# Patient Record
Sex: Male | Born: 1945
Health system: Southern US, Community
[De-identification: ages and names within clinical notes are randomized; demographics above are authoritative.]

## PROBLEM LIST (undated history)

## (undated) ENCOUNTER — Emergency Department (HOSPITAL_BASED_OUTPATIENT_CLINIC_OR_DEPARTMENT_OTHER): Admission: EM | Discharge status: Absent without leave | Payer: Medicare HMO | Source: Home / Self Care

## (undated) DIAGNOSIS — I2510 Atherosclerotic heart disease of native coronary artery without angina pectoris: Secondary | ICD-10-CM

## (undated) DIAGNOSIS — Z8719 Personal history of other diseases of the digestive system: Secondary | ICD-10-CM

## (undated) DIAGNOSIS — Z8709 Personal history of other diseases of the respiratory system: Secondary | ICD-10-CM

## (undated) DIAGNOSIS — H4910 Fourth [trochlear] nerve palsy, unspecified eye: Secondary | ICD-10-CM

## (undated) DIAGNOSIS — Z973 Presence of spectacles and contact lenses: Secondary | ICD-10-CM

## (undated) DIAGNOSIS — I10 Essential (primary) hypertension: Secondary | ICD-10-CM

## (undated) DIAGNOSIS — H4920 Sixth [abducent] nerve palsy, unspecified eye: Secondary | ICD-10-CM

## (undated) DIAGNOSIS — N529 Male erectile dysfunction, unspecified: Secondary | ICD-10-CM

## (undated) DIAGNOSIS — Z87442 Personal history of urinary calculi: Secondary | ICD-10-CM

## (undated) DIAGNOSIS — Z8619 Personal history of other infectious and parasitic diseases: Secondary | ICD-10-CM

## (undated) DIAGNOSIS — K219 Gastro-esophageal reflux disease without esophagitis: Secondary | ICD-10-CM

## (undated) DIAGNOSIS — N183 Chronic kidney disease, stage 3 unspecified: Secondary | ICD-10-CM

## (undated) DIAGNOSIS — K635 Polyp of colon: Secondary | ICD-10-CM

## (undated) DIAGNOSIS — Z859 Personal history of malignant neoplasm, unspecified: Secondary | ICD-10-CM

## (undated) DIAGNOSIS — F32A Depression, unspecified: Secondary | ICD-10-CM

## (undated) DIAGNOSIS — C4490 Unspecified malignant neoplasm of skin, unspecified: Secondary | ICD-10-CM

## (undated) DIAGNOSIS — C439 Malignant melanoma of skin, unspecified: Secondary | ICD-10-CM

## (undated) DIAGNOSIS — M5030 Other cervical disc degeneration, unspecified cervical region: Secondary | ICD-10-CM

## (undated) DIAGNOSIS — I779 Disorder of arteries and arterioles, unspecified: Secondary | ICD-10-CM

## (undated) DIAGNOSIS — I77810 Thoracic aortic ectasia: Secondary | ICD-10-CM

## (undated) DIAGNOSIS — Z9889 Other specified postprocedural states: Secondary | ICD-10-CM

## (undated) DIAGNOSIS — M159 Polyosteoarthritis, unspecified: Secondary | ICD-10-CM

## (undated) DIAGNOSIS — K449 Diaphragmatic hernia without obstruction or gangrene: Secondary | ICD-10-CM

## (undated) DIAGNOSIS — E785 Hyperlipidemia, unspecified: Secondary | ICD-10-CM

## (undated) DIAGNOSIS — C61 Malignant neoplasm of prostate: Secondary | ICD-10-CM

## (undated) DIAGNOSIS — N200 Calculus of kidney: Secondary | ICD-10-CM

## (undated) DIAGNOSIS — Z85820 Personal history of malignant melanoma of skin: Secondary | ICD-10-CM

## (undated) DIAGNOSIS — E291 Testicular hypofunction: Secondary | ICD-10-CM

## (undated) DIAGNOSIS — Z8546 Personal history of malignant neoplasm of prostate: Secondary | ICD-10-CM

## (undated) DIAGNOSIS — I4901 Ventricular fibrillation: Secondary | ICD-10-CM

## (undated) DIAGNOSIS — R001 Bradycardia, unspecified: Secondary | ICD-10-CM

## (undated) DIAGNOSIS — F329 Major depressive disorder, single episode, unspecified: Secondary | ICD-10-CM

## (undated) HISTORY — PX: OTHER SURGICAL HISTORY: SHX169

## (undated) HISTORY — DX: Malignant neoplasm of prostate: C61

## (undated) HISTORY — DX: Thoracic aortic ectasia: I77.810

## (undated) HISTORY — DX: Depression, unspecified: F32.A

## (undated) HISTORY — PX: SKIN CANCER EXCISION: SHX779

## (undated) HISTORY — PX: KIDNEY STONE SURGERY: SHX686

## (undated) HISTORY — PX: APPENDECTOMY: SHX54

## (undated) HISTORY — DX: Testicular hypofunction: E29.1

## (undated) HISTORY — DX: Sixth (abducent) nerve palsy, unspecified eye: H49.20

## (undated) HISTORY — DX: Unspecified malignant neoplasm of skin, unspecified: C44.90

## (undated) HISTORY — PX: TONSILLECTOMY: SUR1361

## (undated) HISTORY — DX: Chronic kidney disease, stage 3 unspecified: N18.30

## (undated) HISTORY — DX: Polyosteoarthritis, unspecified: M15.9

## (undated) HISTORY — DX: Bradycardia, unspecified: R00.1

## (undated) HISTORY — DX: Other cervical disc degeneration, unspecified cervical region: M50.30

## (undated) HISTORY — DX: Fourth (trochlear) nerve palsy, unspecified eye: H49.10

## (undated) HISTORY — DX: Disorder of arteries and arterioles, unspecified: I77.9

## (undated) HISTORY — DX: Calculus of kidney: N20.0

---
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
## 1969-03-24 HISTORY — PX: APPENDECTOMY: SHX54

## 2001-10-21 ENCOUNTER — Ambulatory Visit (HOSPITAL_COMMUNITY): Admission: RE | Admit: 2001-10-21 | Discharge: 2001-10-21 | Payer: Self-pay | Admitting: *Deleted

## 2001-10-21 ENCOUNTER — Encounter (INDEPENDENT_AMBULATORY_CARE_PROVIDER_SITE_OTHER): Payer: Self-pay

## 2005-11-09 ENCOUNTER — Encounter (INDEPENDENT_AMBULATORY_CARE_PROVIDER_SITE_OTHER): Payer: Self-pay | Admitting: *Deleted

## 2005-11-09 ENCOUNTER — Inpatient Hospital Stay (HOSPITAL_COMMUNITY): Admission: RE | Admit: 2005-11-09 | Discharge: 2005-11-10 | Payer: Self-pay | Admitting: Urology

## 2005-11-09 HISTORY — PX: ROBOT ASSISTED LAPAROSCOPIC RADICAL PROSTATECTOMY: SHX5141

## 2005-11-19 ENCOUNTER — Ambulatory Visit (HOSPITAL_COMMUNITY): Admission: EM | Admit: 2005-11-19 | Discharge: 2005-11-19 | Payer: Self-pay | Admitting: Emergency Medicine

## 2005-11-19 HISTORY — PX: OTHER SURGICAL HISTORY: SHX169

## 2007-07-25 DIAGNOSIS — K635 Polyp of colon: Secondary | ICD-10-CM

## 2007-07-25 HISTORY — DX: Polyp of colon: K63.5

## 2010-06-02 ENCOUNTER — Ambulatory Visit (HOSPITAL_COMMUNITY)
Admission: RE | Admit: 2010-06-02 | Discharge: 2010-06-02 | Payer: Self-pay | Source: Home / Self Care | Admitting: Urology

## 2010-06-02 HISTORY — PX: OTHER SURGICAL HISTORY: SHX169

## 2010-10-04 LAB — BASIC METABOLIC PANEL
BUN: 14 mg/dL (ref 6–23)
CO2: 24 mEq/L (ref 19–32)
Chloride: 109 mEq/L (ref 96–112)
Creatinine, Ser: 0.99 mg/dL (ref 0.4–1.5)
GFR calc Af Amer: >60 mL/min (ref >60)
GFR calc non Af Amer: >60 mL/min (ref >60)
Glucose, Bld: 89 mg/dL (ref 70–99)

## 2010-12-09 NOTE — Op Note (Signed)
NAME:  Jared Roberts, Jared Roberts NO.:  [id number]   MEDICAL RECORD NO.:  [id number]          PATIENT TYPE:  INP   LOCATION:  0002                         FACILITY:  [facility]   PHYSICIAN:  Heloise Purpura, MD      DATE OF BIRTH:  24-Apr-1946   DATE OF PROCEDURE:  11/09/2005  DATE OF DISCHARGE:                                 OPERATIVE REPORT   PREOPERATIVE DIAGNOSIS:  Clinically localized adenocarcinoma of the  prostate.   POSTOPERATIVE DIAGNOSIS:  Clinically localized adenocarcinoma of the  prostate.   PROCEDURE:  1.  Robotic-assisted laparoscopic radical prostatectomy (bilateral nerve-      sparing).  2.  Laparoscopic adhesiolysis   SURGEON:  Heloise Purpura, M.D.   ASSISTANT:  Bjorn Pippin, M.D.   ANESTHESIA:  General.   COMPLICATIONS:  None.   ESTIMATED BLOOD LOSS:  50 mL.   INTRAVENOUS FLUID:  1700 mL of lactated Ringer's.   SPECIMENS:  Prostate and seminal vesicles.   DRAINS:  1.  A 20-French coude catheter.  2.  A #19 Blake drain.   INDICATION:  Mr. Siedschlag is a 65 year old gentleman with clinical stage T2a  prostate cancer with a PSA of 3.0 and a Gleason score 3 + 3 = 6.  His  preoperative IPSS score was 5 with an IIES score of 25.  After discussing  options for clinically localized prostate cancer, the patient elected to  proceed with surgical removal.  Potential risks and benefits of the above  procedures were discussed with the patient and he consented.   DESCRIPTION OF PROCEDURE:  The patient was taken to the operating room and a  general anesthetic was administered.  He was given preoperative antibiotics,  placed in the dorsal lithotomy position, and prepped and draped in the usual  sterile fashion.  Next, a preoperative time-out was performed.  A Foley  catheter was then inserted into the bladder.  A site was selected  approximately 18 cm from the pubic symphysis and just to the left of the  umbilicus for placement of the camera port.  This was placed  using a  standard open Hasson technique.  This allowed entry into the peritoneal  cavity under direct vision.  A 12-mm port was then placed and a  pneumoperitoneum was established.  A 0-degree lens was then used to inspect  the abdomen.  There was no evidence of any intra-abdominal injuries.  There  were noted to be some fairly dense adhesions in the right lower quadrant  where the patient had a prior appendectomy.  These adhesions were not in no  way of the other port sites.  Therefore, attention was turned to placement  of the remaining ports.  Bilateral 8-mm ports were placed 16 cm from the  pubic symphysis and 10 cm lateral to the camera port.  An additional 8-mm  port was placed in the far left lateral abdominal wall.  A 5-mm port was  placed between the camera port and the right robotic port.  An additional 12-  mm port was placed in the far right lateral abdominal wall for  laparoscopic  assistance.  With laparoscopic scissors, the adhesions in the right lower  quadrant were carefully taken down from the abdominal wall, releasing the  colon.  The surgical cart was then docked.  With the aid of the hook  cautery, the bladder was reflected posteriorly, allowing entry in the space  of Retzius and identification of the endopelvic fascia and prostate.  The  endopelvic fascia was then incised from the apex back to the base of the  prostate bilaterally and the underlying levator muscle fibers were swept  laterally off the prostate.  This isolated the dorsal venous complex, which  was then stapled and divided with the 45-mm Flex ETS stapler.  The bladder  neck was then identified with the aid of Foley catheter manipulation.  The  bladder neck was entered anteriorly, allowing identification of the Foley  catheter.  The Foley catheter balloon was then deflated and the catheter was  brought into the operative field and used to retract the prostate  anteriorly.  The posterior bladder neck was  then incised.  There was noted  to be a small median lobe which was excised from the bladder neck.  Dissection continued posteriorly until the vasa deferentia and seminal  vesicles were identified.  The vasa deferentia were isolated and divided.  The seminal vesicles were isolated with care to control the seminal vesicle  arteries.  The seminal vesicles were then lifted anteriorly.  The space  between Denonvilliers' fascia and the anterior rectum was then bluntly  developed, thereby isolating the vascular pedicles of prostate.  Attention  was then turned to the anterior aspect of the prostate.  Using sharp cold  dissection, the lateral prostatic fascia was incised from the apex back to  the base of the prostate bilaterally, allowing the neurovascular bundles to  be swept laterally and posteriorly off the prostate.  The vascular pedicles  of the prostate were then ligated with Hem-o-lok clips and sharply divided.  The neurovascular bundles were preserved at the base of the prostate  bilaterally.  Attention was then turned to the urethra, which was sharply  divided, allowing the prostate to be disarticulated and placed up into the  abdominal cavity.  The pelvis was then copiously irrigated.  With irrigation  in the pelvis, air was injected into the rectal catheter and there was no  evidence of a rectal injury.  There was noted be a small arterial bleeding  site toward the distal aspect of the neurovascular bundle.  This site was  able to be controlled with a Ligaclip above the neurovascular bundle.  Attention was then turned to the urethral anastomosis.  A 2-0 Vicryl slip-  knot was placed at the 6 o'clock position between the bladder neck and  urethra and used to reapproximate these structures.  A double-armed 2-0  Monocryl suture was then used to perform a 360-degree tension-free running  anastomosis between the bladder neck and urethra.  A new 20-French coude catheter was then inserted  into the bladder and irrigated.  There were no  blood clots within the bladder and the anastomosis appeared watertight.  A  #19 Blake drain was then brought through the left robotic port and  appropriately positioned in the pelvis.  The surgical cart was then  undocked.  The Endopouch retrieval bag was placed through the periumbilical  port site to retrieve the prostate for later removal from the abdominal  cavity.  A 0 Vicryl stitch was placed through the right lateral 12-mm port  site fascia for port site closure.  The camera port incision was then  slightly extended inferiorly, allowing the prostate specimen to be removed  intact within the Endopouch retrieval bag.  This fascial opening was then  closed with a running 0 Vicryl suture.  0.25% Marcaine was then injected  into all skin incision sites, which were reapproximated at the skin level  with staples.  Sterile dressings were applied.  All sponge and needle counts  were correct x2 at the end of the procedure.  The patient appeared to  tolerate the procedure well and without complications.  He was able to be  extubated and transferred to the recovery unit in satisfactory condition.           ______________________________  Heloise Purpura, MD  Electronically Signed     LB/MEDQ  D:  11/09/2005  T:  11/10/2005  Job:  [id number]

## 2010-12-09 NOTE — Op Note (Signed)
NAME:  Jared Roberts, Jared Roberts NO.:  [id number]   MEDICAL RECORD NO.:  [id number]          PATIENT TYPE:  INP   LOCATION:  1420                         FACILITY:  [facility]   PHYSICIAN:  Courtney Paris, M.D.DATE OF BIRTH:  [birth year]   DATE OF PROCEDURE:  11/19/2005  DATE OF DISCHARGE:  11/10/2005                                 OPERATIVE REPORT   PREOPERATIVE DIAGNOSIS:  Clot urinary retention.  Postop radical  prostatectomy.   POSTOP DIAGNOSIS:  Clot urinary retention.  Postop radical prostatectomy.   OPERATIONS:  Cystoscopy, clot evacuation.   ANESTHESIA:  General.   SURGEON:  Courtney Paris, M.D.   This 65 year old patient had a radical robotic prostatectomy done by Dr.  Laverle Patter on 11/09/05 he had has catheter irrigated in the office about 6 days  ago but has continued to bleed off and on.  Came to the emergency room this  evening with his bladder full and the catheter was not draining.  I tried to  irrigate the clot but was unable to do so.  I chose to take him to the  operating room to do a clot evacuation for fear of trying not to disrupt the  recent urethral bladder and anastomosis.   The patient was placed on the operating table in dorsal lithotomy position.  After satisfactory induction of general anesthesia, he was prepped and  draped with Betadine in the usual sterile fashion.  I used the flexible  cystoscope first to examine the urethra.  The anterior urethra was normal.  The membranous urethra looked intact and the anastomosis actually looked  fairly good.  There was a little bit of clot adherent to it but there was a  large clot in the bladder.  Again looked at anastomosis.  It looked fairly  good though somewhat edematous.  I then removed the flexible cystoscope and  passed a #22 cystoscope under direct vision into the bladder and then was  able to evacuate the clot which was probably two large handfuls before I  finished.  This was done.  I  took another look with the cystoscope and the  orifices were intact.  The bladder neck was a little bit edematous but all  the clot was evacuated.  The scope was removed and #20 Foley catheter  replaced without difficulty and the irrigant was clear.  The patient was  taken to the recovery room in good condition to be later sent home today and  will see Dr. Laverle Patter tomorrow was scheduled.  Probably remove the catheter  tomorrow but I expect that he would have had passing that amount of clot  anyway.      Courtney Paris, M.D.  Electronically Signed    HMK/MEDQ  D:  11/19/2005  T:  11/20/2005  Job:  [id number]   cc:   Crecencio Mc, M.D.  Fax: [phone]

## 2010-12-09 NOTE — Procedures (Signed)
[facility]  Patient:    Jared Roberts, Jared Roberts Visit Number: [id number] MRN: [id number]          Service Type: END Location: ENDO Attending Physician:  Sabino Gasser Dictated by:   Sabino Gasser, M.D. Proc. Date: 10/21/01 Admit Date:  10/21/2001                             Procedure Report  PROCEDURE:  Upper endoscopy.  INDICATIONS:  Gastroesophageal reflux disease.  ANESTHESIA:  Demerol 50 mg, Versed 5 mg.  DESCRIPTION OF PROCEDURE:  With the patient mildly sedated in the left lateral decubitus position, the Olympus videoscopic endoscope was inserted in the mouth and passed under direct vision through the esophagus, which appeared normal. There was no evidence of Barretts. We entered into the stomach. Fundus, body, antrum, duodenal bulb, and second portion of the duodenum were visualized. From this point, the endoscope was slowly withdrawn taking circumferential views of the duodenal mucosa until the endoscope was then pulled back into the stomach and placed in retroflexion to view the stomach from below. The endoscope was then straightened and withdrawn taking circumferential views of the remaining gastric and esophageal mucosa stopping in the stomach where antral, body, and fundus mucosa was involved with an erythematous process consistent with a diffuse gastritis, which was photographed and biopsied. The patients vital signs and pulse oximeter remained stable. The patient tolerated the procedure well without apparent complications.  FINDINGS:  Changes of gastritis diffusely in stomach. Await biopsy report. The patient is to call Dr. Virginia Rochester for results and follow up with Dr. Virginia Rochester as an outpatient.  PLAN:  Proceed to colonoscopy as planned. Dictated by:   Sabino Gasser, M.D. Attending Physician:  Sabino Gasser DD:  10/21/01 TD:  10/21/01 Job: [id number]

## 2010-12-09 NOTE — Discharge Summary (Signed)
NAME:  Jared Roberts, Jared Roberts NO.:  [id number]   MEDICAL RECORD NO.:  [id number]          PATIENT TYPE:  INP   LOCATION:  1420                         FACILITY:  [facility]   PHYSICIAN:  Heloise Purpura, MD      DATE OF BIRTH:  11/09/1945   DATE OF ADMISSION:  11/09/2005  DATE OF DISCHARGE:  11/10/2005                                 DISCHARGE SUMMARY   ADMISSION DIAGNOSIS:  Clinically localized prostate cancer.   DISCHARGE DIAGNOSIS:  Clinically localized prostate cancer.   PROCEDURES:  Robotic-assisted laparoscopic radical prostatectomy.   HISTORY:  For full details, please see admission history and physical.  Briefly, Jared Roberts is a 65 year old gentleman with clinical stage T1-C  prostate cancer with a PSA of 3.0 and Gleason score of 3 + 3 = 6. After  discussing various management options for clinically localized prostate  cancer, the patient elected to proceed with surgical therapy and  particularly with robotic-assisted laparoscopic radical prostatectomy.   HOSPITAL COURSE:  The patient was admitted to the hospital on November 09, 2005. He was taken to the operating room and underwent a robotic-assisted  laparoscopic radical prostatectomy which was uneventful. Postoperatively, he  was able to be transferred to a regular hospital room following recovery  from anesthesia. Over the course of the next 24 hours, he was able to begin  ambulating which he did without difficulty. He was also able to begin a  clear liquid diet which he tolerated without problems. On the morning of  postoperative day #1, he had excellent urine output and minimal output from  his pelvic drain. His pelvic drain was therefore removed. He was able to be  transitioned to oral pain medication throughout the morning of postoperative  day #1 and met all discharge criteria by the afternoon of postoperative day  #1. He was therefore discharged in excellent condition.   DISPOSITION:  Home.   MEDICATIONS:   The patient was instructed to resume his regular home  medications excepting any aspirin, nonsteroidal anti-inflammatory drugs, or  herbal supplements for a period of 10 days. In addition, he was given a  prescription for Vicodin to take as needed for pain, Colace to use a stool  softener, and Cipro to begin one day prior to Foley catheter removal.   FOLLOW UP:  Jared Roberts has been scheduled to follow-up in approximately 7-10  days for removal of his Foley catheter, staple removal, and to go over his  surgical pathology in detail.           ______________________________  Heloise Purpura, MD  Electronically Signed     LB/MEDQ  D:  11/14/2005  T:  11/14/2005  Job:  [id number]   cc:   Soyla Murphy. Renne Crigler, M.D.  Fax: [phone]

## 2010-12-09 NOTE — H&P (Signed)
NAME:  [name], [name] NO.:  [id number]   MEDICAL RECORD NO.:  [id number]           PATIENT TYPE:   LOCATION:                                 FACILITY:   PHYSICIAN:  Courtney Paris, M.D.  DATE OF BIRTH:   DATE OF ADMISSION:  11/19/2005  DATE OF DISCHARGE:                                HISTORY & PHYSICAL   HISTORY OF PRESENT ILLNESS:  This 65 year old patient came to the emergency  room with obstructed urethral catheter nine days postoperatively radical  prostatectomy done robotically by Dr. Laverle Patter on November 09, 2005.  Dr. Laverle Patter  apparently irrigated the catheter six days ago but it has been bleeding off  an on and has not drained at all overnight.  I tried to irrigate the  catheter in the emergency room. Could not do so and I thought the safest  thing to do would be to take him to the operating room and with flexible  cystoscope get into the bladder with the guidewire, make sure I do not  disrupt the anastomosis and try to replace the catheter and possibly insert  a suprapubic tube at this time.   PAST HISTORY:  1.  Previous kidney stones.  Few were removed by ureteroscopy, this has been      many years, so the last was 12 to 13 years ago.  2.  He had also had appendectomy and tonsils done.   ALLERGIES:  He has no allergies.   MEDICATIONS:  Verapamil, lisinopril, Advicor, and multivitamins.   REVIEW OF SYSTEMS:  Nonsmoker.  No heart or lung problems.  No GI  complaints.  No arthritis.  Twelve-system review otherwise negative.   SOCIAL HISTORY:  He is married. Has two children.  A son who lives in  [place] who is 44. He has two grandchildren and a step-daughter 52 who  lives in [place], [place].  His mother is 86.  His step-dad is 87.  His real  dad died in World War II.  He has two half-brothers in good health.  He does  have an uncle with prostate cancer.  He works as a Customer service manager for a  [employer] and the prostate cancer was picked  up by a mild rise in PSA  and a nodule felt by Dr. Annabell Howells.   PHYSICAL EXAMINATION:  VITAL SIGNS:  Temperature 97, blood pressure 125/74,  respirations 20, pulse 108.  GENERAL:  He is a middle-aged, gray-haired white male with a goatee, in no  acute distress.  HEENT:  Clear.  NECK:  Supple.  LYMPH NODES:  No inguinal, cervical or axillary adenopathy.  LUNGS:  Clear.  ABDOMEN:  Soft.  Scars from his recent laparoscopic surgery noted.  GU:  Penis normal, circumcised with Foley catheter in place, 20-French but  nothing draining.  Testes normal.  No tenderness.  RECTAL:  Not done.  EXTREMITIES:  Negative.  No edema.  Good distal pulses.   IMPRESSION:  Obstructed urethral catheter, nine days post prostatectomy.  Recommend cystoscopy and replacement of catheter and possible suprapubic  tube if necessary.  Courtney Paris, M.D.  Electronically Signed     HMK/MEDQ  D:  11/19/2005  T:  11/19/2005  Job:  [id number]

## 2010-12-09 NOTE — Procedures (Signed)
[facility]  Patient:    Jared Roberts, Jared Roberts Visit Number: [id number] MRN: [id number]          Service Type: END Location: ENDO Attending Physician:  Sabino Gasser Dictated by:   Sabino Gasser, M.D. Proc. Date: 10/21/01 Admit Date:  10/21/2001                             Procedure Report  PROCEDURE:  Colonoscopy.  INDICATION:  Colon polyps.  ANESTHESIA:  Demerol 40, Versed 4 mg.  DESCRIPTION OF PROCEDURE:  With the patient mildly sedated in the left lateral decubitus position, a rectal exam was performed which was unremarkable. Subsequently, the Olympus videoscopic colonoscope was inserted into the rectum and passed under direct vision to the cecum, identified by the ileocecal valve and appendiceal orifice.  Prep in this area was slightly suboptimal in that there was stool coating the cecal mucosa which could not be fully washed and suctioned, but no gross lesions were seen.  From this point, the colonoscope was slowly withdrawn, taking circumferential views of the entire colonic mucosa, stopping at 15 cm from the anal verge, at which point a small polyp was seen, photographed, and removed using hot biopsy forceps technique setting of 20-20 blended current.  The endoscope was then placed in retroflexion to view the anal canal from above, and hemorrhoids were seen and photographed. The endoscope was straightened and withdrawn.  The patients vital signs and pulse oximeter remained stable.  The patient tolerated the procedure well without apparent complications.  FINDINGS: 1. Small polyp at 15 cm. 2. Internal hemorrhoids. 3. Otherwise unremarkable examination.  PLAN:  We will have patient call me for results of biopsy and follow up with me as as outpatient.  See endoscopy note for further details. Dictated by:   Sabino Gasser, M.D. Attending Physician:  Sabino Gasser DD:  10/21/01 TD:  10/21/01 Job: [id number]

## 2010-12-09 NOTE — H&P (Signed)
NAME:  Jared Roberts, Jared Roberts NO.:  [id number]   MEDICAL RECORD NO.:  [id number]          PATIENT TYPE:  INP   LOCATION:  NA                           FACILITY:  [facility]   PHYSICIAN:  Heloise Purpura, MD      DATE OF BIRTH:  May 22, 1946   DATE OF ADMISSION:  11/09/2005  DATE OF DISCHARGE:                                HISTORY & PHYSICAL   CHIEF COMPLAINT:  Prostate cancer.   HISTORY:  Jared Roberts is a 65 year old gentleman with clinical stage T2A  prostate cancer with a PSA of 3.0 and a Gleason score of 3+3=6. This was  diagnosed on a prostate biopsy on August 11, 2005 which demonstrated 30% of  the left apex to be involved with adenocarcinoma. After a discussion  regarding management options, the patient elected to proceed with surgical  therapy.   PAST MEDICAL HISTORY:  1.  Hypertension.  2.  Hypercholesterolemia.  3.  History of urolithiasis.   PAST SURGICAL HISTORY:  1.  Ureteroscopic stone removal.  2.  Appendectomy.   MEDICATIONS:  1.  Advicor.  2.  Verapamil ER 250 mg p.o. daily.  3.  Lisinopril 20 mg p.o. daily.  4.  Aspirin 81 mg p.o. daily.   ALLERGIES:  No known drug allergies.   FAMILY HISTORY:  The patient's father was diagnosed with prostate cancer in  his 4s and supposedly died of complications related to prostate cancer.  There is nobody else in the family who had prostate cancer. Other medical  problems in the family include coronary artery disease, diabetes,  hypertension, and urolithiasis.   SOCIAL HISTORY:  The patient works as a Patent examiner. He is  married. He denies tobacco. He does occasionally drink alcohol.   PHYSICAL EXAMINATION:  CONSTITUTIONAL:  The patient is a well-nourished,  well-developed, age appropriate male in no acute distress.  LUNGS:  Clear bilaterally.  CARDIOVASCULAR:  Regular rate and rhythm without obvious murmurs.  ABDOMEN:  Soft, nontender, nondistended.  GU:  Prostate is approximately 50 grams without  distinct nodularity. There  is however, firmness in the area of the right base.   IMPRESSION:  Clinically localized adenocarcinoma of the prostate.   PLAN:  Jared Roberts will be admitted to the hospital and will undergo a robotic  assisted laparoscopic radical prostatectomy. Postoperatively, he will  undergo routine postoperative care.           ______________________________  Heloise Purpura, MD  Electronically Signed     LB/MEDQ  D:  11/09/2005  T:  11/09/2005  Job:  [id number]

## 2013-07-15 ENCOUNTER — Other Ambulatory Visit: Payer: Self-pay | Admitting: Dermatology

## 2013-07-24 HISTORY — PX: MELANOMA EXCISION: SHX5266

## 2014-12-02 ENCOUNTER — Other Ambulatory Visit (INDEPENDENT_AMBULATORY_CARE_PROVIDER_SITE_OTHER): Payer: Self-pay | Admitting: Otolaryngology

## 2014-12-02 DIAGNOSIS — R1310 Dysphagia, unspecified: Secondary | ICD-10-CM

## 2014-12-07 ENCOUNTER — Ambulatory Visit (HOSPITAL_COMMUNITY)
Admission: RE | Admit: 2014-12-07 | Discharge: 2014-12-07 | Disposition: A | Discharge status: Home / Self Care | Payer: Medicare Other | Source: Ambulatory Visit | Attending: Otolaryngology | Admitting: Otolaryngology

## 2014-12-07 DIAGNOSIS — J029 Acute pharyngitis, unspecified: Secondary | ICD-10-CM | POA: Diagnosis not present

## 2014-12-07 DIAGNOSIS — R1310 Dysphagia, unspecified: Secondary | ICD-10-CM | POA: Diagnosis not present

## 2015-07-28 DIAGNOSIS — H0014 Chalazion left upper eyelid: Secondary | ICD-10-CM | POA: Diagnosis not present

## 2015-07-30 DIAGNOSIS — B351 Tinea unguium: Secondary | ICD-10-CM | POA: Diagnosis not present

## 2015-07-30 DIAGNOSIS — L602 Onychogryphosis: Secondary | ICD-10-CM | POA: Diagnosis not present

## 2015-08-03 DIAGNOSIS — L609 Nail disorder, unspecified: Secondary | ICD-10-CM | POA: Diagnosis not present

## 2015-08-03 DIAGNOSIS — B351 Tinea unguium: Secondary | ICD-10-CM | POA: Diagnosis not present

## 2015-08-05 DIAGNOSIS — L821 Other seborrheic keratosis: Secondary | ICD-10-CM | POA: Diagnosis not present

## 2015-08-05 DIAGNOSIS — D225 Melanocytic nevi of trunk: Secondary | ICD-10-CM | POA: Diagnosis not present

## 2015-08-05 DIAGNOSIS — L603 Nail dystrophy: Secondary | ICD-10-CM | POA: Diagnosis not present

## 2015-08-05 DIAGNOSIS — D485 Neoplasm of uncertain behavior of skin: Secondary | ICD-10-CM | POA: Diagnosis not present

## 2015-08-05 DIAGNOSIS — L820 Inflamed seborrheic keratosis: Secondary | ICD-10-CM | POA: Diagnosis not present

## 2015-08-05 DIAGNOSIS — Z85828 Personal history of other malignant neoplasm of skin: Secondary | ICD-10-CM | POA: Diagnosis not present

## 2015-08-05 DIAGNOSIS — Z85820 Personal history of malignant melanoma of skin: Secondary | ICD-10-CM | POA: Diagnosis not present

## 2015-08-05 DIAGNOSIS — Z23 Encounter for immunization: Secondary | ICD-10-CM | POA: Diagnosis not present

## 2015-08-19 DIAGNOSIS — H43811 Vitreous degeneration, right eye: Secondary | ICD-10-CM | POA: Diagnosis not present

## 2015-08-25 DIAGNOSIS — D485 Neoplasm of uncertain behavior of skin: Secondary | ICD-10-CM | POA: Diagnosis not present

## 2015-08-25 DIAGNOSIS — D2372 Other benign neoplasm of skin of left lower limb, including hip: Secondary | ICD-10-CM | POA: Diagnosis not present

## 2015-08-25 DIAGNOSIS — C4372 Malignant melanoma of left lower limb, including hip: Secondary | ICD-10-CM | POA: Diagnosis not present

## 2015-09-02 ENCOUNTER — Encounter (HOSPITAL_BASED_OUTPATIENT_CLINIC_OR_DEPARTMENT_OTHER): Payer: Self-pay | Admitting: *Deleted

## 2015-09-02 DIAGNOSIS — Z8719 Personal history of other diseases of the digestive system: Secondary | ICD-10-CM | POA: Diagnosis not present

## 2015-09-02 DIAGNOSIS — R490 Dysphonia: Secondary | ICD-10-CM | POA: Diagnosis not present

## 2015-09-02 DIAGNOSIS — D492 Neoplasm of unspecified behavior of bone, soft tissue, and skin: Secondary | ICD-10-CM | POA: Diagnosis not present

## 2015-09-02 DIAGNOSIS — J387 Other diseases of larynx: Secondary | ICD-10-CM | POA: Diagnosis not present

## 2015-09-02 DIAGNOSIS — K219 Gastro-esophageal reflux disease without esophagitis: Secondary | ICD-10-CM | POA: Diagnosis not present

## 2015-09-02 DIAGNOSIS — J383 Other diseases of vocal cords: Secondary | ICD-10-CM | POA: Diagnosis not present

## 2015-09-02 DIAGNOSIS — Z01818 Encounter for other preprocedural examination: Secondary | ICD-10-CM | POA: Diagnosis not present

## 2015-09-03 ENCOUNTER — Encounter (HOSPITAL_BASED_OUTPATIENT_CLINIC_OR_DEPARTMENT_OTHER): Payer: Self-pay | Admitting: *Deleted

## 2015-09-03 ENCOUNTER — Other Ambulatory Visit: Payer: Self-pay | Admitting: Foot & Ankle Surgery

## 2015-09-03 DIAGNOSIS — C439 Malignant melanoma of skin, unspecified: Secondary | ICD-10-CM | POA: Diagnosis not present

## 2015-09-03 NOTE — H&P (Signed)
H&P will be completed by patients primary care doctor, Dr. Shelia Media.

## 2015-09-03 NOTE — Progress Notes (Signed)
NPO AFTER MN.  ARRIVE AT 0715.  NEEDS ISTAT AND EKG.  

## 2015-09-06 ENCOUNTER — Telehealth: Payer: Self-pay | Admitting: Hematology and Oncology

## 2015-09-06 NOTE — Telephone Encounter (Signed)
Left pt vm in ref to np appt. °

## 2015-09-07 ENCOUNTER — Encounter (HOSPITAL_BASED_OUTPATIENT_CLINIC_OR_DEPARTMENT_OTHER): Payer: Self-pay | Admitting: *Deleted

## 2015-09-07 ENCOUNTER — Encounter (HOSPITAL_BASED_OUTPATIENT_CLINIC_OR_DEPARTMENT_OTHER)
Admission: RE | Disposition: A | Discharge status: Home / Self Care | Payer: Self-pay | Source: Ambulatory Visit | Attending: Foot & Ankle Surgery

## 2015-09-07 ENCOUNTER — Ambulatory Visit (HOSPITAL_BASED_OUTPATIENT_CLINIC_OR_DEPARTMENT_OTHER)
Admission: RE | Admit: 2015-09-07 | Discharge: 2015-09-07 | Disposition: A | Discharge status: Home / Self Care | Payer: Medicare HMO | Source: Ambulatory Visit | Attending: Foot & Ankle Surgery | Admitting: Foot & Ankle Surgery

## 2015-09-07 ENCOUNTER — Other Ambulatory Visit: Payer: Self-pay | Admitting: Foot & Ankle Surgery

## 2015-09-07 ENCOUNTER — Ambulatory Visit (HOSPITAL_BASED_OUTPATIENT_CLINIC_OR_DEPARTMENT_OTHER): Payer: Medicare HMO | Admitting: Anesthesiology

## 2015-09-07 DIAGNOSIS — I10 Essential (primary) hypertension: Secondary | ICD-10-CM | POA: Insufficient documentation

## 2015-09-07 DIAGNOSIS — K449 Diaphragmatic hernia without obstruction or gangrene: Secondary | ICD-10-CM | POA: Diagnosis not present

## 2015-09-07 DIAGNOSIS — C4372 Malignant melanoma of left lower limb, including hip: Secondary | ICD-10-CM | POA: Insufficient documentation

## 2015-09-07 DIAGNOSIS — K219 Gastro-esophageal reflux disease without esophagitis: Secondary | ICD-10-CM | POA: Diagnosis not present

## 2015-09-07 DIAGNOSIS — Z79899 Other long term (current) drug therapy: Secondary | ICD-10-CM | POA: Diagnosis not present

## 2015-09-07 DIAGNOSIS — L97529 Non-pressure chronic ulcer of other part of left foot with unspecified severity: Secondary | ICD-10-CM | POA: Diagnosis not present

## 2015-09-07 DIAGNOSIS — D492 Neoplasm of unspecified behavior of bone, soft tissue, and skin: Secondary | ICD-10-CM | POA: Diagnosis not present

## 2015-09-07 HISTORY — DX: Gastro-esophageal reflux disease without esophagitis: K21.9

## 2015-09-07 HISTORY — DX: Male erectile dysfunction, unspecified: N52.9

## 2015-09-07 HISTORY — DX: Essential (primary) hypertension: I10

## 2015-09-07 HISTORY — DX: Diaphragmatic hernia without obstruction or gangrene: K44.9

## 2015-09-07 HISTORY — DX: Malignant melanoma of skin, unspecified: C43.9

## 2015-09-07 HISTORY — DX: Presence of spectacles and contact lenses: Z97.3

## 2015-09-07 HISTORY — DX: Personal history of urinary calculi: Z87.442

## 2015-09-07 HISTORY — DX: Personal history of other infectious and parasitic diseases: Z86.19

## 2015-09-07 HISTORY — DX: Personal history of other diseases of the digestive system: Z87.19

## 2015-09-07 HISTORY — DX: Other specified postprocedural states: Z85.9

## 2015-09-07 HISTORY — DX: Personal history of malignant neoplasm of prostate: Z85.46

## 2015-09-07 HISTORY — DX: Personal history of malignant melanoma of skin: Z85.820

## 2015-09-07 HISTORY — DX: Hyperlipidemia, unspecified: E78.5

## 2015-09-07 HISTORY — DX: Personal history of other diseases of the respiratory system: Z87.09

## 2015-09-07 HISTORY — DX: Other specified postprocedural states: Z98.890

## 2015-09-07 HISTORY — PX: AMPUTATION TOE: SHX6595

## 2015-09-07 LAB — POCT I-STAT, CHEM 8
BUN: 13 mg/dL (ref 6–20)
CALCIUM ION: 1.21 mmol/L (ref 1.13–1.30)
Chloride: 106 mmol/L (ref 101–111)
Creatinine, Ser: 0.9 mg/dL (ref 0.61–1.24)
Glucose, Bld: 97 mg/dL (ref 65–99)
HCT: 47 % (ref 39.0–52.0)
Hemoglobin: 16 g/dL (ref 13.0–17.0)
Potassium: 3.5 mmol/L (ref 3.5–5.1)
SODIUM: 142 mmol/L (ref 135–145)
TCO2: 23 mmol/L (ref 0–100)

## 2015-09-07 SURGERY — AMPUTATION, TOE
Anesthesia: Monitor Anesthesia Care | Laterality: Left

## 2015-09-07 MED ORDER — LACTATED RINGERS IV SOLN
INTRAVENOUS | Status: DC
  Administered 2015-09-07: 08:00:00 via INTRAVENOUS
  Filled 2015-09-07: qty 1000

## 2015-09-07 MED ORDER — CEFAZOLIN SODIUM-DEXTROSE 2-3 GM-% IV SOLR
2.0000 g | INTRAVENOUS | Status: AC
  Administered 2015-09-07: 2 g via INTRAVENOUS
  Filled 2015-09-07: qty 50

## 2015-09-07 MED ORDER — MIDAZOLAM HCL 5 MG/5ML IJ SOLN
INTRAMUSCULAR | Status: DC | PRN
  Administered 2015-09-07 (×2): 1 mg via INTRAVENOUS

## 2015-09-07 MED ORDER — BUPIVACAINE-EPINEPHRINE (PF) 0.5% -1:200000 IJ SOLN
INTRAMUSCULAR | Status: AC
  Filled 2015-09-07: qty 30

## 2015-09-07 MED ORDER — LIDOCAINE HCL 2 % IJ SOLN
INTRAMUSCULAR | Status: DC | PRN
  Administered 2015-09-07: 4 mL

## 2015-09-07 MED ORDER — 0.9 % SODIUM CHLORIDE (POUR BTL) OPTIME
TOPICAL | Status: DC | PRN
  Administered 2015-09-07: 500 mL

## 2015-09-07 MED ORDER — BUPIVACAINE-EPINEPHRINE (PF) 0.5% -1:200000 IJ SOLN
INTRAMUSCULAR | Status: DC | PRN
  Administered 2015-09-07: 30 mL via PERINEURAL

## 2015-09-07 MED ORDER — ONDANSETRON HCL 4 MG/2ML IJ SOLN
INTRAMUSCULAR | Status: DC | PRN
  Administered 2015-09-07: 4 mg via INTRAVENOUS

## 2015-09-07 MED ORDER — PROPOFOL 500 MG/50ML IV EMUL
INTRAVENOUS | Status: DC | PRN
  Administered 2015-09-07: 200 ug/kg/min via INTRAVENOUS

## 2015-09-07 MED ORDER — BUPIVACAINE HCL 0.5 % IJ SOLN
INTRAMUSCULAR | Status: DC | PRN
  Administered 2015-09-07: 4 mL

## 2015-09-07 MED ORDER — DEXAMETHASONE SODIUM PHOSPHATE 4 MG/ML IJ SOLN
INTRAMUSCULAR | Status: DC | PRN
  Administered 2015-09-07: 10 mg via INTRAVENOUS

## 2015-09-07 MED ORDER — LIDOCAINE HCL 2 % IJ SOLN
INTRAMUSCULAR | Status: AC
  Filled 2015-09-07: qty 20

## 2015-09-07 MED ORDER — BUPIVACAINE HCL (PF) 0.5 % IJ SOLN
INTRAMUSCULAR | Status: AC
Start: 2015-09-07 — End: 2015-09-07
  Filled 2015-09-07: qty 30

## 2015-09-07 MED ORDER — FENTANYL CITRATE (PF) 100 MCG/2ML IJ SOLN
INTRAMUSCULAR | Status: DC | PRN
  Administered 2015-09-07: 25 ug via INTRAVENOUS

## 2015-09-07 MED ORDER — CEFAZOLIN SODIUM-DEXTROSE 2-3 GM-% IV SOLR
INTRAVENOUS | Status: AC
  Filled 2015-09-07: qty 50

## 2015-09-07 SURGICAL SUPPLY — 62 items
APL SKNCLS STERI-STRIP NONHPOA (GAUZE/BANDAGES/DRESSINGS)
BANDAGE CO FLEX L/F 2IN X 5YD (GAUZE/BANDAGES/DRESSINGS) ×2 IMPLANT
BANDAGE COBAN STERILE 2 (GAUZE/BANDAGES/DRESSINGS) ×1 IMPLANT
BANDAGE ELASTIC 6 VELCRO ST LF (GAUZE/BANDAGES/DRESSINGS) ×1 IMPLANT
BENZOIN TINCTURE PRP APPL 2/3 (GAUZE/BANDAGES/DRESSINGS) ×1 IMPLANT
BLADE AVERAGE 25X9 (BLADE) ×1 IMPLANT
BLADE FLAT COURSE (BLADE) IMPLANT
BLADE MICRO SAGITTAL (BLADE) ×1 IMPLANT
BLADE OSC/SAGITTAL MD 5.5X18 (BLADE) ×1 IMPLANT
BLADE SURG 15 STRL LF DISP TIS (BLADE) ×4 IMPLANT
BLADE SURG 15 STRL SS (BLADE) ×6
BNDG CMPR 9X4 STRL LF SNTH (GAUZE/BANDAGES/DRESSINGS) ×1
BNDG CONFORM 2 STRL LF (GAUZE/BANDAGES/DRESSINGS) ×3 IMPLANT
BNDG ESMARK 4X9 LF (GAUZE/BANDAGES/DRESSINGS) ×2 IMPLANT
CLOTH BEACON ORANGE TIMEOUT ST (SAFETY) ×1 IMPLANT
COVER BACK TABLE 60X90IN (DRAPES) ×2 IMPLANT
COVER MAYO STAND STRL (DRAPES) ×2 IMPLANT
DRAPE EXTREMITY T 121X128X90 (DRAPE) ×2 IMPLANT
DRAPE LG THREE QUARTER DISP (DRAPES) ×2 IMPLANT
DRAPE OEC MINIVIEW 54X84 (DRAPES) ×1 IMPLANT
DURAPREP 26ML APPLICATOR (WOUND CARE) ×2 IMPLANT
ELECT REM PT RETURN 9FT ADLT (ELECTROSURGICAL) ×2
ELECTRODE REM PT RTRN 9FT ADLT (ELECTROSURGICAL) ×1 IMPLANT
GAUZE SPONGE 4X4 12PLY STRL (GAUZE/BANDAGES/DRESSINGS) ×1 IMPLANT
GAUZE SPONGE 4X4 16PLY XRAY LF (GAUZE/BANDAGES/DRESSINGS) IMPLANT
GAUZE XEROFORM 1X8 LF (GAUZE/BANDAGES/DRESSINGS) ×2 IMPLANT
GLOVE BIO SURGEON STRL SZ7.5 (GLOVE) ×2 IMPLANT
GLOVE INDICATOR 7.5 STRL GRN (GLOVE) ×2 IMPLANT
GOWN STRL REUS W/TWL LRG LVL3 (GOWN DISPOSABLE) ×2 IMPLANT
KIT ROOM TURNOVER WOR (KITS) ×2 IMPLANT
KWIRE 4.0 X .045IN (WIRE) IMPLANT
LOOP VESSEL MAXI BLUE (MISCELLANEOUS) IMPLANT
NDL SAFETY ECLIPSE 18X1.5 (NEEDLE) ×1 IMPLANT
NEEDLE 27GAX1X1/2 (NEEDLE) ×2 IMPLANT
NEEDLE HYPO 18GX1.5 SHARP (NEEDLE)
NEEDLE HYPO 22GX1.5 SAFETY (NEEDLE) ×1 IMPLANT
NS IRRIG 500ML POUR BTL (IV SOLUTION) ×2 IMPLANT
PACK BASIN DAY SURGERY FS (CUSTOM PROCEDURE TRAY) ×2 IMPLANT
PADDING CAST ABS 4INX4YD NS (CAST SUPPLIES)
PADDING CAST ABS COTTON 4X4 ST (CAST SUPPLIES) ×1 IMPLANT
PENCIL BUTTON HOLSTER BLD 10FT (ELECTRODE) ×2 IMPLANT
RASP SM TEAR CROSS CUT (RASP) ×1 IMPLANT
SCOTCHCAST PLUS 5X4 WHITE (CAST SUPPLIES) IMPLANT
SPONGE GAUZE 4X4 12PLY (GAUZE/BANDAGES/DRESSINGS) ×2 IMPLANT
SPONGE LAP 4X18 X RAY DECT (DISPOSABLE) ×1 IMPLANT
STOCKINETTE 6  STRL (DRAPES) ×1
STOCKINETTE 6 STRL (DRAPES) ×1 IMPLANT
STRIP CLOSURE SKIN 1/4X4 (GAUZE/BANDAGES/DRESSINGS) ×3 IMPLANT
SUCTION FRAZIER HANDLE 10FR (MISCELLANEOUS) ×1
SUCTION TUBE FRAZIER 10FR DISP (MISCELLANEOUS) ×1 IMPLANT
SUT ETHILON 4 0 PS 2 18 (SUTURE) ×1 IMPLANT
SUT ETHILON 5 0 PS 2 18 (SUTURE) IMPLANT
SUT MNCRL AB 4-0 PS2 18 (SUTURE) ×2 IMPLANT
SUT VIC AB 3-0 SH 27 (SUTURE) ×2
SUT VIC AB 3-0 SH 27X BRD (SUTURE) ×1 IMPLANT
SUT VICRYL 4-0 PS2 18IN ABS (SUTURE) IMPLANT
SYR 3ML 18GX1 1/2 (SYRINGE) ×1 IMPLANT
SYR BULB 3OZ (MISCELLANEOUS) ×1 IMPLANT
SYR CONTROL 10ML LL (SYRINGE) ×3 IMPLANT
TAPE STRIPS DRAPE STRL (GAUZE/BANDAGES/DRESSINGS) ×1 IMPLANT
TUBE CONNECTING 12X1/4 (SUCTIONS) ×2 IMPLANT
UNDERPAD 30X30 INCONTINENT (UNDERPADS AND DIAPERS) ×2 IMPLANT

## 2015-09-07 NOTE — Discharge Instructions (Addendum)
°  Regional Anesthesia Blocks  1. Numbness or the inability to move the "blocked" extremity may last from 3-48 hours after placement. The length of time depends on the medication injected and your individual response to the medication. If the numbness is not going away after 48 hours, call your surgeon.  2. The extremity that is blocked will need to be protected until the numbness is gone and the  Strength has returned. Because you cannot feel it, you will need to take extra care to avoid injury. Because it may be weak, you may have difficulty moving it or using it. You may not know what position it is in without looking at it while the block is in effect.  3. For blocks in the legs and feet, returning to weight bearing and walking needs to be done carefully. You will need to wait until the numbness is entirely gone and the strength has returned. You should be able to move your leg and foot normally before you try and bear weight or walk. You will need someone to be with you when you first try to ensure you do not fall and possibly risk injury.  4. Bruising and tenderness at the needle site are common side effects and will resolve in a few days.  5. Persistent numbness or new problems with movement should be communicated to the surgeon or the [facility] [phone] [place] [phone]).  Post Anesthesia Home Care Instructions  Activity: Get plenty of rest for the remainder of the day. A responsible adult should stay with you for 24 hours following the procedure.  For the next 24 hours, DO NOT: -Drive a car -Paediatric nurse -Drink alcoholic beverages -Take any medication unless instructed by your physician -Make any legal decisions or sign important papers.  Meals: Start with liquid foods such as gelatin or soup. Progress to regular foods as tolerated. Avoid greasy, spicy, heavy foods. If nausea and/or vomiting occur, drink only clear liquids until the  nausea and/or vomiting subsides. Call your physician if vomiting continues.  Special Instructions/Symptoms: Your throat may feel dry or sore from the anesthesia or the breathing tube placed in your throat during surgery. If this causes discomfort, gargle with warm salt water. The discomfort should disappear within 24 hours.  If you had a scopolamine patch placed behind your ear for the management of post- operative nausea and/or vomiting:  1. The medication in the patch is effective for 72 hours, after which it should be removed.  Wrap patch in a tissue and discard in the trash. Wash hands thoroughly with soap and water. 2. You may remove the patch earlier than 72 hours if you experience unpleasant side effects which may include dry mouth, dizziness or visual disturbances. 3. Avoid touching the patch. Wash your hands with soap and water after contact with the patch.       Call your surgeon if you experience:  1.  Fever 101 or above 2.  Nausea and/or vomiting. 3.  Extreme swelling or bruising at the surgical site. 4  Continued bleeding from the incision. 5.  Increased pain, redness or drainage from the incision. 6.  Problems related to your pain medication. 7. Any change in color, movement and/or sensation 8. Any problems and/or concerns   Keep dressing clean and dry Wear wooden shoe at all times-crutches as needed Non weight bearing-heel touch down only as needed for balance Resume all medications including aspirin today Keep follow appointment on Monday

## 2015-09-07 NOTE — Anesthesia Preprocedure Evaluation (Signed)
Anesthesia Evaluation  Patient identified by MRN, date of birth, ID band Patient awake    Reviewed: Allergy & Precautions, NPO status , Patient's Chart, lab work & pertinent test results  History of Anesthesia Complications Negative for: history of anesthetic complications  Airway Mallampati: II  TM Distance: >3 FB Neck ROM: Full    Dental  (+) Teeth Intact   Pulmonary neg pulmonary ROS,    breath sounds clear to auscultation       Cardiovascular hypertension, Pt. on medications  Rhythm:Regular     Neuro/Psych  Neuromuscular disease negative psych ROS   GI/Hepatic Neg liver ROS, hiatal hernia, GERD  Controlled,  Endo/Other  negative endocrine ROS  Renal/GU negative Renal ROS     Musculoskeletal   Abdominal   Peds  Hematology negative hematology ROS (+)   Anesthesia Other Findings   Reproductive/Obstetrics                             Anesthesia Physical Anesthesia Plan  ASA: II  Anesthesia Plan: MAC and Regional   Post-op Pain Management:    Induction: Intravenous  Airway Management Planned: Nasal Cannula  Additional Equipment: None  Intra-op Plan:   Post-operative Plan:   Informed Consent: I have reviewed the patients History and Physical, chart, labs and discussed the procedure including the risks, benefits and alternatives for the proposed anesthesia with the patient or authorized representative who has indicated his/her understanding and acceptance.   Dental advisory given  Plan Discussed with: CRNA and Surgeon  Anesthesia Plan Comments:         Anesthesia Quick Evaluation

## 2015-09-07 NOTE — Transfer of Care (Signed)
Immediate Anesthesia Transfer of Care Note  Patient: Jared Roberts  Procedure(s) Performed: Procedure(s) (LRB): PARTIAL AMPUTATION TOE 4TH LEFT (Left)  Patient Location: PACU  Anesthesia Type:MAC  Level of Consciousness: awake, alert  and oriented  Airway & Oxygen Therapy: Patient Spontanous Breathing and Patient connected to nasal cannula oxygen  Post-op Assessment: Report given to PACU RN and Post -op Vital signs reviewed and stable  Post vital signs: Reviewed and stable  Complications: No apparent anesthesia complications Last Vitals:  Filed Vitals:   09/07/15 0845 09/07/15 1012  BP: 128/82 110/76  Pulse:  77  Temp:    Resp: 17 19

## 2015-09-07 NOTE — Anesthesia Procedure Notes (Addendum)
Anesthesia Regional Block:  Popliteal block  Pre-Anesthetic Checklist: ,, timeout performed, Correct Patient, Correct Site, Correct Laterality, Correct Procedure, Correct Position, site marked, Risks and benefits discussed, Surgical consent,  Pre-op evaluation,  At surgeon's request  Laterality: Lower and Left  Prep: chloraprep       Needles:  Injection technique: Single-shot  Needle Type: Echogenic Stimulator Needle          Additional Needles:  Procedures: ultrasound guided (picture in chart) and nerve stimulator Popliteal block  Nerve Stimulator or Paresthesia:  Response: plantar, 0.5 mA,   Additional Responses:   Narrative:  Injection made incrementally with aspirations every 5 mL.  Performed by: Personally  Anesthesiologist: MOSER, CHRISTOPHER  Additional Notes: H+P and labs reviewed, risks and benefits discussed with patient, procedure tolerated well without complications   Procedure Name: MAC Date/Time: 09/07/2015 9:30 AM Performed by: Mechele Claude Pre-anesthesia Checklist: Patient identified, Emergency Drugs available, Suction available, Patient being monitored and Timeout performed Patient Re-evaluated:Patient Re-evaluated prior to inductionOxygen Delivery Method: Nasal cannula Intubation Type: IV induction Placement Confirmation: positive ETCO2,  CO2 detector and breath sounds checked- equal and bilateral

## 2015-09-07 NOTE — Anesthesia Postprocedure Evaluation (Signed)
Anesthesia Post Note  Patient: Jared Roberts  Procedure(s) Performed: Procedure(s) (LRB): PARTIAL AMPUTATION TOE 4TH LEFT (Left)  Patient location during evaluation: PACU Anesthesia Type: Regional and MAC Level of consciousness: awake Pain management: pain level controlled Vital Signs Assessment: post-procedure vital signs reviewed and stable Respiratory status: spontaneous breathing Cardiovascular status: stable Postop Assessment: no signs of nausea or vomiting Anesthetic complications: no    Last Vitals:  Filed Vitals:   09/07/15 1015 09/07/15 1030  BP: 110/76 118/75  Pulse: 77 70  Temp:    Resp: 18 20    Last Pain: There were no vitals filed for this visit.               Mareon Robinette

## 2015-09-07 NOTE — Transfer of Care (Deleted)
  Filed Vitals:   09/07/15 0845 09/07/15 1012  BP: 128/82 110/76  Pulse:  77  Temp:    Resp: 17 19

## 2015-09-08 ENCOUNTER — Other Ambulatory Visit: Payer: Self-pay | Admitting: Foot & Ankle Surgery

## 2015-09-08 ENCOUNTER — Encounter (HOSPITAL_BASED_OUTPATIENT_CLINIC_OR_DEPARTMENT_OTHER): Payer: Self-pay | Admitting: Foot & Ankle Surgery

## 2015-09-08 NOTE — Brief Op Note (Signed)
09/07/2015  7:55 AM  PATIENT:  Jared Roberts  70 y.o. male  PRE-OPERATIVE DIAGNOSIS:  MELANOMA  POST-OPERATIVE DIAGNOSIS:  MELANOMA  PROCEDURE:  Procedure(s): PARTIAL AMPUTATION TOE 4TH LEFT (Left)  SURGEON:  Surgeon(s) and Role:    * [name], MD - Primary  PHYSICIAN ASSISTANT:   ASSISTANTS: none None  ANESTHESIA:   local  EBL:  Total I/O In: 990 [P.O.:240; I.V.:750] Out: -   BLOOD ADMINISTERED:none  DRAINS: none   LOCAL MEDICATIONS USED:  MARCAINE     SPECIMEN:  Source of Specimen:  Left 4th digit  DISPOSITION OF SPECIMEN:  PATHOLOGY  COUNTS:  YES  TOURNIQUET:   Total Tourniquet Time Documented: Calf (Left) - 12 minutes Total: Calf (Left) - 12 minutes   DICTATION: .Note written in EPIC  PLAN OF CARE: Discharge to home after PACU  PATIENT DISPOSITION:  PACU - guarded condition.   Delay start of Pharmacological VTE agent (>24hrs) due to surgical blood loss or risk of bleeding: not applicable

## 2015-09-08 NOTE — Op Note (Signed)
PREOPERATIVE DIAGNOSIS: Melanoma Left 4th digit POSTOPERATIVE DIAGNOSIS: SAME OPERATION: Partial left 4th digit amputation ANESTHESIA:  Local with IV sedation SURGEON: N'Tuma Sherif Millspaugh, DPM ASSISTANTS: N/A HEMOSTASIS: Left ankle tourniquet at 245mmhg ESTIMATED BLOOD LOSS: MINIMAL MATERIALS: 3-0 vicryl and 4-0 nylon  PATHOLOGY: bone   OPERATIVE PROCEDURE:  The patient was brought to the operating room and placed on the operating table in the supine position. Following IV sedation local anesthesia was obtained utilizing 8cc of a 1:1 mixture of 1% Lidocaine plain and 0.5% Marcaine plain.  The foot was then scrubbed, prepped, and draped in the usual aseptic manner.  Attention was then directed to the Left fourth digit which was noted to be necrotic tissue at the nail bed.  A fish mouth incision was made circumfrenting the digit at the Proximal Interphalangeal joint.  This incision was then sharply carried down to bone at the level of the interphalangeal joint.   The cartilage on the head of the proximal phalanx was noted to be white and viable.  The toe was then disarticulated at the proximal interphalangeal joint using a 15 blade.   The distal part of the toe was removed from the operative field and sent to pathology.   The amputation site was copiously irrigated with sterile saline and then further debrided deeply.  The amputation site was then again flushed with copious amounts of sterile saline.  Deep soft tissue was reaproximated with 3-0 Vicryl, as was superficial soft tissue.  Skin edges were reapproximated with 4-0 nylon.  The patient tolerated the procedure well and was transferred to the recovery room with all vital signs stable.

## 2015-09-09 NOTE — Addendum Note (Signed)
Addendum  created 09/09/15 1402 by Catalina Gravel, MD   Modules edited: Clinical Notes   Clinical Notes:  File: [id number]; [place]: [id number]

## 2015-09-09 NOTE — Progress Notes (Signed)
Patient  Call today 09-09-15 reguarding the foot block he received on tues 09-07-15.  MR.Eckford WAS CONCERNED THAT HIS FOOT WAS STILL NUMB FROM THE BLOCK.  DR Gifford Shave REVIEWED THE PATIENTS CHART AND STATED THAT EVERYTHING LOOKED GREAT.  HE ASKED THAT I CALL THE PATIENT AND RELAY TO HIM THAT SOMETIME IT TAKES ALITTLE LONGER FOR ALL THE NUMBNESS TO WEAR OFF.  HE ALSO ASKED THAT THE PATIENT BE INFORM HE SHOULD THE SURGEON IF IT HAS NOT GONE AWAY IN TWO MORE DAYS.

## 2015-09-09 NOTE — Progress Notes (Signed)
Patient complaint: prolonged nerve block  Asked by nurse to review chart of patient [facility].  Mr. Jakobsen is POD#2 partial left 4th digit amputation with Dr. Melony Overly.  Patient called the nurse and stated that his foot was still numb 2 days following his surgery.  He was instructed by his surgeon to call anesthesia to discuss.  I reviewed the patient's anesthetic record which included a left popliteal nerve block placed by Dr. Ermalene Postin preoperatively using 30cc of 0.5% bupivacaine with epinephrine.  [place] picture of block reviewed and satisfactory.  The surgery proceeded with a left ankle tourniquet and additional local anesthetic infiltrated by the surgeon.  No other issues noted on anesthetic record including intraop and postop course. I discussed with the nurse that the patient may have a prolonged effect from his popliteal nerve block which is a risk of peripheral nerve blockade that usually resolves with time.  I instructed the nurse to tell the patient to continue to monitor his foot numbness over the next couple of days and follow up with his surgeon if the numbness persists for a prolonged duration of time.  Collins Scotland, MD Anesthesiology

## 2015-09-13 DIAGNOSIS — C4372 Malignant melanoma of left lower limb, including hip: Secondary | ICD-10-CM | POA: Diagnosis not present

## 2015-09-14 ENCOUNTER — Telehealth: Payer: Self-pay | Admitting: Hematology and Oncology

## 2015-09-14 NOTE — Telephone Encounter (Signed)
new patient appt-s/w patient and gave np appt for 02/27 @ 2 w/Dr. Alvy Bimler Referring Dr. Melony Overly

## 2015-09-20 ENCOUNTER — Ambulatory Visit (HOSPITAL_BASED_OUTPATIENT_CLINIC_OR_DEPARTMENT_OTHER): Payer: Medicare HMO | Admitting: Hematology and Oncology

## 2015-09-20 ENCOUNTER — Encounter: Payer: Self-pay | Admitting: Hematology and Oncology

## 2015-09-20 VITALS — BP 143/92 | HR 81 | Temp 97.5°F | Resp 20 | Ht 70.0 in | Wt 182.6 lb

## 2015-09-20 DIAGNOSIS — C4372 Malignant melanoma of left lower limb, including hip: Secondary | ICD-10-CM | POA: Diagnosis not present

## 2015-09-20 DIAGNOSIS — Z8546 Personal history of malignant neoplasm of prostate: Secondary | ICD-10-CM | POA: Insufficient documentation

## 2015-09-20 NOTE — Progress Notes (Signed)
[facility] CONSULT NOTE  Patient Care Team: Deland Pretty, MD as PCP - General (Internal Medicine) [name], MD as Consulting Physician (Podiatry)  CHIEF COMPLAINTS/PURPOSE OF CONSULTATION:  Melanoma of the left toe status post amputation  HISTORY OF PRESENTING ILLNESS:  Jared Roberts 70 y.o. male is here because of recurrent diagnosis of melanoma and skin cancer. He is here accompanied by his wife. The patient has excessive sun exposure as a child and tends to burn when he was younger. He also had used suntanning booth when he was younger. Over the past few years, he had diagnosis of skin cancer affecting his hand and forehead and melanoma affecting the right ear requiring sentinel lymph node biopsy, wide local excision and skin grafting. 2 years ago, he had accidental injury to affecting the left fourth toe. Since then, he noticed abnormal growth over the nailbed which has turned black over the past 2 years. He was subsequently referred to see a podiatrist and had biopsy of the nailbed on 08/25/2015. Pathology came back positive for malignant melanoma. The depth of the biopsy cannot be adequately assessed. He was subsequently recommended surgical amputation of the left toe at the level at the proximal interphalangeal joint on 09/08/2015. Accession: [id number] surgical specimen show no residual melanoma His surgical wound is healing well with no excessive pain or bleeding He denies other new skin lesions. The patient also had history of prostate cancer status post prostatectomy. He never required adjuvant treatment after prostatectomy and his last PSA was undetectable  MEDICAL HISTORY:  Past Medical History  Diagnosis Date  . Hypertension   . Hyperlipidemia   . History of gastritis     2003  . History of Helicobacter pylori infection     2003  . GERD (gastroesophageal reflux disease)   . Hiatal hernia   . History of kidney stones   . ED (erectile dysfunction) of  organic origin   . History of prostate cancer urologist-  dr [name]    2007--  pT2a Nx Mx,  Gleason 3+3, S/P  PROSTATECTOMY  . History of squamous cell carcinoma excision     2011  left knee  . Melanoma ([place])     left 4th toe  . History of chronic bronchitis   . Wears glasses   . History of melanoma excision     2015--  right ear  . Prostate cancer ([employer])   . Skin cancer     SURGICAL HISTORY: Past Surgical History  Procedure Laterality Date  . Robot assisted laparoscopic radical prostatectomy  11-09-2005  . Cysto/  ureteroscopic stone extraction  X2  1990's  . Cysto/  clot evacuation post prostatectomy  11-19-2005  . Cysto/  removal urethral stone and urethral staples  06-02-2010  . Appendectomy  1970's  . Tonsillectomy  child  . Melanoma excision  2015    right ear  . Amputation toe Left 09/07/2015    Procedure: PARTIAL AMPUTATION TOE 4TH LEFT;  Surgeon: [name], MD;  Location: [facility];  Service: Podiatry;  Laterality: Left;    SOCIAL HISTORY: Social History   Social History  . Marital Status: Married    Spouse Name: N/A  . Number of Children: N/A  . Years of Education: N/A   Occupational History  . Not on file.   Social History Main Topics  . Smoking status: Never Smoker   . Smokeless tobacco: Never Used  . Alcohol Use: 4.2 oz/week    7 Shots  of liquor per week     Comment: 1 drink daily  . Drug Use: No  . Sexual Activity: Not on file     Comment: retired, married. 1 son   Other Topics Concern  . Not on file   Social History Narrative    FAMILY HISTORY: History reviewed. No pertinent family history.  ALLERGIES:  has No Known Allergies.  MEDICATIONS:  Current Outpatient Prescriptions  Medication Sig Dispense Refill  . aluminum hydroxide-magnesium carbonate (GAVISCON) 95-358 MG/15ML SUSP Take by mouth as needed.    Marland Kitchen ascorbic acid (VITAMIN C) 1000 MG tablet Take 1,000 mg by mouth daily.    Marland Kitchen aspirin EC 81 MG tablet Take 81 mg by  mouth daily.    Marland Kitchen atorvastatin (LIPITOR) 20 MG tablet Take 10 mg by mouth every evening.    . cholecalciferol (VITAMIN D) 1000 units tablet Take 1,000 Units by mouth daily.    . hydrochlorothiazide (HYDRODIURIL) 25 MG tablet Take 25 mg by mouth every morning.    Marland Kitchen losartan (COZAAR) 100 MG tablet Take 100 mg by mouth every morning.    . Multiple Vitamins-Minerals (CENTRUM SILVER PO) Take by mouth daily.    . verapamil (VERELAN PM) 240 MG 24 hr capsule Take 240 mg by mouth at bedtime.     No current facility-administered medications for this visit.    REVIEW OF SYSTEMS:   Constitutional: Denies fevers, chills or abnormal night sweats Eyes: Denies blurriness of vision, double vision or watery eyes Ears, nose, mouth, throat, and face: Denies mucositis or sore throat Respiratory: Denies cough, dyspnea or wheezes Cardiovascular: Denies palpitation, chest discomfort or lower extremity swelling Gastrointestinal:  Denies nausea, heartburn or change in bowel habits Skin: Denies abnormal skin rashes Lymphatics: Denies new lymphadenopathy or easy bruising Neurological:Denies numbness, tingling or new weaknesses Behavioral/Psych: Mood is stable, no new changes  All other systems were reviewed with the patient and are negative.  PHYSICAL EXAMINATION: ECOG PERFORMANCE STATUS: 0 - Asymptomatic  Filed Vitals:   09/20/15 1330  BP: 143/92  Pulse: 81  Temp: 97.5 F (36.4 C)  Resp: 20   Filed Weights   09/20/15 1330  Weight: 182 lb 9.6 oz (82.827 kg)    GENERAL:alert, no distress and comfortable SKIN: skin color, texture, turgor are normal, no rashes or significant lesions. He has extensive scars throughout his body and moles but nothing suspicious for cancer EYES: normal, conjunctiva are pink and non-injected, sclera clear OROPHARYNX:no exudate, no erythema and lips, buccal mucosa, and tongue normal  NECK: supple, thyroid normal size, non-tender, without nodularity LYMPH:  no palpable  lymphadenopathy in the cervical, axillary or inguinal LUNGS: clear to auscultation and percussion with normal breathing effort HEART: regular rate & rhythm and no murmurs and no lower extremity edema ABDOMEN:abdomen soft, non-tender and normal bowel sounds Musculoskeletal:no cyanosis of digits and no clubbing . Noted postoperative changes over the left foot PSYCH: alert & oriented x 3 with fluent speech NEURO: no focal motor/sensory deficits  LABORATORY DATA:  I have reviewed the data as listed Lab Results  Component Value Date   HGB 16.0 09/07/2015   HCT 47.0 09/07/2015    Recent Labs  09/07/15 0828  NA 142  K 3.5  CL 106  GLUCOSE 97  BUN 13  CREATININE 0.90    ASSESSMENT & PLAN:  Malignant melanoma of toe of left foot ([place]) The patient had complete resection of the affected toe. I reviewed the current guidelines with the patient. He does not require  further investigation, workup or other follow-up. I reviewed the current guidelines regarding future follow-up. I recommend complete skin exam with his dermatologist every 6-12 months indefinitely. I stressed the importance of skin protection, avoiding excessive sun exposure and wearing sunscreen  History of prostate cancer He has close follow-up with urologist group and had been cancer free for almost 10 years. I will defer to his primary care physician and urologist for future follow-up     All questions were answered. The patient knows to call the clinic with any problems, questions or concerns. I spent 30 minutes counseling the patient face to face. The total time spent in the appointment was 40 minutes and more than 50% was on counseling.     [facility], [place], MD 09/20/2015 3:44 PM

## 2015-09-20 NOTE — Assessment & Plan Note (Signed)
He has close follow-up with urologist group and had been cancer free for almost 10 years. I will defer to his primary care physician and urologist for future follow-up

## 2015-09-20 NOTE — Assessment & Plan Note (Signed)
The patient had complete resection of the affected toe. I reviewed the current guidelines with the patient. He does not require further investigation, workup or other follow-up. I reviewed the current guidelines regarding future follow-up. I recommend complete skin exam with his dermatologist every 6-12 months indefinitely. I stressed the importance of skin protection, avoiding excessive sun exposure and wearing sunscreen

## 2015-10-12 DIAGNOSIS — Z0000 Encounter for general adult medical examination without abnormal findings: Secondary | ICD-10-CM | POA: Diagnosis not present

## 2015-10-12 DIAGNOSIS — E7800 Pure hypercholesterolemia, unspecified: Secondary | ICD-10-CM | POA: Diagnosis not present

## 2015-10-12 DIAGNOSIS — Z125 Encounter for screening for malignant neoplasm of prostate: Secondary | ICD-10-CM | POA: Diagnosis not present

## 2015-10-12 DIAGNOSIS — I10 Essential (primary) hypertension: Secondary | ICD-10-CM | POA: Diagnosis not present

## 2015-10-19 DIAGNOSIS — Z8619 Personal history of other infectious and parasitic diseases: Secondary | ICD-10-CM | POA: Diagnosis not present

## 2015-10-19 DIAGNOSIS — K625 Hemorrhage of anus and rectum: Secondary | ICD-10-CM | POA: Diagnosis not present

## 2015-10-19 DIAGNOSIS — Z7982 Long term (current) use of aspirin: Secondary | ICD-10-CM | POA: Diagnosis not present

## 2015-10-19 DIAGNOSIS — E291 Testicular hypofunction: Secondary | ICD-10-CM | POA: Diagnosis not present

## 2016-01-10 DIAGNOSIS — C61 Malignant neoplasm of prostate: Secondary | ICD-10-CM | POA: Diagnosis not present

## 2016-01-14 DIAGNOSIS — Z8546 Personal history of malignant neoplasm of prostate: Secondary | ICD-10-CM | POA: Diagnosis not present

## 2016-01-24 DIAGNOSIS — L821 Other seborrheic keratosis: Secondary | ICD-10-CM | POA: Diagnosis not present

## 2016-01-24 DIAGNOSIS — D485 Neoplasm of uncertain behavior of skin: Secondary | ICD-10-CM | POA: Diagnosis not present

## 2016-01-24 DIAGNOSIS — Z85820 Personal history of malignant melanoma of skin: Secondary | ICD-10-CM | POA: Diagnosis not present

## 2016-01-24 DIAGNOSIS — Z85828 Personal history of other malignant neoplasm of skin: Secondary | ICD-10-CM | POA: Diagnosis not present

## 2016-01-24 DIAGNOSIS — D225 Melanocytic nevi of trunk: Secondary | ICD-10-CM | POA: Diagnosis not present

## 2016-04-27 DIAGNOSIS — R69 Illness, unspecified: Secondary | ICD-10-CM | POA: Diagnosis not present

## 2016-08-03 DIAGNOSIS — L821 Other seborrheic keratosis: Secondary | ICD-10-CM | POA: Diagnosis not present

## 2016-08-03 DIAGNOSIS — D2272 Melanocytic nevi of left lower limb, including hip: Secondary | ICD-10-CM | POA: Diagnosis not present

## 2016-08-03 DIAGNOSIS — Z85828 Personal history of other malignant neoplasm of skin: Secondary | ICD-10-CM | POA: Diagnosis not present

## 2016-08-03 DIAGNOSIS — Z85820 Personal history of malignant melanoma of skin: Secondary | ICD-10-CM | POA: Diagnosis not present

## 2016-08-03 DIAGNOSIS — L570 Actinic keratosis: Secondary | ICD-10-CM | POA: Diagnosis not present

## 2016-08-03 DIAGNOSIS — L817 Pigmented purpuric dermatosis: Secondary | ICD-10-CM | POA: Diagnosis not present

## 2016-08-03 DIAGNOSIS — D2371 Other benign neoplasm of skin of right lower limb, including hip: Secondary | ICD-10-CM | POA: Diagnosis not present

## 2016-08-03 DIAGNOSIS — Z23 Encounter for immunization: Secondary | ICD-10-CM | POA: Diagnosis not present

## 2016-10-18 DIAGNOSIS — I10 Essential (primary) hypertension: Secondary | ICD-10-CM | POA: Diagnosis not present

## 2016-10-18 DIAGNOSIS — Z0000 Encounter for general adult medical examination without abnormal findings: Secondary | ICD-10-CM | POA: Diagnosis not present

## 2016-10-18 DIAGNOSIS — E7800 Pure hypercholesterolemia, unspecified: Secondary | ICD-10-CM | POA: Diagnosis not present

## 2016-10-18 DIAGNOSIS — Z125 Encounter for screening for malignant neoplasm of prostate: Secondary | ICD-10-CM | POA: Diagnosis not present

## 2016-10-18 DIAGNOSIS — E559 Vitamin D deficiency, unspecified: Secondary | ICD-10-CM | POA: Diagnosis not present

## 2016-10-23 DIAGNOSIS — M5030 Other cervical disc degeneration, unspecified cervical region: Secondary | ICD-10-CM | POA: Diagnosis not present

## 2016-10-23 DIAGNOSIS — Z1212 Encounter for screening for malignant neoplasm of rectum: Secondary | ICD-10-CM | POA: Diagnosis not present

## 2016-10-23 DIAGNOSIS — H5369 Other night blindness: Secondary | ICD-10-CM | POA: Diagnosis not present

## 2016-10-23 DIAGNOSIS — Z0001 Encounter for general adult medical examination with abnormal findings: Secondary | ICD-10-CM | POA: Diagnosis not present

## 2016-10-23 DIAGNOSIS — Z789 Other specified health status: Secondary | ICD-10-CM | POA: Diagnosis not present

## 2016-11-21 DIAGNOSIS — H5213 Myopia, bilateral: Secondary | ICD-10-CM | POA: Diagnosis not present

## 2016-11-21 DIAGNOSIS — H2513 Age-related nuclear cataract, bilateral: Secondary | ICD-10-CM | POA: Diagnosis not present

## 2016-12-06 DIAGNOSIS — T63301A Toxic effect of unspecified spider venom, accidental (unintentional), initial encounter: Secondary | ICD-10-CM | POA: Diagnosis not present

## 2017-01-09 DIAGNOSIS — R69 Illness, unspecified: Secondary | ICD-10-CM | POA: Diagnosis not present

## 2017-03-07 DIAGNOSIS — L304 Erythema intertrigo: Secondary | ICD-10-CM | POA: Diagnosis not present

## 2017-03-07 DIAGNOSIS — S20419A Abrasion of unspecified back wall of thorax, initial encounter: Secondary | ICD-10-CM | POA: Diagnosis not present

## 2017-03-07 DIAGNOSIS — D485 Neoplasm of uncertain behavior of skin: Secondary | ICD-10-CM | POA: Diagnosis not present

## 2017-03-07 DIAGNOSIS — L219 Seborrheic dermatitis, unspecified: Secondary | ICD-10-CM | POA: Diagnosis not present

## 2017-03-07 DIAGNOSIS — Z85820 Personal history of malignant melanoma of skin: Secondary | ICD-10-CM | POA: Diagnosis not present

## 2017-03-07 DIAGNOSIS — Z85828 Personal history of other malignant neoplasm of skin: Secondary | ICD-10-CM | POA: Diagnosis not present

## 2017-03-07 DIAGNOSIS — L820 Inflamed seborrheic keratosis: Secondary | ICD-10-CM | POA: Diagnosis not present

## 2017-03-07 DIAGNOSIS — D2271 Melanocytic nevi of right lower limb, including hip: Secondary | ICD-10-CM | POA: Diagnosis not present

## 2017-03-07 DIAGNOSIS — L821 Other seborrheic keratosis: Secondary | ICD-10-CM | POA: Diagnosis not present

## 2017-04-12 DIAGNOSIS — R69 Illness, unspecified: Secondary | ICD-10-CM | POA: Diagnosis not present

## 2017-08-01 DIAGNOSIS — R69 Illness, unspecified: Secondary | ICD-10-CM | POA: Diagnosis not present

## 2017-09-04 DIAGNOSIS — L821 Other seborrheic keratosis: Secondary | ICD-10-CM | POA: Diagnosis not present

## 2017-09-04 DIAGNOSIS — Z85828 Personal history of other malignant neoplasm of skin: Secondary | ICD-10-CM | POA: Diagnosis not present

## 2017-09-04 DIAGNOSIS — Z23 Encounter for immunization: Secondary | ICD-10-CM | POA: Diagnosis not present

## 2017-09-04 DIAGNOSIS — Z85820 Personal history of malignant melanoma of skin: Secondary | ICD-10-CM | POA: Diagnosis not present

## 2017-09-04 DIAGNOSIS — D2261 Melanocytic nevi of right upper limb, including shoulder: Secondary | ICD-10-CM | POA: Diagnosis not present

## 2017-09-04 DIAGNOSIS — D2271 Melanocytic nevi of right lower limb, including hip: Secondary | ICD-10-CM | POA: Diagnosis not present

## 2017-10-23 DIAGNOSIS — I10 Essential (primary) hypertension: Secondary | ICD-10-CM | POA: Diagnosis not present

## 2017-10-23 DIAGNOSIS — Z125 Encounter for screening for malignant neoplasm of prostate: Secondary | ICD-10-CM | POA: Diagnosis not present

## 2017-10-26 DIAGNOSIS — N5234 Erectile dysfunction following simple prostatectomy: Secondary | ICD-10-CM | POA: Diagnosis not present

## 2017-10-26 DIAGNOSIS — M5030 Other cervical disc degeneration, unspecified cervical region: Secondary | ICD-10-CM | POA: Diagnosis not present

## 2017-10-26 DIAGNOSIS — M2550 Pain in unspecified joint: Secondary | ICD-10-CM | POA: Diagnosis not present

## 2017-10-26 DIAGNOSIS — Z789 Other specified health status: Secondary | ICD-10-CM | POA: Diagnosis not present

## 2017-10-26 DIAGNOSIS — Z7982 Long term (current) use of aspirin: Secondary | ICD-10-CM | POA: Diagnosis not present

## 2017-10-26 DIAGNOSIS — Z1212 Encounter for screening for malignant neoplasm of rectum: Secondary | ICD-10-CM | POA: Diagnosis not present

## 2017-10-26 DIAGNOSIS — E782 Mixed hyperlipidemia: Secondary | ICD-10-CM | POA: Diagnosis not present

## 2017-10-26 DIAGNOSIS — M150 Primary generalized (osteo)arthritis: Secondary | ICD-10-CM | POA: Diagnosis not present

## 2017-10-26 DIAGNOSIS — I10 Essential (primary) hypertension: Secondary | ICD-10-CM | POA: Diagnosis not present

## 2017-10-26 DIAGNOSIS — Z0001 Encounter for general adult medical examination with abnormal findings: Secondary | ICD-10-CM | POA: Diagnosis not present

## 2017-10-26 DIAGNOSIS — K219 Gastro-esophageal reflux disease without esophagitis: Secondary | ICD-10-CM | POA: Diagnosis not present

## 2017-11-20 DIAGNOSIS — L602 Onychogryphosis: Secondary | ICD-10-CM | POA: Diagnosis not present

## 2017-11-20 DIAGNOSIS — D485 Neoplasm of uncertain behavior of skin: Secondary | ICD-10-CM | POA: Diagnosis not present

## 2017-11-20 DIAGNOSIS — L851 Acquired keratosis [keratoderma] palmaris et plantaris: Secondary | ICD-10-CM | POA: Diagnosis not present

## 2017-11-20 DIAGNOSIS — Z89429 Acquired absence of other toe(s), unspecified side: Secondary | ICD-10-CM | POA: Diagnosis not present

## 2017-11-23 DIAGNOSIS — D485 Neoplasm of uncertain behavior of skin: Secondary | ICD-10-CM | POA: Diagnosis not present

## 2017-11-23 DIAGNOSIS — H5213 Myopia, bilateral: Secondary | ICD-10-CM | POA: Diagnosis not present

## 2017-11-23 DIAGNOSIS — H2513 Age-related nuclear cataract, bilateral: Secondary | ICD-10-CM | POA: Diagnosis not present

## 2017-11-27 DIAGNOSIS — J309 Allergic rhinitis, unspecified: Secondary | ICD-10-CM | POA: Diagnosis not present

## 2017-11-27 DIAGNOSIS — E7800 Pure hypercholesterolemia, unspecified: Secondary | ICD-10-CM | POA: Diagnosis not present

## 2017-11-27 DIAGNOSIS — J029 Acute pharyngitis, unspecified: Secondary | ICD-10-CM | POA: Diagnosis not present

## 2017-11-27 DIAGNOSIS — R5383 Other fatigue: Secondary | ICD-10-CM | POA: Diagnosis not present

## 2017-11-27 DIAGNOSIS — J329 Chronic sinusitis, unspecified: Secondary | ICD-10-CM | POA: Diagnosis not present

## 2017-11-28 DIAGNOSIS — D2371 Other benign neoplasm of skin of right lower limb, including hip: Secondary | ICD-10-CM | POA: Diagnosis not present

## 2017-12-05 DIAGNOSIS — J019 Acute sinusitis, unspecified: Secondary | ICD-10-CM | POA: Diagnosis not present

## 2017-12-19 DIAGNOSIS — E559 Vitamin D deficiency, unspecified: Secondary | ICD-10-CM | POA: Diagnosis not present

## 2017-12-19 DIAGNOSIS — R635 Abnormal weight gain: Secondary | ICD-10-CM | POA: Diagnosis not present

## 2017-12-19 DIAGNOSIS — R6889 Other general symptoms and signs: Secondary | ICD-10-CM | POA: Diagnosis not present

## 2017-12-19 DIAGNOSIS — I10 Essential (primary) hypertension: Secondary | ICD-10-CM | POA: Diagnosis not present

## 2017-12-19 DIAGNOSIS — R0602 Shortness of breath: Secondary | ICD-10-CM | POA: Diagnosis not present

## 2017-12-19 DIAGNOSIS — M2550 Pain in unspecified joint: Secondary | ICD-10-CM | POA: Diagnosis not present

## 2017-12-19 DIAGNOSIS — R5383 Other fatigue: Secondary | ICD-10-CM | POA: Diagnosis not present

## 2017-12-26 ENCOUNTER — Other Ambulatory Visit: Payer: Self-pay | Admitting: Cardiology

## 2017-12-26 DIAGNOSIS — R0609 Other forms of dyspnea: Secondary | ICD-10-CM | POA: Diagnosis not present

## 2017-12-26 DIAGNOSIS — R5383 Other fatigue: Secondary | ICD-10-CM | POA: Diagnosis not present

## 2017-12-26 DIAGNOSIS — I10 Essential (primary) hypertension: Secondary | ICD-10-CM | POA: Diagnosis not present

## 2017-12-26 DIAGNOSIS — Z0189 Encounter for other specified special examinations: Secondary | ICD-10-CM | POA: Diagnosis not present

## 2017-12-26 DIAGNOSIS — R5381 Other malaise: Secondary | ICD-10-CM | POA: Diagnosis not present

## 2017-12-26 DIAGNOSIS — F05 Delirium due to known physiological condition: Secondary | ICD-10-CM

## 2017-12-27 DIAGNOSIS — R5383 Other fatigue: Secondary | ICD-10-CM | POA: Diagnosis not present

## 2017-12-27 DIAGNOSIS — I10 Essential (primary) hypertension: Secondary | ICD-10-CM | POA: Diagnosis not present

## 2017-12-28 DIAGNOSIS — I10 Essential (primary) hypertension: Secondary | ICD-10-CM | POA: Diagnosis not present

## 2017-12-28 DIAGNOSIS — R0609 Other forms of dyspnea: Secondary | ICD-10-CM | POA: Diagnosis not present

## 2017-12-28 DIAGNOSIS — E782 Mixed hyperlipidemia: Secondary | ICD-10-CM | POA: Diagnosis not present

## 2017-12-29 ENCOUNTER — Ambulatory Visit
Admission: RE | Admit: 2017-12-29 | Discharge: 2017-12-29 | Disposition: A | Discharge status: Home / Self Care | Payer: Medicare HMO | Source: Ambulatory Visit | Attending: Cardiology | Admitting: Cardiology

## 2017-12-29 DIAGNOSIS — R410 Disorientation, unspecified: Secondary | ICD-10-CM | POA: Diagnosis not present

## 2017-12-29 DIAGNOSIS — F05 Delirium due to known physiological condition: Secondary | ICD-10-CM

## 2018-01-01 DIAGNOSIS — I10 Essential (primary) hypertension: Secondary | ICD-10-CM | POA: Diagnosis not present

## 2018-01-01 DIAGNOSIS — R0609 Other forms of dyspnea: Secondary | ICD-10-CM | POA: Diagnosis not present

## 2018-01-02 ENCOUNTER — Other Ambulatory Visit: Payer: Self-pay | Admitting: Cardiology

## 2018-01-02 DIAGNOSIS — F4489 Other dissociative and conversion disorders: Secondary | ICD-10-CM

## 2018-01-07 ENCOUNTER — Other Ambulatory Visit: Payer: Self-pay | Admitting: Ophthalmology

## 2018-01-07 DIAGNOSIS — D485 Neoplasm of uncertain behavior of skin: Secondary | ICD-10-CM | POA: Diagnosis not present

## 2018-01-07 DIAGNOSIS — C44111 Basal cell carcinoma of skin of unspecified eyelid, including canthus: Secondary | ICD-10-CM | POA: Diagnosis not present

## 2018-01-10 DIAGNOSIS — I509 Heart failure, unspecified: Secondary | ICD-10-CM | POA: Diagnosis not present

## 2018-01-14 DIAGNOSIS — I509 Heart failure, unspecified: Secondary | ICD-10-CM | POA: Diagnosis not present

## 2018-01-23 DIAGNOSIS — R0609 Other forms of dyspnea: Secondary | ICD-10-CM | POA: Diagnosis not present

## 2018-01-23 DIAGNOSIS — G4734 Idiopathic sleep related nonobstructive alveolar hypoventilation: Secondary | ICD-10-CM | POA: Diagnosis not present

## 2018-01-23 DIAGNOSIS — R5381 Other malaise: Secondary | ICD-10-CM | POA: Diagnosis not present

## 2018-01-23 DIAGNOSIS — R5383 Other fatigue: Secondary | ICD-10-CM | POA: Diagnosis not present

## 2018-02-01 DIAGNOSIS — H903 Sensorineural hearing loss, bilateral: Secondary | ICD-10-CM | POA: Diagnosis not present

## 2018-02-01 DIAGNOSIS — H9313 Tinnitus, bilateral: Secondary | ICD-10-CM | POA: Diagnosis not present

## 2018-02-01 DIAGNOSIS — R42 Dizziness and giddiness: Secondary | ICD-10-CM | POA: Diagnosis not present

## 2018-02-05 DIAGNOSIS — R69 Illness, unspecified: Secondary | ICD-10-CM | POA: Diagnosis not present

## 2018-02-11 DIAGNOSIS — R2990 Unspecified symptoms and signs involving the nervous system: Secondary | ICD-10-CM | POA: Diagnosis not present

## 2018-02-11 DIAGNOSIS — R51 Headache: Secondary | ICD-10-CM | POA: Diagnosis not present

## 2018-02-11 DIAGNOSIS — R42 Dizziness and giddiness: Secondary | ICD-10-CM | POA: Diagnosis not present

## 2018-02-11 DIAGNOSIS — R4189 Other symptoms and signs involving cognitive functions and awareness: Secondary | ICD-10-CM | POA: Diagnosis not present

## 2018-02-11 DIAGNOSIS — R413 Other amnesia: Secondary | ICD-10-CM | POA: Diagnosis not present

## 2018-02-11 DIAGNOSIS — H539 Unspecified visual disturbance: Secondary | ICD-10-CM | POA: Diagnosis not present

## 2018-02-11 DIAGNOSIS — R5383 Other fatigue: Secondary | ICD-10-CM | POA: Diagnosis not present

## 2018-02-13 ENCOUNTER — Ambulatory Visit (INDEPENDENT_AMBULATORY_CARE_PROVIDER_SITE_OTHER): Payer: Medicare HMO | Admitting: Neurology

## 2018-02-13 ENCOUNTER — Encounter: Payer: Self-pay | Admitting: Neurology

## 2018-02-13 VITALS — BP 147/91 | HR 83 | Ht 70.0 in | Wt 186.5 lb

## 2018-02-13 DIAGNOSIS — R799 Abnormal finding of blood chemistry, unspecified: Secondary | ICD-10-CM | POA: Diagnosis not present

## 2018-02-13 DIAGNOSIS — E559 Vitamin D deficiency, unspecified: Secondary | ICD-10-CM | POA: Diagnosis not present

## 2018-02-13 DIAGNOSIS — R410 Disorientation, unspecified: Secondary | ICD-10-CM

## 2018-02-13 DIAGNOSIS — E538 Deficiency of other specified B group vitamins: Secondary | ICD-10-CM | POA: Diagnosis not present

## 2018-02-13 DIAGNOSIS — R2689 Other abnormalities of gait and mobility: Secondary | ICD-10-CM | POA: Diagnosis not present

## 2018-02-13 NOTE — Progress Notes (Signed)
PATIENT: Jared Roberts DOB: 1945-12-23  Chief Complaint  Patient presents with  . New Patient (Initial Visit)    PCP: Dr. Deland Roberts. Patient here with wife, Jared Roberts.   . Memory Loss    MMSE: 28/30, Animals: 18     HISTORICAL  Jared Roberts is a 72 year old male, accompanied by his wife Jared Roberts, seen in request by his primary care physician Dr. Shelia Roberts, Jared Roberts, for evaluation of memory loss, initial evaluation was on February 13, 2018.  I have reviewed and summarized the referring note, he had a past medical history of hypertension, lipidemia, melanoma of the ear, hyperplastic colon polyps, cervical degenerative disc disease  In April 2019, he suffered upper respiratory infection, concurrent with his upper respiratory symptoms, he also noticed mild dizziness, confusion, when he moved quickly, or staring at a moving visual object, he felt dizziness, difficulty focusing, need time to refocus, at the same time, he felt mild confusion, need to read few times to understand, sometimes tends to misinterpreted the message.  He had an argument with his wife about the article that they have read, his talk about hand dryer, in his mind, he was thinking about handhold hairdryer,  Overall his symptoms has improved over the past 3 months, there are fluctuations, some days are better than the other, in his worst days, he does not have energy to do anything, just want to sit at his recliner, backing off his vehicle from driveway, by looking at the mirrors, turning his head few times quickly, he felt dizzy, "too much to handle".  He denies depression, no lateralized motor or sensory deficit, mild pressure sensation behind his eyes, and sinus,  We personally reviewed MRI of the brain in June 2019 generalized age-appropriate atrophy, Ultrasound of carotid arteries pending  He was seen by cardiologist Dr. Einar Roberts, cardiac work-up showed no significant abnormality.  He has been a lifelong light sleeper, easily to be  awakening, disturbed during his sleep, but he has no snoring, no significant daytime sleepiness, he was referred to sleep study.  Laboratory evaluations normal CBC hemoglobin of 16.9, creatinine of 1.1, LDL of 72, B12,    REVIEW OF SYSTEMS: Full 14 system review of systems performed and notable only for decreased energy, memory loss, confusion, headaches, dizziness, tremor, sleepiness, snoring, joint pain, aching muscles, runny nose, shortness of breath, fatigue, hearing loss, ringing the ears, itching  ALLERGIES: No Known Allergies  HOME MEDICATIONS: Current Outpatient Medications  Medication Sig Dispense Refill  . ascorbic acid (VITAMIN C) 1000 MG tablet Take 1,000 mg by mouth daily.    . cholecalciferol (VITAMIN D) 1000 units tablet Take 1,000 Units by mouth daily.    . hydrochlorothiazide (HYDRODIURIL) 25 MG tablet Take 25 mg by mouth every morning.    Marland Kitchen ibuprofen (ADVIL,MOTRIN) 200 MG tablet Take 200 mg by mouth every 6 (six) hours as needed.    Marland Kitchen losartan (COZAAR) 100 MG tablet Take 100 mg by mouth every morning.    . Multiple Vitamins-Minerals (CENTRUM SILVER PO) Take by mouth daily.    . Probiotic Product (PROBIOTIC DAILY PO) Take by mouth.    . rosuvastatin (CRESTOR) 5 MG tablet Take 1 tablet by mouth daily.    . verapamil (VERELAN PM) 240 MG 24 hr capsule Take 240 mg by mouth at bedtime.     No current facility-administered medications for this visit.     PAST MEDICAL HISTORY: Past Medical History:  Diagnosis Date  . ED (erectile dysfunction) of organic  origin   . GERD (gastroesophageal reflux disease)   . Hiatal hernia   . History of chronic bronchitis   . History of gastritis    2003  . History of Helicobacter pylori infection    2003  . History of kidney stones   . History of melanoma excision    2015--  right ear  . History of prostate cancer urologist-  dr [name]   2007--  pT2a Nx Mx,  Gleason 3+3, S/P  PROSTATECTOMY  . History of squamous cell carcinoma  excision    2011  left knee  . Hyperlipidemia   . Hypertension   . Melanoma ([place])    left 4th toe  . Prostate cancer ([place])   . Skin cancer   . Wears glasses     PAST SURGICAL HISTORY: Past Surgical History:  Procedure Laterality Date  . AMPUTATION TOE Left 09/07/2015   Procedure: PARTIAL AMPUTATION TOE 4TH LEFT;  Surgeon: [name], MD;  Location: [facility];  Service: Podiatry;  Laterality: Left;  . APPENDECTOMY  1970's  . CYSTO/  CLOT EVACUATION POST PROSTATECTOMY  11-19-2005  . CYSTO/  REMOVAL URETHRAL STONE AND URETHRAL STAPLES  06-02-2010  . CYSTO/  URETEROSCOPIC STONE EXTRACTION  X2  1990's  . MELANOMA EXCISION  2015   right ear  . ROBOT ASSISTED LAPAROSCOPIC RADICAL PROSTATECTOMY  11-09-2005  . TONSILLECTOMY  child    FAMILY HISTORY: History reviewed. No pertinent family history.  SOCIAL HISTORY: Social History   Socioeconomic History  . Marital status: Married    Spouse name: Not on file  . Number of children: Not on file  . Years of education: Not on file  . Highest education level: Not on file  Occupational History  . Not on file  Social Needs  . Financial resource strain: Not on file  . Food insecurity:    Worry: Not on file    Inability: Not on file  . Transportation needs:    Medical: Not on file    Non-medical: Not on file  Tobacco Use  . Smoking status: Never Smoker  . Smokeless tobacco: Never Used  Substance and Sexual Activity  . Alcohol use: Yes    Alcohol/week: 4.2 oz    Types: 7 Shots of liquor per week    Comment: 1 drink daily  . Drug use: No  . Sexual activity: Not on file    Comment: retired, married. 1 son  Lifestyle  . Physical activity:    Days per week: Not on file    Minutes per session: Not on file  . Stress: Not on file  Relationships  . Social connections:    Talks on phone: Not on file    Gets together: Not on file    Attends religious service: Not on file    Active member of club or organization:  Not on file    Attends meetings of clubs or organizations: Not on file    Relationship status: Not on file  . Intimate partner violence:    Fear of current or ex partner: Not on file    Emotionally abused: Not on file    Physically abused: Not on file    Forced sexual activity: Not on file  Other Topics Concern  . Not on file  Social History Narrative  . Not on file     PHYSICAL EXAM   Vitals:   02/13/18 1025  BP: (!) 147/91  Pulse: 83  Weight: 186 lb 8  oz (84.6 kg)  Height: 5\' 10"  (1.778 m)    Not recorded      Body mass index is 26.76 kg/m.  PHYSICAL EXAMNIATION:  Gen: NAD, conversant, well nourised, obese, well groomed                     Cardiovascular: Regular rate rhythm, no peripheral edema, warm, nontender. Eyes: Conjunctivae clear without exudates or hemorrhage Neck: Supple, no carotid bruits. Pulmonary: Clear to auscultation bilaterally   NEUROLOGICAL EXAM: MMSE - Mini Mental State Exam 02/13/2018  Orientation to time 5  Orientation to Place 5  Registration 3  Attention/ Calculation 3  Recall 3  Language- name 2 objects 2  Language- repeat 1  Language- follow 3 step command 3  Language- read & follow direction 1  Write a sentence 1  Copy design 1  Total score 28  Animal naming 18   CRANIAL NERVES: CN II: Visual fields are full to confrontation. Fundoscopic exam is normal with sharp discs and no vascular changes. Pupils are round equal and briskly reactive to light. CN III, IV, VI: extraocular movement are normal. No ptosis. CN V: Facial sensation is intact to pinprick in all 3 divisions bilaterally. Corneal responses are intact.  CN VII: Face is symmetric with normal eye closure and smile. CN VIII: Hearing is normal to rubbing fingers CN IX, X: Palate elevates symmetrically. Phonation is normal. CN XI: Head turning and shoulder shrug are intact CN XII: Tongue is midline with normal movements and no atrophy.  MOTOR: There is no pronator drift  of out-stretched arms. Muscle bulk and tone are normal. Muscle strength is normal.  REFLEXES: Reflexes are 2+ and symmetric at the biceps, triceps, knees, and ankles. Plantar responses are flexor.  SENSORY: Intact to light touch, pinprick, positional sensation and vibratory sensation are intact in fingers and toes.  COORDINATION: Rapid alternating movements and fine finger movements are intact. There is no dysmetria on finger-to-nose and heel-knee-shin.    GAIT/STANCE: Posture is normal. Gait is steady with normal steps, base, arm swing, and turning. Heel and toe walking are normal. Tandem gait is normal.  Romberg is absent.   DIAGNOSTIC DATA (LABS, IMAGING, TESTING) - I reviewed patient records, labs, notes, testing and imaging myself where available.   ASSESSMENT AND PLAN  Jared Roberts is a 72 y.o. male   3 months history of intermittent confusion, overall improved,  Normal MRI of brain  Differentiation diagnosis: Deconditioning, medication side effect, need to rule out inflammatory process, nutritional deficiency,  Laboratory evaluations  EEG  Continue to observe his symptoms,   Marcial Pacas, M.D. Ph.D.  [facility] [address] [place], [place] [address] Ph: [phone] Fax: [phone]  CC: Jared Pretty, MD

## 2018-02-14 DIAGNOSIS — R42 Dizziness and giddiness: Secondary | ICD-10-CM | POA: Diagnosis not present

## 2018-02-14 DIAGNOSIS — R51 Headache: Secondary | ICD-10-CM | POA: Diagnosis not present

## 2018-02-14 LAB — ANA W/REFLEX IF POSITIVE: ANA: NEGATIVE

## 2018-02-14 LAB — VITAMIN D 25 HYDROXY (VIT D DEFICIENCY, FRACTURES): Vit D, 25-Hydroxy: 43 ng/mL (ref 30.0–100.0)

## 2018-02-14 LAB — FOLATE: Folate: >20 ng/mL (ref >3.0)

## 2018-02-14 LAB — TSH: TSH: 3.52 u[IU]/mL (ref 0.450–4.500)

## 2018-02-14 LAB — SEDIMENTATION RATE: SED RATE: 4 mm/h (ref 0–30)

## 2018-02-14 LAB — C-REACTIVE PROTEIN: CRP: <1 mg/L (ref 0–10)

## 2018-02-14 LAB — B. BURGDORFI ANTIBODIES

## 2018-02-19 ENCOUNTER — Ambulatory Visit: Payer: Medicare HMO | Admitting: Neurology

## 2018-02-19 DIAGNOSIS — R410 Disorientation, unspecified: Secondary | ICD-10-CM | POA: Diagnosis not present

## 2018-02-27 NOTE — Procedures (Signed)
   HISTORY: 72 years old male, with history of memory loss, confusion spells  TECHNIQUE:  16 channel EEG was performed based on standard 10-16 international system. One channel was dedicated to EKG, which has demonstrates normal sinus rhythm of 78 beats per minutes.  Upon awakening, the posterior background activity was well-developed, in alpha range, with amplitude of microvoltage, reactive to eye opening and closure.  There was no evidence of epileptiform discharge.  Photic stimulation was performed, which induced a symmetric photic driving.  Hyperventilation was performed, there was no abnormality elicit.  No sleep was achieved.  CONCLUSION: This is a  normal awake EEG.  There is no electrodiagnostic evidence of epileptiform discharge.  Marcial Pacas, M.D. Ph.D.  [facility] [place], [place] [address] Phone: [phone] Fax:      [phone]

## 2018-03-21 DIAGNOSIS — C441192 Basal cell carcinoma of skin of left lower eyelid, including canthus: Secondary | ICD-10-CM | POA: Diagnosis not present

## 2018-03-27 ENCOUNTER — Encounter: Payer: Self-pay | Admitting: Neurology

## 2018-03-27 ENCOUNTER — Ambulatory Visit (INDEPENDENT_AMBULATORY_CARE_PROVIDER_SITE_OTHER): Payer: Medicare HMO | Admitting: Neurology

## 2018-03-27 VITALS — BP 129/91 | HR 91 | Ht 70.0 in | Wt 185.0 lb

## 2018-03-27 DIAGNOSIS — G933 Postviral fatigue syndrome: Secondary | ICD-10-CM | POA: Diagnosis not present

## 2018-03-27 DIAGNOSIS — G9331 Postviral fatigue syndrome: Secondary | ICD-10-CM | POA: Insufficient documentation

## 2018-03-27 DIAGNOSIS — G4719 Other hypersomnia: Secondary | ICD-10-CM | POA: Diagnosis not present

## 2018-03-27 DIAGNOSIS — R0683 Snoring: Secondary | ICD-10-CM | POA: Diagnosis not present

## 2018-03-27 NOTE — Progress Notes (Addendum)
SLEEP MEDICINE CLINIC   Provider:  Larey Seat, M.D.   Primary Care Physician:  Deland Pretty, MD   Referring Provider: Dr. Einar Gip, MD , Cardiology    Chief Complaint  Patient presents with  . New Patient (Initial Visit)    pt with wife, ONO was completed with cardiologist and oxygen level dropped <89 during the night. unable to sleep through the night soundly. snores intermittently, complains of feeling tired during the daytime     HPI:  Jared Roberts is a 72 y.o. caucasian male patient that only recenlty has seen Dr. Krista Blue here at [facility] but is seen here on 03-27-2018  in a referral from Dr. Einar Gip, MD.  I had the pleasure of meeting Mr. and Mrs. Elison today, he  last saw Dr. Einar Gip, his cardiologist, it was 24 January 1999 1980 had felt miserable.  He describes that in spite of his history of hypertension, GERD, prostate cancer, hyperlipidemia, depression and osteoarthritis he was mainly suffering from fatigue, lack of energy and achiness and soreness.  Dr. Einar Gip had temporarily discontinued his statin medication and he felt a slight improvement. Dr. Darnell Level changed him therefore from Lipitor to Crestor. He later recalled that he had been asked to discontinue aspirin about 6-8 weeks earlier and that his arthralgias and myalgias may be related to this as well as a cloudiness of the mind that he had described.  He restarted his baby aspirin 14 days ago and felt immediately better ! He is now a different person.  His primary care physician, Dr. Concha Pyo, also ordered a rheumatological panel including Lyme disease, vitamin D vitamin D deficiency, anemia, eosinophilia  and thyroid disease which all returnedNegative.  Dr. Einar Gip ordered MRI and it was normal, ONO was borderline, an EEG by Dr Krista Blue was normal. A carotid doppler was negative -  Mild stenosis  less 50%, echocardiogram-by Dr. Einar Gip was performed on 01 January 2018 with a calculated EF of 56%, normal global wall motion, very mild possible diastolic  dysfunction, left ventricular cavity was normal in size, valves were normal, no evidence of pulmonary hypertension.  Stress test by treadmill on 28 December 2017 for fatigue hypertension the patient achieved 7.34 and 80s reached a heart rate of 154 bpm which is 105% of 72 maximum age-predicted heart rate stress test was terminated due to fatigue not due to cardiac dysfunction.    Chief complaint according to patient : " I think I am now back to normal."   Sleep habits are as follows:  After lunch nap - daily 1 hours . Dinnertime around 7 PM and sometimes walks after dinner, then watching TV. He may doze off during that time in his recliner . He goes to bed between 10 and 11 PM, and the bedroom is  cool, quiet and dark. Their cat is walking in and out of the bedroom.  He sleeps on his sides, 2 pillows, and bed is raised to 5-10 degrees, by 2 inches. He snores barely- and his wife has not noted apneas.  No nocturia ( status post radical prostatectomy). He  rises at 6-7 AM, spontaneously.  He feels best after his morning coffee, refreshed.    Sleep medical history and family sleep history:  Half brother by his mother with sleep apnea, using CPAP.   Social history: married, non smoker, non vapor- caffeine use - 2 cups in AM , tea in Pm 2-3 per week. ETOH, moderate 3 drink/ week    Review of Systems: Out of  a complete 14 system review, the patient complains of only the following symptoms, and all other reviewed systems are negative.   overcame a " near pneumonia" in May - June 2019 - felt terrible afterwards.  Epworth score 8/ 24  , Fatigue severity score 23/ 63  , depression score 2/ 15    Social History   Socioeconomic History  . Marital status: Married    Spouse name: Not on file  . Number of children: Not on file  . Years of education: Not on file  . Highest education level: Not on file  Occupational History  . Not on file  Social Needs  . Financial resource strain: Not on file  . Food  insecurity:    Worry: Not on file    Inability: Not on file  . Transportation needs:    Medical: Not on file    Non-medical: Not on file  Tobacco Use  . Smoking status: Never Smoker  . Smokeless tobacco: Never Used  Substance and Sexual Activity  . Alcohol use: Yes    Alcohol/week: 7.0 standard drinks    Types: 7 Shots of liquor per week    Comment: 1 drink daily  . Drug use: No  . Sexual activity: Not on file    Comment: retired, married. 1 son  Lifestyle  . Physical activity:    Days per week: Not on file    Minutes per session: Not on file  . Stress: Not on file  Relationships  . Social connections:    Talks on phone: Not on file    Gets together: Not on file    Attends religious service: Not on file    Active member of club or organization: Not on file    Attends meetings of clubs or organizations: Not on file    Relationship status: Not on file  . Intimate partner violence:    Fear of current or ex partner: Not on file    Emotionally abused: Not on file    Physically abused: Not on file    Forced sexual activity: Not on file  Other Topics Concern  . Not on file  Social History Narrative  . Not on file    No family history on file.  Past Medical History:  Diagnosis Date  . ED (erectile dysfunction) of organic origin   . GERD (gastroesophageal reflux disease)   . Hiatal hernia   . History of chronic bronchitis   . History of gastritis    2003  . History of Helicobacter pylori infection    2003  . History of kidney stones   . History of melanoma excision    2015--  right ear  . History of prostate cancer urologist-  dr [name]   2007--  pT2a Nx Mx,  Gleason 3+3, S/P  PROSTATECTOMY  . History of squamous cell carcinoma excision    2011  left knee  . Hyperlipidemia   . Hypertension   . Melanoma ([place])    left 4th toe  . Prostate cancer ([place])   . Skin cancer   . Wears glasses     Past Surgical History:  Procedure Laterality Date  . AMPUTATION TOE  Left 09/07/2015   Procedure: PARTIAL AMPUTATION TOE 4TH LEFT;  Surgeon: [name], MD;  Location: [facility];  Service: Podiatry;  Laterality: Left;  . APPENDECTOMY  1970's  . CYSTO/  CLOT EVACUATION POST PROSTATECTOMY  11-19-2005  . CYSTO/  REMOVAL URETHRAL STONE AND URETHRAL  STAPLES  06-02-2010  . CYSTO/  URETEROSCOPIC STONE EXTRACTION  X2  1990's  . MELANOMA EXCISION  2015   right ear  . ROBOT ASSISTED LAPAROSCOPIC RADICAL PROSTATECTOMY  11-09-2005  . TONSILLECTOMY  child    Current Outpatient Medications  Medication Sig Dispense Refill  . ascorbic acid (VITAMIN C) 1000 MG tablet Take 1,000 mg by mouth daily.    Marland Kitchen aspirin EC 81 MG tablet Take 81 mg by mouth daily.    . Calcium Carbonate Antacid 600 MG chewable tablet Chew 600 mg by mouth.    . Cholecalciferol (VITAMIN D) 2000 units CAPS Take 2,000 Units by mouth daily.     . hydrochlorothiazide (HYDRODIURIL) 25 MG tablet Take 25 mg by mouth every morning.    Marland Kitchen ibuprofen (ADVIL,MOTRIN) 200 MG tablet Take 200 mg by mouth every 6 (six) hours as needed.    Marland Kitchen losartan (COZAAR) 100 MG tablet Take 100 mg by mouth every morning.    . Multiple Vitamins-Minerals (CENTRUM SILVER PO) Take by mouth daily.    . Probiotic Product (PROBIOTIC DAILY PO) Take by mouth.    . rosuvastatin (CRESTOR) 5 MG tablet Take 1 tablet by mouth daily.    . verapamil (VERELAN PM) 240 MG 24 hr capsule Take 240 mg by mouth at bedtime.     No current facility-administered medications for this visit.     Allergies as of 03/27/2018  . (No Known Allergies)    Vitals: BP (!) 129/91   Pulse 91   Ht 5\' 10"  (1.778 m)   Wt 185 lb (83.9 kg)   BMI 26.54 kg/m  Last Weight:  Wt Readings from Last 1 Encounters:  03/27/18 185 lb (83.9 kg)   ZOX:WRUE mass index is 26.54 kg/m.     Last Height:   Ht Readings from Last 1 Encounters:  03/27/18 5\' 10"  (1.778 m)   MOCA:No flowsheet data found. MMSE: MMSE - Mini Mental State Exam 02/13/2018  Orientation to  time 5  Orientation to Place 5  Registration 3  Attention/ Calculation 3  Recall 3  Language- name 2 objects 2  Language- repeat 1  Language- follow 3 step command 3  Language- read & follow direction 1  Write a sentence 1  Copy design 1  Total score 28       Attention span & concentration ability appears normal.  Speech is fluent,  without dysarthria, dysphonia or aphasia.  Mood and affect are appropriate.  Cranial nerves: Pupils are equal and briskly reactive to light. Funduscopic exam without evidence of pallor or edema. Extraocular movements  in vertical and horizontal planes intact and without nystagmus. Visual fields by finger perimetry are intact. Hearing to finger rub intact.  Facial sensation intact to fine touch. Facial motor strength is symmetric and tongue and uvula move midline. Shoulder shrug was symmetrical.   Motor exam:   Normal tone, muscle bulk and symmetric strength in all extremities. Good bilateral grip- I noted tremor here too.  Sensory:  Fine touch, pinprick and vibration were normal-  Proprioception tested in the upper extremities was normal. Coordination: Rapid alternating movements in the fingers/hands was normal. Finger-to-nose maneuver on the right with  evidence of mild dysmetria and  Tremor.  Gait and station: Patient walks without assistive device . Strength within normal limits.  Stance is stable and normal.  Toe -stand and gait were tested and normal .Tandem gait is unfragmented. Turns with 2  Steps !. Romberg testing is  negative. Deep tendon reflexes:  in the  upper and lower extremities are symmetric and intact. Babinski maneuver response is downgoing.  Assessment:  After physical and neurologic examination, review of laboratory studies,  Personal review of imaging studies, reports of other /same  Imaging studies, results of polysomnography and / or neurophysiology testing and pre-existing records as far as provided in visit., my assessment is   1)   I have no idea what happened to Mr. Pollard and if the discontinuation of ASA was the cause of his sickness, however he has regained strength, battled fatigue and is not longer unsteady, and he has preserved reflexes (not GBS ).   2) after restarting ASA he feels overall well now. His statin has changed, His ONO was not abnormal-  We look at some motion artefact . NO hypoxemia - he has had negative rheumatological , inflammatory markers and cardiology work up.  3) He has no primary sleep concern-  I will offer a HST to screen for apnea, but I don't have a high index of suspicion.   The patient was advised of the nature of the diagnosed disorder , the treatment options and the  risks for general health and wellness arising from not treating the condition.   I spent more than 60 minutes of face to face time with the patient.  Greater than 50% of time was spent in counseling and coordination of care. We have discussed the diagnosis and differential and I answered the patient's questions.    Plan:  Treatment plan and additional workup :  HST to rule out apnea, hypoxemia.   Melatonin prn at night 5 mg or less.    Larey Seat, MD 03/26/8100, 7:51 PM  Certified in Neurology by ABPN Certified in [place] by [facility] [address] [place], [place] [address]  Opp per Dr Einar Gip- 04-01-2018  Adrian Prows, MD  [place], Asencion Partridge, MD        Nocturnal oximetry 01/11/2018: 32 minutes less than or equal to 88% SPO2. 65.9 minutes less than or equal to 89% SPO2. 246 desaturation events occurred. Delta SPO2 14.6 minutes. Awake Spo2 94%. 19.3% of the time patient was bradycardic with average pulse of 63 bpm. Lowest bradycardic heart rate was 53 bpm. Patient qualifies for nocturnal oxygen per Medicare guidelines per group 1.   I amm sure I set him for O2 supplimentation, but do not know if he is using but glad he is feeling better

## 2018-04-01 DIAGNOSIS — I10 Essential (primary) hypertension: Secondary | ICD-10-CM | POA: Diagnosis not present

## 2018-04-01 DIAGNOSIS — Z23 Encounter for immunization: Secondary | ICD-10-CM | POA: Diagnosis not present

## 2018-04-10 ENCOUNTER — Ambulatory Visit (INDEPENDENT_AMBULATORY_CARE_PROVIDER_SITE_OTHER): Payer: Medicare HMO | Admitting: Neurology

## 2018-04-10 DIAGNOSIS — G4733 Obstructive sleep apnea (adult) (pediatric): Secondary | ICD-10-CM

## 2018-04-10 DIAGNOSIS — G9331 Postviral fatigue syndrome: Secondary | ICD-10-CM

## 2018-04-10 DIAGNOSIS — R0683 Snoring: Secondary | ICD-10-CM

## 2018-04-10 DIAGNOSIS — G933 Postviral fatigue syndrome: Secondary | ICD-10-CM

## 2018-04-10 DIAGNOSIS — G4719 Other hypersomnia: Secondary | ICD-10-CM

## 2018-04-17 ENCOUNTER — Encounter: Payer: Self-pay | Admitting: Neurology

## 2018-04-17 DIAGNOSIS — G4733 Obstructive sleep apnea (adult) (pediatric): Secondary | ICD-10-CM | POA: Insufficient documentation

## 2018-04-17 NOTE — Procedures (Signed)
NAME:  Jared Lichtenwalner. Roberts                                                                DOB: [birth year] MEDICAL RECORD No: [id number]                                               DOS:  04/12/2018 REFERRING PHYSICIAN: Dr. Einar Gip, MD STUDY PERFORMED: Home Sleep Study on apnea link HISTORY:  Jared Roberts is a 72 y.o. Caucasian male patient that only recently has seen Dr. Krista Blue at [facility] but is seen on 03-27-2018 in a sleep related referral from Dr. Einar Gip, MD. [name] describes that in spite of his history of hypertension, GERD, prostate cancer, hyperlipidemia, depression, and osteoarthritis he was mainly suffering from fatigue, lack of energy and a generalized achiness and soreness.  Dr. Einar Gip had temporarily discontinued his statin medication and he felt a slight improvement.  He later recalled that he had been asked to discontinue aspirin about 6-8 weeks earlier and that his arthralgia and myalgia may be related to this .He mentioned a cloudiness of the mind that he had described.  He restarted his baby aspirin 14 days ago and felt immediately better! He is now "a different person". His primary care physician, Dr. Shelia Media, also ordered a rheumatological panel including tests for Lyme disease, vitamin D vitamin D deficiency, anemia, eosinophilia and thyroid disease which all returned negative.  Dr. Einar Gip ordered an ONO - result was borderline, an EEG by Dr. Krista Blue was normal. BMI: 26.5. Epworth Sleepiness Score: 8/ 24 points; FSS at 23/63 points.   STUDY RESULTS:  Total Recording Time: 9 hours 2 minutes, valid test time 8h 50 min. Total Apnea/Hypopnea Index (AHI): 11.1 /h; RDI: 12.3 /h Average Oxygen Saturation:  93 %: Lowest Oxygen Saturation: 86 %  Total Time in Oxygen Saturation below 89 %: 3.0 minutes  Average Heart Rate: 67 bpm (between 56 and 109 bpm) IMPRESSION: Mild Obstructive Sleep Apnea. Sinus tachycardia. No clinically significant hypoxemia.  RECOMMENDATION: This mild degree of OSA can be treated by  CPAP, by a dental device and could further improve with moderate exercise.   I certify that I have reviewed the raw data recording prior to the issuance of this report in accordance with the standards of the American Academy of Sleep Medicine (AASM). Larey Seat, M.D.   04-17-2018    Medical Director of [facility] at [facility], accredited by the AASM. Diplomat of the ABPN and ABSM.

## 2018-04-30 DIAGNOSIS — I10 Essential (primary) hypertension: Secondary | ICD-10-CM | POA: Diagnosis not present

## 2018-05-03 ENCOUNTER — Encounter: Payer: Self-pay | Admitting: Vascular Surgery

## 2018-05-03 ENCOUNTER — Ambulatory Visit: Payer: Medicare HMO | Admitting: Vascular Surgery

## 2018-05-03 ENCOUNTER — Encounter

## 2018-05-03 VITALS — BP 139/96 | HR 81 | Temp 97.0°F | Resp 18 | Ht 70.0 in | Wt 184.5 lb

## 2018-05-03 DIAGNOSIS — I6523 Occlusion and stenosis of bilateral carotid arteries: Secondary | ICD-10-CM

## 2018-05-03 NOTE — Progress Notes (Signed)
Patient ID: Jared Roberts, male   DOB: July 15, 1946, 72 y.o.   MRN: [id number]  Reason for Consult: Carotid (BP high but just changed medication with a delay in starting. Started back on new medication today. )   Referred by Deland Pretty, MD  Subjective:     HPI:  Jared Roberts is a 72 y.o. male sent for evaluation bilateral carotid stenosis.  He was having some dizziness and generally on well feelings and underwent carotid duplex which demonstrated stenosis bilaterally.  He had stopped taking aspirin but is now taking it again.  He has no history of stroke TIA or amaurosis.  He has noticed history personal or family of aneurysmal disease but there is some family history of coronary artery disease.  He does not have any personal history of coronary artery disease.  He is without tissue loss or ulcerations bilateral lower extremities.  Currently today is feeling quite well does not have any complaints after having switched his angiotensin receptor blocker.  He is a lifelong non-smoker  Past Medical History:  Diagnosis Date  . ED (erectile dysfunction) of organic origin   . GERD (gastroesophageal reflux disease)   . Hiatal hernia   . History of chronic bronchitis   . History of gastritis    2003  . History of Helicobacter pylori infection    2003  . History of kidney stones   . History of melanoma excision    2015--  right ear  . History of prostate cancer urologist-  dr [name]   2007--  pT2a Nx Mx,  Gleason 3+3, S/P  PROSTATECTOMY  . History of squamous cell carcinoma excision    2011  left knee  . Hyperlipidemia   . Hypertension   . Melanoma ([place])    left 4th toe  . Prostate cancer ([place])   . Skin cancer   . Wears glasses    History reviewed. No pertinent family history. Past Surgical History:  Procedure Laterality Date  . AMPUTATION TOE Left 09/07/2015   Procedure: PARTIAL AMPUTATION TOE 4TH LEFT;  Surgeon: [name], MD;  Location: [facility];  Service:  Podiatry;  Laterality: Left;  . APPENDECTOMY  1970's  . CYSTO/  CLOT EVACUATION POST PROSTATECTOMY  11-19-2005  . CYSTO/  REMOVAL URETHRAL STONE AND URETHRAL STAPLES  06-02-2010  . CYSTO/  URETEROSCOPIC STONE EXTRACTION  X2  1990's  . MELANOMA EXCISION  2015   right ear  . ROBOT ASSISTED LAPAROSCOPIC RADICAL PROSTATECTOMY  11-09-2005  . TONSILLECTOMY  child    Short Social History:  Social History   Tobacco Use  . Smoking status: Never Smoker  . Smokeless tobacco: Never Used  Substance Use Topics  . Alcohol use: Yes    Alcohol/week: 7.0 standard drinks    Types: 7 Shots of liquor per week    Comment: 1 drink daily    No Known Allergies  Current Outpatient Medications  Medication Sig Dispense Refill  . ascorbic acid (VITAMIN C) 1000 MG tablet Take 1,000 mg by mouth daily.    Marland Kitchen aspirin EC 81 MG tablet Take 81 mg by mouth daily.    . Calcium Carbonate Antacid 600 MG chewable tablet Chew 600 mg by mouth.    . Cholecalciferol (VITAMIN D) 2000 units CAPS Take 2,000 Units by mouth daily.     . hydrochlorothiazide (HYDRODIURIL) 25 MG tablet Take 25 mg by mouth every morning.    Marland Kitchen ibuprofen (ADVIL,MOTRIN) 200 MG tablet Take 200 mg by  mouth every 6 (six) hours as needed.    . Multiple Vitamins-Minerals (CENTRUM SILVER PO) Take by mouth daily.    Marland Kitchen olmesartan (BENICAR) 40 MG tablet     . Probiotic Product (PROBIOTIC DAILY PO) Take by mouth.    . rosuvastatin (CRESTOR) 5 MG tablet Take 1 tablet by mouth daily.    . verapamil (VERELAN PM) 240 MG 24 hr capsule Take 240 mg by mouth at bedtime.    Marland Kitchen losartan (COZAAR) 100 MG tablet Take 100 mg by mouth every morning.     No current facility-administered medications for this visit.     Review of Systems  Constitutional:  Constitutional negative. HENT: HENT negative.  Eyes: Eyes negative.  Respiratory: Positive for shortness of breath.  Cardiovascular: Cardiovascular negative.  GI: Gastrointestinal negative.  Musculoskeletal:  Musculoskeletal negative.  Neurological: Positive for dizziness.  Hematologic: Hematologic/lymphatic negative.        Objective:  Objective   Vitals:   05/03/18 0949 05/03/18 0952  BP: (!) 139/98 (!) 139/96  Pulse: 81   Resp: 18   Temp: (!) 97 F (36.1 C)   TempSrc: Oral   SpO2: 98%   Weight: 184 lb 8 oz (83.7 kg)   Height: 5\' 10"  (1.778 m)    Body mass index is 26.47 kg/m.  Physical Exam  Constitutional: He is oriented to person, place, and time. He appears well-developed.  HENT:  Head: Normocephalic.  Eyes: Pupils are equal, round, and reactive to light.  Neck: Normal range of motion. Neck supple.  Cardiovascular: Normal rate.  Pulses:      Radial pulses are 2+ on the right side, and 2+ on the left side.       Femoral pulses are 2+ on the right side, and 2+ on the left side.      Popliteal pulses are 2+ on the right side, and 2+ on the left side.       Dorsalis pedis pulses are 2+ on the right side, and 2+ on the left side.  No carotid bruits  Pulmonary/Chest: Effort normal and breath sounds normal.  Abdominal: Soft.  Musculoskeletal: Normal range of motion.  Neurological: He is oriented to person, place, and time.  Skin: Skin is warm and dry. Capillary refill takes less than 2 seconds.  Psychiatric: He has a normal mood and affect. His behavior is normal. Judgment and thought content normal.    Data: I reviewed his carotid duplexes bilaterally which are less than 50% stenosis with peak systolic velocity 73 on the right and on the left 77 cm/s     Assessment/Plan:     72 year old male with bilateral carotid artery stenosis which is asymptomatic.  He will continue aspirin.  He will follow-up in 1 year with repeat carotid duplex.  We discussed signs and symptoms of stroke when she demonstrates good understanding.     Waynetta Sandy MD [facility]

## 2018-05-21 ENCOUNTER — Ambulatory Visit: Payer: Medicare HMO | Admitting: Neurology

## 2018-05-21 ENCOUNTER — Encounter: Payer: Self-pay | Admitting: Neurology

## 2018-05-21 VITALS — BP 125/96 | HR 89 | Ht 70.0 in | Wt 187.8 lb

## 2018-05-21 DIAGNOSIS — R410 Disorientation, unspecified: Secondary | ICD-10-CM | POA: Diagnosis not present

## 2018-05-21 NOTE — Progress Notes (Signed)
PATIENT: Jared Roberts DOB: Dec 11, 1945  Chief Complaint  Patient presents with  . Confusion/Forgetfullness    His symptoms have continued with no worsening or improvement.  He has underwent normal brain MRI and normal EEG.    . Difficulty with Sleep/Fatigue    He completed an in-home sleep test showing mild OSA.  He was offered to try CPAP or dental device.  He declined both therapies.     HISTORICAL  Jared Roberts is a 72 year old male, accompanied by his wife Jared Roberts, seen in request by his primary care physician Dr. Shelia Media, [facility], for evaluation of memory loss, initial evaluation was on February 13, 2018.  I have reviewed and summarized the referring note, he had a past medical history of hypertension, lipidemia, melanoma of the ear, hyperplastic colon polyps, cervical degenerative disc disease  In April 2019, he suffered upper respiratory infection, concurrent with his upper respiratory symptoms, he also noticed mild dizziness, confusion, when he moved quickly, or staring at a moving visual object, he felt dizziness, difficulty focusing, need time to refocus, at the same time, he felt mild confusion, need to read few times to understand, sometimes tends to misinterpreted the message.  He had an argument with his wife about the article that they have read, the topic is about hand dryer, in his mind, he was thinking about handhold hairdryer,  Overall his symptoms has improved over the past 3 months, there are fluctuations, some days are better than the other, in his worst days, he does not have energy to do anything, just want to sit at his recliner, backing off his vehicle from driveway, by looking at the mirrors, turning his head few times quickly, he felt dizzy, "too much to handle".  He denies depression, no lateralized motor or sensory deficit, mild pressure sensation behind his eyes, and sinus,  We personally reviewed MRI of the brain in June 2019 generalized age-appropriate  atrophy, Ultrasound of carotid arteries pending  He was seen by cardiologist Dr. Einar Gip, cardiac work-up showed no significant abnormality.  He has been a lifelong light sleeper, easily to be awakening, disturbed during his sleep, but he has no snoring, no significant daytime sleepiness, he was referred to sleep study.  Laboratory evaluations normal CBC hemoglobin of 16.9, creatinine of 1.1, LDL of 72, B12,   UPDATE May 21 2018: He has generalized and vague complaints of fatigue, foggy sensation overall has some improvement,  EEG was normal on February 19 2018.  US carotid showed mild irregularity, no significant stenosis.  He was recently changed from Losartan to Olmesartan, he feel better,   He had in-home sleep study, patient stated that he did not sleep much during the study,  REVIEW OF SYSTEMS: Full 14 system review of systems performed and notable only for memory loss, dizziness, tremor, decreased concentration, joint pain, back pain aching muscles neck stiffness, frequent awakening, ringing in ears  ALLERGIES: No Known Allergies  HOME MEDICATIONS: Current Outpatient Medications  Medication Sig Dispense Refill  . ascorbic acid (VITAMIN C) 1000 MG tablet Take 1,000 mg by mouth daily.    Marland Kitchen aspirin EC 81 MG tablet Take 81 mg by mouth daily.    . Calcium Carbonate Antacid 600 MG chewable tablet Chew 600 mg by mouth.    . Cholecalciferol (VITAMIN D) 2000 units CAPS Take 2,000 Units by mouth daily.     . hydrochlorothiazide (HYDRODIURIL) 25 MG tablet Take 25 mg by mouth every morning.    Marland Kitchen ibuprofen (ADVIL,MOTRIN)  200 MG tablet Take 200 mg by mouth every 6 (six) hours as needed.    . Multiple Vitamins-Minerals (CENTRUM SILVER PO) Take by mouth daily.    Marland Kitchen olmesartan (BENICAR) 40 MG tablet Take 40 mg by mouth daily.     . Probiotic Product (PROBIOTIC DAILY PO) Take by mouth.    . rosuvastatin (CRESTOR) 5 MG tablet Take 1 tablet by mouth daily.    . verapamil (VERELAN PM) 240 MG 24 hr  capsule Take 240 mg by mouth at bedtime.     No current facility-administered medications for this visit.     PAST MEDICAL HISTORY: Past Medical History:  Diagnosis Date  . ED (erectile dysfunction) of organic origin   . GERD (gastroesophageal reflux disease)   . Hiatal hernia   . History of chronic bronchitis   . History of gastritis    2003  . History of Helicobacter pylori infection    2003  . History of kidney stones   . History of melanoma excision    2015--  right ear  . History of prostate cancer urologist-  dr [name]   2007--  pT2a Nx Mx,  Gleason 3+3, S/P  PROSTATECTOMY  . History of squamous cell carcinoma excision    2011  left knee  . Hyperlipidemia   . Hypertension   . Melanoma ([place])    left 4th toe  . Prostate cancer ([place])   . Skin cancer   . Wears glasses     PAST SURGICAL HISTORY: Past Surgical History:  Procedure Laterality Date  . AMPUTATION TOE Left 09/07/2015   Procedure: PARTIAL AMPUTATION TOE 4TH LEFT;  Surgeon: [name], MD;  Location: [facility];  Service: Podiatry;  Laterality: Left;  . APPENDECTOMY  1970's  . CYSTO/  CLOT EVACUATION POST PROSTATECTOMY  11-19-2005  . CYSTO/  REMOVAL URETHRAL STONE AND URETHRAL STAPLES  06-02-2010  . CYSTO/  URETEROSCOPIC STONE EXTRACTION  X2  1990's  . MELANOMA EXCISION  2015   right ear  . ROBOT ASSISTED LAPAROSCOPIC RADICAL PROSTATECTOMY  11-09-2005  . TONSILLECTOMY  child    FAMILY HISTORY: History reviewed. No pertinent family history.  SOCIAL HISTORY: Social History   Socioeconomic History  . Marital status: Married    Spouse name: Not on file  . Number of children: Not on file  . Years of education: Not on file  . Highest education level: Not on file  Occupational History  . Not on file  Social Needs  . Financial resource strain: Not on file  . Food insecurity:    Worry: Not on file    Inability: Not on file  . Transportation needs:    Medical: Not on file     Non-medical: Not on file  Tobacco Use  . Smoking status: Never Smoker  . Smokeless tobacco: Never Used  Substance and Sexual Activity  . Alcohol use: Yes    Alcohol/week: 7.0 standard drinks    Types: 7 Shots of liquor per week    Comment: 1 drink daily  . Drug use: No  . Sexual activity: Not on file    Comment: retired, married. 1 son  Lifestyle  . Physical activity:    Days per week: Not on file    Minutes per session: Not on file  . Stress: Not on file  Relationships  . Social connections:    Talks on phone: Not on file    Gets together: Not on file    Attends religious  service: Not on file    Active member of club or organization: Not on file    Attends meetings of clubs or organizations: Not on file    Relationship status: Not on file  . Intimate partner violence:    Fear of current or ex partner: Not on file    Emotionally abused: Not on file    Physically abused: Not on file    Forced sexual activity: Not on file  Other Topics Concern  . Not on file  Social History Narrative  . Not on file     PHYSICAL EXAM   Vitals:   05/21/18 1120  BP: (!) 125/96  Pulse: 89  Weight: 187 lb 12 oz (85.2 kg)  Height: 5\' 10"  (1.778 m)    Not recorded      Body mass index is 26.94 kg/m.  PHYSICAL EXAMNIATION:  Gen: NAD, conversant, well nourised, obese, well groomed                     Cardiovascular: Regular rate rhythm, no peripheral edema, warm, nontender. Eyes: Conjunctivae clear without exudates or hemorrhage Neck: Supple, no carotid bruits. Pulmonary: Clear to auscultation bilaterally   NEUROLOGICAL EXAM: MMSE - Mini Mental State Exam 02/13/2018  Orientation to time 5  Orientation to Place 5  Registration 3  Attention/ Calculation 3  Recall 3  Language- name 2 objects 2  Language- repeat 1  Language- follow 3 step command 3  Language- read & follow direction 1  Write a sentence 1  Copy design 1  Total score 28  Animal naming 18   CRANIAL  NERVES: CN II: Visual fields are full to confrontation. Fundoscopic exam is normal with sharp discs and no vascular changes. Pupils are round equal and briskly reactive to light. CN III, IV, VI: extraocular movement are normal. No ptosis. CN V: Facial sensation is intact to pinprick in all 3 divisions bilaterally. Corneal responses are intact.  CN VII: Face is symmetric with normal eye closure and smile. CN VIII: Hearing is normal to rubbing fingers CN IX, X: Palate elevates symmetrically. Phonation is normal. CN XI: Head turning and shoulder shrug are intact CN XII: Tongue is midline with normal movements and no atrophy.  MOTOR: There is no pronator drift of out-stretched arms. Muscle bulk and tone are normal. Muscle strength is normal.  REFLEXES: Reflexes are 2+ and symmetric at the biceps, triceps, knees, and ankles. Plantar responses are flexor.  SENSORY: Intact to light touch, pinprick, positional sensation and vibratory sensation are intact in fingers and toes.  COORDINATION: Rapid alternating movements and fine finger movements are intact. There is no dysmetria on finger-to-nose and heel-knee-shin.    GAIT/STANCE: Posture is normal. Gait is steady with normal steps, base, arm swing, and turning. Heel and toe walking are normal. Tandem gait is normal.  Romberg is absent.   DIAGNOSTIC DATA (LABS, IMAGING, TESTING) - I reviewed patient records, labs, notes, testing and imaging myself where available.   ASSESSMENT AND PLAN  Jared Roberts is a 72 y.o. male   Intermittent confusion, overall improved,  Normal MRI of brain  Differentiation diagnosis: Deconditioning, medication side effect, mood disorder  Laboratory evaluations showed no treatable etiology  EEG was normal  Continue to observe his symptoms,   Jared Roberts, M.D. Ph.D.  [facility] [address], [place], [place] [address] Ph: [phone] Fax: [phone]  CC: Deland Pretty,  MD

## 2018-05-23 DIAGNOSIS — K5730 Diverticulosis of large intestine without perforation or abscess without bleeding: Secondary | ICD-10-CM | POA: Diagnosis not present

## 2018-05-23 DIAGNOSIS — K648 Other hemorrhoids: Secondary | ICD-10-CM | POA: Diagnosis not present

## 2018-05-23 DIAGNOSIS — D125 Benign neoplasm of sigmoid colon: Secondary | ICD-10-CM | POA: Diagnosis not present

## 2018-05-23 DIAGNOSIS — D123 Benign neoplasm of transverse colon: Secondary | ICD-10-CM | POA: Diagnosis not present

## 2018-05-23 DIAGNOSIS — Z86010 Personal history of colonic polyps: Secondary | ICD-10-CM | POA: Diagnosis not present

## 2018-05-28 DIAGNOSIS — D125 Benign neoplasm of sigmoid colon: Secondary | ICD-10-CM | POA: Diagnosis not present

## 2018-05-28 DIAGNOSIS — D123 Benign neoplasm of transverse colon: Secondary | ICD-10-CM | POA: Diagnosis not present

## 2018-08-14 DIAGNOSIS — R69 Illness, unspecified: Secondary | ICD-10-CM | POA: Diagnosis not present

## 2018-08-26 DIAGNOSIS — E7800 Pure hypercholesterolemia, unspecified: Secondary | ICD-10-CM | POA: Diagnosis not present

## 2018-08-27 DIAGNOSIS — R69 Illness, unspecified: Secondary | ICD-10-CM | POA: Diagnosis not present

## 2018-09-18 DIAGNOSIS — L905 Scar conditions and fibrosis of skin: Secondary | ICD-10-CM | POA: Diagnosis not present

## 2018-09-18 DIAGNOSIS — Z23 Encounter for immunization: Secondary | ICD-10-CM | POA: Diagnosis not present

## 2018-09-18 DIAGNOSIS — Z85828 Personal history of other malignant neoplasm of skin: Secondary | ICD-10-CM | POA: Diagnosis not present

## 2018-09-18 DIAGNOSIS — D2261 Melanocytic nevi of right upper limb, including shoulder: Secondary | ICD-10-CM | POA: Diagnosis not present

## 2018-09-18 DIAGNOSIS — L821 Other seborrheic keratosis: Secondary | ICD-10-CM | POA: Diagnosis not present

## 2018-09-18 DIAGNOSIS — L570 Actinic keratosis: Secondary | ICD-10-CM | POA: Diagnosis not present

## 2018-09-18 DIAGNOSIS — D485 Neoplasm of uncertain behavior of skin: Secondary | ICD-10-CM | POA: Diagnosis not present

## 2018-09-18 DIAGNOSIS — Z85820 Personal history of malignant melanoma of skin: Secondary | ICD-10-CM | POA: Diagnosis not present

## 2018-09-18 DIAGNOSIS — L219 Seborrheic dermatitis, unspecified: Secondary | ICD-10-CM | POA: Diagnosis not present

## 2018-09-18 DIAGNOSIS — D2271 Melanocytic nevi of right lower limb, including hip: Secondary | ICD-10-CM | POA: Diagnosis not present

## 2018-09-20 DIAGNOSIS — R1031 Right lower quadrant pain: Secondary | ICD-10-CM | POA: Diagnosis not present

## 2018-09-20 DIAGNOSIS — N132 Hydronephrosis with renal and ureteral calculous obstruction: Secondary | ICD-10-CM | POA: Diagnosis not present

## 2018-09-20 DIAGNOSIS — N201 Calculus of ureter: Secondary | ICD-10-CM | POA: Diagnosis not present

## 2018-09-20 DIAGNOSIS — R1084 Generalized abdominal pain: Secondary | ICD-10-CM | POA: Diagnosis not present

## 2018-10-04 DIAGNOSIS — N201 Calculus of ureter: Secondary | ICD-10-CM | POA: Diagnosis not present

## 2019-01-27 ENCOUNTER — Encounter: Payer: Self-pay | Admitting: Cardiovascular Disease

## 2019-01-27 DIAGNOSIS — R9431 Abnormal electrocardiogram [ECG] [EKG]: Secondary | ICD-10-CM | POA: Diagnosis not present

## 2019-01-27 DIAGNOSIS — R079 Chest pain, unspecified: Secondary | ICD-10-CM | POA: Diagnosis not present

## 2019-01-27 DIAGNOSIS — R5383 Other fatigue: Secondary | ICD-10-CM | POA: Diagnosis not present

## 2019-01-27 DIAGNOSIS — R0602 Shortness of breath: Secondary | ICD-10-CM | POA: Diagnosis not present

## 2019-01-30 ENCOUNTER — Telehealth: Payer: Self-pay | Admitting: Cardiovascular Disease

## 2019-01-30 ENCOUNTER — Encounter: Payer: Self-pay | Admitting: Cardiovascular Disease

## 2019-01-30 NOTE — Telephone Encounter (Signed)
Talked to the pt and advised him to have his wife call the office back to have appropriate screening questions done on her, so that we can decipher if she can accommodate the pt to his appt with Dr Acie Fredrickson tomorrow.  Pt states she will be back before 5 pm, and will have her call the office and request to speak with a triage nurse, for pre-covid screening questions.

## 2019-01-30 NOTE — Telephone Encounter (Signed)
New message    Left message to confirm appt and answer COVID Questions

## 2019-01-30 NOTE — Telephone Encounter (Signed)

## 2019-01-30 NOTE — Telephone Encounter (Signed)
New Message    Pt is calling to confirm his appt  He says he has a hard time remembering things and he would like for his wife to assist him in coming to his appt tomorrow  Pts wife answered NO to all screening questions    Please call back

## 2019-01-30 NOTE — Telephone Encounter (Signed)
    COVID-19 Pre-Screening Questions:  . In the past 7 to 10 days have you had a cough,  shortness of breath, headache, congestion, fever (100 or greater) body aches, chills, sore throat, or sudden loss of taste or sense of smell?--NO . Have you been around anyone with known Covid 19.-NO . Have you been around anyone who is awaiting Covid 19 test results in the past 7 to 10 days?-NO- . Have you been around anyone who has been exposed to Covid 19, or has mentioned symptoms of Covid 19 within the past 7 to 10 days?-NO  [facility] TO HIS [place] [facility] [place].  PTS WIFE WILL COME WITH HIM FOR HE HAS MEMORY ISSUES AND SHE WILL BE ABLE TO TAKE NOTES FOR HIM.  WIFE ANSWERED NO TO ALL SCREENING QUESTIONS AS WELL AS THE PT DID. WIFE AND PT ARE AWARE THAT THEY MUST WEAR THEIR FACIAL MASK DURING THE ENTIRE DURATION OF HIS OV.  BOTH VERBALIZED UNDERSTANDING AND I WILL UPDATE THIS IN APPT NOTES.

## 2019-01-30 NOTE — Telephone Encounter (Signed)
[place]

## 2019-01-31 ENCOUNTER — Ambulatory Visit: Payer: Medicare HMO | Admitting: Cardiovascular Disease

## 2019-01-31 ENCOUNTER — Other Ambulatory Visit: Payer: Self-pay

## 2019-01-31 ENCOUNTER — Encounter: Payer: Self-pay | Admitting: Cardiovascular Disease

## 2019-01-31 VITALS — BP 136/90 | HR 88 | Ht 70.0 in | Wt 181.1 lb

## 2019-01-31 DIAGNOSIS — R0789 Other chest pain: Secondary | ICD-10-CM | POA: Diagnosis not present

## 2019-01-31 DIAGNOSIS — R079 Chest pain, unspecified: Secondary | ICD-10-CM | POA: Diagnosis not present

## 2019-01-31 DIAGNOSIS — I10 Essential (primary) hypertension: Secondary | ICD-10-CM | POA: Diagnosis not present

## 2019-01-31 DIAGNOSIS — I119 Hypertensive heart disease without heart failure: Secondary | ICD-10-CM

## 2019-01-31 MED ORDER — NITROGLYCERIN 0.4 MG SL SUBL: 0.4000 mg | SUBLINGUAL_TABLET | SUBLINGUAL | 6 refills | Status: DC | PRN

## 2019-01-31 MED ORDER — METOPROLOL TARTRATE 100 MG PO TABS: ORAL_TABLET | ORAL | 0 refills | Status: DC

## 2019-01-31 NOTE — H&P (View-Only) (Signed)
Cardiology Office Note:    Date:  01/31/2019   ID:  Jared Roberts, DOB Nov 09, 1945, MRN [id number]  PCP:  Deland Pretty, MD  Cardiologist:  [place]  Electrophysiologist:  None   Referring MD: Deland Pretty, MD   Chief Complaint  Patient presents with  . Hypertension  . Transient Ischemic Attack    January 31, 2019    Jared Roberts is a 73 y.o. male with a hx of  HTN, TIA and carotid stenosis .  Has been having some chest pain  A week ago, mowed the lawn,  Had CP and profound fatigue. Upper left chest ,  Left shoulder and arm,  Left jaw.   Comes and goes ,  Last for seconds - minutes ,   Resolves if he stops to rest.  Associated with dyspnea but also has dyspnea on its own.  Doesn't exercise,  Works in the yard . Can climb 2 flights of stairs - makes him breath hard.   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  Non smoker, Drinks 1 drink per night   Past Medical History:  Diagnosis Date  . Chest discomfort   . DDD (degenerative disc disease), cervical   . Depression   . Diarrhea   . Dizziness   . DOE (dyspnea on exertion)   . ED (erectile dysfunction) of organic origin   . Fatigue   . Generalized osteoarthritis   . GERD (gastroesophageal reflux disease)   . Hiatal hernia   . History of chronic bronchitis   . History of gastritis    2003  . History of Helicobacter pylori infection    2003  . History of kidney stones   . History of melanoma excision    2015--  right ear  . History of prostate cancer urologist-  dr [name]   2007--  pT2a Nx Mx,  Gleason 3+3, S/P  PROSTATECTOMY  . History of squamous cell carcinoma excision    2011  left knee  . Hx of headache   . Hx of hemorrhoids   . Hyperlipidemia   . Hyperplastic colon polyp 2009  . Hypertension   . Male hypogonadism   . Melanoma ([place])    left 4th toe  . Nephrolithiasis   . Palpitations   . Prostate cancer ([place])   . Rectal bleeding   . Shoulder pain, left   . Skin cancer   .  Tinnitus   . Wears glasses     Past Surgical History:  Procedure Laterality Date  . AMPUTATION TOE Left 09/07/2015   Procedure: PARTIAL AMPUTATION TOE 4TH LEFT;  Surgeon: [name], MD;  Location: [facility];  Service: Podiatry;  Laterality: Left;  . APPENDECTOMY  1970's  . CYSTO/  CLOT EVACUATION POST PROSTATECTOMY  11-19-2005  . CYSTO/  REMOVAL URETHRAL STONE AND URETHRAL STAPLES  06-02-2010  . CYSTO/  URETEROSCOPIC STONE EXTRACTION  X2  1990's  . KIDNEY STONE SURGERY    . MELANOMA EXCISION  2015   right ear  . ROBOT ASSISTED LAPAROSCOPIC RADICAL PROSTATECTOMY  11-09-2005  . SKIN CANCER EXCISION    . TONSILLECTOMY  child    Current Medications: Current Meds  Medication Sig  . ascorbic acid (VITAMIN C) 1000 MG tablet Take 1,000 mg by mouth daily.  Marland Kitchen aspirin EC 81 MG tablet Take 81 mg by mouth daily.  Marland Kitchen atorvastatin (LIPITOR) 10 MG tablet Take 1 tablet by mouth daily.  . calcium carbonate (TUMS -  DOSED IN MG ELEMENTAL CALCIUM) 500 MG chewable tablet Chew 1 tablet by mouth daily as needed for indigestion or heartburn.  . Cholecalciferol (VITAMIN D) 2000 units CAPS Take 2,000 Units by mouth daily.   . hydrochlorothiazide (HYDRODIURIL) 25 MG tablet Take 25 mg by mouth every morning.  Marland Kitchen ibuprofen (ADVIL,MOTRIN) 200 MG tablet Take 200 mg by mouth every 6 (six) hours as needed.  . Multiple Vitamins-Minerals (CENTRUM SILVER PO) Take by mouth daily.  Marland Kitchen olmesartan (BENICAR) 40 MG tablet Take 40 mg by mouth daily.   . Probiotic Product (PROBIOTIC DAILY PO) Take by mouth.  . verapamil (VERELAN PM) 240 MG 24 hr capsule Take 240 mg by mouth at bedtime.     Allergies:   Patient has no known allergies.   Social History   Socioeconomic History  . Marital status: Married    Spouse name: Not on file  . Number of children: Not on file  . Years of education: Not on file  . Highest education level: Not on file  Occupational History  . Not on file  Social Needs  . Financial  resource strain: Not on file  . Food insecurity    Worry: Not on file    Inability: Not on file  . Transportation needs    Medical: Not on file    Non-medical: Not on file  Tobacco Use  . Smoking status: Never Smoker  . Smokeless tobacco: Never Used  Substance and Sexual Activity  . Alcohol use: Yes    Alcohol/week: 7.0 standard drinks    Types: 7 Shots of liquor per week    Comment: 1 drink daily  . Drug use: No  . Sexual activity: Not on file    Comment: retired, married. 1 son  Lifestyle  . Physical activity    Days per week: Not on file    Minutes per session: Not on file  . Stress: Not on file  Relationships  . Social Herbalist on phone: Not on file    Gets together: Not on file    Attends religious service: Not on file    Active member of club or organization: Not on file    Attends meetings of clubs or organizations: Not on file    Relationship status: Not on file  Other Topics Concern  . Not on file  Social History Narrative  . Not on file     Family History: The patient's family history includes Arthritis in his mother; Congestive Heart Failure in his mother; Diabetes Mellitus II in his mother; Osteoarthritis in his mother; Other in his father.  ROS:   Please see the history of present illness.     All other systems reviewed and are negative.  EKGs/Labs/Other Studies Reviewed:    The following studies were reviewed today:   EKG:  January 27, 2019 at Dr. Denita Lung office : Normal sinus rhythm at 91 beats a minute.  No ST or T wave changes.  Recent Labs: 02/13/2018: TSH 3.520  Recent Lipid Panel No results found for: CHOL, TRIG, HDL, CHOLHDL, VLDL, LDLCALC, LDLDIRECT  Physical Exam:    VS:  BP 136/90   Pulse 88   Ht 5\' 10"  (1.778 m)   Wt 181 lb 1.9 oz (82.2 kg)   BMI 25.99 kg/m     Wt Readings from Last 3 Encounters:  01/31/19 181 lb 1.9 oz (82.2 kg)  05/21/18 187 lb 12 oz (85.2 kg)  05/03/18 184 lb 8 oz (83.7  kg)     GEN: elderly male,   NAD  HEENT: Normal NECK: No JVD; No carotid bruits LYMPHATICS: No lymphadenopathy CARDIAC: RR  RESPIRATORY:  Clear to auscultation without rales, wheezing or rhonchi  ABDOMEN: Soft, non-tender, non-distended MUSCULOSKELETAL:  No edema; No deformity  SKIN: Warm and dry NEUROLOGIC:  Alert and oriented x 3 PSYCHIATRIC:  Normal affect   ASSESSMENT:    1. Chest pain, unspecified type   2. Hypertensive heart disease without heart failure    PLAN:    In order of problems listed above:  1. Chest pain :  Had a stress test with Dr. Nadyne Coombes a year ago .  Just POET,  Not myoview apparently .    Will get a Coronary CT angiogram for further eval   2.  HTN:  BP looks good , continue meds.  Still eating salt .  Advised salt restrriction.    3.  Hyperlipidemia Labs from February, 2020.  Triglyceride level is 187.  HDL is 56.  LDL is 73.  Total cholesterol is 166.  Liver enzymes look good.  4.  Dyspnea:     Medication Adjustments/Labs and Tests Ordered: Current medicines are reviewed at length with the patient today.  Concerns regarding medicines are outlined above.  Orders Placed This Encounter  Procedures  . CT CORONARY MORPH W/CTA COR W/SCORE W/CA W/CM &/OR WO/CM  . CT CORONARY FRACTIONAL FLOW RESERVE DATA PREP  . CT CORONARY FRACTIONAL FLOW RESERVE FLUID ANALYSIS  . Basic Metabolic Panel (BMET)  . ECHOCARDIOGRAM COMPLETE   Meds ordered this encounter  Medications  . metoprolol tartrate (LOPRESSOR) 100 MG tablet    Sig: Take 1 pill 2 hours before your CT    Dispense:  1 tablet    Refill:  0  . nitroGLYCERIN (NITROSTAT) 0.4 MG SL tablet    Sig: Place 1 tablet (0.4 mg total) under the tongue every 5 (five) minutes as needed for chest pain.    Dispense:  25 tablet    Refill:  6     Patient Instructions  Medication Instructions:  Your physician has recommended you make the following change in your medication:  You have been prescribed Sublingual NTG (nitroglycerin) 0.4 mg - place  1 pill under your tongue for chest pain You may repeat this 5 minutes later if chest pain persists Call 911 if pain persists and you may take a 3rd tablet (do not exceed 3 tablets in one episode)  If you need a refill on your cardiac medications before your next appointment, please call your pharmacy.    Lab work: None Ordered   Testing/Procedures: Your physician has requested that you have an echocardiogram. Echocardiography is a painless test that uses sound waves to create images of your heart. It provides your doctor with information about the size and shape of your heart and how well your heart's chambers and valves are working. This procedure takes approximately one hour. There are no restrictions for this procedure.   Please arrive at the [facility] main entrance of [facility] at xx:xx AM (30-45 minutes prior to test start time)  [facility] [place], [place] [address] [phone]  Proceed to the [facility] Radiology Department (First Floor).  Please follow these instructions carefully (unless otherwise directed):  Hold all erectile dysfunction medications at least 48 hours prior to test.  On the Night Before the Test: . Be sure to Drink plenty of water. . Do not consume any  caffeinated/decaffeinated beverages or chocolate 12 hours prior to your test. . Do not take any antihistamines 12 hours prior to your test.   On the Day of the Test: . Drink plenty of water. Do not drink any water within one hour of the test. . Do not eat any food 4 hours prior to the test. . You may take your regular medications prior to the test.  . Take metoprolol (Lopressor) two hours prior to test. . HOLD Hydrochlorothiazide morning of the test.        After the Test: . Drink plenty of water. . After receiving IV contrast, you may experience a mild flushed feeling. This is normal. . On occasion, you may experience a mild rash up to 24 hours after the  test. This is not dangerous. If this occurs, you can take Benadryl 25 mg and increase your fluid intake. . If you experience trouble breathing, this can be serious. If it is severe call 911 IMMEDIATELY. If it is mild, please call our office. . If you take any of these medications: Glipizide/Metformin, Avandament, Glucavance, please do not take 48 hours after completing test.   Follow-Up: At [facility], you and your health needs are our priority.  As part of our continuing mission to provide you with exceptional heart care, we have created designated Provider Care Teams.  These Care Teams include your primary Cardiologist (physician) and Advanced Practice Providers (APPs -  Physician Assistants and Nurse Practitioners) who all work together to provide you with the care you need, when you need it. You will need a follow up appointment in:  3 months on Tuesday Oct. 12 at 8:00 am with Dr. Acie Fredrickson. In the future, you may see one of the  following Advanced Practice Providers on your designated Care Team: Richardson Dopp, PA-C [place], [place] . Daune Perch, NP        Signed, Mertie Moores, MD  01/31/2019 6:53 PM    [place]

## 2019-01-31 NOTE — Patient Instructions (Addendum)
Medication Instructions:  Your physician has recommended you make the following change in your medication:  You have been prescribed Sublingual NTG (nitroglycerin) 0.4 mg - place 1 pill under your tongue for chest pain You may repeat this 5 minutes later if chest pain persists Call 911 if pain persists and you may take a 3rd tablet (do not exceed 3 tablets in one episode)  If you need a refill on your cardiac medications before your next appointment, please call your pharmacy.    Lab work: None Ordered   Testing/Procedures: Your physician has requested that you have an echocardiogram. Echocardiography is a painless test that uses sound waves to create images of your heart. It provides your doctor with information about the size and shape of your heart and how well your heart's chambers and valves are working. This procedure takes approximately one hour. There are no restrictions for this procedure.   Please arrive at the [facility] main entrance of [facility] at xx:xx AM (30-45 minutes prior to test start time)  [facility] [place], [place] [address] [phone]  Proceed to the [facility] Radiology Department (First Floor).  Please follow these instructions carefully (unless otherwise directed):  Hold all erectile dysfunction medications at least 48 hours prior to test.  On the Night Before the Test: . Be sure to Drink plenty of water. . Do not consume any caffeinated/decaffeinated beverages or chocolate 12 hours prior to your test. . Do not take any antihistamines 12 hours prior to your test.   On the Day of the Test: . Drink plenty of water. Do not drink any water within one hour of the test. . Do not eat any food 4 hours prior to the test. . You may take your regular medications prior to the test.  . Take metoprolol (Lopressor) two hours prior to test. . HOLD Hydrochlorothiazide morning of the test.        After the  Test: . Drink plenty of water. . After receiving IV contrast, you may experience a mild flushed feeling. This is normal. . On occasion, you may experience a mild rash up to 24 hours after the test. This is not dangerous. If this occurs, you can take Benadryl 25 mg and increase your fluid intake. . If you experience trouble breathing, this can be serious. If it is severe call 911 IMMEDIATELY. If it is mild, please call our office. . If you take any of these medications: Glipizide/Metformin, Avandament, Glucavance, please do not take 48 hours after completing test.   Follow-Up: At [facility], you and your health needs are our priority.  As part of our continuing mission to provide you with exceptional heart care, we have created designated Provider Care Teams.  These Care Teams include your primary Cardiologist (physician) and Advanced Practice Providers (APPs -  Physician Assistants and Nurse Practitioners) who all work together to provide you with the care you need, when you need it. You will need a follow up appointment in:  3 months on Tuesday Oct. 12 at 8:00 am with Dr. Acie Fredrickson. In the future, you may see one of the  following Advanced Practice Providers on your designated Care Team: Richardson Dopp, PA-C [place], [place] . Daune Perch, NP

## 2019-01-31 NOTE — Progress Notes (Signed)
Cardiology Office Note:    Date:  01/31/2019   ID:  [name], DOB 1945-09-30, MRN [id number]  PCP:  Deland Pretty, MD  Cardiologist:    Electrophysiologist:  None   Referring MD: Deland Pretty, MD   Chief Complaint  Patient presents with  . Hypertension  . Transient Ischemic Attack    January 31, 2019    Jared Roberts is a 73 y.o. male with a hx of  HTN, TIA and carotid stenosis .  Has been having some chest pain  A week ago, mowed the lawn,  Had CP and profound fatigue. Upper left chest ,  Left shoulder and arm,  Left jaw.   Comes and goes ,  Last for seconds - minutes ,   Resolves if he stops to rest.  Associated with dyspnea but also has dyspnea on its own.  Doesn't exercise,  Works in the yard . Can climb 2 flights of stairs - makes him breath hard.   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  Non smoker, Drinks 1 drink per night   Past Medical History:  Diagnosis Date  . Chest discomfort   . DDD (degenerative disc disease), cervical   . Depression   . Diarrhea   . Dizziness   . DOE (dyspnea on exertion)   . ED (erectile dysfunction) of organic origin   . Fatigue   . Generalized osteoarthritis   . GERD (gastroesophageal reflux disease)   . Hiatal hernia   . History of chronic bronchitis   . History of gastritis    2003  . History of Helicobacter pylori infection    2003  . History of kidney stones   . History of melanoma excision    2015--  right ear  . History of prostate cancer urologist-  dr [name]   2007--  pT2a Nx Mx,  Gleason 3+3, S/P  PROSTATECTOMY  . History of squamous cell carcinoma excision    2011  left knee  . Hx of headache   . Hx of hemorrhoids   . Hyperlipidemia   . Hyperplastic colon polyp 2009  . Hypertension   . Male hypogonadism   . Melanoma ([place])    left 4th toe  . Nephrolithiasis   . Palpitations   . Prostate cancer ([place])   . Rectal bleeding   . Shoulder pain, left   . Skin cancer   .  Tinnitus   . Wears glasses     Past Surgical History:  Procedure Laterality Date  . AMPUTATION TOE Left 09/07/2015   Procedure: PARTIAL AMPUTATION TOE 4TH LEFT;  Surgeon: [name], MD;  Location: [facility];  Service: Podiatry;  Laterality: Left;  . APPENDECTOMY  1970's  . CYSTO/  CLOT EVACUATION POST PROSTATECTOMY  11-19-2005  . CYSTO/  REMOVAL URETHRAL STONE AND URETHRAL STAPLES  06-02-2010  . CYSTO/  URETEROSCOPIC STONE EXTRACTION  X2  1990's  . KIDNEY STONE SURGERY    . MELANOMA EXCISION  2015   right ear  . ROBOT ASSISTED LAPAROSCOPIC RADICAL PROSTATECTOMY  11-09-2005  . SKIN CANCER EXCISION    . TONSILLECTOMY  child    Current Medications: Current Meds  Medication Sig  . ascorbic acid (VITAMIN C) 1000 MG tablet Take 1,000 mg by mouth daily.  Marland Kitchen aspirin EC 81 MG tablet Take 81 mg by mouth daily.  Marland Kitchen atorvastatin (LIPITOR) 10 MG tablet Take 1 tablet by mouth daily.  . calcium carbonate (TUMS -  DOSED IN MG ELEMENTAL CALCIUM) 500 MG chewable tablet Chew 1 tablet by mouth daily as needed for indigestion or heartburn.  . Cholecalciferol (VITAMIN D) 2000 units CAPS Take 2,000 Units by mouth daily.   . hydrochlorothiazide (HYDRODIURIL) 25 MG tablet Take 25 mg by mouth every morning.  Marland Kitchen ibuprofen (ADVIL,MOTRIN) 200 MG tablet Take 200 mg by mouth every 6 (six) hours as needed.  . Multiple Vitamins-Minerals (CENTRUM SILVER PO) Take by mouth daily.  Marland Kitchen olmesartan (BENICAR) 40 MG tablet Take 40 mg by mouth daily.   . Probiotic Product (PROBIOTIC DAILY PO) Take by mouth.  . verapamil (VERELAN PM) 240 MG 24 hr capsule Take 240 mg by mouth at bedtime.     Allergies:   Patient has no known allergies.   Social History   Socioeconomic History  . Marital status: Married    Spouse name: Not on file  . Number of children: Not on file  . Years of education: Not on file  . Highest education level: Not on file  Occupational History  . Not on file  Social Needs  . Financial  resource strain: Not on file  . Food insecurity    Worry: Not on file    Inability: Not on file  . Transportation needs    Medical: Not on file    Non-medical: Not on file  Tobacco Use  . Smoking status: Never Smoker  . Smokeless tobacco: Never Used  Substance and Sexual Activity  . Alcohol use: Yes    Alcohol/week: 7.0 standard drinks    Types: 7 Shots of liquor per week    Comment: 1 drink daily  . Drug use: No  . Sexual activity: Not on file    Comment: retired, married. 1 son  Lifestyle  . Physical activity    Days per week: Not on file    Minutes per session: Not on file  . Stress: Not on file  Relationships  . Social Herbalist on phone: Not on file    Gets together: Not on file    Attends religious service: Not on file    Active member of club or organization: Not on file    Attends meetings of clubs or organizations: Not on file    Relationship status: Not on file  Other Topics Concern  . Not on file  Social History Narrative  . Not on file     Family History: The patient's family history includes Arthritis in his mother; Congestive Heart Failure in his mother; Diabetes Mellitus II in his mother; Osteoarthritis in his mother; Other in his father.  ROS:   Please see the history of present illness.     All other systems reviewed and are negative.  EKGs/Labs/Other Studies Reviewed:    The following studies were reviewed today:   EKG:  January 27, 2019 at Dr. Denita Lung office : Normal sinus rhythm at 91 beats a minute.  No ST or T wave changes.  Recent Labs: 02/13/2018: TSH 3.520  Recent Lipid Panel No results found for: CHOL, TRIG, HDL, CHOLHDL, VLDL, LDLCALC, LDLDIRECT  Physical Exam:    VS:  BP 136/90   Pulse 88   Ht 5\' 10"  (1.778 m)   Wt 181 lb 1.9 oz (82.2 kg)   BMI 25.99 kg/m     Wt Readings from Last 3 Encounters:  01/31/19 181 lb 1.9 oz (82.2 kg)  05/21/18 187 lb 12 oz (85.2 kg)  05/03/18 184 lb 8 oz (83.7  kg)     GEN: elderly male,   NAD  HEENT: Normal NECK: No JVD; No carotid bruits LYMPHATICS: No lymphadenopathy CARDIAC: RR  RESPIRATORY:  Clear to auscultation without rales, wheezing or rhonchi  ABDOMEN: Soft, non-tender, non-distended MUSCULOSKELETAL:  No edema; No deformity  SKIN: Warm and dry NEUROLOGIC:  Alert and oriented x 3 PSYCHIATRIC:  Normal affect   ASSESSMENT:    1. Chest pain, unspecified type   2. Hypertensive heart disease without heart failure    PLAN:    In order of problems listed above:  1. Chest pain :  Had a stress test with Dr. Nadyne Coombes a year ago .  Just POET,  Not myoview apparently .    Will get a Coronary CT angiogram for further eval   2.  HTN:  BP looks good , continue meds.  Still eating salt .  Advised salt restrriction.    3.  Hyperlipidemia Labs from February, 2020.  Triglyceride level is 187.  HDL is 56.  LDL is 73.  Total cholesterol is 166.  Liver enzymes look good.  4.  Dyspnea:     Medication Adjustments/Labs and Tests Ordered: Current medicines are reviewed at length with the patient today.  Concerns regarding medicines are outlined above.  Orders Placed This Encounter  Procedures  . CT CORONARY MORPH W/CTA COR W/SCORE W/CA W/CM &/OR WO/CM  . CT CORONARY FRACTIONAL FLOW RESERVE DATA PREP  . CT CORONARY FRACTIONAL FLOW RESERVE FLUID ANALYSIS  . Basic Metabolic Panel (BMET)  . ECHOCARDIOGRAM COMPLETE   Meds ordered this encounter  Medications  . metoprolol tartrate (LOPRESSOR) 100 MG tablet    Sig: Take 1 pill 2 hours before your CT    Dispense:  1 tablet    Refill:  0  . nitroGLYCERIN (NITROSTAT) 0.4 MG SL tablet    Sig: Place 1 tablet (0.4 mg total) under the tongue every 5 (five) minutes as needed for chest pain.    Dispense:  25 tablet    Refill:  6     Patient Instructions  Medication Instructions:  Your physician has recommended you make the following change in your medication:  You have been prescribed Sublingual NTG (nitroglycerin) 0.4 mg - place  1 pill under your tongue for chest pain You may repeat this 5 minutes later if chest pain persists Call 911 if pain persists and you may take a 3rd tablet (do not exceed 3 tablets in one episode)  If you need a refill on your cardiac medications before your next appointment, please call your pharmacy.    Lab work: None Ordered   Testing/Procedures: Your physician has requested that you have an echocardiogram. Echocardiography is a painless test that uses sound waves to create images of your heart. It provides your doctor with information about the size and shape of your heart and how well your heart's chambers and valves are working. This procedure takes approximately one hour. There are no restrictions for this procedure.   Please arrive at the [facility] main entrance of [facility] at xx:xx AM (30-45 minutes prior to test start time)  [facility] [place], [place] [address] [phone]  Proceed to the [facility] Radiology Department (First Floor).  Please follow these instructions carefully (unless otherwise directed):  Hold all erectile dysfunction medications at least 48 hours prior to test.  On the Night Before the Test: . Be sure to Drink plenty of water. . Do not consume any  caffeinated/decaffeinated beverages or chocolate 12 hours prior to your test. . Do not take any antihistamines 12 hours prior to your test.   On the Day of the Test: . Drink plenty of water. Do not drink any water within one hour of the test. . Do not eat any food 4 hours prior to the test. . You may take your regular medications prior to the test.  . Take metoprolol (Lopressor) two hours prior to test. . HOLD Hydrochlorothiazide morning of the test.        After the Test: . Drink plenty of water. . After receiving IV contrast, you may experience a mild flushed feeling. This is normal. . On occasion, you may experience a mild rash up to 24 hours after the  test. This is not dangerous. If this occurs, you can take Benadryl 25 mg and increase your fluid intake. . If you experience trouble breathing, this can be serious. If it is severe call 911 IMMEDIATELY. If it is mild, please call our office. . If you take any of these medications: Glipizide/Metformin, Avandament, Glucavance, please do not take 48 hours after completing test.   Follow-Up: At [facility], you and your health needs are our priority.  As part of our continuing mission to provide you with exceptional heart care, we have created designated Provider Care Teams.  These Care Teams include your primary Cardiologist (physician) and Advanced Practice Providers (APPs -  Physician Assistants and Nurse Practitioners) who all work together to provide you with the care you need, when you need it. You will need a follow up appointment in:  3 months on Tuesday Oct. 12 at 8:00 am with Dr. Acie Fredrickson. In the future, you may see one of the  following Advanced Practice Providers on your designated Care Team: Richardson Dopp, PA-C [place], [place] . Daune Perch, NP        Signed, Mertie Moores, MD  01/31/2019 6:53 PM    [place]

## 2019-02-06 ENCOUNTER — Other Ambulatory Visit: Payer: Medicare HMO | Admitting: *Deleted

## 2019-02-06 ENCOUNTER — Other Ambulatory Visit: Payer: Self-pay

## 2019-02-06 ENCOUNTER — Ambulatory Visit (HOSPITAL_COMMUNITY): Payer: Medicare HMO | Attending: Internal Medicine

## 2019-02-06 DIAGNOSIS — I119 Hypertensive heart disease without heart failure: Secondary | ICD-10-CM

## 2019-02-06 DIAGNOSIS — R079 Chest pain, unspecified: Secondary | ICD-10-CM | POA: Diagnosis not present

## 2019-02-06 LAB — BASIC METABOLIC PANEL
BUN/Creatinine Ratio: 16 (ref 10–24)
BUN: 21 mg/dL (ref 8–27)
CO2: 24 mmol/L (ref 20–29)
Calcium: 10.7 mg/dL — ABNORMAL HIGH (ref 8.6–10.2)
Chloride: 103 mmol/L (ref 96–106)
Creatinine, Ser: 1.28 mg/dL — ABNORMAL HIGH (ref 0.76–1.27)
GFR calc Af Amer: 63 mL/min/{1.73_m2} (ref >59)
GFR calc non Af Amer: 54 mL/min/{1.73_m2} — ABNORMAL LOW (ref >59)
Glucose: 105 mg/dL — ABNORMAL HIGH (ref 65–99)
Potassium: 4 mmol/L (ref 3.5–5.2)
Sodium: 141 mmol/L (ref 134–144)

## 2019-02-11 ENCOUNTER — Telehealth (HOSPITAL_COMMUNITY): Payer: Self-pay | Admitting: Emergency Medicine

## 2019-02-11 NOTE — Telephone Encounter (Signed)
Left message on voicemail with name and callback number Kayd Launer RN Navigator Cardiac Imaging [facility] [phone] Office [phone] Cell  

## 2019-02-11 NOTE — Telephone Encounter (Signed)
Pt returning phone call regarding upcoming cardiac imaging study; pt verbalizes understanding of appt date/time, parking situation and where to check in, pre-test NPO status and medications ordered, and verified current allergies; name and call back number provided for further questions should they arise Jared Bond RN Navigator Cardiac Imaging [facility] [phone] office [phone] cell  Pt denies covid symptoms

## 2019-02-12 ENCOUNTER — Other Ambulatory Visit: Payer: Self-pay

## 2019-02-12 ENCOUNTER — Ambulatory Visit (HOSPITAL_COMMUNITY)
Admission: RE | Admit: 2019-02-12 | Discharge: 2019-02-12 | Disposition: A | Discharge status: Home / Self Care | Payer: Medicare HMO | Source: Ambulatory Visit | Attending: Cardiovascular Disease | Admitting: Cardiovascular Disease

## 2019-02-12 DIAGNOSIS — R079 Chest pain, unspecified: Secondary | ICD-10-CM | POA: Insufficient documentation

## 2019-02-12 DIAGNOSIS — I2510 Atherosclerotic heart disease of native coronary artery without angina pectoris: Secondary | ICD-10-CM | POA: Diagnosis not present

## 2019-02-12 DIAGNOSIS — I119 Hypertensive heart disease without heart failure: Secondary | ICD-10-CM | POA: Diagnosis present

## 2019-02-12 MED ORDER — NITROGLYCERIN 0.4 MG SL SUBL
0.8000 mg | SUBLINGUAL_TABLET | Freq: Once | SUBLINGUAL | Status: AC
  Administered 2019-02-12: 0.8 mg via SUBLINGUAL

## 2019-02-12 MED ORDER — METOPROLOL TARTRATE 5 MG/5ML IV SOLN
10.0000 mg | Freq: Once | INTRAVENOUS | Status: AC
  Administered 2019-02-12: 10 mg via INTRAVENOUS

## 2019-02-12 MED ORDER — NITROGLYCERIN 0.4 MG SL SUBL: 0.8000 mg | SUBLINGUAL_TABLET | SUBLINGUAL | Status: DC | PRN

## 2019-02-12 MED ORDER — NITROGLYCERIN 0.4 MG SL SUBL
SUBLINGUAL_TABLET | SUBLINGUAL | Status: AC
  Filled 2019-02-12: qty 2

## 2019-02-12 MED ORDER — METOPROLOL TARTRATE 5 MG/5ML IV SOLN
INTRAVENOUS | Status: AC
  Filled 2019-02-12: qty 5

## 2019-02-12 MED ORDER — IOHEXOL 350 MG/ML SOLN
80.0000 mL | Freq: Once | INTRAVENOUS | Status: AC | PRN
  Administered 2019-02-12: 80 mL via INTRAVENOUS

## 2019-02-17 ENCOUNTER — Telehealth: Payer: Self-pay | Admitting: Cardiovascular Disease

## 2019-02-17 DIAGNOSIS — I25118 Atherosclerotic heart disease of native coronary artery with other forms of angina pectoris: Secondary | ICD-10-CM

## 2019-02-17 DIAGNOSIS — R079 Chest pain, unspecified: Secondary | ICD-10-CM

## 2019-02-17 NOTE — Telephone Encounter (Signed)
Pt had an abn. Coronary CT angio I discussed cath with patient  Including risks, beneftis, options,   He understands and agrees to proceed.  He has NTG.  Will set up cath for this week or ASAP    Mertie Moores, MD  02/17/2019 5:00 PM    [place],  [place], [place]  [address] Pager [phone] Phone: [phone]; Fax: [phone]

## 2019-02-19 ENCOUNTER — Other Ambulatory Visit: Payer: Medicare HMO

## 2019-02-19 ENCOUNTER — Other Ambulatory Visit: Payer: Self-pay

## 2019-02-19 DIAGNOSIS — I25118 Atherosclerotic heart disease of native coronary artery with other forms of angina pectoris: Secondary | ICD-10-CM | POA: Diagnosis not present

## 2019-02-19 DIAGNOSIS — R079 Chest pain, unspecified: Secondary | ICD-10-CM

## 2019-02-19 NOTE — Telephone Encounter (Signed)
   Jared Roberts  02/19/2019  You are scheduled for a Cardiac Catheterization on Tuesday, August 4 with Dr. [name].  1. Please arrive at the [facility] (Main Entrance A) at [facility]: [address] [place], [place] [address] at 5:30 AM (This time is two hours before your procedure to ensure your preparation). Free valet parking service is available.   Special note: Every effort is made to have your procedure done on time. Please understand that emergencies sometimes delay scheduled procedures.  2. Diet: Do not eat solid foods after midnight.  The patient may have clear liquids until 5am upon the day of the procedure.  3. Labs: You will need to have blood drawn on Wednesday, July 29 at [facility] at [facility]. [address]  Open: 7:30am - 5pm    Phone: [phone]. You do not need to be fasting.  4. Medication instructions in preparation for your procedure:   Contrast Allergy: No   Stop taking, HTCZ (Hydrochlorothiazide) Monday, August 3,    On the morning of your procedure, take your Aspirin and any morning medicines NOT listed above.  You may use sips of water.  5. Plan for one night stay--bring personal belongings. 6. Bring a current list of your medications and current insurance cards. 7. You MUST have a responsible person to drive you home. 8. Someone MUST be with you the first 24 hours after you arrive home or your discharge will be delayed. 9. Please wear clothes that are easy to get on and off and wear slip-on shoes.  Thank you for allowing [place] to care for you!   -- [facility]

## 2019-02-19 NOTE — Addendum Note (Signed)
Addended by: Emmaline Life on: 02/19/2019 10:44 AM   Modules accepted: Orders

## 2019-02-20 ENCOUNTER — Other Ambulatory Visit (HOSPITAL_COMMUNITY): Payer: Medicare HMO

## 2019-02-20 LAB — CBC
Hematocrit: 48.1 % (ref 37.5–51.0)
Hemoglobin: 16.7 g/dL (ref 13.0–17.7)
MCH: 30.9 pg (ref 26.6–33.0)
MCHC: 34.7 g/dL (ref 31.5–35.7)
MCV: 89 fL (ref 79–97)
Platelets: 252 10*3/uL (ref 150–450)
RBC: 5.4 x10E6/uL (ref 4.14–5.80)
RDW: 12.4 % (ref 11.6–15.4)
WBC: 9.7 10*3/uL (ref 3.4–10.8)

## 2019-02-20 LAB — BASIC METABOLIC PANEL
BUN/Creatinine Ratio: 14 (ref 10–24)
BUN: 17 mg/dL (ref 8–27)
CO2: 22 mmol/L (ref 20–29)
Calcium: 9.8 mg/dL (ref 8.6–10.2)
Chloride: 103 mmol/L (ref 96–106)
Creatinine, Ser: 1.18 mg/dL (ref 0.76–1.27)
GFR calc Af Amer: 69 mL/min/{1.73_m2} (ref >59)
GFR calc non Af Amer: 60 mL/min/{1.73_m2} (ref >59)
Glucose: 91 mg/dL (ref 65–99)
Potassium: 3.9 mmol/L (ref 3.5–5.2)
Sodium: 140 mmol/L (ref 134–144)

## 2019-02-22 ENCOUNTER — Other Ambulatory Visit (HOSPITAL_COMMUNITY)
Admission: RE | Admit: 2019-02-22 | Discharge: 2019-02-22 | Disposition: A | Discharge status: Home / Self Care | Payer: Medicare HMO | Source: Ambulatory Visit | Attending: Cardiovascular Disease | Admitting: Cardiovascular Disease

## 2019-02-22 DIAGNOSIS — Z01812 Encounter for preprocedural laboratory examination: Secondary | ICD-10-CM | POA: Diagnosis not present

## 2019-02-22 DIAGNOSIS — Z20828 Contact with and (suspected) exposure to other viral communicable diseases: Secondary | ICD-10-CM | POA: Diagnosis not present

## 2019-02-22 LAB — SARS CORONAVIRUS 2 (TAT 6-24 HRS): SARS Coronavirus 2: NEGATIVE

## 2019-02-24 ENCOUNTER — Telehealth: Payer: Self-pay | Admitting: *Deleted

## 2019-02-24 NOTE — Telephone Encounter (Signed)
Pt contacted pre-catheterization scheduled at [facility] for: Tuesday August 4, 202 7:30 AM Verified arrival time and place: [facility] Entrance A at: 5:30 AM  Covid-19 test date: 02/22/19   No solid food after midnight prior to cath, clear liquids until 5 AM day of procedure. Contrast allergy: no  Hold: HCTZ-AM of procedure  Except hold medications AM meds can be  taken pre-cath with sip of water including: ASA 81 mg   Confirmed patient has responsible person to drive home post procedure and observe 24 hours after arriving home: yes  Due to Covid-19 pandemic, only one support person will be allowed with patient. Must be the same support person for that patient's entire stay, will be screened and required to wear a mask.   Patients are required to wear a mask when they enter the hospital.       COVID-19 Pre-Screening Questions:  . In the past 7 to 10 days have you had a cough,  shortness of breath, headache, congestion, fever (100 or greater) body aches, chills, sore throat, or sudden loss of taste or sense of smell? no . Have you been around anyone with known Covid 19? no . Have you been around anyone who is awaiting Covid 19 test results in the past 7 to 10 days? no . Have you been around anyone who has been exposed to Covid 19, or has mentioned symptoms of Covid 19 within the past 7 to 10 days? no   I reviewed procedure/mask/visitor instructions, Covid-19 screening questions with patient, he verbalized understanding, thanked me for call.

## 2019-02-25 ENCOUNTER — Encounter (HOSPITAL_COMMUNITY)
Admission: RE | Disposition: A | Discharge status: Home / Self Care | Payer: Medicare HMO | Source: Home / Self Care | Attending: Cardiovascular Disease

## 2019-02-25 ENCOUNTER — Encounter (HOSPITAL_COMMUNITY): Payer: Self-pay | Admitting: Cardiovascular Disease

## 2019-02-25 ENCOUNTER — Ambulatory Visit (HOSPITAL_COMMUNITY)
Admission: RE | Admit: 2019-02-25 | Discharge: 2019-02-25 | Disposition: A | Discharge status: Home / Self Care | Payer: Medicare HMO | Attending: Cardiovascular Disease | Admitting: Cardiovascular Disease

## 2019-02-25 ENCOUNTER — Other Ambulatory Visit: Payer: Self-pay

## 2019-02-25 DIAGNOSIS — K219 Gastro-esophageal reflux disease without esophagitis: Secondary | ICD-10-CM | POA: Diagnosis not present

## 2019-02-25 DIAGNOSIS — I25118 Atherosclerotic heart disease of native coronary artery with other forms of angina pectoris: Secondary | ICD-10-CM | POA: Diagnosis not present

## 2019-02-25 DIAGNOSIS — Z8546 Personal history of malignant neoplasm of prostate: Secondary | ICD-10-CM | POA: Diagnosis not present

## 2019-02-25 DIAGNOSIS — I25119 Atherosclerotic heart disease of native coronary artery with unspecified angina pectoris: Secondary | ICD-10-CM | POA: Insufficient documentation

## 2019-02-25 DIAGNOSIS — Z79899 Other long term (current) drug therapy: Secondary | ICD-10-CM | POA: Insufficient documentation

## 2019-02-25 DIAGNOSIS — I208 Other forms of angina pectoris: Secondary | ICD-10-CM | POA: Diagnosis not present

## 2019-02-25 DIAGNOSIS — F329 Major depressive disorder, single episode, unspecified: Secondary | ICD-10-CM | POA: Diagnosis not present

## 2019-02-25 DIAGNOSIS — E785 Hyperlipidemia, unspecified: Secondary | ICD-10-CM | POA: Diagnosis not present

## 2019-02-25 DIAGNOSIS — R079 Chest pain, unspecified: Secondary | ICD-10-CM | POA: Diagnosis present

## 2019-02-25 DIAGNOSIS — Z8719 Personal history of other diseases of the digestive system: Secondary | ICD-10-CM | POA: Insufficient documentation

## 2019-02-25 DIAGNOSIS — Z791 Long term (current) use of non-steroidal anti-inflammatories (NSAID): Secondary | ICD-10-CM | POA: Insufficient documentation

## 2019-02-25 DIAGNOSIS — Z85820 Personal history of malignant melanoma of skin: Secondary | ICD-10-CM | POA: Diagnosis not present

## 2019-02-25 DIAGNOSIS — Z7982 Long term (current) use of aspirin: Secondary | ICD-10-CM | POA: Insufficient documentation

## 2019-02-25 DIAGNOSIS — I119 Hypertensive heart disease without heart failure: Secondary | ICD-10-CM | POA: Insufficient documentation

## 2019-02-25 DIAGNOSIS — R0600 Dyspnea, unspecified: Secondary | ICD-10-CM | POA: Insufficient documentation

## 2019-02-25 DIAGNOSIS — R69 Illness, unspecified: Secondary | ICD-10-CM | POA: Diagnosis not present

## 2019-02-25 DIAGNOSIS — K449 Diaphragmatic hernia without obstruction or gangrene: Secondary | ICD-10-CM | POA: Insufficient documentation

## 2019-02-25 HISTORY — PX: LEFT HEART CATH AND CORONARY ANGIOGRAPHY: CATH118249

## 2019-02-25 SURGERY — LEFT HEART CATH AND CORONARY ANGIOGRAPHY
Anesthesia: LOCAL

## 2019-02-25 MED ORDER — SODIUM CHLORIDE 0.9% FLUSH: 3.0000 mL | Freq: Two times a day (BID) | INTRAVENOUS | Status: DC

## 2019-02-25 MED ORDER — NITROGLYCERIN 1 MG/10 ML FOR IR/CATH LAB
INTRA_ARTERIAL | Status: AC
  Filled 2019-02-25: qty 10

## 2019-02-25 MED ORDER — ONDANSETRON HCL 4 MG/2ML IJ SOLN: 4.0000 mg | Freq: Four times a day (QID) | INTRAMUSCULAR | Status: DC | PRN

## 2019-02-25 MED ORDER — SODIUM CHLORIDE 0.9 % IV SOLN: 250.0000 mL | INTRAVENOUS | Status: DC | PRN

## 2019-02-25 MED ORDER — SODIUM CHLORIDE 0.9% FLUSH: 3.0000 mL | INTRAVENOUS | Status: DC | PRN

## 2019-02-25 MED ORDER — MIDAZOLAM HCL 2 MG/2ML IJ SOLN
INTRAMUSCULAR | Status: AC
  Filled 2019-02-25: qty 2

## 2019-02-25 MED ORDER — FENTANYL CITRATE (PF) 100 MCG/2ML IJ SOLN
INTRAMUSCULAR | Status: AC
  Filled 2019-02-25: qty 2

## 2019-02-25 MED ORDER — HEPARIN (PORCINE) IN NACL 1000-0.9 UT/500ML-% IV SOLN
INTRAVENOUS | Status: DC | PRN
  Administered 2019-02-25 (×2): 500 mL

## 2019-02-25 MED ORDER — VERAPAMIL HCL 2.5 MG/ML IV SOLN
INTRAVENOUS | Status: AC
  Filled 2019-02-25: qty 2

## 2019-02-25 MED ORDER — NITROGLYCERIN 1 MG/10 ML FOR IR/CATH LAB
INTRA_ARTERIAL | Status: DC | PRN
  Administered 2019-02-25: 200 ug via INTRACORONARY

## 2019-02-25 MED ORDER — VERAPAMIL HCL 2.5 MG/ML IV SOLN
INTRAVENOUS | Status: DC | PRN
  Administered 2019-02-25: 10 mL via INTRA_ARTERIAL

## 2019-02-25 MED ORDER — FENTANYL CITRATE (PF) 100 MCG/2ML IJ SOLN
INTRAMUSCULAR | Status: DC | PRN
  Administered 2019-02-25: 25 ug via INTRAVENOUS

## 2019-02-25 MED ORDER — HEPARIN (PORCINE) IN NACL 1000-0.9 UT/500ML-% IV SOLN
INTRAVENOUS | Status: AC
  Filled 2019-02-25: qty 1000

## 2019-02-25 MED ORDER — SODIUM CHLORIDE 0.9 % IV SOLN: INTRAVENOUS | Status: DC

## 2019-02-25 MED ORDER — MIDAZOLAM HCL 2 MG/2ML IJ SOLN
INTRAMUSCULAR | Status: DC | PRN
  Administered 2019-02-25: 1 mg via INTRAVENOUS

## 2019-02-25 MED ORDER — SODIUM CHLORIDE 0.9 % WEIGHT BASED INFUSION: 1.0000 mL/kg/h | INTRAVENOUS | Status: DC

## 2019-02-25 MED ORDER — SODIUM CHLORIDE 0.9 % WEIGHT BASED INFUSION
3.0000 mL/kg/h | INTRAVENOUS | Status: AC
  Administered 2019-02-25: 3 mL/kg/h via INTRAVENOUS

## 2019-02-25 MED ORDER — LIDOCAINE HCL (PF) 1 % IJ SOLN
INTRAMUSCULAR | Status: AC
  Filled 2019-02-25: qty 30

## 2019-02-25 MED ORDER — IOHEXOL 350 MG/ML SOLN
INTRAVENOUS | Status: DC | PRN
  Administered 2019-02-25: 90 mL via INTRA_ARTERIAL

## 2019-02-25 MED ORDER — LIDOCAINE HCL (PF) 1 % IJ SOLN
INTRAMUSCULAR | Status: DC | PRN
  Administered 2019-02-25: 2 mL

## 2019-02-25 MED ORDER — HEPARIN SODIUM (PORCINE) 1000 UNIT/ML IJ SOLN
INTRAMUSCULAR | Status: DC | PRN
  Administered 2019-02-25: 4000 [IU] via INTRAVENOUS

## 2019-02-25 MED ORDER — ACETAMINOPHEN 325 MG PO TABS: 650.0000 mg | ORAL_TABLET | ORAL | Status: DC | PRN

## 2019-02-25 MED ORDER — ASPIRIN 81 MG PO CHEW: 81.0000 mg | CHEWABLE_TABLET | ORAL | Status: DC

## 2019-02-25 SURGICAL SUPPLY — 13 items
CATH INFINITI 5 FR JL3.5 (CATHETERS) ×1 IMPLANT
CATH INFINITI 5FR ANG PIGTAIL (CATHETERS) ×1 IMPLANT
CATH INFINITI 5FR JK (CATHETERS) ×1 IMPLANT
COVER DOME SNAP 22 D (MISCELLANEOUS) ×1 IMPLANT
DEVICE RAD COMP TR BAND LRG (VASCULAR PRODUCTS) ×1 IMPLANT
GUIDEWIRE INQWIRE 1.5J.035X260 (WIRE) IMPLANT
INQWIRE 1.5J .035X260CM (WIRE) ×2
KIT HEART LEFT (KITS) ×2 IMPLANT
PACK CARDIAC CATHETERIZATION (CUSTOM PROCEDURE TRAY) ×2 IMPLANT
SHEATH RAIN RADIAL 21G 6FR (SHEATH) ×1 IMPLANT
SYR MEDRAD MARK 7 150ML (SYRINGE) ×2 IMPLANT
TRANSDUCER W/STOPCOCK (MISCELLANEOUS) ×2 IMPLANT
TUBING CIL FLEX 10 FLL-RA (TUBING) ×2 IMPLANT

## 2019-02-25 NOTE — Interval H&P Note (Signed)
Cath Lab Visit (complete for each Cath Lab visit)  Clinical Evaluation Leading to the Procedure:   ACS: No.  Non-ACS:    Anginal Classification: CCS III  Anti-ischemic medical therapy: Minimal Therapy (1 class of medications)  Non-Invasive Test Results: High-risk stress test findings: cardiac mortality >3%/year ( 3 vessel CAD on CTA)  Prior CABG: No previous CABG      History and Physical Interval Note:  02/25/2019 7:39 AM  Jared Roberts  has presented today for surgery, with the diagnosis of Abnormal CT.  The various methods of treatment have been discussed with the patient and family. After consideration of risks, benefits and other options for treatment, the patient has consented to  Procedure(s): LEFT HEART CATH AND CORONARY ANGIOGRAPHY (N/A) as a surgical intervention.  The patient's history has been reviewed, patient examined, no change in status, stable for surgery.  I have reviewed the patient's chart and labs.  Questions were answered to the patient's satisfaction.     Jared Roberts

## 2019-02-25 NOTE — Discharge Instructions (Signed)
Radial Site Care  This sheet gives you information about how to care for yourself after your procedure. Your health care provider may also give you more specific instructions. If you have problems or questions, contact your health care provider. What can I expect after the procedure? After the procedure, it is common to have:  Bruising and tenderness at the catheter insertion area. Follow these instructions at home: Medicines  Take over-the-counter and prescription medicines only as told by your health care provider. Insertion site care  Follow instructions from your health care provider about how to take care of your insertion site. Make sure you: ? Wash your hands with soap and water before you change your bandage (dressing). If soap and water are not available, use hand sanitizer. ? Change your dressing as told by your health care provider.  Check your insertion site every day for signs of infection. Check for: ? Redness, swelling, or pain. ? Fluid or blood. ? Pus or a bad smell. ? Warmth.  Do not take baths, swim, or use a hot tub for 5 days.  You may shower 24-48 hours after the procedure. ? Remove the dressing and gently wash the site with plain soap and water. ? Pat the area dry with a clean towel. ? Do not rub the site. That could cause bleeding.  Do not apply powder or lotion to the site. Activity   For 24 hours after the procedure, or as directed by your health care provider: ? Do not flex or bend the affected arm. ? Do not push or pull heavy objects with the affected arm. ? Do not drive yourself home from the hospital or clinic. You may drive 24 hours after the procedure unless your health care provider tells you not to. ? Do not operate machinery or power tools.  Do not lift anything that is heavier than 10 lb for 5 days.  Ask your health care provider when it is okay to: ? Return to work or school. ? Resume usual physical activities or sports. ? Resume sexual  activity. General instructions  If the catheter site starts to bleed, raise your arm and put firm pressure on the site. If the bleeding does not stop, get help right away. This is a medical emergency.  If you went home on the same day as your procedure, a responsible adult should be with you for the first 24 hours after you arrive home.  Keep all follow-up visits as told by your health care provider. This is important. Contact a health care provider if:  You have a fever.  You have redness, swelling, or yellow drainage around your insertion site. Get help right away if:  You have unusual pain at the radial site.  The catheter insertion area swells very fast.  The insertion area is bleeding, and the bleeding does not stop when you hold steady pressure on the area.  Your arm or hand becomes pale, cool, tingly, or numb.   These symptoms may represent a serious problem that is an emergency. Do not wait to see if the symptoms will go away. Get medical help right away. Call your local emergency services (911 in the [place] Do not drive yourself to the hospital. Summary  After the procedure, it is common to have bruising and tenderness at the site.  Follow instructions from your health care provider about how to take care of your radial site wound. Check the wound every day for signs of infection.  Do not  lift anything that is heavier than 10 lb (4.5 kg), or the limit that you are told, until your health care provider says that it is safe. This information is not intended to replace advice given to you by your health care provider. Make sure you discuss any questions you have with your health care provider. Document Released: 08/12/2010 Document Revised: 08/15/2017 Document Reviewed: 08/15/2017 Elsevier Patient Education  2020 [employer].

## 2019-02-26 NOTE — Progress Notes (Signed)
[address]       [place] [address]             [phone]        Zackerie C Heying [place] Medical Record #[id number] Date of Birth: [birth year]  Referring: [name], MD Primary Care: Deland Pretty, MD Primary Cardiologist:Philip Nahser, MD  Chief Complaint:    Chief Complaint  Patient presents with  . Coronary Artery Disease    Surgical eval, ECHO 02/06/19, Cardiac/Coronary  CTA 02/12/19, Cardiac Cath 02/25/19     History of Present Illness:     73 yo male presents for surgical evaluation of 3C CAD.  He has a long history of fatigue that ultimately lead to a cardiac work-up which found his coronary disease.  He does endorse exertional left arm and jaw pain.  He has also had a long history of reflux that he now thinks is associated with his coronary disease.  He has some exertional dyspnea, but denies any lower extremity swelling, orthopnea or PND.        Past Medical and Surgical History: Previous Chest Surgery: rib resection on the right for ear reconstruction Previous Chest Radiation: no Diabetes Mellitus: no.  HbA1C pending Creatinine: 1.18  Past Medical History:  Diagnosis Date  . Chest discomfort   . DDD (degenerative disc disease), cervical   . Depression   . Diarrhea   . Dizziness   . DOE (dyspnea on exertion)   . ED (erectile dysfunction) of organic origin   . Fatigue   . Generalized osteoarthritis   . GERD (gastroesophageal reflux disease)   . Hiatal hernia   . History of chronic bronchitis   . History of gastritis    2003  . History of Helicobacter pylori infection    2003  . History of kidney stones   . History of melanoma excision    2015--  right ear  . History of prostate cancer urologist-  dr [name]   2007--  pT2a Nx Mx,  Gleason 3+3, S/P  PROSTATECTOMY  . History of squamous cell carcinoma excision    2011  left knee  . Hx of headache   . Hx of hemorrhoids   . Hyperlipidemia   . Hyperplastic colon polyp 2009   . Hypertension   . Male hypogonadism   . Melanoma ([place])    left 4th toe  . Nephrolithiasis   . Palpitations   . Prostate cancer ([place])   . Rectal bleeding   . Shoulder pain, left   . Skin cancer   . Tinnitus   . Wears glasses     Past Surgical History:  Procedure Laterality Date  . AMPUTATION TOE Left 09/07/2015   Procedure: PARTIAL AMPUTATION TOE 4TH LEFT;  Surgeon: [name], MD;  Location: [facility];  Service: Podiatry;  Laterality: Left;  . APPENDECTOMY  1970's  . CYSTO/  CLOT EVACUATION POST PROSTATECTOMY  11-19-2005  . CYSTO/  REMOVAL URETHRAL STONE AND URETHRAL STAPLES  06-02-2010  . CYSTO/  URETEROSCOPIC STONE EXTRACTION  X2  1990's  . KIDNEY STONE SURGERY    . LEFT HEART CATH AND CORONARY ANGIOGRAPHY N/A 02/25/2019   Procedure: LEFT HEART CATH AND CORONARY ANGIOGRAPHY;  Surgeon: [name], MD;  Location: [facility];  Service: Cardiovascular;  Laterality: N/A;  . MELANOMA EXCISION  2015   right ear  . ROBOT ASSISTED LAPAROSCOPIC RADICAL PROSTATECTOMY  11-09-2005  . SKIN CANCER EXCISION    .  TONSILLECTOMY  child    Social History: Support: lives with wife  Social History   Tobacco Use  Smoking Status Never Smoker  Smokeless Tobacco Never Used    Social History   Substance and Sexual Activity  Alcohol Use Yes  . Alcohol/week: 7.0 standard drinks  . Types: 7 Shots of liquor per week   Comment: 1 drink daily     No Known Allergies  Medications: Asprin: yes Statin: yes Beta Blocker: no Ace Inhibitor: ARB Anti-Coagulation: no  Current Outpatient Medications  Medication Sig Dispense Refill  . ascorbic acid (VITAMIN C) 1000 MG tablet Take 1,000 mg by mouth daily.    Marland Kitchen aspirin EC 81 MG tablet Take 81 mg by mouth at bedtime.     Marland Kitchen atorvastatin (LIPITOR) 10 MG tablet Take 10 mg by mouth at bedtime.     . calcium carbonate (TUMS - DOSED IN MG ELEMENTAL CALCIUM) 500 MG chewable tablet Chew 1-2 tablets by mouth 3 (three) times  daily as needed for indigestion or heartburn.     . Cholecalciferol (VITAMIN D) 2000 units CAPS Take 2,000 Units by mouth 2 (two) times a day.     . hydrochlorothiazide (HYDRODIURIL) 25 MG tablet Take 25 mg by mouth daily. IN THE MORNING    . Multiple Vitamin (MULTIVITAMIN WITH MINERALS) TABS tablet Take 1 tablet by mouth daily.    . nitroGLYCERIN (NITROSTAT) 0.4 MG SL tablet Place 1 tablet (0.4 mg total) under the tongue every 5 (five) minutes as needed for chest pain. 25 tablet 6  . olmesartan (BENICAR) 40 MG tablet Take 40 mg by mouth daily.     . Probiotic Product (PROBIOTIC DAILY PO) Take 1 capsule by mouth daily.     . verapamil (VERELAN PM) 240 MG 24 hr capsule Take 240 mg by mouth at bedtime.     No current facility-administered medications for this visit.     (Not in a hospital admission)   Family History  Problem Relation Age of Onset  . Arthritis Mother   . Diabetes Mellitus II Mother   . Congestive Heart Failure Mother   . Osteoarthritis Mother        CHRONIC  . Other Father        KILLED IN WWII     Review of Systems:   Review of Systems  Constitutional: Positive for malaise/fatigue.  HENT: Negative.   Eyes: Negative.   Respiratory: Negative.   Cardiovascular: Positive for palpitations and claudication. Negative for chest pain, orthopnea, leg swelling and PND.  Gastrointestinal: Positive for heartburn. Negative for abdominal pain, diarrhea and vomiting.  Musculoskeletal: Negative.   Skin: Negative.   Neurological:       Mental fog   Physical Exam: BP 113/77   Pulse (!) 110   Temp 97.6 F (36.4 C) (Skin)   Resp 20   Ht 5\' 10"  (1.778 m)   Wt 180 lb (81.6 kg)   SpO2 95% Comment: RA  BMI 25.83 kg/m  General:  Appears stated age.  nontoxic HEENT: NCAT Neuro: Nonfocal, alert, oriented.   Psych: appropriate affect.  Cardiovascular: RRR, no murmur, nocarotid bruit, good peripheral pulses  Pulmonary: EWOB, clear breath sounds GI: NT/ND MSK: ambulates  well. Extremities: no peripheral edema     Diagnostic Studies & Laboratory data:    Left Heart Catherization:  The left ventricular systolic function is normal.  LV end diastolic pressure is normal.  The left ventricular ejection fraction is 55-65% by visual estimate.  Prox RCA  lesion is 90% stenosed.  Prox LAD to Mid LAD lesion is 60% stenosed.  Mid LAD lesion is 80% stenosed.  Mid Cx lesion is 80% stenosed.    Echo: - Left Ventricle: The left ventricle has hyperdynamic systolic function, with an ejection fraction of >65%. The cavity size was normal. There is no increase in left ventricular wall thickness. Left ventricular diastolic Doppler parameters are consistent  with impaired relaxation. - Right Ventricle: The right ventricle has normal systolic function. The cavity was normal. There is no increase in right ventricular wall thickness. - Mitral Valve: The mitral valve is grossly normal. Mitral valve regurgitation is trivial by color flow Doppler. - Tricuspid Valve: The tricuspid valve is normal in structure. Tricuspid valve regurgitation was not visualized by color flow Doppler. - Aortic Valve: The aortic valve is tricuspid Mild thickening of the aortic valve. Mild calcification of the aortic valve. Aortic valve regurgitation was not visualized by color flow Doppler. - Pulmonic Valve: The pulmonic valve was grossly normal. Pulmonic valve regurgitation is trivial by color flow Doppler.     I have independently reviewed the above radiologic studies and discussed with the patient   Recent Lab Findings: Lab Results  Component Value Date   WBC 9.7 02/19/2019   HGB 16.7 02/19/2019   HCT 48.1 02/19/2019   PLT 252 02/19/2019   GLUCOSE 91 02/19/2019   NA 140 02/19/2019   K 3.9 02/19/2019   CL 103 02/19/2019   CREATININE 1.18 02/19/2019   BUN 17 02/19/2019   CO2 22 02/19/2019   TSH 3.520 02/13/2018      Assessment / Plan:   73 yo male with progressive chest  pain.  Underwent a LHC which showed significant 3V CAD.  Normal LV and RV function, and no valvular disease.  LAD, and PDA targets are good caliber.  The OM2 target is small, but the distal circ is adequate.   Will need carotid US as part of eval.  CABG 3 in the next 1-2 weeks    I  spent 40 minutes counseling the patient face to face.   Ruford Dudzinski O Hurshell Dino\ 02/28/2019 4:11 PM

## 2019-02-28 ENCOUNTER — Encounter: Payer: Self-pay | Admitting: Thoracic Surgery (Cardiothoracic Vascular Surgery)

## 2019-02-28 ENCOUNTER — Institutional Professional Consult (permissible substitution): Payer: Medicare HMO | Admitting: Thoracic Surgery (Cardiothoracic Vascular Surgery)

## 2019-02-28 ENCOUNTER — Other Ambulatory Visit: Payer: Self-pay

## 2019-02-28 VITALS — BP 113/77 | HR 110 | Temp 97.6°F | Resp 20 | Ht 70.0 in | Wt 180.0 lb

## 2019-02-28 DIAGNOSIS — I25118 Atherosclerotic heart disease of native coronary artery with other forms of angina pectoris: Secondary | ICD-10-CM | POA: Diagnosis not present

## 2019-02-28 DIAGNOSIS — I208 Other forms of angina pectoris: Secondary | ICD-10-CM | POA: Diagnosis not present

## 2019-02-28 DIAGNOSIS — I10 Essential (primary) hypertension: Secondary | ICD-10-CM

## 2019-03-03 ENCOUNTER — Other Ambulatory Visit: Payer: Self-pay

## 2019-03-03 DIAGNOSIS — I25119 Atherosclerotic heart disease of native coronary artery with unspecified angina pectoris: Secondary | ICD-10-CM

## 2019-03-04 ENCOUNTER — Other Ambulatory Visit: Payer: Self-pay

## 2019-03-04 ENCOUNTER — Ambulatory Visit (HOSPITAL_COMMUNITY)
Admission: RE | Admit: 2019-03-04 | Discharge: 2019-03-04 | Disposition: A | Discharge status: Home / Self Care | Payer: Medicare HMO | Source: Ambulatory Visit | Attending: Thoracic Surgery (Cardiothoracic Vascular Surgery) | Admitting: Thoracic Surgery (Cardiothoracic Vascular Surgery)

## 2019-03-04 DIAGNOSIS — I25119 Atherosclerotic heart disease of native coronary artery with unspecified angina pectoris: Secondary | ICD-10-CM | POA: Diagnosis not present

## 2019-03-04 LAB — PULMONARY FUNCTION TEST
DL/VA % pred: 75 %
DL/VA: 2.99 ml/min/mmHg/L
DLCO cor % pred: 83 %
DLCO cor: 20.95 ml/min/mmHg
DLCO unc % pred: 87 %
DLCO unc: 22.09 ml/min/mmHg
FEF 25-75 Post: 4.41 L/sec
FEF 25-75 Pre: 4.23 L/sec
FEF2575-%Change-Post: 4 %
FEF2575-%Pred-Post: 198 %
FEF2575-%Pred-Pre: 190 %
FEV1-%Change-Post: 2 %
FEV1-%Pred-Post: 130 %
FEV1-%Pred-Pre: 126 %
FEV1-Post: 3.99 L
FEV1-Pre: 3.89 L
FEV1FVC-%Change-Post: 1 %
FEV1FVC-%Pred-Pre: 111 %
FEV6-%Change-Post: 1 %
FEV6-%Pred-Post: 121 %
FEV6-%Pred-Pre: 119 %
FEV6-Post: 4.82 L
FEV6-Pre: 4.77 L
FEV6FVC-%Change-Post: 0 %
FEV6FVC-%Pred-Post: 106 %
FEV6FVC-%Pred-Pre: 106 %
FVC-%Change-Post: 0 %
FVC-%Pred-Post: 113 %
FVC-%Pred-Pre: 112 %
FVC-Post: 4.82 L
FVC-Pre: 4.78 L
Post FEV1/FVC ratio: 83 %
Post FEV6/FVC ratio: 100 %
Pre FEV1/FVC ratio: 81 %
Pre FEV6/FVC Ratio: 100 %
RV % pred: 117 %
RV: 3 L
TLC % pred: 111 %
TLC: 7.83 L

## 2019-03-04 MED ORDER — ALBUTEROL SULFATE (2.5 MG/3ML) 0.083% IN NEBU
2.5000 mg | INHALATION_SOLUTION | Freq: Once | RESPIRATORY_TRACT | Status: AC
  Administered 2019-03-04: 14:00:00 2.5 mg via RESPIRATORY_TRACT

## 2019-03-04 NOTE — Progress Notes (Signed)
[place], [place] [address] Phone: [phone] Fax: [phone]      Your procedure is scheduled on Wednesday, 03/12/19.  Report to [facility] Main Entrance "A" at 6:30 A.M., and check in at the Admitting office.  Call this number if you have problems the morning of surgery:  [phone]  Call [phone] if you have any questions prior to your surgery date Monday-Friday 8am-4pm    Remember:  Do not eat or drink after midnight the night before your surgery (Tues)   Take these medicines the morning of surgery with A SIP OF WATER: nitroGLYCERIN (NITROSTAT) if needed  7 days prior to surgery STOP taking any Aspirin (unless otherwise instructed by your surgeon), Aleve, Naproxen, Ibuprofen, Motrin, Advil, Goody's, BC's, all herbal medications, fish oil, and all vitamins.    The Morning of Surgery  Do not wear jewelry, make-up or nail polish.  Do not wear lotions, powders, or colognes, or deodorant  Do not shave 48 hours prior to surgery.  Men may shave face and neck.  Do not bring valuables to the hospital.  [facility] is not responsible for any belongings or valuables.  If you are a smoker, DO NOT Smoke 24 hours prior to surgery.  IF you wear a CPAP at night please bring your mask and tubing the morning of surgery.   Remember that you must have someone to transport you home after your surgery, and remain with you for 24 hours if you are discharged the same day.  Patients discharged the day of surgery will not be allowed to drive home.   Contacts, glasses, hearing aids, dentures or bridgework may not be worn into surgery.   For patients admitted to the hospital, discharge time will be determined by your treatment team.    Special instructions:   [facility]- Preparing For Surgery  Before surgery, you can play an important role. Because skin is not sterile, your skin needs to be as  free of germs as possible. You can reduce the number of germs on your skin by washing with CHG (chlorahexidine gluconate) Soap before surgery.  CHG is an antiseptic cleaner which kills germs and bonds with the skin to continue killing germs even after washing.    Oral Hygiene is also important to reduce your risk of infection.  Remember - BRUSH YOUR TEETH THE MORNING OF SURGERY WITH YOUR REGULAR TOOTHPASTE  Please do not use if you have an allergy to CHG or antibacterial soaps. If your skin becomes reddened/irritated stop using the CHG.  Do not shave (including legs and underarms) for at least 48 hours prior to first CHG shower. It is OK to shave your face.  Please follow these instructions carefully.   1. Shower the [employer] BEFORE SURGERY (Tues) and the MORNING OF SURGERY (Wed) with CHG Soap.   2. If you chose to wash your hair, wash your hair first as usual with your normal shampoo.  3. After you shampoo, rinse your hair and body thoroughly to remove the shampoo.  4. Use CHG as you would any other liquid soap. You can apply CHG directly to the skin and wash gently with a scrungie or a clean washcloth.   5. Apply the CHG Soap to your body ONLY FROM THE NECK DOWN.  Do not use on open wounds or open sores. Avoid contact with your eyes, ears, mouth and genitals (private parts). Wash Face and  genitals (private parts)  with your normal soap.   6. Wash thoroughly, paying special attention to the area where your surgery will be performed.  7. Thoroughly rinse your body with warm water from the neck down.  8. DO NOT shower/wash with your normal soap after using and rinsing off the CHG Soap.  9. Pat yourself dry with a CLEAN TOWEL.  10. Wear CLEAN PAJAMAS to bed the night before surgery, wear comfortable clothes the morning of surgery  11. Place CLEAN SHEETS on your bed the night of your first shower and DO NOT SLEEP WITH PETS.    Day of Surgery:  Do not apply any deodorants/lotions. Please  shower the morning of surgery with the CHG soap  Please wear clean clothes to the hospital/surgery center.   Remember to brush your teeth WITH YOUR REGULAR TOOTHPASTE.   Please read over the following fact sheets that you were given.

## 2019-03-05 ENCOUNTER — Other Ambulatory Visit: Payer: Self-pay

## 2019-03-05 ENCOUNTER — Ambulatory Visit (HOSPITAL_COMMUNITY)
Admission: RE | Admit: 2019-03-05 | Discharge: 2019-03-05 | Disposition: A | Discharge status: Home / Self Care | Payer: Medicare HMO | Source: Ambulatory Visit | Attending: Thoracic Surgery (Cardiothoracic Vascular Surgery) | Admitting: Thoracic Surgery (Cardiothoracic Vascular Surgery)

## 2019-03-05 ENCOUNTER — Encounter (HOSPITAL_COMMUNITY)
Admission: RE | Admit: 2019-03-05 | Discharge: 2019-03-05 | Disposition: A | Discharge status: Home / Self Care | Payer: Medicare HMO | Source: Ambulatory Visit | Attending: Thoracic Surgery (Cardiothoracic Vascular Surgery) | Admitting: Thoracic Surgery (Cardiothoracic Vascular Surgery)

## 2019-03-05 ENCOUNTER — Encounter (HOSPITAL_COMMUNITY): Payer: Self-pay

## 2019-03-05 DIAGNOSIS — I6523 Occlusion and stenosis of bilateral carotid arteries: Secondary | ICD-10-CM | POA: Diagnosis not present

## 2019-03-05 DIAGNOSIS — I25119 Atherosclerotic heart disease of native coronary artery with unspecified angina pectoris: Secondary | ICD-10-CM

## 2019-03-05 DIAGNOSIS — I2510 Atherosclerotic heart disease of native coronary artery without angina pectoris: Secondary | ICD-10-CM | POA: Diagnosis not present

## 2019-03-05 DIAGNOSIS — I10 Essential (primary) hypertension: Secondary | ICD-10-CM | POA: Insufficient documentation

## 2019-03-05 DIAGNOSIS — E785 Hyperlipidemia, unspecified: Secondary | ICD-10-CM | POA: Insufficient documentation

## 2019-03-05 HISTORY — DX: Atherosclerotic heart disease of native coronary artery without angina pectoris: I25.10

## 2019-03-05 LAB — COMPREHENSIVE METABOLIC PANEL
ALT: 20 U/L (ref 0–44)
AST: 22 U/L (ref 15–41)
Albumin: 4.2 g/dL (ref 3.5–5.0)
Alkaline Phosphatase: 58 U/L (ref 38–126)
Anion gap: 12 (ref 5–15)
BUN: 22 mg/dL (ref 8–23)
CO2: 17 mmol/L — ABNORMAL LOW (ref 22–32)
Calcium: 9.7 mg/dL (ref 8.9–10.3)
Chloride: 109 mmol/L (ref 98–111)
Creatinine, Ser: 1.08 mg/dL (ref 0.61–1.24)
GFR calc Af Amer: >60 mL/min (ref >60)
GFR calc non Af Amer: >60 mL/min (ref >60)
Glucose, Bld: 102 mg/dL — ABNORMAL HIGH (ref 70–99)
Potassium: 3.8 mmol/L (ref 3.5–5.1)
Sodium: 138 mmol/L (ref 135–145)
Total Bilirubin: 1.1 mg/dL (ref 0.3–1.2)
Total Protein: 7 g/dL (ref 6.5–8.1)

## 2019-03-05 LAB — CBC
HCT: 46.4 % (ref 39.0–52.0)
Hemoglobin: 16.4 g/dL (ref 13.0–17.0)
MCH: 31.7 pg (ref 26.0–34.0)
MCHC: 35.3 g/dL (ref 30.0–36.0)
MCV: 89.7 fL (ref 80.0–100.0)
Platelets: 166 10*3/uL (ref 150–400)
RBC: 5.17 MIL/uL (ref 4.22–5.81)
RDW: 11.9 % (ref 11.5–15.5)
WBC: 9.1 10*3/uL (ref 4.0–10.5)
nRBC: 0 % (ref 0.0–0.2)

## 2019-03-05 LAB — TYPE AND SCREEN
ABO/RH(D): A POS
Antibody Screen: NEGATIVE

## 2019-03-05 LAB — BLOOD GAS, ARTERIAL
Acid-base deficit: 1.8 mmol/L (ref 0.0–2.0)
Bicarbonate: 21.7 mmol/L (ref 20.0–28.0)
Drawn by: 470591
FIO2: 0.21
O2 Saturation: 95.8 %
Patient temperature: 98.6
pCO2 arterial: 32.1 mmHg (ref 32.0–48.0)
pH, Arterial: 7.445 (ref 7.350–7.450)
pO2, Arterial: 78.7 mmHg — ABNORMAL LOW (ref 83.0–108.0)

## 2019-03-05 LAB — URINALYSIS, ROUTINE W REFLEX MICROSCOPIC
Bilirubin Urine: NEGATIVE
Glucose, UA: NEGATIVE mg/dL
Hgb urine dipstick: NEGATIVE
Ketones, ur: NEGATIVE mg/dL
Leukocytes,Ua: NEGATIVE
Nitrite: NEGATIVE
Protein, ur: NEGATIVE mg/dL
Specific Gravity, Urine: 1.02 (ref 1.005–1.030)
pH: 6 (ref 5.0–8.0)

## 2019-03-05 LAB — PROTIME-INR
INR: 1 (ref 0.8–1.2)
Prothrombin Time: 13.4 seconds (ref 11.4–15.2)

## 2019-03-05 LAB — HEMOGLOBIN A1C
Hgb A1c MFr Bld: 5.1 % (ref 4.8–5.6)
Mean Plasma Glucose: 99.67 mg/dL

## 2019-03-05 LAB — SURGICAL PCR SCREEN
MRSA, PCR: NEGATIVE
Staphylococcus aureus: NEGATIVE

## 2019-03-05 LAB — APTT: aPTT: 46 seconds — ABNORMAL HIGH (ref 24–36)

## 2019-03-05 LAB — ABO/RH: ABO/RH(D): A POS

## 2019-03-05 NOTE — Progress Notes (Signed)
PCP - Dr. Deland Pretty Cardiologist - Dr. Mertie Moores  Chest x-ray - 03/05/2019 EKG - 03/05/2019 Stress Test - per patient "one year ago with Dr. Einar Gip nothing was wrong" ECHO - 02/06/2019 Cardiac Cath - 02/25/2019  Sleep Study - Yes, per patient "inconclusive" CPAP - denies  Blood Thinner Instructions: N/A Aspirin Instructions: Patient stated will read over instructions given by surgeon's office. Patient instructed to contact surgeon's office for further instructions if instructions not found. Patient verbalized understanding,  Anesthesia review: YES, heart hx  Coronavirus Screening  Have you experienced the following symptoms:  Cough yes/no: No Fever (>100.29F)  yes/no: No Runny nose yes/no: No Sore throat yes/no: No Difficulty breathing/shortness of breath  yes/no: No  Have you or a family member traveled in the last 14 days and where? yes/no: No  If the patient indicates "YES" to the above questions, their PAT will be rescheduled to limit the exposure to others and, the surgeon will be notified. THE PATIENT WILL NEED TO BE ASYMPTOMATIC FOR 14 DAYS.   If the patient is not experiencing any of these symptoms, the PAT nurse will instruct them to NOT bring anyone with them to their appointment since they may have these symptoms or traveled as well.   Please remind your patients and families that hospital visitation restrictions are in effect and the importance of the restrictions.   Patient denies shortness of breath, fever, cough and chest pain at PAT appointment  Patient verbalized understanding of instructions that were given to them at the PAT appointment. Patient was also instructed that they will need to review over the PAT instructions again at home before surgery.

## 2019-03-05 NOTE — Progress Notes (Signed)
[facility] Dr. Kipp Brood about patient's abnormal labs.

## 2019-03-05 NOTE — Progress Notes (Signed)
Precabg Doppler test has been completed. Refer to [facility] under chart review to view preliminary results.   03/05/2019  12:26 PM Wren Pryce, Bonnye Fava

## 2019-03-08 ENCOUNTER — Other Ambulatory Visit (HOSPITAL_COMMUNITY)
Admission: RE | Admit: 2019-03-08 | Discharge: 2019-03-08 | Disposition: A | Discharge status: Home / Self Care | Payer: Medicare HMO | Source: Ambulatory Visit | Attending: Thoracic Surgery (Cardiothoracic Vascular Surgery) | Admitting: Thoracic Surgery (Cardiothoracic Vascular Surgery)

## 2019-03-08 DIAGNOSIS — Z20828 Contact with and (suspected) exposure to other viral communicable diseases: Secondary | ICD-10-CM | POA: Diagnosis not present

## 2019-03-08 DIAGNOSIS — I25119 Atherosclerotic heart disease of native coronary artery with unspecified angina pectoris: Secondary | ICD-10-CM

## 2019-03-08 DIAGNOSIS — Z01812 Encounter for preprocedural laboratory examination: Secondary | ICD-10-CM | POA: Diagnosis not present

## 2019-03-08 LAB — SARS CORONAVIRUS 2 (TAT 6-24 HRS): SARS Coronavirus 2: NEGATIVE

## 2019-03-11 MED ORDER — NITROGLYCERIN IN D5W 200-5 MCG/ML-% IV SOLN
2.0000 ug/min | INTRAVENOUS | Status: AC
  Administered 2019-03-12: 09:00:00 16.6 ug/min via INTRAVENOUS
  Filled 2019-03-11: qty 250

## 2019-03-11 MED ORDER — DEXMEDETOMIDINE HCL IN NACL 400 MCG/100ML IV SOLN
0.1000 ug/kg/h | INTRAVENOUS | Status: AC
  Administered 2019-03-12: .3 ug/kg/h via INTRAVENOUS
  Filled 2019-03-11: qty 100

## 2019-03-11 MED ORDER — EPINEPHRINE HCL 5 MG/250ML IV SOLN IN NS
0.0000 ug/min | INTRAVENOUS | Status: DC
  Filled 2019-03-11: qty 250

## 2019-03-11 MED ORDER — VANCOMYCIN HCL 10 G IV SOLR
1250.0000 mg | INTRAVENOUS | Status: AC
  Administered 2019-03-12: 1250 mg via INTRAVENOUS
  Filled 2019-03-11: qty 1250

## 2019-03-11 MED ORDER — PLASMA-LYTE 148 IV SOLN
INTRAVENOUS | Status: DC
  Filled 2019-03-11: qty 2.5

## 2019-03-11 MED ORDER — DOPAMINE-DEXTROSE 3.2-5 MG/ML-% IV SOLN
0.0000 ug/kg/min | INTRAVENOUS | Status: DC
  Filled 2019-03-11: qty 250

## 2019-03-11 MED ORDER — INSULIN REGULAR(HUMAN) IN NACL 100-0.9 UT/100ML-% IV SOLN
INTRAVENOUS | Status: AC
  Administered 2019-03-12: 1 [IU]/h via INTRAVENOUS
  Filled 2019-03-11: qty 100

## 2019-03-11 MED ORDER — SODIUM CHLORIDE 0.9 % IV SOLN
INTRAVENOUS | Status: DC
  Filled 2019-03-11: qty 30

## 2019-03-11 MED ORDER — TRANEXAMIC ACID 1000 MG/10ML IV SOLN
1.5000 mg/kg/h | INTRAVENOUS | Status: AC
  Administered 2019-03-12: 1.5 mg/kg/h via INTRAVENOUS
  Filled 2019-03-11: qty 25

## 2019-03-11 MED ORDER — MILRINONE LACTATE IN DEXTROSE 20-5 MG/100ML-% IV SOLN
0.3000 ug/kg/min | INTRAVENOUS | Status: DC
  Filled 2019-03-11: qty 100

## 2019-03-11 MED ORDER — TRANEXAMIC ACID (OHS) PUMP PRIME SOLUTION
2.0000 mg/kg | INTRAVENOUS | Status: DC
  Filled 2019-03-11: qty 1.61

## 2019-03-11 MED ORDER — SODIUM CHLORIDE 0.9 % IV SOLN
1.5000 g | INTRAVENOUS | Status: AC
  Administered 2019-03-12: 1.5 g via INTRAVENOUS
  Filled 2019-03-11 (×2): qty 1.5

## 2019-03-11 MED ORDER — POTASSIUM CHLORIDE 2 MEQ/ML IV SOLN
80.0000 meq | INTRAVENOUS | Status: DC
  Filled 2019-03-11: qty 40

## 2019-03-11 MED ORDER — TRANEXAMIC ACID (OHS) BOLUS VIA INFUSION
15.0000 mg/kg | INTRAVENOUS | Status: AC
  Administered 2019-03-12: 1210.5 mg via INTRAVENOUS
  Filled 2019-03-11: qty 1211

## 2019-03-11 MED ORDER — PHENYLEPHRINE HCL-NACL 20-0.9 MG/250ML-% IV SOLN
30.0000 ug/min | INTRAVENOUS | Status: DC
  Filled 2019-03-11: qty 250

## 2019-03-11 MED ORDER — SODIUM CHLORIDE 0.9 % IV SOLN
750.0000 mg | INTRAVENOUS | Status: AC
  Administered 2019-03-12: 750 mg via INTRAVENOUS
  Filled 2019-03-11: qty 750

## 2019-03-11 MED ORDER — MANNITOL 20 % IV SOLN
INTRAVENOUS | Status: DC
  Filled 2019-03-11: qty 13

## 2019-03-11 NOTE — H&P (Signed)
[address]       [place] [address]             [phone]                                       Jared Roberts [place] Medical Record #[id number] Date of Birth: Feb 27, 1946  Referring: [name], MD Primary Care: Deland Pretty, MD Primary Cardiologist:Philip Nahser, MD  Chief Complaint:        Chief Complaint  Patient presents with  . Coronary Artery Disease    Surgical eval, ECHO 02/06/19, Cardiac/Coronary  CTA 02/12/19, Cardiac Cath 02/25/19     History of Present Illness:     Some anxiety, and chest pain this morning, improved with NG.  Otherwise no issues.  Per my last note 73 yo male presents for surgical evaluation of 3C CAD.  He has a long history of fatigue that ultimately lead to a cardiac work-up which found his coronary disease.  He does endorse exertional left arm and jaw pain.  He has also had a long history of reflux that he now thinks is associated with his coronary disease.  He has some exertional dyspnea, but denies any lower extremity swelling, orthopnea or PND.        Past Medical and Surgical History: Previous Chest Surgery: rib resection on the right for ear reconstruction Previous Chest Radiation: no Diabetes Mellitus: no.  HbA1C pending Creatinine: 1.18      Past Medical History:  Diagnosis Date  . Chest discomfort   . DDD (degenerative disc disease), cervical   . Depression   . Diarrhea   . Dizziness   . DOE (dyspnea on exertion)   . ED (erectile dysfunction) of organic origin   . Fatigue   . Generalized osteoarthritis   . GERD (gastroesophageal reflux disease)   . Hiatal hernia   . History of chronic bronchitis   . History of gastritis    2003  . History of Helicobacter pylori infection    2003  . History of kidney stones   . History of melanoma excision    2015--  right ear  . History of prostate cancer urologist-  dr [name]   2007--  pT2a Nx Mx,  Gleason 3+3, S/P   PROSTATECTOMY  . History of squamous cell carcinoma excision    2011  left knee  . Hx of headache   . Hx of hemorrhoids   . Hyperlipidemia   . Hyperplastic colon polyp 2009  . Hypertension   . Male hypogonadism   . Melanoma ([place])    left 4th toe  . Nephrolithiasis   . Palpitations   . Prostate cancer ([place])   . Rectal bleeding   . Shoulder pain, left   . Skin cancer   . Tinnitus   . Wears glasses          Past Surgical History:  Procedure Laterality Date  . AMPUTATION TOE Left 09/07/2015   Procedure: PARTIAL AMPUTATION TOE 4TH LEFT;  Surgeon: [name], MD;  Location: [facility];  Service: Podiatry;  Laterality: Left;  . APPENDECTOMY  1970's  . CYSTO/  CLOT EVACUATION POST PROSTATECTOMY  11-19-2005  . CYSTO/  REMOVAL URETHRAL STONE AND URETHRAL STAPLES  06-02-2010  . CYSTO/  URETEROSCOPIC STONE EXTRACTION  X2  1990's  . KIDNEY STONE SURGERY    .  LEFT HEART CATH AND CORONARY ANGIOGRAPHY N/A 02/25/2019   Procedure: LEFT HEART CATH AND CORONARY ANGIOGRAPHY;  Surgeon: [name], MD;  Location: [facility];  Service: Cardiovascular;  Laterality: N/A;  . MELANOMA EXCISION  2015   right ear  . ROBOT ASSISTED LAPAROSCOPIC RADICAL PROSTATECTOMY  11-09-2005  . SKIN CANCER EXCISION    . TONSILLECTOMY  child    Social History: Support: lives with wife  Social History      Tobacco Use  Smoking Status Never Smoker  Smokeless Tobacco Never Used    Social History       Substance and Sexual Activity  Alcohol Use Yes  . Alcohol/week: 7.0 standard drinks  . Types: 7 Shots of liquor per week   Comment: 1 drink daily     No Known Allergies  Medications: Asprin: yes Statin: yes Beta Blocker: no Ace Inhibitor: ARB Anti-Coagulation: no        Current Outpatient Medications  Medication Sig Dispense Refill  . ascorbic acid (VITAMIN C) 1000 MG tablet Take 1,000 mg by mouth daily.    Marland Kitchen aspirin EC 81  MG tablet Take 81 mg by mouth at bedtime.     Marland Kitchen atorvastatin (LIPITOR) 10 MG tablet Take 10 mg by mouth at bedtime.     . calcium carbonate (TUMS - DOSED IN MG ELEMENTAL CALCIUM) 500 MG chewable tablet Chew 1-2 tablets by mouth 3 (three) times daily as needed for indigestion or heartburn.     . Cholecalciferol (VITAMIN D) 2000 units CAPS Take 2,000 Units by mouth 2 (two) times a day.     . hydrochlorothiazide (HYDRODIURIL) 25 MG tablet Take 25 mg by mouth daily. IN THE MORNING    . Multiple Vitamin (MULTIVITAMIN WITH MINERALS) TABS tablet Take 1 tablet by mouth daily.    . nitroGLYCERIN (NITROSTAT) 0.4 MG SL tablet Place 1 tablet (0.4 mg total) under the tongue every 5 (five) minutes as needed for chest pain. 25 tablet 6  . olmesartan (BENICAR) 40 MG tablet Take 40 mg by mouth daily.     . Probiotic Product (PROBIOTIC DAILY PO) Take 1 capsule by mouth daily.     . verapamil (VERELAN PM) 240 MG 24 hr capsule Take 240 mg by mouth at bedtime.     No current facility-administered medications for this visit.     (Not in a hospital admission)        Family History  Problem Relation Age of Onset  . Arthritis Mother   . Diabetes Mellitus II Mother   . Congestive Heart Failure Mother   . Osteoarthritis Mother        CHRONIC  . Other Father        KILLED IN WWII     Review of Systems:   Review of Systems  Constitutional: Positive for malaise/fatigue.  HENT: Negative.   Eyes: Negative.   Respiratory: Negative.   Cardiovascular: Positive for palpitations and claudication. Negative for chest pain, orthopnea, leg swelling and PND.  Gastrointestinal: Positive for heartburn. Negative for abdominal pain, diarrhea and vomiting.  Musculoskeletal: Negative.   Skin: Negative.   Neurological:       Mental fog   Physical Exam: Today's Vitals   03/12/19 0657 03/12/19 0700  BP:  140/89  Pulse:  (!) 104  Resp:  19  Temp:  (!) 97.4 F (36.3 C)  SpO2:  98%   Weight: 80.7 kg   Height: 5\' 10"  (1.778 m)   PainSc: 3  Body mass index is 25.54 kg/m.  General:  Appears stated age.  nontoxic HEENT: NCAT Neuro: Nonfocal, alert, oriented.   Psych: appropriate affect.  Cardiovascular: regular, mildy tachy, no murmur, nocarotid bruit, good peripheral pulses  Pulmonary: EWOB, clear breath sounds GI: NT/ND MSK: ambulates well. Extremities: no peripheral edema     Diagnostic Studies & Laboratory data:    Left Heart Catherization:  The left ventricular systolic function is normal.  LV end diastolic pressure is normal.  The left ventricular ejection fraction is 55-65% by visual estimate.  Prox RCA lesion is 90% stenosed.  Prox LAD to Mid LAD lesion is 60% stenosed.  Mid LAD lesion is 80% stenosed.  Mid Cx lesion is 80% stenosed.   Echo: -Left Ventricle: The left ventricle has hyperdynamic systolic function, with an ejection fraction of >65%. The cavity size was normal. There is no increase in left ventricular wall thickness. Left ventricular diastolic Doppler parameters are consistent  with impaired relaxation. - Right Ventricle: The right ventricle has normal systolic function. The cavity was normal. There is no increase in right ventricular wall thickness. - Mitral Valve: The mitral valve is grossly normal. Mitral valve regurgitation is trivial by color flow Doppler. - Tricuspid Valve: The tricuspid valve is normal in structure. Tricuspid valve regurgitation was not visualized by color flow Doppler. - Aortic Valve: The aortic valve is tricuspid Mild thickening of the aortic valve. Mild calcification of the aortic valve. Aortic valve regurgitation was not visualized by color flow Doppler. - Pulmonic Valve: The pulmonic valve was grossly normal. Pulmonic valve regurgitation is trivial by color flow Doppler.     I have independently reviewed the above radiologic studies and discussed with the patient   Recent Lab  Findings: Recent Labs       Lab Results  Component Value Date   WBC 9.7 02/19/2019   HGB 16.7 02/19/2019   HCT 48.1 02/19/2019   PLT 252 02/19/2019   GLUCOSE 91 02/19/2019   NA 140 02/19/2019   K 3.9 02/19/2019   CL 103 02/19/2019   CREATININE 1.18 02/19/2019   BUN 17 02/19/2019   CO2 22 02/19/2019   TSH 3.520 02/13/2018        Assessment / Plan:   73 yo male with progressive chest pain.  Underwent a LHC which showed significant 3V CAD.  Normal LV and RV function, and no valvular disease.  LAD, and PDA targets are good caliber.  The OM2 target is small, but the distal circ is adequate.     Carotid duplex negative  CABG 2-3 today       Makoto Sellitto O Othman Masur

## 2019-03-12 ENCOUNTER — Inpatient Hospital Stay (HOSPITAL_COMMUNITY)
Admission: RE | Disposition: A | Discharge status: Home / Self Care | Payer: Self-pay | Source: Home / Self Care | Attending: Thoracic Surgery (Cardiothoracic Vascular Surgery)

## 2019-03-12 ENCOUNTER — Inpatient Hospital Stay (HOSPITAL_COMMUNITY): Payer: Medicare HMO | Admitting: Certified Registered Nurse Anesthetist

## 2019-03-12 ENCOUNTER — Inpatient Hospital Stay (HOSPITAL_COMMUNITY): Payer: Medicare HMO | Admitting: Physician Assistant

## 2019-03-12 ENCOUNTER — Other Ambulatory Visit: Payer: Self-pay

## 2019-03-12 ENCOUNTER — Encounter (HOSPITAL_COMMUNITY): Payer: Self-pay

## 2019-03-12 ENCOUNTER — Inpatient Hospital Stay (HOSPITAL_COMMUNITY)
Admission: RE | Admit: 2019-03-12 | Discharge: 2019-03-17 | DRG: 236 | Disposition: A | Discharge status: Home / Self Care | Payer: Medicare HMO | Attending: Thoracic Surgery (Cardiothoracic Vascular Surgery) | Admitting: Thoracic Surgery (Cardiothoracic Vascular Surgery)

## 2019-03-12 ENCOUNTER — Inpatient Hospital Stay (HOSPITAL_COMMUNITY): Payer: Medicare HMO

## 2019-03-12 DIAGNOSIS — I071 Rheumatic tricuspid insufficiency: Secondary | ICD-10-CM | POA: Diagnosis not present

## 2019-03-12 DIAGNOSIS — I119 Hypertensive heart disease without heart failure: Secondary | ICD-10-CM | POA: Diagnosis not present

## 2019-03-12 DIAGNOSIS — Z833 Family history of diabetes mellitus: Secondary | ICD-10-CM

## 2019-03-12 DIAGNOSIS — J811 Chronic pulmonary edema: Secondary | ICD-10-CM | POA: Diagnosis not present

## 2019-03-12 DIAGNOSIS — I25119 Atherosclerotic heart disease of native coronary artery with unspecified angina pectoris: Secondary | ICD-10-CM

## 2019-03-12 DIAGNOSIS — Z89422 Acquired absence of other left toe(s): Secondary | ICD-10-CM

## 2019-03-12 DIAGNOSIS — Z8249 Family history of ischemic heart disease and other diseases of the circulatory system: Secondary | ICD-10-CM | POA: Diagnosis not present

## 2019-03-12 DIAGNOSIS — G4730 Sleep apnea, unspecified: Secondary | ICD-10-CM | POA: Diagnosis present

## 2019-03-12 DIAGNOSIS — Z4682 Encounter for fitting and adjustment of non-vascular catheter: Secondary | ICD-10-CM | POA: Diagnosis not present

## 2019-03-12 DIAGNOSIS — Z7982 Long term (current) use of aspirin: Secondary | ICD-10-CM | POA: Diagnosis not present

## 2019-03-12 DIAGNOSIS — E785 Hyperlipidemia, unspecified: Secondary | ICD-10-CM | POA: Diagnosis not present

## 2019-03-12 DIAGNOSIS — D62 Acute posthemorrhagic anemia: Secondary | ICD-10-CM | POA: Diagnosis not present

## 2019-03-12 DIAGNOSIS — J9811 Atelectasis: Secondary | ICD-10-CM | POA: Diagnosis not present

## 2019-03-12 DIAGNOSIS — K219 Gastro-esophageal reflux disease without esophagitis: Secondary | ICD-10-CM | POA: Diagnosis present

## 2019-03-12 DIAGNOSIS — Z85820 Personal history of malignant melanoma of skin: Secondary | ICD-10-CM

## 2019-03-12 DIAGNOSIS — J449 Chronic obstructive pulmonary disease, unspecified: Secondary | ICD-10-CM | POA: Diagnosis not present

## 2019-03-12 DIAGNOSIS — J90 Pleural effusion, not elsewhere classified: Secondary | ICD-10-CM | POA: Diagnosis not present

## 2019-03-12 DIAGNOSIS — I2510 Atherosclerotic heart disease of native coronary artery without angina pectoris: Principal | ICD-10-CM | POA: Diagnosis present

## 2019-03-12 DIAGNOSIS — Z8546 Personal history of malignant neoplasm of prostate: Secondary | ICD-10-CM

## 2019-03-12 DIAGNOSIS — D696 Thrombocytopenia, unspecified: Secondary | ICD-10-CM | POA: Diagnosis not present

## 2019-03-12 DIAGNOSIS — Z9079 Acquired absence of other genital organ(s): Secondary | ICD-10-CM

## 2019-03-12 DIAGNOSIS — E8770 Fluid overload, unspecified: Secondary | ICD-10-CM | POA: Diagnosis not present

## 2019-03-12 DIAGNOSIS — Z79899 Other long term (current) drug therapy: Secondary | ICD-10-CM

## 2019-03-12 DIAGNOSIS — Z951 Presence of aortocoronary bypass graft: Secondary | ICD-10-CM | POA: Diagnosis not present

## 2019-03-12 DIAGNOSIS — I10 Essential (primary) hypertension: Secondary | ICD-10-CM | POA: Diagnosis not present

## 2019-03-12 HISTORY — PX: CORONARY ARTERY BYPASS GRAFT: SHX141

## 2019-03-12 HISTORY — PX: TEE WITHOUT CARDIOVERSION: SHX5443

## 2019-03-12 LAB — POCT I-STAT 4, (NA,K, GLUC, HGB,HCT)
Glucose, Bld: 96 mg/dL (ref 70–99)
Glucose, Bld: 111 mg/dL — ABNORMAL HIGH (ref 70–99)
Glucose, Bld: 106 mg/dL — ABNORMAL HIGH (ref 70–99)
Glucose, Bld: 134 mg/dL — ABNORMAL HIGH (ref 70–99)
Glucose, Bld: 123 mg/dL — ABNORMAL HIGH (ref 70–99)
Glucose, Bld: 108 mg/dL — ABNORMAL HIGH (ref 70–99)
HCT: 37 % — ABNORMAL LOW (ref 39.0–52.0)
HCT: 34 % — ABNORMAL LOW (ref 39.0–52.0)
HCT: 27 % — ABNORMAL LOW (ref 39.0–52.0)
HCT: 25 % — ABNORMAL LOW (ref 39.0–52.0)
HCT: 26 % — ABNORMAL LOW (ref 39.0–52.0)
HCT: 32 % — ABNORMAL LOW (ref 39.0–52.0)
Hemoglobin: 12.6 g/dL — ABNORMAL LOW (ref 13.0–17.0)
Hemoglobin: 11.6 g/dL — ABNORMAL LOW (ref 13.0–17.0)
Hemoglobin: 9.2 g/dL — ABNORMAL LOW (ref 13.0–17.0)
Hemoglobin: 8.5 g/dL — ABNORMAL LOW (ref 13.0–17.0)
Hemoglobin: 8.8 g/dL — ABNORMAL LOW (ref 13.0–17.0)
Hemoglobin: 10.9 g/dL — ABNORMAL LOW (ref 13.0–17.0)
Potassium: 3.6 mmol/L (ref 3.5–5.1)
Potassium: 3.8 mmol/L (ref 3.5–5.1)
Potassium: 3.9 mmol/L (ref 3.5–5.1)
Potassium: 4.8 mmol/L (ref 3.5–5.1)
Potassium: 4.4 mmol/L (ref 3.5–5.1)
Potassium: 4.4 mmol/L (ref 3.5–5.1)
Sodium: 139 mmol/L (ref 135–145)
Sodium: 138 mmol/L (ref 135–145)
Sodium: 141 mmol/L (ref 135–145)
Sodium: 135 mmol/L (ref 135–145)
Sodium: 138 mmol/L (ref 135–145)
Sodium: 138 mmol/L (ref 135–145)

## 2019-03-12 LAB — POCT I-STAT 7, (LYTES, BLD GAS, ICA,H+H)
Acid-base deficit: 1 mmol/L (ref 0.0–2.0)
Acid-base deficit: 3 mmol/L — ABNORMAL HIGH (ref 0.0–2.0)
Acid-base deficit: 5 mmol/L — ABNORMAL HIGH (ref 0.0–2.0)
Acid-base deficit: 5 mmol/L — ABNORMAL HIGH (ref 0.0–2.0)
Acid-base deficit: 5 mmol/L — ABNORMAL HIGH (ref 0.0–2.0)
Bicarbonate: 23.8 mmol/L (ref 20.0–28.0)
Bicarbonate: 22.9 mmol/L (ref 20.0–28.0)
Bicarbonate: 20.7 mmol/L (ref 20.0–28.0)
Bicarbonate: 19.3 mmol/L — ABNORMAL LOW (ref 20.0–28.0)
Bicarbonate: 19.6 mmol/L — ABNORMAL LOW (ref 20.0–28.0)
Calcium, Ion: 1.01 mmol/L — ABNORMAL LOW (ref 1.15–1.40)
Calcium, Ion: 1.35 mmol/L (ref 1.15–1.40)
Calcium, Ion: 1.25 mmol/L (ref 1.15–1.40)
Calcium, Ion: 1.17 mmol/L (ref 1.15–1.40)
Calcium, Ion: 1.19 mmol/L (ref 1.15–1.40)
HCT: 26 % — ABNORMAL LOW (ref 39.0–52.0)
HCT: 26 % — ABNORMAL LOW (ref 39.0–52.0)
HCT: 32 % — ABNORMAL LOW (ref 39.0–52.0)
HCT: 31 % — ABNORMAL LOW (ref 39.0–52.0)
HCT: 31 % — ABNORMAL LOW (ref 39.0–52.0)
Hemoglobin: 8.8 g/dL — ABNORMAL LOW (ref 13.0–17.0)
Hemoglobin: 8.8 g/dL — ABNORMAL LOW (ref 13.0–17.0)
Hemoglobin: 10.9 g/dL — ABNORMAL LOW (ref 13.0–17.0)
Hemoglobin: 10.5 g/dL — ABNORMAL LOW (ref 13.0–17.0)
Hemoglobin: 10.5 g/dL — ABNORMAL LOW (ref 13.0–17.0)
O2 Saturation: 100 %
O2 Saturation: 100 %
O2 Saturation: 97 %
O2 Saturation: 99 %
O2 Saturation: 97 %
Patient temperature: 35.2
Patient temperature: 36.5
Patient temperature: 37
Potassium: 4 mmol/L (ref 3.5–5.1)
Potassium: 4.5 mmol/L (ref 3.5–5.1)
Potassium: 4.3 mmol/L (ref 3.5–5.1)
Potassium: 4.4 mmol/L (ref 3.5–5.1)
Potassium: 4.6 mmol/L (ref 3.5–5.1)
Sodium: 141 mmol/L (ref 135–145)
Sodium: 138 mmol/L (ref 135–145)
Sodium: 140 mmol/L (ref 135–145)
Sodium: 139 mmol/L (ref 135–145)
Sodium: 140 mmol/L (ref 135–145)
TCO2: 25 mmol/L (ref 22–32)
TCO2: 24 mmol/L (ref 22–32)
TCO2: 22 mmol/L (ref 22–32)
TCO2: 20 mmol/L — ABNORMAL LOW (ref 22–32)
TCO2: 21 mmol/L — ABNORMAL LOW (ref 22–32)
pCO2 arterial: 39.7 mmHg (ref 32.0–48.0)
pCO2 arterial: 42.2 mmHg (ref 32.0–48.0)
pCO2 arterial: 34.8 mmHg (ref 32.0–48.0)
pCO2 arterial: 30.9 mmHg — ABNORMAL LOW (ref 32.0–48.0)
pCO2 arterial: 34.7 mmHg (ref 32.0–48.0)
pH, Arterial: 7.387 (ref 7.350–7.450)
pH, Arterial: 7.343 — ABNORMAL LOW (ref 7.350–7.450)
pH, Arterial: 7.373 (ref 7.350–7.450)
pH, Arterial: 7.403 (ref 7.350–7.450)
pH, Arterial: 7.36 (ref 7.350–7.450)
pO2, Arterial: 385 mmHg — ABNORMAL HIGH (ref 83.0–108.0)
pO2, Arterial: 295 mmHg — ABNORMAL HIGH (ref 83.0–108.0)
pO2, Arterial: 81 mmHg — ABNORMAL LOW (ref 83.0–108.0)
pO2, Arterial: 116 mmHg — ABNORMAL HIGH (ref 83.0–108.0)
pO2, Arterial: 99 mmHg (ref 83.0–108.0)

## 2019-03-12 LAB — HEMOGLOBIN AND HEMATOCRIT, BLOOD
HCT: 27 % — ABNORMAL LOW (ref 39.0–52.0)
Hemoglobin: 9.6 g/dL — ABNORMAL LOW (ref 13.0–17.0)

## 2019-03-12 LAB — BASIC METABOLIC PANEL
Anion gap: 9 (ref 5–15)
BUN: 16 mg/dL (ref 8–23)
CO2: 18 mmol/L — ABNORMAL LOW (ref 22–32)
Calcium: 8 mg/dL — ABNORMAL LOW (ref 8.9–10.3)
Chloride: 110 mmol/L (ref 98–111)
Creatinine, Ser: 1.08 mg/dL (ref 0.61–1.24)
GFR calc Af Amer: >60 mL/min (ref >60)
GFR calc non Af Amer: >60 mL/min (ref >60)
Glucose, Bld: 153 mg/dL — ABNORMAL HIGH (ref 70–99)
Potassium: 4.4 mmol/L (ref 3.5–5.1)
Sodium: 137 mmol/L (ref 135–145)

## 2019-03-12 LAB — CBC
HCT: 34.6 % — ABNORMAL LOW (ref 39.0–52.0)
HCT: 32.7 % — ABNORMAL LOW (ref 39.0–52.0)
Hemoglobin: 12 g/dL — ABNORMAL LOW (ref 13.0–17.0)
Hemoglobin: 11.6 g/dL — ABNORMAL LOW (ref 13.0–17.0)
MCH: 31.2 pg (ref 26.0–34.0)
MCH: 31.6 pg (ref 26.0–34.0)
MCHC: 34.7 g/dL (ref 30.0–36.0)
MCHC: 35.5 g/dL (ref 30.0–36.0)
MCV: 89.9 fL (ref 80.0–100.0)
MCV: 89.1 fL (ref 80.0–100.0)
Platelets: 120 10*3/uL — ABNORMAL LOW (ref 150–400)
Platelets: 125 10*3/uL — ABNORMAL LOW (ref 150–400)
RBC: 3.85 MIL/uL — ABNORMAL LOW (ref 4.22–5.81)
RBC: 3.67 MIL/uL — ABNORMAL LOW (ref 4.22–5.81)
RDW: 11.8 % (ref 11.5–15.5)
RDW: 11.8 % (ref 11.5–15.5)
WBC: 16.7 10*3/uL — ABNORMAL HIGH (ref 4.0–10.5)
WBC: 17.6 10*3/uL — ABNORMAL HIGH (ref 4.0–10.5)
nRBC: 0 % (ref 0.0–0.2)
nRBC: 0 % (ref 0.0–0.2)

## 2019-03-12 LAB — POCT I-STAT, CHEM 8
BUN: 16 mg/dL (ref 8–23)
Calcium, Ion: 1.18 mmol/L (ref 1.15–1.40)
Chloride: 108 mmol/L (ref 98–111)
Creatinine, Ser: 0.9 mg/dL (ref 0.61–1.24)
Glucose, Bld: 148 mg/dL — ABNORMAL HIGH (ref 70–99)
HCT: 30 % — ABNORMAL LOW (ref 39.0–52.0)
Hemoglobin: 10.2 g/dL — ABNORMAL LOW (ref 13.0–17.0)
Potassium: 4.5 mmol/L (ref 3.5–5.1)
Sodium: 139 mmol/L (ref 135–145)
TCO2: 19 mmol/L — ABNORMAL LOW (ref 22–32)

## 2019-03-12 LAB — PROTIME-INR
INR: 1.5 — ABNORMAL HIGH (ref 0.8–1.2)
Prothrombin Time: 17.7 seconds — ABNORMAL HIGH (ref 11.4–15.2)

## 2019-03-12 LAB — PLATELET COUNT: Platelets: 131 10*3/uL — ABNORMAL LOW (ref 150–400)

## 2019-03-12 LAB — APTT: aPTT: 28 seconds (ref 24–36)

## 2019-03-12 LAB — MAGNESIUM: Magnesium: 2.9 mg/dL — ABNORMAL HIGH (ref 1.7–2.4)

## 2019-03-12 SURGERY — CORONARY ARTERY BYPASS GRAFTING (CABG)
Anesthesia: General | Site: Chest

## 2019-03-12 MED ORDER — PROTAMINE SULFATE 10 MG/ML IV SOLN
INTRAVENOUS | Status: AC
  Filled 2019-03-12: qty 5

## 2019-03-12 MED ORDER — SODIUM CHLORIDE 0.45 % IV SOLN
INTRAVENOUS | Status: DC | PRN
  Administered 2019-03-12: 14:00:00 via INTRAVENOUS

## 2019-03-12 MED ORDER — SODIUM CHLORIDE 0.9 % IV SOLN
1.5000 g | Freq: Two times a day (BID) | INTRAVENOUS | Status: AC
  Administered 2019-03-12 – 2019-03-14 (×4): 1.5 g via INTRAVENOUS
  Filled 2019-03-12 (×4): qty 1.5

## 2019-03-12 MED ORDER — TRAMADOL HCL 50 MG PO TABS
50.0000 mg | ORAL_TABLET | ORAL | Status: DC | PRN
  Administered 2019-03-13 (×2): 50 mg via ORAL
  Filled 2019-03-12 (×2): qty 1

## 2019-03-12 MED ORDER — ONDANSETRON HCL 4 MG/2ML IJ SOLN
4.0000 mg | Freq: Four times a day (QID) | INTRAMUSCULAR | Status: DC | PRN
  Administered 2019-03-13: 4 mg via INTRAVENOUS
  Filled 2019-03-12: qty 2

## 2019-03-12 MED ORDER — PHENYLEPHRINE 40 MCG/ML (10ML) SYRINGE FOR IV PUSH (FOR BLOOD PRESSURE SUPPORT)
PREFILLED_SYRINGE | INTRAVENOUS | Status: AC
  Filled 2019-03-12: qty 10

## 2019-03-12 MED ORDER — MAGNESIUM SULFATE 4 GM/100ML IV SOLN
4.0000 g | Freq: Once | INTRAVENOUS | Status: AC
  Administered 2019-03-12: 4 g via INTRAVENOUS
  Filled 2019-03-12: qty 100

## 2019-03-12 MED ORDER — ASPIRIN 81 MG PO CHEW: 324.0000 mg | CHEWABLE_TABLET | Freq: Every day | ORAL | Status: DC

## 2019-03-12 MED ORDER — ASPIRIN EC 325 MG PO TBEC
325.0000 mg | DELAYED_RELEASE_TABLET | Freq: Every day | ORAL | Status: DC
  Administered 2019-03-13 – 2019-03-17 (×5): 325 mg via ORAL
  Filled 2019-03-12 (×5): qty 1

## 2019-03-12 MED ORDER — MIDAZOLAM HCL (PF) 10 MG/2ML IJ SOLN
INTRAMUSCULAR | Status: AC
  Filled 2019-03-12: qty 2

## 2019-03-12 MED ORDER — 0.9 % SODIUM CHLORIDE (POUR BTL) OPTIME
TOPICAL | Status: DC | PRN
  Administered 2019-03-12 (×2): 1000 mL

## 2019-03-12 MED ORDER — HEMOSTATIC AGENTS (NO CHARGE) OPTIME
TOPICAL | Status: DC | PRN
  Administered 2019-03-12: 1 via TOPICAL

## 2019-03-12 MED ORDER — POTASSIUM CHLORIDE 10 MEQ/50ML IV SOLN: 10.0000 meq | INTRAVENOUS | Status: AC

## 2019-03-12 MED ORDER — SODIUM CHLORIDE 0.9% FLUSH
10.0000 mL | Freq: Two times a day (BID) | INTRAVENOUS | Status: DC
  Administered 2019-03-12 – 2019-03-14 (×4): 10 mL

## 2019-03-12 MED ORDER — INSULIN ASPART 100 UNIT/ML ~~LOC~~ SOLN
0.0000 [IU] | SUBCUTANEOUS | Status: DC
  Administered 2019-03-12 – 2019-03-13 (×5): 2 [IU] via SUBCUTANEOUS

## 2019-03-12 MED ORDER — ACETAMINOPHEN 500 MG PO TABS
1000.0000 mg | ORAL_TABLET | Freq: Four times a day (QID) | ORAL | Status: DC
  Administered 2019-03-12 – 2019-03-17 (×16): 1000 mg via ORAL
  Filled 2019-03-12 (×14): qty 2

## 2019-03-12 MED ORDER — HEPARIN SODIUM (PORCINE) 1000 UNIT/ML IJ SOLN
INTRAMUSCULAR | Status: DC | PRN
  Administered 2019-03-12: 29000 [IU] via INTRAVENOUS

## 2019-03-12 MED ORDER — CHLORHEXIDINE GLUCONATE CLOTH 2 % EX PADS
6.0000 | MEDICATED_PAD | Freq: Every day | CUTANEOUS | Status: DC
  Administered 2019-03-12: 6 via TOPICAL

## 2019-03-12 MED ORDER — LACTATED RINGERS IV SOLN: 500.0000 mL | Freq: Once | INTRAVENOUS | Status: DC | PRN

## 2019-03-12 MED ORDER — SODIUM CHLORIDE (PF) 0.9 % IJ SOLN
OROMUCOSAL | Status: DC | PRN
  Administered 2019-03-12 (×3): 4 mL via TOPICAL

## 2019-03-12 MED ORDER — FENTANYL CITRATE (PF) 250 MCG/5ML IJ SOLN
INTRAMUSCULAR | Status: AC
  Filled 2019-03-12: qty 20

## 2019-03-12 MED ORDER — 0.9 % SODIUM CHLORIDE (POUR BTL) OPTIME
TOPICAL | Status: DC | PRN
  Administered 2019-03-12: 1000 mL

## 2019-03-12 MED ORDER — BISACODYL 5 MG PO TBEC
10.0000 mg | DELAYED_RELEASE_TABLET | Freq: Every day | ORAL | Status: DC
  Administered 2019-03-13 – 2019-03-15 (×3): 10 mg via ORAL
  Filled 2019-03-12 (×4): qty 2

## 2019-03-12 MED ORDER — ORAL CARE MOUTH RINSE
15.0000 mL | OROMUCOSAL | Status: DC
  Administered 2019-03-12 – 2019-03-13 (×3): 15 mL via OROMUCOSAL

## 2019-03-12 MED ORDER — SODIUM CHLORIDE 0.9% FLUSH: 10.0000 mL | INTRAVENOUS | Status: DC | PRN

## 2019-03-12 MED ORDER — LACTATED RINGERS IV SOLN
INTRAVENOUS | Status: DC
  Administered 2019-03-12: 14:00:00 via INTRAVENOUS

## 2019-03-12 MED ORDER — PROTAMINE SULFATE 10 MG/ML IV SOLN
INTRAVENOUS | Status: DC | PRN
  Administered 2019-03-12: 290 mg via INTRAVENOUS

## 2019-03-12 MED ORDER — ACETAMINOPHEN 160 MG/5ML PO SOLN: 1000.0000 mg | Freq: Four times a day (QID) | ORAL | Status: DC

## 2019-03-12 MED ORDER — SODIUM CHLORIDE 0.9 % IV SOLN
250.0000 mL | INTRAVENOUS | Status: DC
  Administered 2019-03-12: 250 mL via INTRAVENOUS

## 2019-03-12 MED ORDER — METOPROLOL TARTRATE 12.5 MG HALF TABLET
12.5000 mg | ORAL_TABLET | Freq: Once | ORAL | Status: AC
  Administered 2019-03-12: 12.5 mg via ORAL
  Filled 2019-03-12: qty 1

## 2019-03-12 MED ORDER — LACTATED RINGERS IV SOLN
INTRAVENOUS | Status: DC | PRN
  Administered 2019-03-12: 08:00:00 via INTRAVENOUS

## 2019-03-12 MED ORDER — ALBUMIN HUMAN 5 % IV SOLN
250.0000 mL | INTRAVENOUS | Status: AC | PRN
  Administered 2019-03-12 (×3): 12.5 g via INTRAVENOUS
  Filled 2019-03-12: qty 250

## 2019-03-12 MED ORDER — CHLORHEXIDINE GLUCONATE 0.12% ORAL RINSE (MEDLINE KIT)
15.0000 mL | Freq: Two times a day (BID) | OROMUCOSAL | Status: DC
  Administered 2019-03-12: 15 mL via OROMUCOSAL

## 2019-03-12 MED ORDER — SODIUM CHLORIDE 0.9 % IV SOLN
INTRAVENOUS | Status: DC | PRN
  Administered 2019-03-12: 08:00:00 via INTRAVENOUS

## 2019-03-12 MED ORDER — DOCUSATE SODIUM 100 MG PO CAPS
200.0000 mg | ORAL_CAPSULE | Freq: Every day | ORAL | Status: DC
  Administered 2019-03-13 – 2019-03-15 (×3): 200 mg via ORAL
  Filled 2019-03-12 (×4): qty 2

## 2019-03-12 MED ORDER — METOPROLOL TARTRATE 25 MG/10 ML ORAL SUSPENSION
12.5000 mg | Freq: Two times a day (BID) | ORAL | Status: DC
  Administered 2019-03-12: 12.5 mg

## 2019-03-12 MED ORDER — PROPOFOL 10 MG/ML IV BOLUS
INTRAVENOUS | Status: AC
  Filled 2019-03-12: qty 20

## 2019-03-12 MED ORDER — ATORVASTATIN CALCIUM 10 MG PO TABS
10.0000 mg | ORAL_TABLET | Freq: Every day | ORAL | Status: DC
  Administered 2019-03-12 – 2019-03-16 (×5): 10 mg via ORAL
  Filled 2019-03-12 (×5): qty 1

## 2019-03-12 MED ORDER — INSULIN REGULAR(HUMAN) IN NACL 100-0.9 UT/100ML-% IV SOLN: INTRAVENOUS | Status: DC

## 2019-03-12 MED ORDER — FENTANYL CITRATE (PF) 250 MCG/5ML IJ SOLN
INTRAMUSCULAR | Status: AC
  Filled 2019-03-12: qty 5

## 2019-03-12 MED ORDER — BISACODYL 10 MG RE SUPP: 10.0000 mg | Freq: Every day | RECTAL | Status: DC

## 2019-03-12 MED ORDER — VANCOMYCIN HCL IN DEXTROSE 1-5 GM/200ML-% IV SOLN
1000.0000 mg | Freq: Once | INTRAVENOUS | Status: AC
  Administered 2019-03-12: 1000 mg via INTRAVENOUS
  Filled 2019-03-12: qty 200

## 2019-03-12 MED ORDER — CHLORHEXIDINE GLUCONATE 0.12 % MT SOLN
15.0000 mL | Freq: Once | OROMUCOSAL | Status: AC
  Administered 2019-03-12: 15 mL via OROMUCOSAL
  Filled 2019-03-12: qty 15

## 2019-03-12 MED ORDER — FENTANYL CITRATE (PF) 250 MCG/5ML IJ SOLN
INTRAMUSCULAR | Status: DC | PRN
  Administered 2019-03-12: 950 ug via INTRAVENOUS
  Administered 2019-03-12: 100 ug via INTRAVENOUS
  Administered 2019-03-12: 50 ug via INTRAVENOUS

## 2019-03-12 MED ORDER — PROPOFOL 10 MG/ML IV BOLUS
INTRAVENOUS | Status: DC | PRN
  Administered 2019-03-12: 50 mg via INTRAVENOUS

## 2019-03-12 MED ORDER — NICARDIPINE HCL IN NACL 20-0.86 MG/200ML-% IV SOLN
0.0000 mg/h | INTRAVENOUS | Status: DC
  Administered 2019-03-12: 5 mg/h via INTRAVENOUS
  Filled 2019-03-12 (×2): qty 200

## 2019-03-12 MED ORDER — SODIUM CHLORIDE 0.9 % IV SOLN
INTRAVENOUS | Status: DC
  Administered 2019-03-12: 14:00:00 via INTRAVENOUS

## 2019-03-12 MED ORDER — ROCURONIUM BROMIDE 10 MG/ML (PF) SYRINGE
PREFILLED_SYRINGE | INTRAVENOUS | Status: DC | PRN
  Administered 2019-03-12: 20 mg via INTRAVENOUS
  Administered 2019-03-12: 50 mg via INTRAVENOUS
  Administered 2019-03-12: 100 mg via INTRAVENOUS

## 2019-03-12 MED ORDER — SODIUM CHLORIDE 0.9% FLUSH: 3.0000 mL | INTRAVENOUS | Status: DC | PRN

## 2019-03-12 MED ORDER — LACTATED RINGERS IV SOLN: INTRAVENOUS | Status: DC

## 2019-03-12 MED ORDER — ONDANSETRON HCL 4 MG/2ML IJ SOLN
INTRAMUSCULAR | Status: AC
  Filled 2019-03-12: qty 2

## 2019-03-12 MED ORDER — FAMOTIDINE IN NACL 20-0.9 MG/50ML-% IV SOLN
20.0000 mg | Freq: Two times a day (BID) | INTRAVENOUS | Status: AC
  Administered 2019-03-12: 20 mg via INTRAVENOUS

## 2019-03-12 MED ORDER — INSULIN REGULAR BOLUS VIA INFUSION
0.0000 [IU] | Freq: Three times a day (TID) | INTRAVENOUS | Status: DC
  Filled 2019-03-12: qty 10

## 2019-03-12 MED ORDER — PROBIOTIC DAILY PO CAPS: ORAL_CAPSULE | Freq: Every day | ORAL | Status: DC

## 2019-03-12 MED ORDER — HEPARIN SODIUM (PORCINE) 1000 UNIT/ML IJ SOLN
INTRAMUSCULAR | Status: AC
  Filled 2019-03-12: qty 1

## 2019-03-12 MED ORDER — DEXMEDETOMIDINE HCL IN NACL 200 MCG/50ML IV SOLN: 0.0000 ug/kg/h | INTRAVENOUS | Status: DC

## 2019-03-12 MED ORDER — METOPROLOL TARTRATE 5 MG/5ML IV SOLN: 2.5000 mg | INTRAVENOUS | Status: DC | PRN

## 2019-03-12 MED ORDER — ACETAMINOPHEN 160 MG/5ML PO SOLN
650.0000 mg | Freq: Once | ORAL | Status: AC
  Administered 2019-03-12: 650 mg

## 2019-03-12 MED ORDER — CHLORHEXIDINE GLUCONATE 0.12 % MT SOLN
15.0000 mL | OROMUCOSAL | Status: AC
  Administered 2019-03-12: 15 mL via OROMUCOSAL

## 2019-03-12 MED ORDER — ADULT MULTIVITAMIN W/MINERALS CH
1.0000 | ORAL_TABLET | Freq: Every day | ORAL | Status: DC
  Administered 2019-03-13 – 2019-03-17 (×5): 1 via ORAL
  Filled 2019-03-12 (×5): qty 1

## 2019-03-12 MED ORDER — OXYCODONE HCL 5 MG PO TABS
5.0000 mg | ORAL_TABLET | ORAL | Status: DC | PRN
  Administered 2019-03-12 – 2019-03-14 (×6): 10 mg via ORAL
  Filled 2019-03-12 (×7): qty 2

## 2019-03-12 MED ORDER — CHLORHEXIDINE GLUCONATE 4 % EX LIQD: 30.0000 mL | CUTANEOUS | Status: DC

## 2019-03-12 MED ORDER — MIDAZOLAM HCL 5 MG/5ML IJ SOLN
INTRAMUSCULAR | Status: DC | PRN
  Administered 2019-03-12: 1 mg via INTRAVENOUS
  Administered 2019-03-12 (×2): 2 mg via INTRAVENOUS
  Administered 2019-03-12: 1 mg via INTRAVENOUS
  Administered 2019-03-12: 2 mg via INTRAVENOUS

## 2019-03-12 MED ORDER — PHENYLEPHRINE HCL (PRESSORS) 10 MG/ML IV SOLN
INTRAVENOUS | Status: DC | PRN
  Administered 2019-03-12: 40 ug via INTRAVENOUS

## 2019-03-12 MED ORDER — MORPHINE SULFATE (PF) 2 MG/ML IV SOLN
1.0000 mg | INTRAVENOUS | Status: DC | PRN
  Administered 2019-03-12: 1 mg via INTRAVENOUS
  Administered 2019-03-12 – 2019-03-13 (×5): 2 mg via INTRAVENOUS
  Filled 2019-03-12 (×6): qty 1

## 2019-03-12 MED ORDER — NOREPINEPHRINE 4 MG/250ML-% IV SOLN: 0.0000 ug/min | INTRAVENOUS | Status: DC

## 2019-03-12 MED ORDER — METOPROLOL TARTRATE 12.5 MG HALF TABLET
12.5000 mg | ORAL_TABLET | Freq: Two times a day (BID) | ORAL | Status: DC
  Administered 2019-03-13 – 2019-03-16 (×8): 12.5 mg via ORAL
  Filled 2019-03-12 (×9): qty 1

## 2019-03-12 MED ORDER — MIDAZOLAM HCL 2 MG/2ML IJ SOLN: 2.0000 mg | INTRAMUSCULAR | Status: DC | PRN

## 2019-03-12 MED ORDER — SODIUM CHLORIDE 0.9% FLUSH
3.0000 mL | Freq: Two times a day (BID) | INTRAVENOUS | Status: DC
  Administered 2019-03-13 – 2019-03-14 (×3): 3 mL via INTRAVENOUS

## 2019-03-12 MED ORDER — PLASMA-LYTE 148 IV SOLN
INTRAVENOUS | Status: DC | PRN
  Administered 2019-03-12: 500 mL via INTRAVASCULAR

## 2019-03-12 MED ORDER — ACETAMINOPHEN 650 MG RE SUPP: 650.0000 mg | Freq: Once | RECTAL | Status: AC

## 2019-03-12 MED ORDER — PANTOPRAZOLE SODIUM 40 MG PO TBEC
40.0000 mg | DELAYED_RELEASE_TABLET | Freq: Every day | ORAL | Status: DC
  Administered 2019-03-14 – 2019-03-17 (×4): 40 mg via ORAL
  Filled 2019-03-12 (×4): qty 1

## 2019-03-12 MED ORDER — PROTAMINE SULFATE 10 MG/ML IV SOLN
INTRAVENOUS | Status: AC
  Filled 2019-03-12: qty 25

## 2019-03-12 MED ORDER — ROCURONIUM BROMIDE 10 MG/ML (PF) SYRINGE
PREFILLED_SYRINGE | INTRAVENOUS | Status: AC
  Filled 2019-03-12: qty 20

## 2019-03-12 SURGICAL SUPPLY — 92 items
ADH SKN CLS APL DERMABOND .7 (GAUZE/BANDAGES/DRESSINGS) ×2
BAG DECANTER FOR FLEXI CONT (MISCELLANEOUS) ×5 IMPLANT
BASKET HEART (ORDER IN 25'S) (MISCELLANEOUS) ×1
BASKET HEART (ORDER IN 25S) (MISCELLANEOUS) ×2 IMPLANT
BLADE CLIPPER SURG (BLADE) ×3 IMPLANT
BLADE STERNUM SYSTEM 6 (BLADE) ×3 IMPLANT
BLADE SURG 11 STRL SS (BLADE) ×1 IMPLANT
BNDG ELASTIC 4X5.8 VLCR STR LF (GAUZE/BANDAGES/DRESSINGS) ×3 IMPLANT
BNDG ELASTIC 6X5.8 VLCR STR LF (GAUZE/BANDAGES/DRESSINGS) ×3 IMPLANT
BNDG GAUZE ELAST 4 BULKY (GAUZE/BANDAGES/DRESSINGS) ×3 IMPLANT
CANISTER SUCT 3000ML PPV (MISCELLANEOUS) ×3 IMPLANT
CANNULA EZ GLIDE AORTIC 21FR (CANNULA) ×5 IMPLANT
CANNULA MC2 2 STG 29/37 NON-V (CANNULA) IMPLANT
CANNULA MC2 TWO STAGE (CANNULA) ×1
CATH CPB KIT HENDRICKSON (MISCELLANEOUS) ×3 IMPLANT
CLIP RETRACTION 3.0MM CORONARY (MISCELLANEOUS) ×3 IMPLANT
CLIP VESOCCLUDE MED 24/CT (CLIP) IMPLANT
CLIP VESOCCLUDE SM WIDE 24/CT (CLIP) ×1 IMPLANT
CONNECTOR BLAKE 2:1 CARIO BLK (MISCELLANEOUS) ×1 IMPLANT
DERMABOND ADVANCED (GAUZE/BANDAGES/DRESSINGS) ×1
DERMABOND ADVANCED .7 DNX12 (GAUZE/BANDAGES/DRESSINGS) IMPLANT
DRAIN CHANNEL 19F RND (DRAIN) ×2 IMPLANT
DRAIN CONNECTOR BLAKE 1:1 (MISCELLANEOUS) ×2 IMPLANT
DRAPE CARDIOVASCULAR INCISE (DRAPES) ×3
DRAPE INCISE IOBAN 66X45 STRL (DRAPES) ×2 IMPLANT
DRAPE SLUSH/WARMER DISC (DRAPES) ×3 IMPLANT
DRAPE SRG 135X102X78XABS (DRAPES) ×2 IMPLANT
DRSG AQUACEL AG ADV 3.5X14 (GAUZE/BANDAGES/DRESSINGS) ×1 IMPLANT
DRSG COVADERM 4X14 (GAUZE/BANDAGES/DRESSINGS) ×2 IMPLANT
ELECT REM PT RETURN 9FT ADLT (ELECTROSURGICAL) ×6
ELECTRODE REM PT RTRN 9FT ADLT (ELECTROSURGICAL) ×4 IMPLANT
EVACUATOR SILICONE 100CC (DRAIN) ×2 IMPLANT
FELT TEFLON 1X6 (MISCELLANEOUS) ×5 IMPLANT
GAUZE SPONGE 4X4 12PLY STRL (GAUZE/BANDAGES/DRESSINGS) ×6 IMPLANT
GAUZE SPONGE 4X4 16PLY XRAY LF (GAUZE/BANDAGES/DRESSINGS) ×2 IMPLANT
GLOVE BIO SURGEON STRL SZ 6.5 (GLOVE) ×3 IMPLANT
GLOVE BIO SURGEON STRL SZ7 (GLOVE) ×7 IMPLANT
GLOVE BIOGEL PI IND STRL 6.5 (GLOVE) IMPLANT
GLOVE BIOGEL PI INDICATOR 6.5 (GLOVE) ×1
GOWN STRL REUS W/ TWL LRG LVL3 (GOWN DISPOSABLE) ×8 IMPLANT
GOWN STRL REUS W/ TWL XL LVL3 (GOWN DISPOSABLE) ×4 IMPLANT
GOWN STRL REUS W/TWL LRG LVL3 (GOWN DISPOSABLE) ×12
GOWN STRL REUS W/TWL XL LVL3 (GOWN DISPOSABLE) ×15
HEMOSTAT POWDER SURGIFOAM 1G (HEMOSTASIS) ×9 IMPLANT
KIT BASIN OR (CUSTOM PROCEDURE TRAY) ×3 IMPLANT
KIT SUCTION CATH 14FR (SUCTIONS) ×7 IMPLANT
KIT TURNOVER KIT B (KITS) ×3 IMPLANT
KIT VASOVIEW HEMOPRO 2 VH 4000 (KITS) ×3 IMPLANT
LEAD PACING MYOCARDI (MISCELLANEOUS) ×2 IMPLANT
MARKER GRAFT CORONARY BYPASS (MISCELLANEOUS) ×8 IMPLANT
NS IRRIG 1000ML POUR BTL (IV SOLUTION) ×15 IMPLANT
PACK E OPEN HEART (SUTURE) ×3 IMPLANT
PACK OPEN HEART (CUSTOM PROCEDURE TRAY) ×3 IMPLANT
PAD ELECT DEFIB RADIOL ZOLL (MISCELLANEOUS) ×3 IMPLANT
PENCIL BUTTON HOLSTER BLD 10FT (ELECTRODE) ×3 IMPLANT
POSITIONER HEAD DONUT 9IN (MISCELLANEOUS) ×3 IMPLANT
PUNCH AORTIC ROTATE 4.0MM (MISCELLANEOUS) ×1 IMPLANT
PUNCH AORTIC ROTATE 4.5MM 8IN (MISCELLANEOUS) IMPLANT
PUNCH AORTIC ROTATE 5MM 8IN (MISCELLANEOUS) IMPLANT
SET CARDIOPLEGIA MPS 5001102 (MISCELLANEOUS) ×1 IMPLANT
SPONGE LAP 18X18 RF (DISPOSABLE) IMPLANT
SPONGE LAP 4X18 RFD (DISPOSABLE) IMPLANT
SUT BONE WAX W31G (SUTURE) ×3 IMPLANT
SUT ETHIBOND X763 2 0 SH 1 (SUTURE) ×3 IMPLANT
SUT MNCRL AB 3-0 PS2 18 (SUTURE) ×6 IMPLANT
SUT MNCRL AB 4-0 PS2 18 (SUTURE) ×1 IMPLANT
SUT PDS AB 1 CTX 36 (SUTURE) ×6 IMPLANT
SUT PROLENE 2 0 SH DA (SUTURE) IMPLANT
SUT PROLENE 3 0 SH DA (SUTURE) ×2 IMPLANT
SUT PROLENE 3 0 SH1 36 (SUTURE) ×1 IMPLANT
SUT PROLENE 4 0 RB 1 (SUTURE)
SUT PROLENE 4 0 SH DA (SUTURE) IMPLANT
SUT PROLENE 4-0 RB1 .5 CRCL 36 (SUTURE) IMPLANT
SUT PROLENE 5 0 C 1 36 (SUTURE) ×9 IMPLANT
SUT PROLENE 6 0 C 1 30 (SUTURE) IMPLANT
SUT PROLENE 7 0 BV 1 (SUTURE) ×1 IMPLANT
SUT PROLENE 8 0 BV175 6 (SUTURE) IMPLANT
SUT PROLENE BLUE 7 0 (SUTURE) ×3 IMPLANT
SUT PROLENE POLY MONO (SUTURE) IMPLANT
SUT SILK 2 0 SH CR/8 (SUTURE) IMPLANT
SUT STEEL 6MS V (SUTURE) ×6 IMPLANT
SUT VIC AB 2-0 CT1 27 (SUTURE) ×3
SUT VIC AB 2-0 CT1 TAPERPNT 27 (SUTURE) IMPLANT
SYSTEM SAHARA CHEST DRAIN ATS (WOUND CARE) ×3 IMPLANT
TAPE CLOTH SURG 4X10 WHT LF (GAUZE/BANDAGES/DRESSINGS) ×2 IMPLANT
TAPE PAPER 2X10 WHT MICROPORE (GAUZE/BANDAGES/DRESSINGS) ×1 IMPLANT
TOWEL GREEN STERILE (TOWEL DISPOSABLE) ×3 IMPLANT
TOWEL GREEN STERILE FF (TOWEL DISPOSABLE) ×3 IMPLANT
TRAY FOLEY SLVR 16FR TEMP STAT (SET/KITS/TRAYS/PACK) ×3 IMPLANT
TUBING LAP HI FLOW INSUFFLATIO (TUBING) ×3 IMPLANT
UNDERPAD 30X30 (UNDERPADS AND DIAPERS) ×3 IMPLANT
WATER STERILE IRR 1000ML POUR (IV SOLUTION) ×6 IMPLANT

## 2019-03-12 NOTE — Anesthesia Preprocedure Evaluation (Signed)
Anesthesia Evaluation  Patient identified by MRN, date of birth, ID band Patient awake    Reviewed: Allergy & Precautions, NPO status , Patient's Chart, lab work & pertinent test results  Airway Mallampati: I  TM Distance: >3 FB Neck ROM: Full    Dental   Pulmonary sleep apnea ,    Pulmonary exam normal        Cardiovascular hypertension, Pt. on medications + CAD  Normal cardiovascular exam     Neuro/Psych Depression    GI/Hepatic GERD  Medicated and Controlled,  Endo/Other    Renal/GU      Musculoskeletal   Abdominal   Peds  Hematology   Anesthesia Other Findings   Reproductive/Obstetrics                             Anesthesia Physical Anesthesia Plan  ASA: III  Anesthesia Plan: General   Post-op Pain Management:    Induction: Intravenous  PONV Risk Score and Plan: 2 and Ondansetron and Treatment may vary due to age or medical condition  Airway Management Planned: Oral ETT  Additional Equipment: Arterial line, PA Cath, TEE and Ultrasound Guidance Line Placement  Intra-op Plan:   Post-operative Plan: Post-operative intubation/ventilation  Informed Consent: I have reviewed the patients History and Physical, chart, labs and discussed the procedure including the risks, benefits and alternatives for the proposed anesthesia with the patient or authorized representative who has indicated his/her understanding and acceptance.       Plan Discussed with: CRNA and Surgeon  Anesthesia Plan Comments:         Anesthesia Quick Evaluation

## 2019-03-12 NOTE — Transfer of Care (Signed)
Immediate Anesthesia Transfer of Care Note  Patient: Jared Roberts  Procedure(s) Performed: CORONARY ARTERY BYPASS GRAFTING (CABG) x 3 WITH ENDOSCOPIC HARVESTING OF RIGHT GREATER SAPHENOUS VEIN (N/A Chest) TRANSESOPHAGEAL ECHOCARDIOGRAM (TEE) (N/A )  Patient Location: SICU  Anesthesia Type:General  Level of Consciousness: Patient remains intubated per anesthesia plan  Airway & Oxygen Therapy: Patient remains intubated per anesthesia plan and Patient placed on Ventilator (see vital sign flow sheet for setting)  Post-op Assessment: Report given to RN and Post -op Vital signs reviewed and stable  Post vital signs: Reviewed and stable  Last Vitals:  Vitals Value Taken Time  BP 93/69 03/12/19 1339  Temp 35.3 C 03/12/19 1346  Pulse 74 03/12/19 1346  Resp 12 03/12/19 1346  SpO2 98 % 03/12/19 1346  Vitals shown include unvalidated device data.  Last Pain:  Vitals:   03/12/19 0657  PainSc: 3          Complications: No apparent anesthesia complications

## 2019-03-12 NOTE — Op Note (Signed)
[address]       [place] [address]             [phone]        03/12/2019 Patient:  Luanna Cole Garry Pre-Op Dx: 3V CAD   Post-op Dx:  same Procedure: CABG X 3.  LIMA LAD, RSVG OM, RSVG PDA   Endoscopic greater saphenous vein harvest on the right Intra-operative Transesophageal Echocardiogram  Surgeon and Role:      * Nadya Hopwood, Lucile Crater, MD - Primary    * D. Tacy Dura, PA-C - assisting   EBL:  663ml Blood Administration: none Xclamp Time:  45 min Pump Time:  52min  Drains: 8 F blake drain: L, mediastinal  Wires: none Counts: correct   Indications: 73 yo male presents for surgical evaluation of 3V CAD. He has a long history of fatigue that ultimately lead to a cardiac work-up which found his coronary disease. He does endorse exertional left arm and jaw pain.   Findings: Good conduit, good targets.  Good flows on the vein grafts  Operative Technique: All invasive lines were placed in pre-op holding.  After the risks, benefits and alternatives were thoroughly discussed, the patient was brought to the operative theatre.  Anesthesia was induced, and the patient was prepped and draped in normal sterile fashion.  An appropriate surgical pause was performed, and pre-operative antibiotics were dosed accordingly.  We began with simultaneous incisions were made along the right leg for harvesting of the greater saphenous vein and the chest for the sternotomy.  In regards to the sternotomy, this was carried down with bovie cautery, and the sternum was divided with a reciprocating saw.  Meticulous hemostasis was obtained.  The left internal thoracic artery was exposed and harvested in in pedicled fashion.  The patient was systemically heparinized, and the artery was divided distally, and placed in a papaverine sponge.    The sternal elevator was removed, and a retractor was placed.  The pericardium was divided in the midline and fashioned into a cradle with  pericardial stitches.   After we confirmed an appropriate ACT, the ascending aorta was cannulated in standard fashion.  The right atrial appendage was used for venous cannulation site.  Cardiopulmonary bypass was initiated, and the heart retractor was placed. The cross clamp was applied, and a dose of anterograde cardioplegia was given with good arrest of the heart.  We moved to the posterior wall of the heart, and found a good target on the PDA.  An arteriotomy was made, and the vein graft was anastomosed to it in an end to side fashion.  Next we exposed the lateral wall, and found a good target on the OM.  An end to side anastomosis with the vein graft was then created.  Finally, we exposed a good target on the LAD, and fashioned an end to side anastomosis between it and the LITA.  We began to re-warm, and a re-animation dose of cardioplegia was given.  The heart was de-aired, and the cross clamp was removed.  Meticulous hemostasis was obtained.    A partial occludding clamp was then placed on the ascending aorta, and we created an end to side anastomosis between it and the proximal vein grafts.  The proximal sites were marked with rings.  Hemostasis was obtained, and we separated from cardiopulmonary bypass without event.the heparin was reversed with protamine.  Chest tubes and wires were placed, and the sternum was re-approximated with with sternal  wires.  The soft tissue and skin were re-approximated wth absorbable suture.    The patient tolerated the procedure without any immediate complications, and was transferred to the ICU in guarded condition.  Tristen Pennino Bary Leriche

## 2019-03-12 NOTE — Anesthesia Procedure Notes (Addendum)
Central Venous Catheter Insertion Performed by: Lillia Abed, MD, anesthesiologist Start/End8/19/2020 7:00 AM, 03/12/2019 7:15 AM Patient location: Pre-op. Preanesthetic checklist: patient identified, IV checked, risks and benefits discussed, surgical consent, monitors and equipment checked, pre-op evaluation, timeout performed and anesthesia consent Position: Trendelenburg Lidocaine 1% used for infiltration and patient sedated Hand hygiene performed  and maximum sterile barriers used  Catheter size: 8.5 Fr PA cath was placed.Sheath introducer Swan type:thermodilution Procedure performed using ultrasound guided technique. Ultrasound Notes:anatomy identified, needle tip was noted to be adjacent to the nerve/plexus identified, no ultrasound evidence of intravascular and/or intraneural injection and image(s) printed for medical record Attempts: 1 Following insertion, line sutured and dressing applied. Post procedure assessment: blood return through all ports, free fluid flow and no air  Patient tolerated the procedure well with no immediate complications.

## 2019-03-12 NOTE — Progress Notes (Signed)
Patient ID: Jared Roberts, male   DOB: January 27, 1946, 73 y.o.   MRN: [id number] EVENING ROUNDS NOTE :     [address]       [place] [address]             [phone]                 Day of Surgery Procedure(s) (LRB): CORONARY ARTERY BYPASS GRAFTING (CABG) x 3 WITH ENDOSCOPIC HARVESTING OF RIGHT GREATER SAPHENOUS VEIN (N/A) TRANSESOPHAGEAL ECHOCARDIOGRAM (TEE) (N/A)  Total Length of Stay:  LOS: 0 days  BP 108/64   Pulse 75   Temp (!) 97.3 F (36.3 C)   Resp 11   Ht 5\' 10"  (1.778 m)   Wt 80.7 kg   SpO2 100%   BMI 25.54 kg/m   .Intake/Output      08/18 0701 - 08/19 0700 08/19 0701 - 08/20 0700   I.V. (mL/kg)  1616.9 (20)   Blood  300   IV Piggyback  204.2   Total Intake(mL/kg)  2121.1 (26.3)   Urine (mL/kg/hr)  1335 (1.5)   Emesis/NG output  100   Blood  650   Chest Tube  160   Total Output  2245   Net  -124          . sodium chloride 10 mL/hr at 03/12/19 1700  . [START ON 03/13/2019] sodium chloride    . sodium chloride 20 mL/hr at 03/12/19 1335  . albumin human 12.5 g (03/12/19 1536)  . cefUROXime (ZINACEF)  IV 200 mL/hr at 03/12/19 1700  . dexmedetomidine (PRECEDEX) IV infusion Stopped (03/12/19 1544)  . famotidine (PEPCID) IV Stopped (03/12/19 1408)  . lactated ringers    . lactated ringers Stopped (03/12/19 1632)  . lactated ringers 20 mL/hr at 03/12/19 1700  . magnesium sulfate 20 mL/hr at 03/12/19 1700  . niCARDipine Stopped (03/12/19 1548)  . norepinephrine (LEVOPHED) Adult infusion    . vancomycin       Lab Results  Component Value Date   WBC 16.7 (H) 03/12/2019   HGB 10.9 (L) 03/12/2019   HCT 32.0 (L) 03/12/2019   PLT 120 (L) 03/12/2019   GLUCOSE 108 (H) 03/12/2019   ALT 20 03/05/2019   AST 22 03/05/2019   NA 140 03/12/2019   K 4.3 03/12/2019   CL 109 03/05/2019   CREATININE 1.08 03/05/2019   BUN 22 03/05/2019   CO2 17 (L) 03/05/2019   TSH 3.520 02/13/2018   INR 1.5 (H) 03/12/2019   HGBA1C 5.1 03/05/2019   Almost ready  to extubate Not bleeding ci > 2.0 Neuro intact   Grace Isaac MD  Beeper [phone] Office [phone] 03/12/2019 6:07 PM

## 2019-03-12 NOTE — Discharge Instructions (Signed)

## 2019-03-12 NOTE — Discharge Summary (Signed)
Physician Discharge Summary       [address]       [place] [address]             [phone]    Patient ID: Patric Buckhalter Frampton MRN: [id number] DOB/AGE: May 29, 1946 73 y.o.  Admit date: 03/12/2019 Discharge date: 03/17/2019  Admission Diagnoses: Coronary artery disease  Discharge Diagnoses:  1. S/P CABG x 3 2. Expected abl anemia 3. History of Hyperlipidemia 4. History of Hypertension 5. History of hemorrhoids 6. History of kidney stones 7.History of Helicobacter pylori infection 8. History of gastritis 9. History of chronic bronchitis 10. History of GERD (gastroesophageal reflux disease) 11. History of Generalized osteoarthritis 12. History of Depression 13. History of Male hypogonadism 36. History of Melanoma ([place]) 15. History of prostate cancer 16. History of DDD (degenerative disc disease), cervical   Procedure (s):  CABG X 3.  LIMA LAD, RSVG OM, RSVG PDA   Endoscopic greater saphenous vein harvest on the right Intra-operative Transesophageal Echocardiogram by Dr. Kipp Brood on 03/12/2019.  History of Presenting Illness: This is A 73 yo male presents for surgical evaluation of 3C CAD.  He has a long history of fatigue that ultimately lead to a cardiac work-up which found his coronary disease.  He does endorse exertional left arm and jaw pain.  He has also had a long history of reflux that he now thinks is associated with his coronary disease.  He has some exertional dyspnea, but denies any lower extremity swelling, orthopnea or PND.   He underwent a LHC which showed significant 3V CAD. Normal LV and RV function, and no valvular disease.  LAD, and PDA targets are good caliber.  The OM2 target is small, but the distal circ is adequate. Pre op carotid US showed no significant internal carotid artery stenosis bilaterally. He was admitted on 08/19 in order to undergo a CABG x 3.   Brief Hospital Course:  The patient was extubated the evening of surgery without  difficulty. He/she remained afebrile and hemodynamically stable. Gordy Councilman, a line, chest tubes, and foley were removed early in the post operative course. Lopressor was started and titrated accordingly. He was volume over loaded and diuresed. He had ABL anemia. He did not require a post op transfusion. Last H and H was 10.1 and 29.3. He was weaned off the insulin drip.  The patient's glucose remained well controlled.The patient's HGA1C pre op was 5.1. The patient has beensurgically stable for transfer from the ICU to [facility] but a bed was not available until 08/22. He was restarted on low dose Benicar (Ivesartan was substituted) for better blood pressure control. He continues to progress with cardiac rehab. He was ambulating on room air. He has been tolerating a diet (although not much appetite; no nausea, vomiting, or abdominal pain) and has had a bowel movement. Lopressor was increased for better heart rate and blood pressure control on 08/24. As discussed with Dr. Kipp Brood, the patient is felt surgically stable for discharge today.   Latest Vital Signs: Blood pressure 129/79, pulse (!) 110, temperature 98 F (36.7 C), temperature source Oral, resp. rate 19, height 5\' 10"  (1.778 m), weight 78.8 kg, SpO2 98 %.  Physical Exam: Cardiovascular: Slightly tachycardic Pulmonary: Clear to auscultation bilaterally Abdomen: Soft, non tender, bowel sounds present. Extremities: Trace lower extremity edema. Ecchymosis left forearm (secondary to a line) Wounds: Clean and dry.  No erythema or signs of infection.  Discharge Condition: Stable and discharged to home.  Recent laboratory  studies:  Lab Results  Component Value Date   WBC 10.3 03/15/2019   HGB 10.1 (L) 03/15/2019   HCT 29.3 (L) 03/15/2019   MCV 90.7 03/15/2019   PLT 127 (L) 03/15/2019   Lab Results  Component Value Date   NA 140 03/15/2019   K 3.9 03/15/2019   CL 109 03/15/2019   CO2 24 03/15/2019   CREATININE 0.97 03/15/2019   GLUCOSE  108 (H) 03/15/2019     Diagnostic Studies: Dg Chest 2 View  Result Date: 03/15/2019 CLINICAL DATA:  Postop CABG EXAM: CHEST - 2 VIEW COMPARISON:  03/14/2019 FINDINGS: Small bilateral pleural effusions. Bibasilar atelectasis. There is hyperinflation of the lungs compatible with COPD. Prior CABG. Heart is mildly enlarged. IMPRESSION: COPD. Small bilateral effusions with bibasilar atelectasis. Electronically Signed   By: Rolm Baptise M.D.   On: 03/15/2019 18:42   Dg Chest 2 View  Result Date: 03/05/2019 CLINICAL DATA:  Coronary artery disease EXAM: CHEST - 2 VIEW COMPARISON:  None. FINDINGS: The heart size and mediastinal contours are within normal limits. Both lungs are clear. The visualized skeletal structures are unremarkable. IMPRESSION: No active cardiopulmonary disease. Electronically Signed   By: Constance Holster M.D.   On: 03/05/2019 17:38   Dg Chest Port 1 View  Result Date: 03/14/2019 CLINICAL DATA:  Pleural effusion status post coronary bypass graft. EXAM: PORTABLE CHEST 1 VIEW COMPARISON:  Radiograph of March 13, 2019. FINDINGS: Stable cardiomegaly. Swan-Ganz catheter has been removed. Right internal jugular venous sheath remains. Left-sided chest tube is unchanged in position without definite pneumothorax. Minimal bibasilar subsegmental atelectasis is noted with minimal pleural effusions. Bony thorax is unremarkable. IMPRESSION: Stable position of left-sided chest tube without pneumothorax. Stable minimal bibasilar subsegmental atelectasis with minimal pleural effusions. Electronically Signed   By: Marijo Conception M.D.   On: 03/14/2019 06:47   Dg Chest Port 1 View  Result Date: 03/13/2019 CLINICAL DATA:  Chest tube, post CABG EXAM: PORTABLE CHEST 1 VIEW COMPARISON:  03/12/2019 FINDINGS: Postoperative changes from CABG. Left chest tube and Swan-Ganz catheter remain in place. The Swan-Ganz catheter has been advanced into the right lower lobe pulmonary artery. Interval extubation and  removal of NG tube. Mild cardiomegaly and vascular congestion. Left base atelectasis is similar to prior study. No visible significant effusions. No pneumothorax. IMPRESSION: Slight advancement of the Swan-Ganz catheter into the right lower lobe pulmonary artery. Interval extubation. Cardiomegaly, vascular congestion.  Left base atelectasis. Electronically Signed   By: Rolm Baptise M.D.   On: 03/13/2019 10:27   Dg Chest Port 1 View  Result Date: 03/12/2019 CLINICAL DATA:  Postop day 0 CABG. EXAM: PORTABLE CHEST 1 VIEW COMPARISON:  03/05/2019 and earlier. FINDINGS: Sternotomy for CABG. Endotracheal tube tip in satisfactory position projecting approximately 6 cm above the carina. Nasogastric tube courses below the diaphragm into the stomach. RIGHT jugular Swan-Ganz catheter tip projects over the proximal RIGHT main pulmonary artery. LEFT femoral catheter tip projects over the UPPER heart. Cardiac silhouette normal in size for AP portable technique. Minimal linear atelectasis in the LEFT mid lung. Mild atelectasis involving the LEFT LOWER LOBE. Lungs otherwise clear. Pulmonary vascularity normal. No visible pleural effusions. LEFT chest tube in place with no pneumothorax. IMPRESSION: 1. Support apparatus satisfactory. 2. Mild linear atelectasis involving the LEFT mid lung and mild LEFT LOWER LOBE atelectasis. No acute cardiopulmonary disease otherwise. Electronically Signed   By: Evangeline Dakin M.D.   On: 03/12/2019 14:48   Vas US Doppler Pre Cabg  Result Date: 03/06/2019  PREOPERATIVE VASCULAR EVALUATION  Risk Factors: Hypertension, hyperlipidemia, coronary artery disease. Performing Technologist: Oda Cogan Chief Tech  Examination Guidelines: A complete evaluation includes B-mode imaging, spectral Doppler, color Doppler, and power Doppler as needed of all accessible portions of each vessel. Bilateral testing is considered an integral part of a complete examination. Limited examinations for reoccurring  indications may be performed as noted.  Right Carotid Findings: +----------+--------+--------+--------+--------+------------------+             PSV cm/s EDV cm/s Stenosis Describe Comments            +----------+--------+--------+--------+--------+------------------+  CCA Prox   74       18                                             +----------+--------+--------+--------+--------+------------------+  CCA Distal 65       20                                             +----------+--------+--------+--------+--------+------------------+  ICA Prox   44       18       1-39%             intimal thickening  +----------+--------+--------+--------+--------+------------------+  ICA Distal 60       21                                             +----------+--------+--------+--------+--------+------------------+  ECA        80       17                                             +----------+--------+--------+--------+--------+------------------+ Portions of this table do not appear on this page. +----------+--------+-------+----------------+------------+             PSV cm/s EDV cms Describe         Arm Pressure  +----------+--------+-------+----------------+------------+  Subclavian 89               Multiphasic, WNL 111           +----------+--------+-------+----------------+------------+ +---------+--------+--+--------+--+---------+  Vertebral PSV cm/s 62 EDV cm/s 17 Antegrade  +---------+--------+--+--------+--+---------+ Left Carotid Findings: +----------+--------+--------+--------+--------+------------------+             PSV cm/s EDV cm/s Stenosis Describe Comments            +----------+--------+--------+--------+--------+------------------+  CCA Prox   76       23                                             +----------+--------+--------+--------+--------+------------------+  CCA Distal 78       22                                             +----------+--------+--------+--------+--------+------------------+  ICA Prox    50       18       1-39%             intimal thickening  +----------+--------+--------+--------+--------+------------------+  ICA Distal 75       29                                             +----------+--------+--------+--------+--------+------------------+  ECA        66       21                                             +----------+--------+--------+--------+--------+------------------+ +----------+--------+--------+----------------+------------+  Subclavian PSV cm/s EDV cm/s Describe         Arm Pressure  +----------+--------+--------+----------------+------------+             74                Multiphasic, WNL 113           +----------+--------+--------+----------------+------------+ +---------+--------+--+--------+--+---------+  Vertebral PSV cm/s 47 EDV cm/s 19 Antegrade  +---------+--------+--+--------+--+---------+  ABI Findings: +--------+------------------+-----+---------+--------+  Right    Rt Pressure (mmHg) Index Waveform  Comment   +--------+------------------+-----+---------+--------+  Brachial 111                      triphasic           +--------+------------------+-----+---------+--------+  PTA      148                1.31  triphasic           +--------+------------------+-----+---------+--------+  DP       140                1.24  triphasic           +--------+------------------+-----+---------+--------+ +--------+------------------+-----+---------+-------+  Left     Lt Pressure (mmHg) Index Waveform  Comment  +--------+------------------+-----+---------+-------+  Brachial 113                      triphasic          +--------+------------------+-----+---------+-------+  PTA      149                1.32  triphasic          +--------+------------------+-----+---------+-------+  DP       141                1.25  triphasic          +--------+------------------+-----+---------+-------+ +-------+---------------+----------------+  ABI/TBI Today's ABI/TBI Previous ABI/TBI   +-------+---------------+----------------+  Right   1.3                               +-------+---------------+----------------+  Left    1.3                               +-------+---------------+----------------+  Right Doppler Findings: +-----------+--------+-----+---------+--------+  Site        Pressure Index Doppler   Comments  +-----------+--------+-----+---------+--------+  Brachial    111  triphasic           +-----------+--------+-----+---------+--------+  Radial                     triphasic           +-----------+--------+-----+---------+--------+  Ulnar                      triphasic           +-----------+--------+-----+---------+--------+  Palmar Arch                          WNL       +-----------+--------+-----+---------+--------+  Left Doppler Findings: +-----------+--------+-----+---------+--------+  Site        Pressure Index Doppler   Comments  +-----------+--------+-----+---------+--------+  Brachial    113            triphasic           +-----------+--------+-----+---------+--------+  Radial                     triphasic           +-----------+--------+-----+---------+--------+  Ulnar                      triphasic           +-----------+--------+-----+---------+--------+  Palmar Arch                          WNL       +-----------+--------+-----+---------+--------+  Summary: Right Carotid: Velocities in the right ICA are consistent with a 1-39% stenosis. Left Carotid: Velocities in the left ICA are consistent with a 1-39% stenosis. Vertebrals: Bilateral vertebral arteries demonstrate antegrade flow. Right ABI: Resting right ankle-brachial index is within normal range. No evidence of significant right lower extremity arterial disease. Left ABI: Resting left ankle-brachial index is within normal range. No evidence of significant left lower extremity arterial disease. Bilateral Extremity: Doppler waveforms remain within normal limits with compression bilaterally for the radial  arteries. Doppler waveforms remain within normal limits with compression bilaterally for the ulnar arteries.  Electronically signed by Curt Jews MD on 03/06/2019 at 7:15:26 AM.    Final        Discharge Instructions    Amb Referral to Cardiac Rehabilitation   Complete by: As directed    [email or web address]   Diagnosis: CABG   CABG X ___: 3   After initial evaluation and assessments completed: Virtual Based Care may be provided alone or in conjunction with Phase 2 Cardiac Rehab based on patient barriers.: Yes      Discharge Medications: Allergies as of 03/17/2019   No Known Allergies     Medication List    STOP taking these medications   hydrochlorothiazide 25 MG tablet Commonly known as: HYDRODIURIL   nitroGLYCERIN 0.4 MG SL tablet Commonly known as: NITROSTAT   verapamil 240 MG 24 hr capsule Commonly known as: VERELAN PM     TAKE these medications   acetaminophen 500 MG tablet Commonly known as: TYLENOL Take 1 tablet (500 mg total) by mouth every 4 (four) hours as needed for mild pain.   ascorbic acid 1000 MG tablet Commonly known as: VITAMIN C Take 1,000 mg by mouth daily.   aspirin 325 MG EC tablet Take 1 tablet (325 mg total) by mouth daily. What changed:   medication strength  how much to take  when  to take this   atorvastatin 10 MG tablet Commonly known as: LIPITOR Take 10 mg by mouth at bedtime.   calcium carbonate 500 MG chewable tablet Commonly known as: TUMS - dosed in mg elemental calcium Chew 1-2 tablets by mouth 3 (three) times daily as needed for indigestion or heartburn.   furosemide 40 MG tablet Commonly known as: LASIX Take 1 tablet (40 mg total) by mouth daily. For 5 days then stop.   ibuprofen 200 MG tablet Commonly known as: ADVIL Take 400 mg by mouth every 6 (six) hours as needed.   metoprolol tartrate 25 MG tablet Commonly known as: LOPRESSOR Take 1 tablet (25 mg total) by mouth 2 (two) times daily.   multivitamin with minerals  Tabs tablet Take 1 tablet by mouth daily.   olmesartan 20 MG tablet Commonly known as: BENICAR Take 1 tablet (20 mg total) by mouth daily. What changed:   medication strength  how much to take   potassium chloride SA 20 MEQ tablet Commonly known as: K-DUR Take 1 tablet (20 mEq total) by mouth daily. For 5 days then stop.   PROBIOTIC DAILY PO Take 1 capsule by mouth daily.   traMADol 50 MG tablet Commonly known as: ULTRAM Take 1 tablet (50 mg total) by mouth every 4 (four) hours as needed for moderate pain.   Vitamin D 50 MCG (2000 UT) Caps Take 2,000 Units by mouth 2 (two) times a day.      The patient has been discharged on:   1.Beta Blocker:  Yes [  x ]                              No   [   ]                              If No, reason:  2.Ace Inhibitor/ARB: Yes [ x  ]                                     No  [    ]                                     If No, reason:  3.Statin:   Yes [ x  ]                  No  [   ]                  If No, reason:  4.Ecasa:  Yes  [ x  ]                  No   [   ]                  If No, reason:  Follow Up Appointments: Follow-up Information    Nahser, Wonda Cheng, MD. Go on 03/26/2019.   Specialty: Cardiology Why: Appointment time is at 1:40 pm Contact information: [address] [id number] [phone]        [name], MD. Go on 03/21/2019.   Specialty: Thoracic Surgery Why: Appointment time is at 10:15 am Contact information: [address] [place] [phone]           Signed: Sharalyn Ink [facility] 03/17/2019, 9:30 AM

## 2019-03-12 NOTE — Anesthesia Procedure Notes (Signed)
Procedure Name: Intubation Date/Time: 03/12/2019 9:12 AM Performed by: Inda Coke, CRNA Pre-anesthesia Checklist: Patient identified, Emergency Drugs available, Suction available and Patient being monitored Patient Re-evaluated:Patient Re-evaluated prior to induction Oxygen Delivery Method: Circle System Utilized Preoxygenation: Pre-oxygenation with 100% oxygen Induction Type: IV induction Ventilation: Mask ventilation without difficulty Laryngoscope Size: Mac and 4 Grade View: Grade I Tube type: Oral Tube size: 8.0 mm Number of attempts: 1 Airway Equipment and Method: Stylet and Oral airway Placement Confirmation: ETT inserted through vocal cords under direct vision,  positive ETCO2 and breath sounds checked- equal and bilateral Secured at: 24 cm Tube secured with: Tape Dental Injury: Teeth and Oropharynx as per pre-operative assessment

## 2019-03-12 NOTE — Plan of Care (Signed)

## 2019-03-12 NOTE — Anesthesia Procedure Notes (Addendum)
Arterial Line Insertion Start/End8/19/2020 8:00 AM, 03/12/2019 8:15 AM Performed by: Lillia Abed, MD, anesthesiologist  Preanesthetic checklist: patient identified, IV checked, site marked, risks and benefits discussed, surgical consent, monitors and equipment checked, pre-op evaluation, timeout performed and anesthesia consent Lidocaine 1% used for infiltration and patient sedated Left, brachial was placed Catheter size: 20 G Hand hygiene performed  and maximum sterile barriers used  Allen's test indicative of satisfactory collateral circulation Attempts: 1 Procedure performed without using ultrasound guided technique. Ultrasound Notes:anatomy identified Following insertion, dressing applied and Biopatch. Post procedure assessment: decreased circulation  Patient tolerated the procedure well with no immediate complications.

## 2019-03-12 NOTE — Anesthesia Postprocedure Evaluation (Signed)
Anesthesia Post Note  Patient: Luanna Cole Minchey  Procedure(s) Performed: CORONARY ARTERY BYPASS GRAFTING (CABG) x 3 WITH ENDOSCOPIC HARVESTING OF RIGHT GREATER SAPHENOUS VEIN (N/A Chest) TRANSESOPHAGEAL ECHOCARDIOGRAM (TEE) (N/A )     Patient location during evaluation: SICU Anesthesia Type: General Level of consciousness: sedated Pain management: pain level controlled Vital Signs Assessment: post-procedure vital signs reviewed and stable Respiratory status: patient remains intubated per anesthesia plan Cardiovascular status: stable Postop Assessment: no apparent nausea or vomiting Anesthetic complications: no    Last Vitals:  Vitals:   03/12/19 1400 03/12/19 1500  BP: 96/75 112/82  Pulse: 71 68  Resp: 12 12  Temp: (!) 35.3 C (!) 34.9 C  SpO2: 99% 100%    Last Pain:  Vitals:   03/12/19 1340  TempSrc: Core  PainSc:                  Rasha Ibe DAVID

## 2019-03-12 NOTE — Brief Op Note (Signed)
03/12/2019  11:52 AM  PATIENT:  Jared Roberts  73 y.o. male  PRE-OPERATIVE DIAGNOSIS:  Coronary artery disease  POST-OPERATIVE DIAGNOSIS:  Coronary artery disease  PROCEDURE: TRANSESOPHAGEAL ECHOCARDIOGRAM (TEE), MEDIAN STERNOTOMY for CORONARY ARTERY BYPASS GRAFTING (CABG) x 3 (LIMA to LAD, SVG to OM, SVG to PDA) WITH ENDOSCOPIC HARVESTING OF RIGHT GREATER SAPHENOUS VEIN and LEFT INTERNAL MAMMARY ARTERY   SURGEON:  Surgeon(s) and Role:    Lightfoot, Lucile Crater, MD - Primary  PHYSICIAN ASSISTANT: Lars Pinks PA-C  ANESTHESIA:   general  EBL:  Per anesthesia and perfusion record   DRAINS: Chest tubes placed in the mediastinal and pleural spaces   COUNTS CORRECT:  YES  DICTATION: .Dragon Dictation  PLAN OF CARE: Admit to inpatient   PATIENT DISPOSITION:  ICU - intubated and hemodynamically stable.   Delay start of Pharmacological VTE agent (>24hrs) due to surgical blood loss or risk of bleeding: yes  BASELINE WEIGHT: 80.7 kg

## 2019-03-12 NOTE — Procedures (Signed)
Extubation Procedure Note  Patient Details:   Name: Jared Roberts DOB: 10-26-1945 MRN: [id number]   Airway Documentation:    Vent end date: 03/12/19 Vent end time: 1814   Evaluation  O2 sats: stable throughout Complications: No apparent complications Patient did tolerate procedure well. Bilateral Breath Sounds: Clear, Diminished  Patient has ability to talk Yes  [name] 03/12/2019, 6:15 PM

## 2019-03-13 ENCOUNTER — Encounter (HOSPITAL_COMMUNITY): Payer: Self-pay | Admitting: Thoracic Surgery (Cardiothoracic Vascular Surgery)

## 2019-03-13 ENCOUNTER — Inpatient Hospital Stay (HOSPITAL_COMMUNITY): Payer: Medicare HMO

## 2019-03-13 LAB — BASIC METABOLIC PANEL
Anion gap: 6 (ref 5–15)
Anion gap: 9 (ref 5–15)
BUN: 16 mg/dL (ref 8–23)
BUN: 18 mg/dL (ref 8–23)
CO2: 21 mmol/L — ABNORMAL LOW (ref 22–32)
CO2: 21 mmol/L — ABNORMAL LOW (ref 22–32)
Calcium: 7.9 mg/dL — ABNORMAL LOW (ref 8.9–10.3)
Calcium: 8.2 mg/dL — ABNORMAL LOW (ref 8.9–10.3)
Chloride: 109 mmol/L (ref 98–111)
Chloride: 104 mmol/L (ref 98–111)
Creatinine, Ser: 1.01 mg/dL (ref 0.61–1.24)
Creatinine, Ser: 1.19 mg/dL (ref 0.61–1.24)
GFR calc Af Amer: >60 mL/min (ref >60)
GFR calc Af Amer: >60 mL/min (ref >60)
GFR calc non Af Amer: >60 mL/min (ref >60)
GFR calc non Af Amer: 59 mL/min — ABNORMAL LOW (ref >60)
Glucose, Bld: 142 mg/dL — ABNORMAL HIGH (ref 70–99)
Glucose, Bld: 178 mg/dL — ABNORMAL HIGH (ref 70–99)
Potassium: 3.9 mmol/L (ref 3.5–5.1)
Potassium: 4 mmol/L (ref 3.5–5.1)
Sodium: 136 mmol/L (ref 135–145)
Sodium: 134 mmol/L — ABNORMAL LOW (ref 135–145)

## 2019-03-13 LAB — CBC
HCT: 30.2 % — ABNORMAL LOW (ref 39.0–52.0)
HCT: 30.6 % — ABNORMAL LOW (ref 39.0–52.0)
Hemoglobin: 10.5 g/dL — ABNORMAL LOW (ref 13.0–17.0)
Hemoglobin: 10.7 g/dL — ABNORMAL LOW (ref 13.0–17.0)
MCH: 31.5 pg (ref 26.0–34.0)
MCH: 32 pg (ref 26.0–34.0)
MCHC: 34.8 g/dL (ref 30.0–36.0)
MCHC: 35 g/dL (ref 30.0–36.0)
MCV: 90.7 fL (ref 80.0–100.0)
MCV: 91.6 fL (ref 80.0–100.0)
Platelets: 123 10*3/uL — ABNORMAL LOW (ref 150–400)
Platelets: 132 10*3/uL — ABNORMAL LOW (ref 150–400)
RBC: 3.33 MIL/uL — ABNORMAL LOW (ref 4.22–5.81)
RBC: 3.34 MIL/uL — ABNORMAL LOW (ref 4.22–5.81)
RDW: 11.9 % (ref 11.5–15.5)
RDW: 12.3 % (ref 11.5–15.5)
WBC: 14.8 10*3/uL — ABNORMAL HIGH (ref 4.0–10.5)
WBC: 18.3 10*3/uL — ABNORMAL HIGH (ref 4.0–10.5)
nRBC: 0 % (ref 0.0–0.2)
nRBC: 0 % (ref 0.0–0.2)

## 2019-03-13 LAB — MAGNESIUM
Magnesium: 2.4 mg/dL (ref 1.7–2.4)
Magnesium: 2.4 mg/dL (ref 1.7–2.4)

## 2019-03-13 LAB — GLUCOSE, CAPILLARY
Glucose-Capillary: 129 mg/dL — ABNORMAL HIGH (ref 70–99)
Glucose-Capillary: 102 mg/dL — ABNORMAL HIGH (ref 70–99)
Glucose-Capillary: 107 mg/dL — ABNORMAL HIGH (ref 70–99)
Glucose-Capillary: 123 mg/dL — ABNORMAL HIGH (ref 70–99)
Glucose-Capillary: 129 mg/dL — ABNORMAL HIGH (ref 70–99)
Glucose-Capillary: 146 mg/dL — ABNORMAL HIGH (ref 70–99)
Glucose-Capillary: 80 mg/dL (ref 70–99)
Glucose-Capillary: 98 mg/dL (ref 70–99)
Glucose-Capillary: 157 mg/dL — ABNORMAL HIGH (ref 70–99)
Glucose-Capillary: 161 mg/dL — ABNORMAL HIGH (ref 70–99)
Glucose-Capillary: 180 mg/dL — ABNORMAL HIGH (ref 70–99)
Glucose-Capillary: 135 mg/dL — ABNORMAL HIGH (ref 70–99)

## 2019-03-13 MED ORDER — FUROSEMIDE 40 MG PO TABS: 40.0000 mg | ORAL_TABLET | Freq: Every day | ORAL | Status: DC

## 2019-03-13 MED ORDER — INSULIN ASPART 100 UNIT/ML ~~LOC~~ SOLN
0.0000 [IU] | SUBCUTANEOUS | Status: DC
  Administered 2019-03-13: 2 [IU] via SUBCUTANEOUS
  Administered 2019-03-13 (×2): 4 [IU] via SUBCUTANEOUS
  Administered 2019-03-14: 2 [IU] via SUBCUTANEOUS
  Administered 2019-03-14: 4 [IU] via SUBCUTANEOUS

## 2019-03-13 MED ORDER — POTASSIUM CHLORIDE CRYS ER 20 MEQ PO TBCR
20.0000 meq | EXTENDED_RELEASE_TABLET | ORAL | Status: AC
  Administered 2019-03-13 (×2): 20 meq via ORAL
  Filled 2019-03-13 (×3): qty 1

## 2019-03-13 MED ORDER — POTASSIUM CHLORIDE CRYS ER 20 MEQ PO TBCR
40.0000 meq | EXTENDED_RELEASE_TABLET | Freq: Every day | ORAL | Status: DC
  Administered 2019-03-14 – 2019-03-16 (×3): 40 meq via ORAL
  Filled 2019-03-13 (×3): qty 2

## 2019-03-13 MED ORDER — CHLORHEXIDINE GLUCONATE CLOTH 2 % EX PADS
6.0000 | MEDICATED_PAD | Freq: Every day | CUTANEOUS | Status: DC
  Administered 2019-03-13 – 2019-03-15 (×3): 6 via TOPICAL

## 2019-03-13 MED ORDER — FUROSEMIDE 40 MG PO TABS
40.0000 mg | ORAL_TABLET | Freq: Every day | ORAL | Status: DC
  Administered 2019-03-14 – 2019-03-17 (×4): 40 mg via ORAL
  Filled 2019-03-13 (×4): qty 1

## 2019-03-13 MED ORDER — POTASSIUM CHLORIDE CRYS ER 20 MEQ PO TBCR: 40.0000 meq | EXTENDED_RELEASE_TABLET | Freq: Every day | ORAL | Status: DC

## 2019-03-13 MED ORDER — ENOXAPARIN SODIUM 30 MG/0.3ML ~~LOC~~ SOLN
30.0000 mg | Freq: Every day | SUBCUTANEOUS | Status: DC
  Administered 2019-03-13 – 2019-03-16 (×4): 30 mg via SUBCUTANEOUS
  Filled 2019-03-13 (×4): qty 0.3

## 2019-03-13 MED FILL — Heparin Sodium (Porcine) Inj 1000 Unit/ML: INTRAMUSCULAR | Qty: 30 | Status: AC

## 2019-03-13 MED FILL — Lidocaine HCl Local Preservative Free (PF) Inj 2%: INTRAMUSCULAR | Qty: 15 | Status: AC

## 2019-03-13 MED FILL — Potassium Chloride Inj 2 mEq/ML: INTRAVENOUS | Qty: 20 | Status: AC

## 2019-03-13 NOTE — Progress Notes (Signed)
[facility] Evening rounds  POD #1 s/p CABG  No complaints except mild discomfort from chest tubes Well appearing, NAD CTA RRR A/p: continue present management  B. Murvin Natal, MD

## 2019-03-13 NOTE — Progress Notes (Signed)
      [address]       [place] [address]             [phone]                 1 Day Post-Op Procedure(s) (LRB): CORONARY ARTERY BYPASS GRAFTING (CABG) x 3 WITH ENDOSCOPIC HARVESTING OF RIGHT GREATER SAPHENOUS VEIN (N/A) TRANSESOPHAGEAL ECHOCARDIOGRAM (TEE) (N/A)   Events: Extubated yesterday at 6pm.  Doing well.  Received 1st BB _______________________________________________________________ Vitals: BP 111/69   Pulse 78   Temp 99 F (37.2 C)   Resp (!) 21   Ht 5\' 10"  (1.778 m)   Wt 85.2 kg   SpO2 97%   BMI 26.95 kg/m   - Neuro: alert NAD  - Cardiovascular: sinus, SS chest tube output  Drips: none.   PAP: (24-38)/(7-16) 31/16 CO:  [2.9 L/min-6.2 L/min] 6.2 L/min CI:  [1.5 L/min/m2-3.1 L/min/m2] 3.1 L/min/m2  - Pulm: EWOB,   ABG    Component Value Date/Time   PHART 7.360 03/12/2019 1937   PCO2ART 34.7 03/12/2019 1937   PO2ART 99.0 03/12/2019 1937   HCO3 19.6 (L) 03/12/2019 1937   TCO2 19 (L) 03/12/2019 1956   ACIDBASEDEF 5.0 (H) 03/12/2019 1937   O2SAT 97.0 03/12/2019 1937    - Abd: soft ND - Extremity: SCDs in place  .Intake/Output      08/19 0701 - 08/20 0700 08/20 0701 - 08/21 0700   I.V. (mL/kg) 2270 (26.6)    Blood 300    IV Piggyback 863.3    Total Intake(mL/kg) 3433.3 (40.3)    Urine (mL/kg/hr) 2055 (1)    Emesis/NG output 100    Blood 650    Chest Tube 373    Total Output 3178    Net +255.3            _______________________________________________________________ Labs: CBC Latest Ref Rng & Units 03/13/2019 03/12/2019 03/12/2019  WBC 4.0 - 10.5 K/uL 14.8(H) - -  Hemoglobin 13.0 - 17.0 g/dL 10.5(L) 10.2(L) 10.5(L)  Hematocrit 39.0 - 52.0 % 30.2(L) 30.0(L) 31.0(L)  Platelets 150 - 400 K/uL 123(L) - -   CMP Latest Ref Rng & Units 03/13/2019 03/12/2019 03/12/2019  Glucose 70 - 99 mg/dL 142(H) 148(H) -  BUN 8 - 23 mg/dL 16 16 -  Creatinine 0.61 - 1.24 mg/dL 1.01 0.90 -  Sodium 135 - 145 mmol/L 136 139 140  Potassium  3.5 - 5.1 mmol/L 3.9 4.5 4.6  Chloride 98 - 111 mmol/L 109 108 -  CO2 22 - 32 mmol/L 21(L) - -  Calcium 8.9 - 10.3 mg/dL 7.9(L) - -  Total Protein 6.5 - 8.1 g/dL - - -  Total Bilirubin 0.3 - 1.2 mg/dL - - -  Alkaline Phos 38 - 126 U/L - - -  AST 15 - 41 U/L - - -  ALT 0 - 44 U/L - - -    CXR: Good expansion, trace effusion on the left  _______________________________________________________________  Assessment and Plan: POD 1 s/p CABG 3  Neuro: pain controlled CV: on ASB.  Removing swan, and A line.  Tubes bulbed Pulm: pulm toilet Renal: good uop.  Creat stable GI: advancing diet Heme: stable.   ID: afebrile Endo: transition to SSI Dispo: floor today  Melodie Bouillon, MD 03/13/2019 8:24 AM

## 2019-03-14 ENCOUNTER — Inpatient Hospital Stay (HOSPITAL_COMMUNITY): Payer: Medicare HMO

## 2019-03-14 LAB — BASIC METABOLIC PANEL
Anion gap: 6 (ref 5–15)
BUN: 14 mg/dL (ref 8–23)
CO2: 24 mmol/L (ref 22–32)
Calcium: 8.5 mg/dL — ABNORMAL LOW (ref 8.9–10.3)
Chloride: 106 mmol/L (ref 98–111)
Creatinine, Ser: 1.07 mg/dL (ref 0.61–1.24)
GFR calc Af Amer: >60 mL/min (ref >60)
GFR calc non Af Amer: >60 mL/min (ref >60)
Glucose, Bld: 118 mg/dL — ABNORMAL HIGH (ref 70–99)
Potassium: 4.4 mmol/L (ref 3.5–5.1)
Sodium: 136 mmol/L (ref 135–145)

## 2019-03-14 LAB — GLUCOSE, CAPILLARY
Glucose-Capillary: 107 mg/dL — ABNORMAL HIGH (ref 70–99)
Glucose-Capillary: 109 mg/dL — ABNORMAL HIGH (ref 70–99)
Glucose-Capillary: 112 mg/dL — ABNORMAL HIGH (ref 70–99)
Glucose-Capillary: 137 mg/dL — ABNORMAL HIGH (ref 70–99)
Glucose-Capillary: 149 mg/dL — ABNORMAL HIGH (ref 70–99)
Glucose-Capillary: 192 mg/dL — ABNORMAL HIGH (ref 70–99)

## 2019-03-14 LAB — CBC
HCT: 28.9 % — ABNORMAL LOW (ref 39.0–52.0)
Hemoglobin: 10 g/dL — ABNORMAL LOW (ref 13.0–17.0)
MCH: 31.7 pg (ref 26.0–34.0)
MCHC: 34.6 g/dL (ref 30.0–36.0)
MCV: 91.7 fL (ref 80.0–100.0)
Platelets: 123 10*3/uL — ABNORMAL LOW (ref 150–400)
RBC: 3.15 MIL/uL — ABNORMAL LOW (ref 4.22–5.81)
RDW: 12.2 % (ref 11.5–15.5)
WBC: 13.7 10*3/uL — ABNORMAL HIGH (ref 4.0–10.5)
nRBC: 0 % (ref 0.0–0.2)

## 2019-03-14 MED ORDER — SODIUM CHLORIDE 0.9% FLUSH
3.0000 mL | Freq: Two times a day (BID) | INTRAVENOUS | Status: DC
  Administered 2019-03-14: 3 mL via INTRAVENOUS

## 2019-03-14 MED ORDER — MOVING RIGHT ALONG BOOK
Freq: Once | Status: AC
  Administered 2019-03-14: 12:00:00
  Filled 2019-03-14: qty 1

## 2019-03-14 MED ORDER — INSULIN ASPART 100 UNIT/ML ~~LOC~~ SOLN
0.0000 [IU] | Freq: Three times a day (TID) | SUBCUTANEOUS | Status: DC
  Administered 2019-03-14: 2 [IU] via SUBCUTANEOUS

## 2019-03-14 MED ORDER — SODIUM CHLORIDE 0.9% FLUSH: 3.0000 mL | INTRAVENOUS | Status: DC | PRN

## 2019-03-14 MED ORDER — SODIUM CHLORIDE 0.9 % IV SOLN: 250.0000 mL | INTRAVENOUS | Status: DC | PRN

## 2019-03-14 NOTE — Progress Notes (Signed)
Patient ID: Jared Roberts, male   DOB: 04/19/1946, 73 y.o.   MRN: [id number] EVENING ROUNDS NOTE :     [address]       [place] [address]             [phone]                 2 Days Post-Op Procedure(s) (LRB): CORONARY ARTERY BYPASS GRAFTING (CABG) x 3 WITH ENDOSCOPIC HARVESTING OF RIGHT GREATER SAPHENOUS VEIN (N/A) TRANSESOPHAGEAL ECHOCARDIOGRAM (TEE) (N/A)  Total Length of Stay:  LOS: 2 days  BP 136/82   Pulse 87   Temp 98.6 F (37 C) (Oral)   Resp 12   Ht 5\' 10"  (1.778 m)   Wt 84.7 kg   SpO2 95%   BMI 26.79 kg/m   .Intake/Output      08/20 0701 - 08/21 0700 08/21 0701 - 08/22 0700   I.V. (mL/kg) 480.1 (5.7) 52.2 (0.6)   Blood     IV Piggyback 199.9    Total Intake(mL/kg) 680.1 (8) 52.2 (0.6)   Urine (mL/kg/hr) 1700 (0.8) 1000 (1)   Emesis/NG output     Blood     Chest Tube 250 20   Total Output 1950 1020   Net -1269.9 -967.8        Urine Occurrence  1 x     . sodium chloride Stopped (03/13/19 0937)  . sodium chloride 250 mL (03/12/19 1834)  . sodium chloride 20 mL/hr at 03/12/19 1900  . sodium chloride    . lactated ringers    . lactated ringers Stopped (03/13/19 0740)  . lactated ringers Stopped (03/14/19 0936)     Lab Results  Component Value Date   WBC 13.7 (H) 03/14/2019   HGB 10.0 (L) 03/14/2019   HCT 28.9 (L) 03/14/2019   PLT 123 (L) 03/14/2019   GLUCOSE 118 (H) 03/14/2019   ALT 20 03/05/2019   AST 22 03/05/2019   NA 136 03/14/2019   K 4.4 03/14/2019   CL 106 03/14/2019   CREATININE 1.07 03/14/2019   BUN 14 03/14/2019   CO2 24 03/14/2019   TSH 3.520 02/13/2018   INR 1.5 (H) 03/12/2019   HGBA1C 5.1 03/05/2019   Stable day Waiting for step down bed   Grace Isaac MD  Beeper [phone] Office [phone] 03/14/2019 6:33 PM

## 2019-03-14 NOTE — Progress Notes (Addendum)
TCTS DAILY ICU PROGRESS NOTE                   [address]            [place] [address]          [phone]   2 Days Post-Op Procedure(s) (LRB): CORONARY ARTERY BYPASS GRAFTING (CABG) x 3 WITH ENDOSCOPIC HARVESTING OF RIGHT GREATER SAPHENOUS VEIN (N/A) TRANSESOPHAGEAL ECHOCARDIOGRAM (TEE) (N/A)  Total Length of Stay:  LOS: 2 days   Subjective: Patient sitting in chair. He thinks he will breathe better and have less pain  when chest tube removed  Objective: Vital signs in last 24 hours: Temp:  [97.7 F (36.5 C)-98.2 F (36.8 C)] 98.2 F (36.8 C) (08/21 0400) Pulse Rate:  [65-78] 65 (08/21 0700) Cardiac Rhythm: Normal sinus rhythm (08/21 0400) Resp:  [11-24] 16 (08/21 0700) BP: (83-133)/(58-76) 118/71 (08/21 0700) SpO2:  [89 %-97 %] 96 % (08/21 0700) Arterial Line BP: (106)/(51) 106/51 (08/20 0900) Weight:  [84.7 kg] 84.7 kg (08/21 0500)  Filed Weights   03/12/19 0657 03/13/19 0500 03/14/19 0500  Weight: 80.7 kg 85.2 kg 84.7 kg    Weight change: -0.5 kg   Hemodynamic parameters for last 24 hours: PAP: (28)/(8) 28/8  Intake/Output from previous day: 08/20 0701 - 08/21 0700 In: 680.1 [I.V.:480.1; IV Piggyback:199.9] Out: 1950 [Urine:1700; Chest Tube:250]  Intake/Output this shift: Total I/O In: 20 [I.V.:20] Out: 20 [Chest Tube:20]  Current Meds: Scheduled Meds: . acetaminophen  1,000 mg Oral Q6H   Or  . acetaminophen (TYLENOL) oral liquid 160 mg/5 mL  1,000 mg Per Tube Q6H  . aspirin EC  325 mg Oral Daily   Or  . aspirin  324 mg Per Tube Daily  . atorvastatin  10 mg Oral QHS  . bisacodyl  10 mg Oral Daily   Or  . bisacodyl  10 mg Rectal Daily  . Chlorhexidine Gluconate Cloth  6 each Topical Daily  . Chlorhexidine Gluconate Cloth  6 each Topical Daily  . docusate sodium  200 mg Oral Daily  . enoxaparin (LOVENOX) injection  30 mg Subcutaneous QHS  . furosemide  40 mg Oral Daily  . insulin aspart  0-24 Units Subcutaneous Q4H  .  metoprolol tartrate  12.5 mg Oral BID   Or  . metoprolol tartrate  12.5 mg Per Tube BID  . multivitamin with minerals  1 tablet Oral Daily  . pantoprazole  40 mg Oral Daily  . potassium chloride  40 mEq Oral Daily  . sodium chloride flush  10-40 mL Intracatheter Q12H  . sodium chloride flush  3 mL Intravenous Q12H   Continuous Infusions: . sodium chloride Stopped (03/13/19 0937)  . sodium chloride 250 mL (03/12/19 1834)  . sodium chloride 20 mL/hr at 03/12/19 1900  . lactated ringers    . lactated ringers Stopped (03/13/19 0740)  . lactated ringers 20 mL/hr at 03/14/19 0800   PRN Meds:.sodium chloride, lactated ringers, metoprolol tartrate, morphine injection, ondansetron (ZOFRAN) IV, oxyCODONE, sodium chloride flush, sodium chloride flush, traMADol  General appearance: alert, cooperative and no distress Neurologic: intact Heart: RRR Lungs: Diminished bibasilar breath sounds Abdomen: Soft, non tender, bowel sounds present Extremities: [place] LE edema R>L Wound: Aquacel intact;RLE dressing removed and wound is clean and dry  Lab Results: CBC: Recent Labs    03/13/19 1510 03/14/19 0450  WBC 18.3* 13.7*  HGB 10.7* 10.0*  HCT 30.6* 28.9*  PLT 132* 123*   BMET:  Recent  Labs    03/13/19 1510 03/14/19 0450  NA 134* 136  K 4.0 4.4  CL 104 106  CO2 21* 24  GLUCOSE 178* 118*  BUN 18 14  CREATININE 1.19 1.07  CALCIUM 8.2* 8.5*    CMET: Lab Results  Component Value Date   WBC 13.7 (H) 03/14/2019   HGB 10.0 (L) 03/14/2019   HCT 28.9 (L) 03/14/2019   PLT 123 (L) 03/14/2019   GLUCOSE 118 (H) 03/14/2019   ALT 20 03/05/2019   AST 22 03/05/2019   NA 136 03/14/2019   K 4.4 03/14/2019   CL 106 03/14/2019   CREATININE 1.07 03/14/2019   BUN 14 03/14/2019   CO2 24 03/14/2019   TSH 3.520 02/13/2018   INR 1.5 (H) 03/12/2019   HGBA1C 5.1 03/05/2019      PT/INR:  Recent Labs    03/12/19 1329  LABPROT 17.7*  INR 1.5*   Radiology: Dg Chest Port 1 View  Result  Date: 03/14/2019 CLINICAL DATA:  Pleural effusion status post coronary bypass graft. EXAM: PORTABLE CHEST 1 VIEW COMPARISON:  Radiograph of March 13, 2019. FINDINGS: Stable cardiomegaly. Swan-Ganz catheter has been removed. Right internal jugular venous sheath remains. Left-sided chest tube is unchanged in position without definite pneumothorax. Minimal bibasilar subsegmental atelectasis is noted with minimal pleural effusions. Bony thorax is unremarkable. IMPRESSION: Stable position of left-sided chest tube without pneumothorax. Stable minimal bibasilar subsegmental atelectasis with minimal pleural effusions. Electronically Signed   By: Marijo Conception M.D.   On: 03/14/2019 06:47     Assessment/Plan: S/P Procedure(s) (LRB): CORONARY ARTERY BYPASS GRAFTING (CABG) x 3 WITH ENDOSCOPIC HARVESTING OF RIGHT GREATER SAPHENOUS VEIN (N/A) TRANSESOPHAGEAL ECHOCARDIOGRAM (TEE) (N/A)   1. CV-SR. On Lopressor 12.5 mg bid. Hope to start ACE/ARB once BP improves. 2. Pulmonary-On 2 liters of oxygen via [place]. Chest tube with 250 cc last 24 hours. Will discuss with Dr. Kipp Brood when to remove. CXR this am appears to show cardiomegaly, small pleural effusions/atelectasis. Encourage incentive spirometer. 3. Expected ABL anemia-H and H this am 10 and 28.9 4. Volume overload-on Lasix 40 mg daily 5. Mild thrombocytopenia-platelets 123,000 this am 6. CBGs 135/137/109. Pre op HGA1C 5.1. Will stop accu checks and SS PRN. 7. Transfer when bed available    Donielle Liston Alba PA-C 03/14/2019 8:01 AM   Agree with above Will remove chest tubes Diuresing today Transfer to floor today

## 2019-03-15 ENCOUNTER — Inpatient Hospital Stay (HOSPITAL_COMMUNITY): Payer: Medicare HMO

## 2019-03-15 LAB — CBC
HCT: 29.3 % — ABNORMAL LOW (ref 39.0–52.0)
Hemoglobin: 10.1 g/dL — ABNORMAL LOW (ref 13.0–17.0)
MCH: 31.3 pg (ref 26.0–34.0)
MCHC: 34.5 g/dL (ref 30.0–36.0)
MCV: 90.7 fL (ref 80.0–100.0)
Platelets: 127 10*3/uL — ABNORMAL LOW (ref 150–400)
RBC: 3.23 MIL/uL — ABNORMAL LOW (ref 4.22–5.81)
RDW: 12.1 % (ref 11.5–15.5)
WBC: 10.3 10*3/uL (ref 4.0–10.5)
nRBC: 0 % (ref 0.0–0.2)

## 2019-03-15 LAB — BASIC METABOLIC PANEL
Anion gap: 7 (ref 5–15)
BUN: 11 mg/dL (ref 8–23)
CO2: 24 mmol/L (ref 22–32)
Calcium: 8.2 mg/dL — ABNORMAL LOW (ref 8.9–10.3)
Chloride: 109 mmol/L (ref 98–111)
Creatinine, Ser: 0.97 mg/dL (ref 0.61–1.24)
GFR calc Af Amer: >60 mL/min (ref >60)
GFR calc non Af Amer: >60 mL/min (ref >60)
Glucose, Bld: 108 mg/dL — ABNORMAL HIGH (ref 70–99)
Potassium: 3.9 mmol/L (ref 3.5–5.1)
Sodium: 140 mmol/L (ref 135–145)

## 2019-03-15 LAB — GLUCOSE, CAPILLARY
Glucose-Capillary: 113 mg/dL — ABNORMAL HIGH (ref 70–99)
Glucose-Capillary: 111 mg/dL — ABNORMAL HIGH (ref 70–99)

## 2019-03-15 NOTE — Progress Notes (Signed)
Patient ID: Jared Roberts, male   DOB: 12-28-1945, 73 y.o.   MRN: [id number] TCTS DAILY ICU PROGRESS NOTE                   [address]            [place] [address]          [phone]   3 Days Post-Op Procedure(s) (LRB): CORONARY ARTERY BYPASS GRAFTING (CABG) x 3 WITH ENDOSCOPIC HARVESTING OF RIGHT GREATER SAPHENOUS VEIN (N/A) TRANSESOPHAGEAL ECHOCARDIOGRAM (TEE) (N/A)  Total Length of Stay:  LOS: 3 days   Subjective: Patient awake alert neurologically intact, complains of not much appetite but no vomiting  Objective: Vital signs in last 24 hours: Temp:  [98.5 F (36.9 C)-98.7 F (37.1 C)] 98.7 F (37.1 C) (08/22 0755) Pulse Rate:  [62-87] 70 (08/22 0400) Cardiac Rhythm: Normal sinus rhythm (08/22 0730) Resp:  [12-20] 12 (08/22 0400) BP: (102-136)/(61-85) 130/80 (08/22 0812) SpO2:  [91 %-96 %] 94 % (08/22 0400) Weight:  [82.9 kg] 82.9 kg (08/22 0624)  Filed Weights   03/13/19 0500 03/14/19 0500 03/15/19 0624  Weight: 85.2 kg 84.7 kg 82.9 kg    Weight change: -1.8 kg   Hemodynamic parameters for last 24 hours:    Intake/Output from previous day: 08/21 0701 - 08/22 0700 In: 412.2 [P.O.:360; I.V.:52.2] Out: 1420 [Urine:1400; Chest Tube:20]  Intake/Output this shift: No intake/output data recorded.  Current Meds: Scheduled Meds: . acetaminophen  1,000 mg Oral Q6H  . aspirin EC  325 mg Oral Daily  . atorvastatin  10 mg Oral QHS  . bisacodyl  10 mg Oral Daily   Or  . bisacodyl  10 mg Rectal Daily  . Chlorhexidine Gluconate Cloth  6 each Topical Daily  . docusate sodium  200 mg Oral Daily  . enoxaparin (LOVENOX) injection  30 mg Subcutaneous QHS  . furosemide  40 mg Oral Daily  . insulin aspart  0-24 Units Subcutaneous TID AC & HS  . metoprolol tartrate  12.5 mg Oral BID  . multivitamin with minerals  1 tablet Oral Daily  . pantoprazole  40 mg Oral Daily  . potassium chloride  40 mEq Oral Daily   Continuous Infusions: . sodium chloride  Stopped (03/13/19 0937)  . sodium chloride 20 mL/hr at 03/12/19 1900  . lactated ringers     PRN Meds:.sodium chloride, lactated ringers, metoprolol tartrate, morphine injection, ondansetron (ZOFRAN) IV, oxyCODONE, traMADol  General appearance: alert and cooperative Neurologic: intact Heart: regular rate and rhythm, S1, S2 normal, no murmur, click, rub or gallop Lungs: diminished breath sounds bibasilar Abdomen: soft, non-tender; bowel sounds normal; no masses,  no organomegaly Extremities: extremities normal, atraumatic, no cyanosis or edema and Homans sign is negative, no sign of DVT Wound: Sternum stable dressing intact  Lab Results: CBC: Recent Labs    03/14/19 0450 03/15/19 0323  WBC 13.7* 10.3  HGB 10.0* 10.1*  HCT 28.9* 29.3*  PLT 123* 127*   BMET:  Recent Labs    03/14/19 0450 03/15/19 0323  NA 136 140  K 4.4 3.9  CL 106 109  CO2 24 24  GLUCOSE 118* 108*  BUN 14 11  CREATININE 1.07 0.97  CALCIUM 8.5* 8.2*    CMET: Lab Results  Component Value Date   WBC 10.3 03/15/2019   HGB 10.1 (L) 03/15/2019   HCT 29.3 (L) 03/15/2019   PLT 127 (L) 03/15/2019   GLUCOSE 108 (H) 03/15/2019   ALT 20 03/05/2019  AST 22 03/05/2019   NA 140 03/15/2019   K 3.9 03/15/2019   CL 109 03/15/2019   CREATININE 0.97 03/15/2019   BUN 11 03/15/2019   CO2 24 03/15/2019   TSH 3.520 02/13/2018   INR 1.5 (H) 03/12/2019   HGBA1C 5.1 03/05/2019      PT/INR:  Recent Labs    03/12/19 1329  LABPROT 17.7*  INR 1.5*   Radiology: No results found.   Assessment/Plan: S/P Procedure(s) (LRB): CORONARY ARTERY BYPASS GRAFTING (CABG) x 3 WITH ENDOSCOPIC HARVESTING OF RIGHT GREATER SAPHENOUS VEIN (N/A) TRANSESOPHAGEAL ECHOCARDIOGRAM (TEE) (N/A) Mobilize Diuresis Plan for transfer to step-down: see transfer orders     Jared Roberts 03/15/2019 9:04 AM

## 2019-03-15 NOTE — Progress Notes (Signed)
Pt ambulated x 940 feet around unit un assisted pt tolerated well

## 2019-03-16 MED ORDER — IRBESARTAN 150 MG PO TABS
75.0000 mg | ORAL_TABLET | Freq: Every day | ORAL | Status: DC
  Administered 2019-03-16 – 2019-03-17 (×2): 75 mg via ORAL
  Filled 2019-03-16 (×2): qty 1

## 2019-03-16 NOTE — Progress Notes (Addendum)
      [address]       [place] [address]             [phone]      4 Days Post-Op Procedure(s) (LRB): CORONARY ARTERY BYPASS GRAFTING (CABG) x 3 WITH ENDOSCOPIC HARVESTING OF RIGHT GREATER SAPHENOUS VEIN (N/A) TRANSESOPHAGEAL ECHOCARDIOGRAM (TEE) (N/A) Subjective: Feels good today. He has already walked. No complaints.   Objective: Vital signs in last 24 hours: Temp:  [98 F (36.7 C)-98.9 F (37.2 C)] 98.3 F (36.8 C) (08/23 0449) Pulse Rate:  [83-94] 86 (08/23 0449) Cardiac Rhythm: Normal sinus rhythm (08/23 0449) Resp:  [20-25] 20 (08/23 0449) BP: (111-152)/(80-98) 146/93 (08/23 0449) SpO2:  [96 %-98 %] 98 % (08/23 0449) Weight:  [80.1 kg] 80.1 kg (08/23 0449)     Intake/Output from previous day: 08/22 0701 - 08/23 0700 In: 340 [P.O.:340] Out: 1000 [Urine:1000] Intake/Output this shift: No intake/output data recorded.  General appearance: alert, cooperative and no distress Heart: sinus tachycardia Lungs: clear to auscultation bilaterally Abdomen: soft, non-tender; bowel sounds normal; no masses,  no organomegaly Extremities: extremities normal, atraumatic, no cyanosis or edema Wound: clean and dry  Lab Results: Recent Labs    03/14/19 0450 03/15/19 0323  WBC 13.7* 10.3  HGB 10.0* 10.1*  HCT 28.9* 29.3*  PLT 123* 127*   BMET:  Recent Labs    03/14/19 0450 03/15/19 0323  NA 136 140  K 4.4 3.9  CL 106 109  CO2 24 24  GLUCOSE 118* 108*  BUN 14 11  CREATININE 1.07 0.97  CALCIUM 8.5* 8.2*    PT/INR: No results for input(s): LABPROT, INR in the last 72 hours. ABG    Component Value Date/Time   PHART 7.360 03/12/2019 1937   HCO3 19.6 (L) 03/12/2019 1937   TCO2 19 (L) 03/12/2019 1956   ACIDBASEDEF 5.0 (H) 03/12/2019 1937   O2SAT 97.0 03/12/2019 1937   CBG (last 3)  Recent Labs    03/14/19 2153 03/15/19 0650 03/15/19 1133  GLUCAP 107* 113* 111*    Assessment/Plan: S/P Procedure(s) (LRB): CORONARY ARTERY BYPASS  GRAFTING (CABG) x 3 WITH ENDOSCOPIC HARVESTING OF RIGHT GREATER SAPHENOUS VEIN (N/A) TRANSESOPHAGEAL ECHOCARDIOGRAM (TEE) (N/A)  1. CV-NSR in the 80s, SBP 140s. Continue ASA, statin, and BB. Will start Benicar (was on at home) at a reduced dose.  2. Pulm-tolerating room air with good oxygen saturation. Continue incentive spirometer.CXR shows small bilateral pleural effusions and atelectasis.   3. Renal-creatinine 0.97, electrolytes okay. Continue daily lasix for fluid overload.  4. H and H  10.1/29.3, expected acute blood loss anemia. Continue to trend.  5. Endo-blood glucose has been well controlled. Continue current regimen.    Plan: EPW already removed. Continue to encourage incentive spirometer. Already ambulated 417ft in the call independently. Continue to titrate BP meds as able.  Will discharge tomorrow if remains stable.    LOS: 4 days    Jared Roberts 03/16/2019   Plan home tomorrow am I have seen and examined Jared Roberts and agree with the above assessment  and plan.  Grace Isaac MD Beeper [phone] Office [phone] 03/16/2019 12:58 PM

## 2019-03-16 NOTE — Progress Notes (Signed)
Pt ambulated around unit x 470 feet unassisted

## 2019-03-16 NOTE — Progress Notes (Signed)
Pt ambulated x 470 feet independently around unit

## 2019-03-17 LAB — ECHO INTRAOPERATIVE TEE
Height: 70 in
Weight: 2847.99 oz

## 2019-03-17 MED ORDER — ASPIRIN 325 MG PO TBEC: 325.0000 mg | DELAYED_RELEASE_TABLET | Freq: Every day | ORAL | 0 refills | Status: DC

## 2019-03-17 MED ORDER — OLMESARTAN MEDOXOMIL 20 MG PO TABS: 20.0000 mg | ORAL_TABLET | Freq: Every day | ORAL | 1 refills | Status: DC

## 2019-03-17 MED ORDER — ACETAMINOPHEN 500 MG PO TABS: 500.0000 mg | ORAL_TABLET | ORAL | 0 refills | Status: DC | PRN

## 2019-03-17 MED ORDER — POTASSIUM CHLORIDE CRYS ER 20 MEQ PO TBCR: 20.0000 meq | EXTENDED_RELEASE_TABLET | Freq: Every day | ORAL | 0 refills | Status: DC

## 2019-03-17 MED ORDER — FUROSEMIDE 40 MG PO TABS: 40.0000 mg | ORAL_TABLET | Freq: Every day | ORAL | 0 refills | Status: DC

## 2019-03-17 MED ORDER — METOPROLOL TARTRATE 25 MG PO TABS: 25.0000 mg | ORAL_TABLET | Freq: Two times a day (BID) | ORAL | 1 refills | Status: DC

## 2019-03-17 MED ORDER — METOPROLOL TARTRATE 25 MG PO TABS
25.0000 mg | ORAL_TABLET | Freq: Two times a day (BID) | ORAL | Status: DC
  Administered 2019-03-17: 25 mg via ORAL
  Filled 2019-03-17: qty 1

## 2019-03-17 MED ORDER — TRAMADOL HCL 50 MG PO TABS: 50.0000 mg | ORAL_TABLET | ORAL | 0 refills | Status: DC | PRN

## 2019-03-17 MED ORDER — POTASSIUM CHLORIDE CRYS ER 20 MEQ PO TBCR
20.0000 meq | EXTENDED_RELEASE_TABLET | Freq: Every day | ORAL | Status: DC
  Administered 2019-03-17: 20 meq via ORAL
  Filled 2019-03-17: qty 1

## 2019-03-17 MED FILL — Sodium Bicarbonate IV Soln 8.4%: INTRAVENOUS | Qty: 50 | Status: AC

## 2019-03-17 MED FILL — Lidocaine HCl Local Soln Prefilled Syringe 100 MG/5ML (2%): INTRAMUSCULAR | Qty: 5 | Status: AC

## 2019-03-17 MED FILL — Sodium Chloride IV Soln 0.9%: INTRAVENOUS | Qty: 2000 | Status: AC

## 2019-03-17 MED FILL — Calcium Chloride Inj 10%: INTRAVENOUS | Qty: 10 | Status: AC

## 2019-03-17 MED FILL — Mannitol IV Soln 20%: INTRAVENOUS | Qty: 500 | Status: AC

## 2019-03-17 MED FILL — Electrolyte-R (PH 7.4) Solution: INTRAVENOUS | Qty: 3000 | Status: AC

## 2019-03-17 NOTE — Progress Notes (Signed)
      [address]       [place] [address]             [phone]        5 Days Post-Op Procedure(s) (LRB): CORONARY ARTERY BYPASS GRAFTING (CABG) x 3 WITH ENDOSCOPIC HARVESTING OF RIGHT GREATER SAPHENOUS VEIN (N/A) TRANSESOPHAGEAL ECHOCARDIOGRAM (TEE) (N/A)  Subjective: Patient sitting in chair, about to eat breakfast. He has no complaints this am.  Objective: Vital signs in last 24 hours: Temp:  [98.2 F (36.8 C)-98.4 F (36.9 C)] 98.2 F (36.8 C) (08/24 0422) Pulse Rate:  [94-102] 94 (08/24 0422) Cardiac Rhythm: Normal sinus rhythm (08/24 0422) Resp:  [16-25] 16 (08/24 0422) BP: (125-147)/(84-98) 140/98 (08/24 0422) SpO2:  [97 %-98 %] 98 % (08/24 0422) Weight:  [78.8 kg] 78.8 kg (08/24 0422)  Pre op weight 80.7 kg Current Weight  03/17/19 78.8 kg       Intake/Output from previous day: 08/23 0701 - 08/24 0700 In: 720 [P.O.:720] Out: -    Physical Exam:  Cardiovascular: Slightly tachycardic Pulmonary: Clear to auscultation bilaterally Abdomen: Soft, non tender, bowel sounds present. Extremities: Trace lower extremity edema. Ecchymosis left forearm (secondary to a line) Wounds: Clean and dry.  No erythema or signs of infection.  Lab Results: CBC: Recent Labs    03/15/19 0323  WBC 10.3  HGB 10.1*  HCT 29.3*  PLT 127*   BMET:  Recent Labs    03/15/19 0323  NA 140  K 3.9  CL 109  CO2 24  GLUCOSE 108*  BUN 11  CREATININE 0.97  CALCIUM 8.2*    PT/INR:  Lab Results  Component Value Date   INR 1.5 (H) 03/12/2019   INR 1.0 03/05/2019   ABG:  INR: Will add last result for INR, ABG once components are confirmed Will add last 4 CBG results once components are confirmed  Assessment/Plan: 1. CV-Slightly tachycardic this am with HR low 100's. On Lopressor 12.5 mg bid and Irbesartan 75 mg daily. Will increase Lopressor to 25 mg bid. 2. Pulmonary-On room air. Encourage incentive spirometer. 3. Expected ABL anemia-H and H this  am 10.1 and 29.3 4. Volume overload-on Lasix 40 mg daily 5. Mild thrombocytopenia-last platelets 127,000  6. Discharge   Donielle M ZimmermanPA-C 03/17/2019,7:11 AM [phone]

## 2019-03-17 NOTE — Progress Notes (Signed)
CARDIAC REHAB PHASE I   Pt ambulating independently in hallway without difficulty. Pt educated on importance of showers and monitoring incisions daily. Encouraged continued IS use, walks, and sternal precautions. Pt given in-the-tube sheet and heart healthy diet. Reviewed restrictions and exercise guidelines. Will refer to CRP II [place], pt interested in Virtual Cardiac Rehab.  Pt is interested in participating in Virtual Cardiac Rehab. Pt advised that Virtual Cardiac Rehab is provided at no cost to the patient.  Checklist:  1. Pt has smart device  ie smartphone and/or ipad for downloading an app  Yes 2. Reliable internet/wifi service    Yes 3. Understands how to use their smartphone and navigate within an app.  Yes  Reviewed with pt the scheduling process for virtual cardiac rehab.  Pt verbalized understanding.  [id number] Rufina Falco, RN BSN 03/17/2019 8:54 AM

## 2019-03-17 NOTE — Progress Notes (Signed)
Pt ambulated around the unit x 470 feet pt tolerated well 

## 2019-03-17 NOTE — Progress Notes (Signed)
Discharge AVS meds take and those due reviewed with pt. Follow up appointments and when to call MD reviewed. All questions and concerns addressed. No further questions at this time. D/c IV and TELE, CCMD notified. D/C home per orders. Brought down via wheelchair with RN. Amanda Cockayne, RN

## 2019-03-19 ENCOUNTER — Telehealth: Payer: Self-pay

## 2019-03-19 NOTE — Telephone Encounter (Signed)
Patient contacted the office with concerns about his medications and not being able to sleep at night.  He stated that he is not in any pain, but has trouble falling asleep. I advised patient he could take over the counter melatonin up to 10 mg or Benadryl for sleep at night.  He acknowledged receipt.  Patient also stated that his heart rate was staying in the 90's at night because he felt like his Metoprolol was wearing off.  Patient stated that he was only taking it once a day because his wife thought she heard the nurse going over discharge instructions say that he only needed to take it in the morning.  I advised that he should be taking it twice a day, once in the morning, and once in the evening with dinner.  He acknowledged receipt.  Patient is aware of his follow-up appointment with Dr. Kipp Brood Friday.  Patient acknowledged receipt.

## 2019-03-20 ENCOUNTER — Telehealth (HOSPITAL_COMMUNITY): Payer: Self-pay

## 2019-03-20 NOTE — Telephone Encounter (Signed)
Called and spoke with pt in regards to CR, pt stated he does not wish to come in to participate due to social distancing. But he would like to participate in our VCR program.

## 2019-03-20 NOTE — Telephone Encounter (Signed)
Pt insurance is active and benefits verified through [employer]. Co-pay $45.00, DED $0.00/$0.00 met, out of pocket $4,200.00/$725.95 met, co-insurance 0%. No pre-authorization required. Ana/[employer], 03/20/2019 @ 235PM, REF#[id number] ° °Will contact patient to see if he is interested in the Cardiac Rehab Program. If interested, patient will need to complete follow up appt. Once completed, patient will be contacted for scheduling upon review by the RN Navigator. °

## 2019-03-21 ENCOUNTER — Ambulatory Visit (INDEPENDENT_AMBULATORY_CARE_PROVIDER_SITE_OTHER): Payer: Self-pay | Admitting: Thoracic Surgery (Cardiothoracic Vascular Surgery)

## 2019-03-21 ENCOUNTER — Encounter: Payer: Self-pay | Admitting: Thoracic Surgery (Cardiothoracic Vascular Surgery)

## 2019-03-21 ENCOUNTER — Other Ambulatory Visit: Payer: Self-pay

## 2019-03-21 VITALS — BP 81/56 | HR 102 | Temp 96.6°F | Resp 18 | Ht 70.0 in | Wt 168.8 lb

## 2019-03-21 DIAGNOSIS — Z951 Presence of aortocoronary bypass graft: Secondary | ICD-10-CM

## 2019-03-21 NOTE — Progress Notes (Signed)
      [address]       [place] [address]             [phone]        Lakshya C [facility] Medical Record [id number] Date of Birth: 1945/08/17  Referring: Nahser, Wonda Cheng, MD Primary Care: Deland Pretty, MD Primary Cardiologist:Philip Nahser, MD  Reason for visit:   follow-up  History of Present Illness:     73 yo male s/p CABG.  Presents for his 1 week follow-up.  Overall feels well, but is easily fatigued.  Poor sleep, but good appetite  Physical Exam: BP (!) 81/56 (BP Location: Left Arm, Patient Position: Sitting, Cuff Size: Normal)   Pulse (!) 102   Temp (!) 96.6 F (35.9 C)   Resp 18   Ht 5\' 10"  (1.778 m)   Wt 168 lb 12.8 oz (76.6 kg)   SpO2 97% Comment: RA  BMI 24.22 kg/m   Alert NAD Mildly tach, regular no murmur.  Incision clean.  Sternum stabl Abdomen soft, NT/ND no peripheral edema   Diagnostic Studies & Laboratory data:     Assessment / Plan:   S/p CABG.  Doing well.   -Soft blood pressure.  Will stop lasix, and increase hydration.  Will check BP at home.  Scheduled to f/u w/ cardiology next week - will f/u in 1 month w/cxr   [name] 03/21/2019 11:05 AM

## 2019-03-24 ENCOUNTER — Telehealth: Payer: Self-pay | Admitting: Cardiovascular Disease

## 2019-03-24 NOTE — Telephone Encounter (Signed)
Replied to patient through [facility] that I have documented in the appointment notes that his wife can accompany him to his visit.

## 2019-03-24 NOTE — Telephone Encounter (Signed)
New Message:    Pt wanted you to know that his wife will be coming in with him for his appointment on 03-26-19. He said you can  respond to him on My Chart.

## 2019-03-26 ENCOUNTER — Ambulatory Visit (INDEPENDENT_AMBULATORY_CARE_PROVIDER_SITE_OTHER): Payer: Medicare HMO | Admitting: Cardiovascular Disease

## 2019-03-26 ENCOUNTER — Encounter: Payer: Self-pay | Admitting: Cardiovascular Disease

## 2019-03-26 ENCOUNTER — Other Ambulatory Visit: Payer: Self-pay

## 2019-03-26 VITALS — BP 102/62 | HR 105 | Ht 70.0 in | Wt 172.0 lb

## 2019-03-26 DIAGNOSIS — I2510 Atherosclerotic heart disease of native coronary artery without angina pectoris: Secondary | ICD-10-CM

## 2019-03-26 MED ORDER — METOPROLOL TARTRATE 50 MG PO TABS: 50.0000 mg | ORAL_TABLET | Freq: Two times a day (BID) | ORAL | 3 refills | Status: DC

## 2019-03-26 NOTE — Patient Instructions (Signed)
Medication Instructions:  Your physician has recommended you make the following change in your medication:  1.) increase metoprolol tartrate to 50 mg twice a day  If you need a refill on your cardiac medications before your next appointment, please call your pharmacy.   Lab work: none If you have labs (blood work) drawn today and your tests are completely normal, you will receive your results only by: [facility] MyChart Message (if you have MyChart) OR . A paper copy in the mail If you have any lab test that is abnormal or we need to change your treatment, we will call you to review the results.  Testing/Procedures: none  Follow-Up:as scheduled  Any Other Special Instructions Will Be Listed Below (If Applicable).

## 2019-03-26 NOTE — [place] Notes (Signed)
Cardiology Office Note:    Date:  03/26/2019   ID:  Jared Roberts, DOB 03-25-1946, MRN [id number]  PCP:  Deland Pretty, MD  Cardiologist:  Renea Schoonmaker  Electrophysiologist:  None   Referring MD: Deland Pretty, MD   No chief complaint on file.   January 31, 2019    Jared Roberts is a 73 y.o. male with a hx of  HTN, TIA and carotid stenosis .  Has been having some chest pain  A week ago, mowed the lawn,  Had CP and profound fatigue. Upper left chest ,  Left shoulder and arm,  Left jaw.   Comes and goes ,  Last for seconds - minutes ,   Resolves if he stops to rest.  Associated with dyspnea but also has dyspnea on its own.  Doesn't exercise,  Works in the yard . Can climb 2 flights of stairs - makes him breath hard.   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  Non smoker, Drinks 1 drink per night   March 26, 2019:  Jared Roberts is seen back today for a follow-up office visit.  He recently had a heart catheterization which revealed severe coronary artery disease and he had coronary artery bypass grafting. BP has been variable Lower in the am Higher in the PM Cannot tell when his BP is high or low Chest is sore but no angina  Exercising some - walking around the house .   Has a referral for cardiac rehab   Past Medical History:  Diagnosis Date   Chest discomfort    Coronary artery disease    DDD (degenerative disc disease), cervical    Depression    Diarrhea    Dizziness    DOE (dyspnea on exertion)    ED (erectile dysfunction) of organic origin    Fatigue    Generalized osteoarthritis    GERD (gastroesophageal reflux disease)    Hiatal hernia    History of chronic bronchitis    History of gastritis    [id number]   History of Helicobacter pylori infection    2003   History of kidney stones    History of melanoma excision    2015--  right ear   History of prostate cancer urologist-  dr [name]   2007--  pT2a Nx Mx,  Gleason 3+3, S/P   PROSTATECTOMY   History of squamous cell carcinoma excision    2011  left knee   Hx of headache    Hx of hemorrhoids    Hyperlipidemia    Hyperplastic colon polyp 2009   Hypertension    Male hypogonadism    Melanoma ([place])    left 4th toe   Nephrolithiasis    Palpitations    Prostate cancer ([place])    Rectal bleeding    Shoulder pain, left    Skin cancer    Tinnitus    Wears glasses     Past Surgical History:  Procedure Laterality Date   AMPUTATION TOE Left 09/07/2015   Procedure: PARTIAL AMPUTATION TOE 4TH LEFT;  Surgeon: [name], MD;  Location: [facility];  Service: Podiatry;  Laterality: Left;   APPENDECTOMY  1970's   CORONARY ARTERY BYPASS GRAFT N/A 03/12/2019   Procedure: CORONARY ARTERY BYPASS GRAFTING (CABG) x 3 WITH ENDOSCOPIC HARVESTING OF RIGHT GREATER SAPHENOUS VEIN;  Surgeon: [name], MD;  Location: [place] [facility];  Service: Open Heart Surgery;  Laterality: N/A;   CYSTO/  CLOT EVACUATION POST  PROSTATECTOMY  11-19-2005   CYSTO/  REMOVAL URETHRAL STONE AND URETHRAL STAPLES  06-02-2010   CYSTO/  URETEROSCOPIC STONE EXTRACTION  X2  1990's   KIDNEY STONE SURGERY     LEFT HEART CATH AND CORONARY ANGIOGRAPHY N/A 02/25/2019   Procedure: LEFT HEART CATH AND CORONARY ANGIOGRAPHY;  Surgeon: [name], MD;  Location: [facility];  Service: Cardiovascular;  Laterality: N/A;   MELANOMA EXCISION  2015   right ear   ROBOT ASSISTED LAPAROSCOPIC RADICAL PROSTATECTOMY  11-09-2005   SKIN CANCER EXCISION     TEE WITHOUT CARDIOVERSION N/A 03/12/2019   Procedure: TRANSESOPHAGEAL ECHOCARDIOGRAM (TEE);  Surgeon: [name], MD;  Location: [facility];  Service: Open Heart Surgery;  Laterality: N/A;   TONSILLECTOMY  child    Current Medications: Current Meds  Medication Sig   acetaminophen (TYLENOL) 500 MG tablet Take 1 tablet (500 mg total) by mouth every 4 (four) hours as needed for mild pain.   ascorbic acid (VITAMIN  C) 1000 MG tablet Take 1,000 mg by mouth daily.   aspirin EC 325 MG EC tablet Take 1 tablet (325 mg total) by mouth daily.   atorvastatin (LIPITOR) 10 MG tablet Take 10 mg by mouth at bedtime.    calcium carbonate (TUMS - DOSED IN MG ELEMENTAL CALCIUM) 500 MG chewable tablet Chew 1-2 tablets by mouth 3 (three) times daily as needed for indigestion or heartburn.    Cholecalciferol (VITAMIN D) 2000 units CAPS Take 2,000 Units by mouth 2 (two) times a day.    ibuprofen (ADVIL) 200 MG tablet Take 400 mg by mouth every 6 (six) hours as needed.   Multiple Vitamin (MULTIVITAMIN WITH MINERALS) TABS tablet Take 1 tablet by mouth daily.   olmesartan (BENICAR) 20 MG tablet Take 1 tablet (20 mg total) by mouth daily.   Probiotic Product (PROBIOTIC DAILY PO) Take 1 capsule by mouth daily.    traMADol (ULTRAM) 50 MG tablet Take 1 tablet (50 mg total) by mouth every 4 (four) hours as needed for moderate pain.   [DISCONTINUED] metoprolol tartrate (LOPRESSOR) 25 MG tablet Take 1 tablet (25 mg total) by mouth 2 (two) times daily.     Allergies:   Patient has no known allergies.   Social History   Socioeconomic History   Marital status: Married    Spouse name: Not on file   Number of children: Not on file   Years of education: Not on file   Highest education level: Not on file  Occupational History   Not on file  Social Needs   Financial resource strain: Not on file   Food insecurity    Worry: Not on file    Inability: Not on file   Transportation needs    Medical: Not on file    Non-medical: Not on file  Tobacco Use   Smoking status: Never Smoker   Smokeless tobacco: Never Used  Substance and Sexual Activity   Alcohol use: Yes    Comment: 1 drink daily   Drug use: No   Sexual activity: Not on file    Comment: retired, married. 1 son  Lifestyle   Physical activity    Days per week: Not on file    Minutes per session: Not on file   Stress: Not on file    Relationships   Social connections    Talks on phone: Not on file    Gets together: Not on file    Attends religious service: Not on file  Active member of club or organization: Not on file    Attends meetings of clubs or organizations: Not on file    Relationship status: Not on file  Other Topics Concern   Not on file  Social History Narrative   Not on file     Family History: The patient's family history includes Arthritis in his mother; Congestive Heart Failure in his mother; Diabetes Mellitus II in his mother; Osteoarthritis in his mother; Other in his father.  ROS:   Please see the history of present illness.     All other systems reviewed and are negative.  EKGs/Labs/Other Studies Reviewed:    The following studies were reviewed today:   EKG:  January 27, 2019 at Dr. Denita Lung office : Normal sinus rhythm at 91 beats a minute.  No ST or T wave changes.  Recent Labs: 03/05/2019: ALT 20 03/13/2019: Magnesium 2.4 03/15/2019: BUN 11; Creatinine, Ser 0.97; Hemoglobin 10.1; Platelets 127; Potassium 3.9; Sodium 140  Recent Lipid Panel No results found for: CHOL, TRIG, HDL, CHOLHDL, VLDL, LDLCALC, LDLDIRECT  Physical Exam:    Physical Exam: Blood pressure 102/62, pulse (!) 105, height 5\' 10"  (1.778 m), weight 172 lb (78 kg), SpO2 96 %.  GEN:  Well nourished, well developed in no acute distress HEENT: Normal NECK: No JVD; No carotid bruits LYMPHATICS: No lymphadenopathy CARDIAC: RRR , his sternotomy scar is healing well.  His thoracostomy sites have healed nicely. RESPIRATORY:  Clear to auscultation without rales, wheezing or rhonchi  ABDOMEN: Soft, non-tender, non-distended MUSCULOSKELETAL:  No edema; the saphenous vein harvest sites are mildly tender.  They appear to be healing normally.  There is no evidence of significant phlebitis or inflammation. SKIN: Warm and dry NEUROLOGIC:  Alert and oriented x 3   ASSESSMENT:    No diagnosis found. PLAN:    In order of  problems listed above:  1. CAD : He is doing well after coronary artery bypass grafting.  His heart rate remains mildly tachycardic.  We will increase the metoprolol to 50 mg twice a day.  We might have to decrease his benazepril if his blood pressure drops with this.  Have instructed him to keep a blood pressure log.  He will keep [place] informed via the MyChart portal.  He is going to be participating in cardiac rehab from a remote app.  2.  HTN: Blood pressure is well controlled.  3.  Hyperlipidemia: Check lipids in several months.     Medication Adjustments/Labs and Tests Ordered: Current medicines are reviewed at length with the patient today.  Concerns regarding medicines are outlined above.  No orders of the defined types were placed in this encounter.  Meds ordered this encounter  Medications   metoprolol tartrate (LOPRESSOR) 50 MG tablet    Sig: Take 1 tablet (50 mg total) by mouth 2 (two) times daily.    Dispense:  180 tablet    Refill:  3    Dose increase     Patient Instructions  Medication Instructions:  Your physician has recommended you make the following change in your medication:  1.) increase metoprolol tartrate to 50 mg twice a day  If you need a refill on your cardiac medications before your next appointment, please call your pharmacy.   Lab work: none If you have labs (blood work) drawn today and your tests are completely normal, you will receive your results only by:  [place] (if you have MyChart) OR  A paper copy in the mail  If you have any lab test that is abnormal or we need to change your treatment, we will call you to review the results.  Testing/Procedures: none  Follow-Up:as scheduled  Any Other Special Instructions Will Be Listed Below (If Applicable).       Signed, Mertie Moores, MD  03/26/2019 2:20 PM    [place]

## 2019-03-28 ENCOUNTER — Encounter (HOSPITAL_COMMUNITY)
Admission: RE | Admit: 2019-03-28 | Discharge: 2019-03-28 | Disposition: A | Discharge status: Home / Self Care | Payer: Medicare HMO | Source: Ambulatory Visit | Attending: Cardiovascular Disease | Admitting: Cardiovascular Disease

## 2019-03-28 ENCOUNTER — Telehealth (HOSPITAL_COMMUNITY): Payer: Self-pay

## 2019-03-28 ENCOUNTER — Other Ambulatory Visit: Payer: Self-pay

## 2019-03-28 ENCOUNTER — Encounter (HOSPITAL_COMMUNITY): Payer: Self-pay

## 2019-03-28 NOTE — Telephone Encounter (Signed)
Pt returned CR phone call and was able to complete sign up for VCR.

## 2019-03-28 NOTE — Telephone Encounter (Signed)
Attempted to call patient in regards to Cardiac Rehab - LM on VM Mailed letter 

## 2019-03-28 NOTE — Telephone Encounter (Signed)

## 2019-04-02 DIAGNOSIS — R062 Wheezing: Secondary | ICD-10-CM | POA: Diagnosis not present

## 2019-04-02 DIAGNOSIS — R0989 Other specified symptoms and signs involving the circulatory and respiratory systems: Secondary | ICD-10-CM | POA: Diagnosis not present

## 2019-04-07 ENCOUNTER — Inpatient Hospital Stay (HOSPITAL_COMMUNITY)
Admission: EM | Admit: 2019-04-07 | Discharge: 2019-04-09 | DRG: 250 | Disposition: A | Discharge status: Home / Self Care | Payer: Medicare HMO | Attending: Cardiovascular Disease | Admitting: Cardiovascular Disease

## 2019-04-07 ENCOUNTER — Inpatient Hospital Stay (HOSPITAL_COMMUNITY): Payer: Medicare HMO

## 2019-04-07 ENCOUNTER — Encounter (HOSPITAL_COMMUNITY)
Admission: EM | Disposition: A | Discharge status: Home / Self Care | Payer: Self-pay | Source: Home / Self Care | Attending: Cardiovascular Disease

## 2019-04-07 ENCOUNTER — Encounter (HOSPITAL_COMMUNITY): Payer: Self-pay | Admitting: Emergency Medicine

## 2019-04-07 DIAGNOSIS — Z85820 Personal history of malignant melanoma of skin: Secondary | ICD-10-CM

## 2019-04-07 DIAGNOSIS — I249 Acute ischemic heart disease, unspecified: Secondary | ICD-10-CM | POA: Diagnosis not present

## 2019-04-07 DIAGNOSIS — I119 Hypertensive heart disease without heart failure: Secondary | ICD-10-CM | POA: Diagnosis present

## 2019-04-07 DIAGNOSIS — F329 Major depressive disorder, single episode, unspecified: Secondary | ICD-10-CM | POA: Diagnosis present

## 2019-04-07 DIAGNOSIS — K649 Unspecified hemorrhoids: Secondary | ICD-10-CM | POA: Diagnosis present

## 2019-04-07 DIAGNOSIS — Z87442 Personal history of urinary calculi: Secondary | ICD-10-CM | POA: Diagnosis not present

## 2019-04-07 DIAGNOSIS — R52 Pain, unspecified: Secondary | ICD-10-CM

## 2019-04-07 DIAGNOSIS — I4901 Ventricular fibrillation: Secondary | ICD-10-CM | POA: Diagnosis not present

## 2019-04-07 DIAGNOSIS — I25719 Atherosclerosis of autologous vein coronary artery bypass graft(s) with unspecified angina pectoris: Secondary | ICD-10-CM | POA: Diagnosis not present

## 2019-04-07 DIAGNOSIS — Z8719 Personal history of other diseases of the digestive system: Secondary | ICD-10-CM | POA: Diagnosis not present

## 2019-04-07 DIAGNOSIS — Z79899 Other long term (current) drug therapy: Secondary | ICD-10-CM | POA: Diagnosis not present

## 2019-04-07 DIAGNOSIS — M159 Polyosteoarthritis, unspecified: Secondary | ICD-10-CM | POA: Diagnosis present

## 2019-04-07 DIAGNOSIS — I2584 Coronary atherosclerosis due to calcified coronary lesion: Secondary | ICD-10-CM | POA: Diagnosis not present

## 2019-04-07 DIAGNOSIS — Z8249 Family history of ischemic heart disease and other diseases of the circulatory system: Secondary | ICD-10-CM

## 2019-04-07 DIAGNOSIS — N529 Male erectile dysfunction, unspecified: Secondary | ICD-10-CM | POA: Diagnosis present

## 2019-04-07 DIAGNOSIS — M545 Low back pain: Secondary | ICD-10-CM | POA: Diagnosis not present

## 2019-04-07 DIAGNOSIS — Z8546 Personal history of malignant neoplasm of prostate: Secondary | ICD-10-CM | POA: Diagnosis not present

## 2019-04-07 DIAGNOSIS — M5030 Other cervical disc degeneration, unspecified cervical region: Secondary | ICD-10-CM | POA: Diagnosis not present

## 2019-04-07 DIAGNOSIS — R079 Chest pain, unspecified: Secondary | ICD-10-CM | POA: Diagnosis not present

## 2019-04-07 DIAGNOSIS — I25119 Atherosclerotic heart disease of native coronary artery with unspecified angina pectoris: Secondary | ICD-10-CM | POA: Diagnosis not present

## 2019-04-07 DIAGNOSIS — Z20828 Contact with and (suspected) exposure to other viral communicable diseases: Secondary | ICD-10-CM | POA: Diagnosis present

## 2019-04-07 DIAGNOSIS — R4020 Unspecified coma: Secondary | ICD-10-CM | POA: Diagnosis not present

## 2019-04-07 DIAGNOSIS — Z7982 Long term (current) use of aspirin: Secondary | ICD-10-CM

## 2019-04-07 DIAGNOSIS — I6529 Occlusion and stenosis of unspecified carotid artery: Secondary | ICD-10-CM | POA: Diagnosis present

## 2019-04-07 DIAGNOSIS — I10 Essential (primary) hypertension: Secondary | ICD-10-CM

## 2019-04-07 DIAGNOSIS — I214 Non-ST elevation (NSTEMI) myocardial infarction: Secondary | ICD-10-CM

## 2019-04-07 DIAGNOSIS — Z8673 Personal history of transient ischemic attack (TIA), and cerebral infarction without residual deficits: Secondary | ICD-10-CM

## 2019-04-07 DIAGNOSIS — I2510 Atherosclerotic heart disease of native coronary artery without angina pectoris: Secondary | ICD-10-CM

## 2019-04-07 DIAGNOSIS — G4700 Insomnia, unspecified: Secondary | ICD-10-CM | POA: Diagnosis present

## 2019-04-07 DIAGNOSIS — E781 Pure hyperglyceridemia: Secondary | ICD-10-CM | POA: Diagnosis not present

## 2019-04-07 DIAGNOSIS — E785 Hyperlipidemia, unspecified: Secondary | ICD-10-CM | POA: Diagnosis present

## 2019-04-07 DIAGNOSIS — R0689 Other abnormalities of breathing: Secondary | ICD-10-CM | POA: Diagnosis not present

## 2019-04-07 DIAGNOSIS — Z833 Family history of diabetes mellitus: Secondary | ICD-10-CM

## 2019-04-07 DIAGNOSIS — R404 Transient alteration of awareness: Secondary | ICD-10-CM | POA: Diagnosis not present

## 2019-04-07 DIAGNOSIS — K219 Gastro-esophageal reflux disease without esophagitis: Secondary | ICD-10-CM | POA: Diagnosis present

## 2019-04-07 DIAGNOSIS — Z8261 Family history of arthritis: Secondary | ICD-10-CM | POA: Diagnosis not present

## 2019-04-07 HISTORY — DX: Ventricular fibrillation: I49.01

## 2019-04-07 HISTORY — PX: LEFT HEART CATH AND CORS/GRAFTS ANGIOGRAPHY: CATH118250

## 2019-04-07 HISTORY — PX: CORONARY BALLOON ANGIOPLASTY: CATH118233

## 2019-04-07 LAB — POCT ACTIVATED CLOTTING TIME
Activated Clotting Time: 208 seconds
Activated Clotting Time: 263 seconds

## 2019-04-07 LAB — CBC
HCT: 40.3 % (ref 39.0–52.0)
Hemoglobin: 13.7 g/dL (ref 13.0–17.0)
MCH: 31.6 pg (ref 26.0–34.0)
MCHC: 34 g/dL (ref 30.0–36.0)
MCV: 92.9 fL (ref 80.0–100.0)
Platelets: 245 10*3/uL (ref 150–400)
RBC: 4.34 MIL/uL (ref 4.22–5.81)
RDW: 12.4 % (ref 11.5–15.5)
WBC: 14 10*3/uL — ABNORMAL HIGH (ref 4.0–10.5)
nRBC: 0.5 % — ABNORMAL HIGH (ref 0.0–0.2)

## 2019-04-07 LAB — TYPE AND SCREEN
ABO/RH(D): A POS
Antibody Screen: NEGATIVE

## 2019-04-07 LAB — BASIC METABOLIC PANEL
Anion gap: 13 (ref 5–15)
BUN: 15 mg/dL (ref 8–23)
CO2: 16 mmol/L — ABNORMAL LOW (ref 22–32)
Calcium: 9.1 mg/dL (ref 8.9–10.3)
Chloride: 109 mmol/L (ref 98–111)
Creatinine, Ser: 1.24 mg/dL (ref 0.61–1.24)
GFR calc Af Amer: >60 mL/min (ref >60)
GFR calc non Af Amer: 57 mL/min — ABNORMAL LOW (ref >60)
Glucose, Bld: 169 mg/dL — ABNORMAL HIGH (ref 70–99)
Potassium: 3.5 mmol/L (ref 3.5–5.1)
Sodium: 138 mmol/L (ref 135–145)

## 2019-04-07 LAB — I-STAT CHEM 8, ED
BUN: 16 mg/dL (ref 8–23)
Calcium, Ion: 1.18 mmol/L (ref 1.15–1.40)
Chloride: 110 mmol/L (ref 98–111)
Creatinine, Ser: 1.1 mg/dL (ref 0.61–1.24)
Glucose, Bld: 164 mg/dL — ABNORMAL HIGH (ref 70–99)
HCT: 39 % (ref 39.0–52.0)
Hemoglobin: 13.3 g/dL (ref 13.0–17.0)
Potassium: 3.5 mmol/L (ref 3.5–5.1)
Sodium: 140 mmol/L (ref 135–145)
TCO2: 18 mmol/L — ABNORMAL LOW (ref 22–32)

## 2019-04-07 LAB — ECHOCARDIOGRAM COMPLETE
Height: 70 in
Weight: 2751.34 oz

## 2019-04-07 LAB — I-STAT BETA HCG BLOOD, ED (MC, WL, AP ONLY): I-stat hCG, quantitative: <5 m[IU]/mL (ref <5)

## 2019-04-07 LAB — MAGNESIUM: Magnesium: 2.2 mg/dL (ref 1.7–2.4)

## 2019-04-07 LAB — TROPONIN I (HIGH SENSITIVITY)
Troponin I (High Sensitivity): 20 ng/L — ABNORMAL HIGH (ref <18)
Troponin I (High Sensitivity): 4481 ng/L (ref <18)

## 2019-04-07 LAB — SARS CORONAVIRUS 2 BY RT PCR (HOSPITAL ORDER, PERFORMED IN ~~LOC~~ HOSPITAL LAB): SARS Coronavirus 2: NEGATIVE

## 2019-04-07 SURGERY — LEFT HEART CATH AND CORS/GRAFTS ANGIOGRAPHY
Anesthesia: LOCAL

## 2019-04-07 MED ORDER — TICAGRELOR 90 MG PO TABS
90.0000 mg | ORAL_TABLET | Freq: Two times a day (BID) | ORAL | Status: DC
  Administered 2019-04-07 – 2019-04-09 (×4): 90 mg via ORAL
  Filled 2019-04-07 (×4): qty 1

## 2019-04-07 MED ORDER — LIDOCAINE HCL (PF) 1 % IJ SOLN
INTRAMUSCULAR | Status: AC
  Filled 2019-04-07: qty 30

## 2019-04-07 MED ORDER — POTASSIUM CHLORIDE CRYS ER 20 MEQ PO TBCR
40.0000 meq | EXTENDED_RELEASE_TABLET | Freq: Once | ORAL | Status: AC
  Administered 2019-04-07: 40 meq via ORAL
  Filled 2019-04-07: qty 2

## 2019-04-07 MED ORDER — HEPARIN SODIUM (PORCINE) 1000 UNIT/ML IJ SOLN
INTRAMUSCULAR | Status: DC | PRN
  Administered 2019-04-07 (×2): 4000 [IU] via INTRAVENOUS

## 2019-04-07 MED ORDER — HEPARIN (PORCINE) IN NACL 1000-0.9 UT/500ML-% IV SOLN
INTRAVENOUS | Status: AC
  Filled 2019-04-07: qty 1000

## 2019-04-07 MED ORDER — HEPARIN SODIUM (PORCINE) 1000 UNIT/ML IJ SOLN
INTRAMUSCULAR | Status: AC
  Filled 2019-04-07: qty 1

## 2019-04-07 MED ORDER — METOPROLOL TARTRATE 50 MG PO TABS
50.0000 mg | ORAL_TABLET | Freq: Two times a day (BID) | ORAL | Status: DC
  Administered 2019-04-07 – 2019-04-09 (×4): 50 mg via ORAL
  Filled 2019-04-07 (×5): qty 1

## 2019-04-07 MED ORDER — MIDAZOLAM HCL 2 MG/2ML IJ SOLN
INTRAMUSCULAR | Status: AC
  Filled 2019-04-07: qty 2

## 2019-04-07 MED ORDER — ASPIRIN EC 81 MG PO TBEC
81.0000 mg | DELAYED_RELEASE_TABLET | Freq: Every day | ORAL | Status: DC
  Administered 2019-04-07 – 2019-04-09 (×3): 81 mg via ORAL
  Filled 2019-04-07 (×3): qty 1

## 2019-04-07 MED ORDER — ENOXAPARIN SODIUM 40 MG/0.4ML ~~LOC~~ SOLN
40.0000 mg | SUBCUTANEOUS | Status: DC
  Administered 2019-04-08 – 2019-04-09 (×2): 40 mg via SUBCUTANEOUS
  Filled 2019-04-07 (×2): qty 0.4

## 2019-04-07 MED ORDER — SODIUM CHLORIDE 0.9% FLUSH
3.0000 mL | Freq: Two times a day (BID) | INTRAVENOUS | Status: DC
  Administered 2019-04-07 – 2019-04-09 (×4): 3 mL via INTRAVENOUS

## 2019-04-07 MED ORDER — FENTANYL CITRATE (PF) 100 MCG/2ML IJ SOLN
50.0000 ug | Freq: Once | INTRAMUSCULAR | Status: AC
  Administered 2019-04-07: 50 ug via INTRAVENOUS
  Filled 2019-04-07: qty 2

## 2019-04-07 MED ORDER — SODIUM CHLORIDE 0.9% FLUSH: 3.0000 mL | INTRAVENOUS | Status: DC | PRN

## 2019-04-07 MED ORDER — HEPARIN (PORCINE) IN NACL 1000-0.9 UT/500ML-% IV SOLN
INTRAVENOUS | Status: DC | PRN
  Administered 2019-04-07 (×2): 500 mL

## 2019-04-07 MED ORDER — LABETALOL HCL 5 MG/ML IV SOLN: 10.0000 mg | INTRAVENOUS | Status: AC | PRN

## 2019-04-07 MED ORDER — TRAMADOL HCL 50 MG PO TABS
50.0000 mg | ORAL_TABLET | ORAL | Status: DC | PRN
  Administered 2019-04-07 – 2019-04-08 (×3): 50 mg via ORAL
  Filled 2019-04-07 (×4): qty 1

## 2019-04-07 MED ORDER — SODIUM CHLORIDE 0.9% FLUSH: 3.0000 mL | Freq: Once | INTRAVENOUS | Status: DC

## 2019-04-07 MED ORDER — VERAPAMIL HCL 2.5 MG/ML IV SOLN
INTRAVENOUS | Status: DC | PRN
  Administered 2019-04-07: 10 mL via INTRA_ARTERIAL

## 2019-04-07 MED ORDER — HEPARIN BOLUS VIA INFUSION
4000.0000 [IU] | Freq: Once | INTRAVENOUS | Status: DC
  Filled 2019-04-07: qty 4000

## 2019-04-07 MED ORDER — IRBESARTAN 150 MG PO TABS
150.0000 mg | ORAL_TABLET | Freq: Every day | ORAL | Status: DC
  Administered 2019-04-07 – 2019-04-09 (×3): 150 mg via ORAL
  Filled 2019-04-07 (×3): qty 1

## 2019-04-07 MED ORDER — ATORVASTATIN CALCIUM 10 MG PO TABS
10.0000 mg | ORAL_TABLET | Freq: Every day | ORAL | Status: DC
  Administered 2019-04-07 – 2019-04-08 (×2): 10 mg via ORAL
  Filled 2019-04-07 (×2): qty 1

## 2019-04-07 MED ORDER — NITROGLYCERIN 1 MG/10 ML FOR IR/CATH LAB
INTRA_ARTERIAL | Status: AC
  Filled 2019-04-07: qty 10

## 2019-04-07 MED ORDER — TICAGRELOR 90 MG PO TABS
ORAL_TABLET | ORAL | Status: DC | PRN
  Administered 2019-04-07: 180 mg via ORAL

## 2019-04-07 MED ORDER — NITROGLYCERIN 0.4 MG SL SUBL: 0.4000 mg | SUBLINGUAL_TABLET | SUBLINGUAL | Status: DC | PRN

## 2019-04-07 MED ORDER — TICAGRELOR 90 MG PO TABS
ORAL_TABLET | ORAL | Status: AC
  Filled 2019-04-07: qty 2

## 2019-04-07 MED ORDER — HYDRALAZINE HCL 20 MG/ML IJ SOLN: 10.0000 mg | INTRAMUSCULAR | Status: AC | PRN

## 2019-04-07 MED ORDER — IOHEXOL 350 MG/ML SOLN
INTRAVENOUS | Status: DC | PRN
  Administered 2019-04-07: 200 mL via INTRA_ARTERIAL

## 2019-04-07 MED ORDER — LIDOCAINE HCL (PF) 1 % IJ SOLN
INTRAMUSCULAR | Status: DC | PRN
  Administered 2019-04-07: 2 mL via INTRADERMAL

## 2019-04-07 MED ORDER — FENTANYL CITRATE (PF) 100 MCG/2ML IJ SOLN
INTRAMUSCULAR | Status: DC | PRN
  Administered 2019-04-07: 50 ug via INTRAVENOUS
  Administered 2019-04-07 (×2): 25 ug via INTRAVENOUS

## 2019-04-07 MED ORDER — ONDANSETRON HCL 4 MG/2ML IJ SOLN
4.0000 mg | Freq: Four times a day (QID) | INTRAMUSCULAR | Status: DC | PRN
  Administered 2019-04-08: 4 mg via INTRAVENOUS
  Filled 2019-04-07: qty 2

## 2019-04-07 MED ORDER — VERAPAMIL HCL 2.5 MG/ML IV SOLN
INTRAVENOUS | Status: AC
  Filled 2019-04-07: qty 2

## 2019-04-07 MED ORDER — NITROGLYCERIN 1 MG/10 ML FOR IR/CATH LAB
INTRA_ARTERIAL | Status: DC | PRN
  Administered 2019-04-07: 150 ug via INTRACORONARY

## 2019-04-07 MED ORDER — NITROGLYCERIN IN D5W 200-5 MCG/ML-% IV SOLN
20.0000 ug/min | INTRAVENOUS | Status: DC
  Administered 2019-04-07: 5 ug/min via INTRAVENOUS
  Filled 2019-04-07: qty 250

## 2019-04-07 MED ORDER — SODIUM CHLORIDE 0.9 % WEIGHT BASED INFUSION
1.0000 mL/kg/h | INTRAVENOUS | Status: AC
  Administered 2019-04-07: 1 mL/kg/h via INTRAVENOUS

## 2019-04-07 MED ORDER — MIDAZOLAM HCL 2 MG/2ML IJ SOLN
INTRAMUSCULAR | Status: DC | PRN
  Administered 2019-04-07 (×3): 1 mg via INTRAVENOUS

## 2019-04-07 MED ORDER — ACETAMINOPHEN 500 MG PO TABS
500.0000 mg | ORAL_TABLET | ORAL | Status: DC | PRN
  Administered 2019-04-07: 500 mg via ORAL
  Filled 2019-04-07: qty 1

## 2019-04-07 MED ORDER — SODIUM CHLORIDE 0.9 % IV SOLN
250.0000 mL | INTRAVENOUS | Status: DC | PRN
  Administered 2019-04-07: 10 mL via INTRAVENOUS

## 2019-04-07 MED ORDER — FENTANYL CITRATE (PF) 100 MCG/2ML IJ SOLN
INTRAMUSCULAR | Status: AC
  Filled 2019-04-07: qty 2

## 2019-04-07 MED ORDER — CHLORHEXIDINE GLUCONATE CLOTH 2 % EX PADS
6.0000 | MEDICATED_PAD | Freq: Every day | CUTANEOUS | Status: DC
  Administered 2019-04-07 – 2019-04-08 (×2): 6 via TOPICAL

## 2019-04-07 SURGICAL SUPPLY — 18 items
BALLN SAPPHIRE 2.0X15 (BALLOONS) ×2
BALLOON SAPPHIRE 2.0X15 (BALLOONS) IMPLANT
CATH EXPO 5F MPA-1 (CATHETERS) ×1 IMPLANT
CATH INFINITI 5 FR IM (CATHETERS) ×1 IMPLANT
CATH INFINITI 5FR AL1 (CATHETERS) ×1 IMPLANT
CATH INFINITI 5FR MULTPACK ANG (CATHETERS) ×1 IMPLANT
CATH LAUNCHER 6FR EBU3.5 (CATHETERS) ×1 IMPLANT
GLIDESHEATH SLEND SS 6F .021 (SHEATH) ×1 IMPLANT
GUIDEWIRE INQWIRE 1.5J.035X260 (WIRE) IMPLANT
INQWIRE 1.5J .035X260CM (WIRE) ×2
KIT ENCORE 26 ADVANTAGE (KITS) ×1 IMPLANT
KIT HEART LEFT (KITS) ×2 IMPLANT
PACK CARDIAC CATHETERIZATION (CUSTOM PROCEDURE TRAY) ×2 IMPLANT
SYR MEDRAD MARK 7 150ML (SYRINGE) ×2 IMPLANT
TRANSDUCER W/STOPCOCK (MISCELLANEOUS) ×2 IMPLANT
TUBING CIL FLEX 10 FLL-RA (TUBING) ×2 IMPLANT
WIRE COUGAR XT STRL 190CM (WIRE) ×1 IMPLANT
WIRE HI TORQ WHISPER MS 190CM (WIRE) ×1 IMPLANT

## 2019-04-07 NOTE — ED Notes (Signed)
Dr. Hassell Done (cards) at bedside, pt is to go to have cardiac cath asap.

## 2019-04-07 NOTE — ED Provider Notes (Signed)
Emergency Department Provider Note   I have reviewed the triage vital signs and the nursing notes.   HISTORY  Chief Complaint Chest Pain   HPI Jared Roberts is a 73 y.o. male with history of coronary artery disease status post CABG on the 19th the presents the emergency department today with chest pain.  Patient was awoken from sleep with right-sided chest pain that radiated on his right arm no associated shortness of breath EMS was called.  He took nitroglycerin which dropped his blood pressure little bit and then just prior to arriving here patient went into ventricular fibrillation and arrested.  He was shocked at 200 J and chest compressions were started.  Patient awoke shortly thereafter his EKG was a normal sinus rhythm prior to that.  On arrival here he still feels little bit swimmy headed but otherwise no complaints. No recent illnesses. Had been doing better s/p CABG.    No other associated or modifying symptoms.    Past Medical History:  Diagnosis Date  . Chest discomfort   . Coronary artery disease   . DDD (degenerative disc disease), cervical   . Depression   . Diarrhea   . Dizziness   . DOE (dyspnea on exertion)   . ED (erectile dysfunction) of organic origin   . Fatigue   . Generalized osteoarthritis   . GERD (gastroesophageal reflux disease)   . Hiatal hernia   . History of chronic bronchitis   . History of gastritis    2003  . History of Helicobacter pylori infection    2003  . History of kidney stones   . History of melanoma excision    2015--  right ear  . History of prostate cancer urologist-  dr [name]   2007--  pT2a Nx Mx,  Gleason 3+3, S/P  PROSTATECTOMY  . History of squamous cell carcinoma excision    2011  left knee  . Hx of headache   . Hx of hemorrhoids   . Hyperlipidemia   . Hyperplastic colon polyp 2009  . Hypertension   . Male hypogonadism   . Melanoma ([place])    left 4th toe  . Nephrolithiasis   . Palpitations   . Prostate cancer  ([place])   . Rectal bleeding   . Shoulder pain, left   . Skin cancer   . Tinnitus   . Wears glasses     Patient Active Problem List   Diagnosis Date Noted  . S/P CABG x 3 03/12/2019  . Coronary artery disease 03/12/2019  . Effort angina ([facility])   . Chest pain of uncertain etiology [id number]  . Benign essential HTN 01/31/2019  . Mild obstructive sleep apnea 04/17/2018  . Postviral fatigue syndrome 03/27/2018  . Snoring 03/27/2018  . Excessive daytime sleepiness 03/27/2018  . Confusion 02/13/2018  . Malignant melanoma of toe of left foot ([place]) 09/20/2015  . History of prostate cancer 09/20/2015    Past Surgical History:  Procedure Laterality Date  . AMPUTATION TOE Left 09/07/2015   Procedure: PARTIAL AMPUTATION TOE 4TH LEFT;  Surgeon: [name], MD;  Location: [facility];  Service: Podiatry;  Laterality: Left;  . APPENDECTOMY  1970's  . CORONARY ARTERY BYPASS GRAFT N/A 03/12/2019   Procedure: CORONARY ARTERY BYPASS GRAFTING (CABG) x 3 WITH ENDOSCOPIC HARVESTING OF RIGHT GREATER SAPHENOUS VEIN;  Surgeon: [name], MD;  Location: [facility];  Service: Open Heart Surgery;  Laterality: N/A;  . CYSTO/  CLOT EVACUATION POST PROSTATECTOMY  11-19-2005  .  CYSTO/  REMOVAL URETHRAL STONE AND URETHRAL STAPLES  06-02-2010  . CYSTO/  URETEROSCOPIC STONE EXTRACTION  X2  1990's  . KIDNEY STONE SURGERY    . LEFT HEART CATH AND CORONARY ANGIOGRAPHY N/A 02/25/2019   Procedure: LEFT HEART CATH AND CORONARY ANGIOGRAPHY;  Surgeon: [name], MD;  Location: [facility];  Service: Cardiovascular;  Laterality: N/A;  . MELANOMA EXCISION  2015   right ear  . ROBOT ASSISTED LAPAROSCOPIC RADICAL PROSTATECTOMY  11-09-2005  . SKIN CANCER EXCISION    . TEE WITHOUT CARDIOVERSION N/A 03/12/2019   Procedure: TRANSESOPHAGEAL ECHOCARDIOGRAM (TEE);  Surgeon: [name], MD;  Location: [facility];  Service: Open Heart Surgery;  Laterality: N/A;  . TONSILLECTOMY  child     Current Outpatient Rx  . Order #: [id number] Class: No Print  . Order #: [id number] Class: Historical Med  . Order #: [id number] Class: No Print  . Order #: [id number] Class: Historical Med  . Order #: [id number] Class: Historical Med  . Order #: [id number] Class: Historical Med  . Order #: [id number] Class: Historical Med  . Order #: [id number] Class: Normal  . Order #: [id number] Class: Historical Med  . Order #: [id number] Class: Normal  . Order #: [id number] Class: Historical Med  . Order #: [id number] Class: Normal    Allergies Patient has no known allergies.  Family History  Problem Relation Age of Onset  . Arthritis Mother   . Diabetes Mellitus II Mother   . Congestive Heart Failure Mother   . Osteoarthritis Mother        CHRONIC  . Other Father        KILLED IN WWII    Social History Social History   Tobacco Use  . Smoking status: Never Smoker  . Smokeless tobacco: Never Used  Substance Use Topics  . Alcohol use: Yes    Comment: 1 drink daily  . Drug use: No    Review of Systems  All other systems negative except as documented in the HPI. All pertinent positives and negatives as reviewed in the HPI. ____________________________________________   PHYSICAL EXAM:  VITAL SIGNS: Vitals:   04/07/19 0403 04/07/19 0415 04/07/19 0431 04/07/19 0432  BP:  119/84 (!) 131/95   Pulse:  68 68   Resp:  (!) 22 16   Temp:   (!) 96.7 F (35.9 C)   TempSrc:   Tympanic   SpO2: 99% 99% 93%   Weight:    78 kg  Height:    5\' 10"  (1.778 m)    Constitutional: Alert and oriented. Well appearing and in no acute distress. Eyes: Conjunctivae are pale. PERRL. EOMI. Head: Atraumatic. Nose: No congestion/rhinnorhea. Mouth/Throat: Mucous membranes are dry.  Oropharynx non-erythematous. Pale lips Neck: No stridor.  No meningeal signs.   Cardiovascular: Normal rate, regular rhythm. Good peripheral circulation. Grossly normal heart sounds.   Respiratory: Normal respiratory effort.  No retractions.  Lungs CTAB. Gastrointestinal: Soft and nontender. No distention.  Musculoskeletal: No lower extremity tenderness nor edema. No gross deformities of extremities. Neurologic:  Normal speech and language. No gross focal neurologic deficits are appreciated.  Skin:  Skin is warm, dry and intact. No rash noted.   ____________________________________________   LABS (all labs ordered are listed, but only abnormal results are displayed)  Labs Reviewed  CBC - Abnormal; Notable for the following components:      Result Value   WBC 14.0 (*)    nRBC 0.5 (*)    All other components within normal limits  I-STAT CHEM 8, ED -  Abnormal; Notable for the following components:   Glucose, Bld 164 (*)    TCO2 18 (*)    All other components within normal limits  SARS CORONAVIRUS 2 (HOSPITAL ORDER, [facility])  MAGNESIUM  BASIC METABOLIC PANEL  I-STAT BETA HCG BLOOD, ED ([facility], [facility], [facility] ONLY)  TYPE AND SCREEN  TROPONIN I (HIGH SENSITIVITY)   ____________________________________________  EKG   EKG Interpretation  Date/Time:  Monday April 07 2019 03:53:45 EDT Ventricular Rate:  67 PR Interval:    QRS Duration: 88 QT Interval:  385 QTC Calculation: 407 R Axis:   60 Text Interpretation:  Sinus rhythm Anteroseptal infarct, age indeterminate new inverted T waves in V2 since previous Reconfirmed by Merrily Pew [phone]) on 04/07/2019 4:15:04 AM       EKG Interpretation  Date/Time:  Monday April 07 2019 04:08:54 EDT Ventricular Rate:  69 PR Interval:    QRS Duration: 85 QT Interval:  420 QTC Calculation: 450 R Axis:   49 Text Interpretation:  Sinus rhythm Anterolateral infarct, age indeterminate persistent t wave abnormalities in V2, possibly lead placement? minor ST elevation in inferior leads, similar to some previous  Confirmed by Merrily Pew [phone]) on 04/07/2019 4:19:18 AM        ____________________________________________  RADIOLOGY  No results  found.  ____________________________________________   PROCEDURES  Procedure(s) performed:   .Critical Care Performed by: Merrily Pew, MD Authorized by: Merrily Pew, MD   Critical care provider statement:    Critical care time (minutes):  45   Critical care was necessary to treat or prevent imminent or life-threatening deterioration of the following conditions:  Cardiac failure   Critical care was time spent personally by me on the following activities:  Discussions with consultants, evaluation of patient's response to treatment, examination of patient, ordering and performing treatments and interventions, ordering and review of laboratory studies, ordering and review of radiographic studies, pulse oximetry, re-evaluation of patient's condition, obtaining history from patient or surrogate and review of old charts     ____________________________________________   INITIAL IMPRESSION / ASSESSMENT AND PLAN / ED COURSE  vfib arrest just PTA. Placed on pads immediately. Nursing paged cardiology, want more information prior to consult.  Also consider anemia, PE as causes for symptoms. Plan for rapid turnaround hb and if normal or near normal will get PE study. Checking lytes as well.   2/2 presentation and evolving ECG changes, cards to take to cath.   Pertinent labs & imaging results that were available during my care of the patient were reviewed by me and considered in my medical decision making (see chart for details).  ____________________________________________  FINAL CLINICAL IMPRESSION(S) / ED DIAGNOSES  Final diagnoses:  None     MEDICATIONS GIVEN DURING THIS VISIT:  Medications  sodium chloride flush (NS) 0.9 % injection 3 mL (has no administration in time range)  fentaNYL (SUBLIMAZE) injection 50 mcg (50 mcg Intravenous Given 04/07/19 0429)     NEW OUTPATIENT MEDICATIONS STARTED DURING THIS VISIT:  New Prescriptions   No medications on file    Note:   This note was prepared with assistance of Dragon voice recognition software. Occasional wrong-word or sound-a-like substitutions may have occurred due to the inherent limitations of voice recognition software.   Dyson Sevey, Corene Cornea, MD 04/07/19 [phone]

## 2019-04-07 NOTE — [place] Notes (Signed)
CRITICAL VALUE ALERT  Critical Value:  Troponin >4.4  Date & Time Notied:  04/07/19 0722  Provider Notified: Pt posted cath expected find, MD rounded this AM no further orders placed.

## 2019-04-07 NOTE — [place] Notes (Addendum)
   Clinical status improved.  Mild residual discomfort.  Images reviewed.  Discussed with patient why stent was not implanted.  Start IV nitro and run 18 to 24 hours.

## 2019-04-07 NOTE — Interval H&P Note (Signed)
Cath Lab Visit (complete for each Cath Lab visit)  Clinical Evaluation Leading to the Procedure:   ACS: Yes.    Non-ACS:    Anginal Classification: CCS IV  Anti-ischemic medical therapy: Minimal Therapy (1 class of medications)  Non-Invasive Test Results: No non-invasive testing performed  Prior CABG: Previous CABG      History and Physical Interval Note:  04/07/2019 5:26 AM  Jared Roberts  has presented today for surgery, with the diagnosis of STEMI.  The various methods of treatment have been discussed with the patient and family. After consideration of risks, benefits and other options for treatment, the patient has consented to  Procedure(s): LEFT HEART CATH AND CORS/GRAFTS ANGIOGRAPHY (N/A) as a surgical intervention.  The patient's history has been reviewed, patient examined, no change in status, stable for surgery.  I have reviewed the patient's chart and labs.  Questions were answered to the patient's satisfaction.     [name]

## 2019-04-07 NOTE — ED Triage Notes (Signed)
Pt transported from home by EMS with c/o R sided chest dull constant pain, pain woke him from sleep with radiation to arm and back, pt states he became clammy. Upon arrival to EMS state pt became unresponsive and noted to be in VFIB on monitor, pt administered shock x 1 at 200j, pt returned to [place].  Pt very pale on arrival, appears weak but oriented.

## 2019-04-07 NOTE — [place] Notes (Signed)
  Echocardiogram 2D Echocardiogram has been performed.  Jennette Dubin 04/07/2019, 9:12 AM

## 2019-04-07 NOTE — H&P (Signed)
History and Physical   Patient ID: Jared Roberts, MRN: [id number], DOB: [birth year]   Date of Encounter: 04/07/2019, 4:54 AM  Primary Care Provider: Deland Pretty, MD Cardiologist: Nahser Electrophysiologist:  NA  Chief Complaint:  VF arrest  History of Present Illness: Jared Roberts is a 73 y.o. male w/ 3-v CAD, s/p CABG 03-12-19 w/ LIMA-LAD, SVG-OM, SVG-PDA who presents tonight to the ED via EMS w/ VF arrest. Pt states that he was awoken from sleep by a MS/R-sided pressure in his chest, 4-5/10, w/ associated nausea and diaphoresis. He called EMS and during transport to [facility], became unresponsive. Monitor showed VF arrest. He was administered a single shock at 200J and NSR was restored. EKG post-arrest shows possible new Q waves and new TWI in V1-V3 as compared to tracing from 03-13-19.  Post-arrest, pt still c/o chest discomfort, 4-5/10, similar to pre-arrest discomfort. It is a MS/R-sided "pressure" feeling with associated nausea. No SOB. NTG SL dropped his pressure but did not significantly alleviate the pain. He has had minimal relief from fentanyl in the ED.  He says he had actually been doing OK post-CABG until tonight; he denies any exertional angina sx for the past couple weeks since the surgery.  Past Medical History:  Diagnosis Date   Chest discomfort    Coronary artery disease    DDD (degenerative disc disease), cervical    Depression    Diarrhea    Dizziness    DOE (dyspnea on exertion)    ED (erectile dysfunction) of organic origin    Fatigue    Generalized osteoarthritis    GERD (gastroesophageal reflux disease)    Hiatal hernia    History of chronic bronchitis    History of gastritis    [id number]   History of Helicobacter pylori infection    2003   History of kidney stones    History of melanoma excision    2015--  right ear   History of prostate cancer urologist-  dr [name]   2007--  pT2a Nx Mx,  Gleason 3+3, S/P  PROSTATECTOMY   History of squamous  cell carcinoma excision    2011  left knee   Hx of headache    Hx of hemorrhoids    Hyperlipidemia    Hyperplastic colon polyp 2009   Hypertension    Male hypogonadism    Melanoma ([place])    left 4th toe   Nephrolithiasis    Palpitations    Prostate cancer ([place])    Rectal bleeding    Shoulder pain, left    Skin cancer    Tinnitus    Wears glasses     Past Surgical History:  Procedure Laterality Date   AMPUTATION TOE Left 09/07/2015   Procedure: PARTIAL AMPUTATION TOE 4TH LEFT;  Surgeon: [name], MD;  Location: [place];  Service: Podiatry;  Laterality: Left;   APPENDECTOMY  1970's   CORONARY ARTERY BYPASS GRAFT N/A 03/12/2019   Procedure: CORONARY ARTERY BYPASS GRAFTING (CABG) x 3 WITH ENDOSCOPIC HARVESTING OF RIGHT GREATER SAPHENOUS VEIN;  Surgeon: [name], MD;  Location: [place];  Service: Open Heart Surgery;  Laterality: N/A;   CYSTO/  CLOT EVACUATION POST PROSTATECTOMY  11-19-2005   CYSTO/  REMOVAL URETHRAL STONE AND URETHRAL STAPLES  06-02-2010   CYSTO/  URETEROSCOPIC STONE EXTRACTION  X2  1990's   KIDNEY STONE SURGERY     LEFT HEART CATH AND CORONARY ANGIOGRAPHY N/A 02/25/2019   Procedure: LEFT HEART CATH AND CORONARY ANGIOGRAPHY;  Surgeon: [name], MD;  Location: [place] [facility];  Service: Cardiovascular;  Laterality: N/A;   MELANOMA EXCISION  2015   right ear   ROBOT ASSISTED LAPAROSCOPIC RADICAL PROSTATECTOMY  11-09-2005   SKIN CANCER EXCISION     TEE WITHOUT CARDIOVERSION N/A 03/12/2019   Procedure: TRANSESOPHAGEAL ECHOCARDIOGRAM (TEE);  Surgeon: [name], MD;  Location: [place];  Service: Open Heart Surgery;  Laterality: N/A;   TONSILLECTOMY  child     Prior to Admission medications   Medication Sig Start Date End Date Taking? Authorizing Provider  acetaminophen (TYLENOL) 500 MG tablet Take 1 tablet (500 mg total) by mouth every 4 (four) hours as needed for mild pain. 03/17/19    Nani Skillern, PA-C  ascorbic acid (VITAMIN C) 1000 MG tablet Take 1,000 mg by mouth daily.    [provider]  aspirin EC 325 MG EC tablet Take 1 tablet (325 mg total) by mouth daily. 03/17/19   Nani Skillern, PA-C  atorvastatin (LIPITOR) 10 MG tablet Take 10 mg by mouth at bedtime.  01/20/19   [provider]  calcium carbonate (TUMS - DOSED IN MG ELEMENTAL CALCIUM) 500 MG chewable tablet Chew 1-2 tablets by mouth 3 (three) times daily as needed for indigestion or heartburn.     [provider]  Cholecalciferol (VITAMIN D) 2000 units CAPS Take 2,000 Units by mouth 2 (two) times a day.     [provider]  ibuprofen (ADVIL) 200 MG tablet Take 400 mg by mouth every 6 (six) hours as needed.    [provider]  metoprolol tartrate (LOPRESSOR) 50 MG tablet Take 1 tablet (50 mg total) by mouth 2 (two) times daily. 03/26/19   Nahser, Wonda Cheng, MD  Multiple Vitamin (MULTIVITAMIN WITH MINERALS) TABS tablet Take 1 tablet by mouth daily.    [provider]  olmesartan (BENICAR) 20 MG tablet Take 1 tablet (20 mg total) by mouth daily. 03/17/19   Nani Skillern, PA-C  Probiotic Product (PROBIOTIC DAILY PO) Take 1 capsule by mouth daily.     [provider]  traMADol (ULTRAM) 50 MG tablet Take 1 tablet (50 mg total) by mouth every 4 (four) hours as needed for moderate pain. 03/17/19   Nani Skillern, PA-C     Allergies: No Known Allergies  Social History:  The patient  reports that he has never smoked. He has never used smokeless tobacco. He reports current alcohol use. He reports that he does not use drugs.   Family History:  The patient's family history includes Arthritis in his mother; Congestive Heart Failure in his mother; Diabetes Mellitus II in his mother; Osteoarthritis in his mother; Other in his father.   ROS:  Please see the history of present illness.     All other systems reviewed and negative.   Vital  Signs: Blood pressure (!) 131/95, pulse 68, temperature (!) 96.7 F (35.9 C), temperature source Tympanic, resp. rate 16, height 5\' 10"  (1.778 m), weight 78 kg, SpO2 93 %.  PHYSICAL EXAM: General:  Well nourished, well developed, in mild distress from chest discomfort HEENT: normal Lymph: no adenopathy Neck: no JVD Endocrine:  No thryomegaly Vascular: No carotid bruits; DP pulses 2+ bilaterally   Cardiac:  normal S1, S2; RRR; no murmur  Lungs:  clear to auscultation bilaterally, no wheezing, rhonchi or rales  Abd: soft, nontender, no hepatomegaly  Ext: no edema Musculoskeletal:  No deformities, BUE and BLE strength normal and equal Skin:  warm and dry  Neuro:  CNs 2-12 intact, no focal abnormalities noted Psych:  Normal affect   EKG:  EMS tracing shows VF Post-arrest EKG shows NSR with Q's and new TWI/TWF in V1-V3  Labs:   Lab Results  Component Value Date   WBC 14.0 (H) 04/07/2019   HGB 13.3 04/07/2019   HCT 39.0 04/07/2019   MCV 92.9 04/07/2019   PLT 245 04/07/2019   Recent Labs  Lab 04/07/19 0431  NA 140  K 3.5  CL 110  BUN 16  CREATININE 1.10  GLUCOSE 164*   No results for input(s): CKTOTAL, CKMB, TROPONINI in the last 72 hours. Troponin (Point of Care Test) No results for input(s): TROPIPOC in the last 72 hours.  No results found for: CHOL, HDL, LDLCALC, TRIG No results found for: DDIMER Other labs still pending at this time  Radiology/Studies:  Dg Chest 2 View  Result Date: 03/15/2019 CLINICAL DATA:  Postop CABG EXAM: CHEST - 2 VIEW COMPARISON:  03/14/2019 FINDINGS: Small bilateral pleural effusions. Bibasilar atelectasis. There is hyperinflation of the lungs compatible with COPD. Prior CABG. Heart is mildly enlarged. IMPRESSION: COPD. Small bilateral effusions with bibasilar atelectasis. Electronically Signed   By: Rolm Baptise M.D.   On: 03/15/2019 18:42   Dg Chest Port 1 View  Result Date: 03/14/2019 CLINICAL DATA:  Pleural effusion status post  coronary bypass graft. EXAM: PORTABLE CHEST 1 VIEW COMPARISON:  Radiograph of March 13, 2019. FINDINGS: Stable cardiomegaly. Swan-Ganz catheter has been removed. Right internal jugular venous sheath remains. Left-sided chest tube is unchanged in position without definite pneumothorax. Minimal bibasilar subsegmental atelectasis is noted with minimal pleural effusions. Bony thorax is unremarkable. IMPRESSION: Stable position of left-sided chest tube without pneumothorax. Stable minimal bibasilar subsegmental atelectasis with minimal pleural effusions. Electronically Signed   By: Marijo Conception M.D.   On: 03/14/2019 06:47   Dg Chest Port 1 View  Result Date: 03/13/2019 CLINICAL DATA:  Chest tube, post CABG EXAM: PORTABLE CHEST 1 VIEW COMPARISON:  03/12/2019 FINDINGS: Postoperative changes from CABG. Left chest tube and Swan-Ganz catheter remain in place. The Swan-Ganz catheter has been advanced into the right lower lobe pulmonary artery. Interval extubation and removal of NG tube. Mild cardiomegaly and vascular congestion. Left base atelectasis is similar to prior study. No visible significant effusions. No pneumothorax. IMPRESSION: Slight advancement of the Swan-Ganz catheter into the right lower lobe pulmonary artery. Interval extubation. Cardiomegaly, vascular congestion.  Left base atelectasis. Electronically Signed   By: Rolm Baptise M.D.   On: 03/13/2019 10:27   Dg Chest Port 1 View  Result Date: 03/12/2019 CLINICAL DATA:  Postop day 0 CABG. EXAM: PORTABLE CHEST 1 VIEW COMPARISON:  03/05/2019 and earlier. FINDINGS: Sternotomy for CABG. Endotracheal tube tip in satisfactory position projecting approximately 6 cm above the carina. Nasogastric tube courses below the diaphragm into the stomach. RIGHT jugular Swan-Ganz catheter tip projects over the proximal RIGHT main pulmonary artery. LEFT femoral catheter tip projects over the UPPER heart. Cardiac silhouette normal in size for AP portable technique.  Minimal linear atelectasis in the LEFT mid lung. Mild atelectasis involving the LEFT LOWER LOBE. Lungs otherwise clear. Pulmonary vascularity normal. No visible pleural effusions. LEFT chest tube in place with no pneumothorax. IMPRESSION: 1. Support apparatus satisfactory. 2. Mild linear atelectasis involving the LEFT mid lung and mild LEFT LOWER LOBE atelectasis. No acute cardiopulmonary disease otherwise. Electronically Signed   By: Evangeline Dakin M.D.   On: 03/12/2019 14:48    TTE 02-06-19  1. The left ventricle has hyperdynamic systolic function, with an ejection fraction of >65%. The cavity size was normal. Left ventricular diastolic Doppler parameters are consistent with impaired relaxation.  2. The right ventricle has normal systolic function.  3. No HD sig valvular pathology noted  02-24-09 LHC  The left ventricular systolic function is normal.  LV end diastolic pressure is normal.  The left ventricular ejection fraction is 55-65% by visual estimate.  Prox RCA lesion is 90% stenosed.  Prox LAD to Mid LAD lesion is 60% stenosed.  Mid LAD lesion is 80% stenosed.  Mid Cx lesion is 80% stenosed.     03-12-19 CABG X 3.  LIMA LAD, RSVG OM, RSVG PDA    ASSESSMENT AND PLAN:   1. CP/VF arrest: will plan for urgent LHC given ongoing CP post-arrest, and EKG changes suggestive of possible ischemic process involving the anterior wall. TTE to reevaluate LVEF  2. HTN/dyslipidemia: resume pre-hospital regimen and titrate as needed  3. PAD: h/o carotid stenosis and TIA. Stable from neuro standpoint at this time post-arrest  Thank you for the opportunity to participate in the care of this patient.  For questions or updates, please contact [place] Please consult [email or web address] for contact info under    Signed,  Rudean Curt, MD, [facility]  04/07/2019 4:54 AM

## 2019-04-08 ENCOUNTER — Other Ambulatory Visit: Payer: Self-pay

## 2019-04-08 DIAGNOSIS — I4901 Ventricular fibrillation: Secondary | ICD-10-CM

## 2019-04-08 LAB — BASIC METABOLIC PANEL
Anion gap: 8 (ref 5–15)
BUN: 10 mg/dL (ref 8–23)
CO2: 19 mmol/L — ABNORMAL LOW (ref 22–32)
Calcium: 8.7 mg/dL — ABNORMAL LOW (ref 8.9–10.3)
Chloride: 111 mmol/L (ref 98–111)
Creatinine, Ser: 0.84 mg/dL (ref 0.61–1.24)
GFR calc Af Amer: >60 mL/min (ref >60)
GFR calc non Af Amer: >60 mL/min (ref >60)
Glucose, Bld: 107 mg/dL — ABNORMAL HIGH (ref 70–99)
Potassium: 3.7 mmol/L (ref 3.5–5.1)
Sodium: 138 mmol/L (ref 135–145)

## 2019-04-08 LAB — LIPID PANEL
Cholesterol: 147 mg/dL (ref 0–200)
HDL: 43 mg/dL (ref >40)
LDL Cholesterol: 67 mg/dL (ref 0–99)
Total CHOL/HDL Ratio: 3.4 RATIO
Triglycerides: 187 mg/dL — ABNORMAL HIGH (ref <150)
VLDL: 37 mg/dL (ref 0–40)

## 2019-04-08 LAB — CBC
HCT: 34.3 % — ABNORMAL LOW (ref 39.0–52.0)
Hemoglobin: 12.2 g/dL — ABNORMAL LOW (ref 13.0–17.0)
MCH: 32.1 pg (ref 26.0–34.0)
MCHC: 35.6 g/dL (ref 30.0–36.0)
MCV: 90.3 fL (ref 80.0–100.0)
Platelets: 172 10*3/uL (ref 150–400)
RBC: 3.8 MIL/uL — ABNORMAL LOW (ref 4.22–5.81)
RDW: 12.6 % (ref 11.5–15.5)
WBC: 8.2 10*3/uL (ref 4.0–10.5)
nRBC: 0 % (ref 0.0–0.2)

## 2019-04-08 LAB — TROPONIN I (HIGH SENSITIVITY): Troponin I (High Sensitivity): 6463 ng/L (ref <18)

## 2019-04-08 NOTE — Plan of Care (Signed)
  Problem: Education: Goal: Understanding of CV disease, CV risk reduction, and recovery process will improve Outcome: Progressing Goal: Individualized Educational Video(s) Outcome: Progressing   Problem: Activity: Goal: Ability to return to baseline activity level will improve Outcome: Progressing   Problem: Cardiovascular: Goal: Ability to achieve and maintain adequate cardiovascular perfusion will improve Outcome: Progressing Goal: Vascular access site(s) Level 0-1 will be maintained Outcome: Progressing   Problem: Health Behavior/Discharge Planning: Goal: Ability to safely manage health-related needs after discharge will improve Outcome: Progressing   Problem: Education: Goal: Knowledge of General Education information will improve Description: Including pain rating scale, medication(s)/side effects and non-pharmacologic comfort measures Outcome: Progressing   Problem: Health Behavior/Discharge Planning: Goal: Ability to manage health-related needs will improve Outcome: Progressing   Problem: Clinical Measurements: Goal: Ability to maintain clinical measurements within normal limits will improve Outcome: Progressing Goal: Will remain free from infection Outcome: Progressing Goal: Diagnostic test results will improve Outcome: Progressing Goal: Cardiovascular complication will be avoided Outcome: Progressing   Problem: Activity: Goal: Risk for activity intolerance will decrease Outcome: Progressing   Problem: Coping: Goal: Level of anxiety will decrease Outcome: Progressing   Problem: Pain Managment: Goal: General experience of comfort will improve Outcome: Progressing   Problem: Safety: Goal: Ability to remain free from injury will improve Outcome: Progressing   Problem: Skin Integrity: Goal: Risk for impaired skin integrity will decrease Outcome: Progressing

## 2019-04-08 NOTE — Progress Notes (Signed)
Attempted report. RN not available. Will call back in 53min.

## 2019-04-08 NOTE — Plan of Care (Signed)
  Problem: Education: Goal: Understanding of CV disease, CV risk reduction, and recovery process will improve Outcome: Progressing Goal: Individualized Educational Video(s) Outcome: Progressing   Problem: Cardiovascular: Goal: Ability to achieve and maintain adequate cardiovascular perfusion will improve Outcome: Progressing   Problem: Health Behavior/Discharge Planning: Goal: Ability to safely manage health-related needs after discharge will improve Outcome: Progressing   Problem: Education: Goal: Knowledge of General Education information will improve Description: Including pain rating scale, medication(s)/side effects and non-pharmacologic comfort measures Outcome: Progressing   Problem: Health Behavior/Discharge Planning: Goal: Ability to manage health-related needs will improve Outcome: Progressing   Problem: Clinical Measurements: Goal: Ability to maintain clinical measurements within normal limits will improve Outcome: Progressing Goal: Diagnostic test results will improve Outcome: Progressing Goal: Cardiovascular complication will be avoided Outcome: Progressing   Problem: Coping: Goal: Level of anxiety will decrease Outcome: Progressing   Problem: Pain Managment: Goal: General experience of comfort will improve Outcome: Progressing   Problem: Safety: Goal: Ability to remain free from injury will improve Outcome: Progressing   Problem: Skin Integrity: Goal: Risk for impaired skin integrity will decrease Outcome: Progressing

## 2019-04-08 NOTE — Progress Notes (Signed)
CARDIAC REHAB PHASE I   PTCA education completed with pt and wife. Pt shows ability on proper use of NTG. Reviewed risk factors. Pt states no difficulty with ambulation, pt noted to have walked >1018ft earlier today. Encouraged to ambulate as able. Will follow-up tomorrow with pt. Pt previously referred to [facility] [place] after CABG.  [id number] Rufina Falco, RN BSN 04/08/2019 3:01 PM

## 2019-04-08 NOTE — Progress Notes (Addendum)
Progress Note  Patient Name: Jared Roberts Date of Encounter: 04/08/2019  Primary Cardiologist: Mertie Moores, MD   Subjective   He is asymptomatic this morning.  He is having some low back discomfort and intrascapular discomfort.  He did have ventricular fibrillation that required defibrillation yesterday.  IV nitroglycerin started last p.m. led to gradual resolution of residual ischemic chest and arm pain.  This is not recurred.  Inpatient Medications    Scheduled Meds: . aspirin EC  81 mg Oral Daily  . atorvastatin  10 mg Oral QHS  . Chlorhexidine Gluconate Cloth  6 each Topical Daily  . enoxaparin (LOVENOX) injection  40 mg Subcutaneous Q24H  . irbesartan  150 mg Oral Daily  . metoprolol tartrate  50 mg Oral BID  . sodium chloride flush  3 mL Intravenous Once  . sodium chloride flush  3 mL Intravenous Q12H  . ticagrelor  90 mg Oral BID   Continuous Infusions: . sodium chloride 10 mL/hr at 04/08/19 0800  . nitroGLYCERIN 5 mcg/min (04/08/19 0800)   PRN Meds: sodium chloride, acetaminophen, nitroGLYCERIN, ondansetron (ZOFRAN) IV, sodium chloride flush, traMADol   Vital Signs    Vitals:   04/08/19 0500 04/08/19 0600 04/08/19 0800 04/08/19 0925  BP: 123/87 (!) 145/72 114/80 130/71  Pulse: (!) 56 (!) 50 75 79  Resp:      Temp:   (!) 97.4 F (36.3 C)   TempSrc:   Oral   SpO2: 96% 97% 96%   Weight:  77.8 kg    Height:  5\' 10"  (1.778 m)      Intake/Output Summary (Last 24 hours) at 04/08/2019 0935 Last data filed at 04/08/2019 0800 Gross per 24 hour  Intake 791.9 ml  Output 1900 ml  Net -1108.1 ml   Last 3 Weights 04/08/2019 04/07/2019 03/26/2019  Weight (lbs) 171 lb 8.3 oz 171 lb 15.3 oz 172 lb  Weight (kg) 77.8 kg 78 kg 78.019 kg      Telemetry    Normal sinus rhythm without ventricular ectopy.- Personally Reviewed  ECG    Sinus bradycardia with no evidence of ischemia.- Personally Reviewed  Physical Exam  Healthy-appearing GEN: No acute distress.    Neck: No JVD Cardiac: RRR, no murmurs, rubs, or gallops.  The radial cath site is unremarkable. Respiratory: Clear to auscultation bilaterally. GI: Soft, nontender, non-distended  MS: No edema; No deformity. Neuro:  Nonfocal  Psych: Normal affect   Labs    High Sensitivity Troponin:   Recent Labs  Lab 04/07/19 0359 04/07/19 0722  TROPONINIHS 20* 4,481*      Chemistry Recent Labs  Lab 04/07/19 0359 04/07/19 0431 04/08/19 0300  NA 138 140 138  K 3.5 3.5 3.7  CL 109 110 111  CO2 16*  --  19*  GLUCOSE 169* 164* 107*  BUN 15 16 10   CREATININE 1.24 1.10 0.84  CALCIUM 9.1  --  8.7*  GFRNONAA 57*  --  >60  GFRAA >60  --  >60  ANIONGAP 13  --  8     Hematology Recent Labs  Lab 04/07/19 0359 04/07/19 0431 04/08/19 0300  WBC 14.0*  --  8.2  RBC 4.34  --  3.80*  HGB 13.7 13.3 12.2*  HCT 40.3 39.0 34.3*  MCV 92.9  --  90.3  MCH 31.6  --  32.1  MCHC 34.0  --  35.6  RDW 12.4  --  12.6  PLT 245  --  172    BNPNo results  for input(s): BNP, PROBNP in the last 168 hours.   DDimer No results for input(s): DDIMER in the last 168 hours.   Radiology    Dg Chest Port 1 View  Result Date: 04/07/2019 CLINICAL DATA:  Chest pain. EXAM: PORTABLE CHEST 1 VIEW COMPARISON:  Radiographs of March 15, 2019. FINDINGS: Stable cardiomediastinal silhouette. Status post coronary bypass graft. No pneumothorax is noted. Right lung is clear. Minimal left basilar subsegmental atelectasis is noted with small pleural effusion. Bony thorax is unremarkable. IMPRESSION: Minimal left basilar subsegmental atelectasis is noted with small left pleural effusion. Electronically Signed   By: Marijo Conception M.D.   On: 04/07/2019 07:37    Cardiac Studies   2D Doppler echocardiogram 04/08/2019 IMPRESSIONS    1. The left ventricle has normal systolic function, with an ejection fraction of 55-60%. The cavity size was normal. Left ventricular diastolic Doppler parameters are consistent with  pseudonormalization. No evidence of left ventricular regional wall  motion abnormalities.  2. Normal RV size with mildly decreased systolic function.  3. The aortic valve is tricuspid. Mild calcification of the aortic valve. No stenosis of the aortic valve.  4. The aortic root is normal in size and structure.  5. Left atrial size was mildly dilated.  6. No evidence of mitral valve stenosis. No significant regurgitation.  7. Normal IVC size. No complete TR doppler jet so unable to estimate PA systolic pressure.   Patient Profile     73 y.o. male history of coronary artery bypass grafting August 2020 with LIMA to LAD, SVG to RCA, and SVG to distal circumflex.  Presented with acute MI complicated by VF arrest in the ambulance bay requiring defibrillation.  Mechanical reperfusion performed using angioplasty in the distal native circumflex.  The occluded saphenous vein graft to the circumflex was not treated.  Very low enzyme rise.  Normal EKG postevent.  Assessment & Plan    1. Acute lateral wall infarction due to occlusion of the saphenous vein graft to the distal circumflex and probable embolization into the obtuse marginal beyond the graft insertion site.  Successful mechanical reperfusion with angioplasty only due to vessel size.  Will discontinue IV nitroglycerin.  He is stable and safe to transfer out of unit. 2. Acute ischemic complication with ventricular fibrillation treated with defibrillation.  Beta-blocker therapy will be optimized.  Echo confirms normal LV systolic function and therefore no requirement for AICD.  VF was related to acute ischemia which is been resolved. 3. Essential hypertension -target control 4. Hyperlipidemia-cardiac LDL less than 70.  Plan aggressive preventive therapy: LDL less than 70; blood pressure 130/80 mmHg or less; if elevated triglyceride, consider icosapent ethyl.  Will need phase 1 and 2 cardiac rehab  For questions or updates, please contact [place] Please consult [email or web address] for contact info under        Signed, Sinclair Grooms, MD  04/08/2019, 9:35 AM

## 2019-04-09 DIAGNOSIS — E781 Pure hyperglyceridemia: Secondary | ICD-10-CM

## 2019-04-09 DIAGNOSIS — E785 Hyperlipidemia, unspecified: Secondary | ICD-10-CM

## 2019-04-09 DIAGNOSIS — I10 Essential (primary) hypertension: Secondary | ICD-10-CM

## 2019-04-09 MED ORDER — IRBESARTAN 150 MG PO TABS: 150.0000 mg | ORAL_TABLET | Freq: Every day | ORAL | 3 refills | Status: DC

## 2019-04-09 MED ORDER — NITROGLYCERIN 0.4 MG SL SUBL: 0.4000 mg | SUBLINGUAL_TABLET | SUBLINGUAL | 0 refills | Status: DC | PRN

## 2019-04-09 MED ORDER — ASPIRIN 81 MG PO TBEC: 81.0000 mg | DELAYED_RELEASE_TABLET | Freq: Every day | ORAL | Status: DC

## 2019-04-09 MED ORDER — TICAGRELOR 90 MG PO TABS: 90.0000 mg | ORAL_TABLET | Freq: Two times a day (BID) | ORAL | 3 refills | Status: DC

## 2019-04-09 MED FILL — NITROGLYCERIN 0.4 MG TAB SL: 0.4 | 8 days supply | Qty: 25 | Fill #0

## 2019-04-09 MED FILL — BRILINTA 90 MG TABLET: 90 | 30 days supply | Qty: 60 | Fill #0

## 2019-04-09 MED FILL — IRBESARTAN 150 MG TABLET: 150 | 30 days supply | Qty: 30 | Fill #0

## 2019-04-09 NOTE — Progress Notes (Signed)
Pt.had 8 beats of wide QRS;pt.was asymptomatic;MD on call [facility] made aware.

## 2019-04-09 NOTE — Consult Note (Signed)
   [facility] Inpatient Consult   04/09/2019  Jared Roberts 1945/08/17 [id number]   Patient screened for medium risk score for unplanned readmission score  and for less than 30 days re-hospitalizations to check if potential [facility] service needs.. Primary Care Provider is Deland Pretty, MD, [facility].  This office provides the transition of care follow up.  Chart review reveals patient being followed at Cardiac Rehab.  Call for patient reveals patient already transitioned home.  Plan: Will assign to General EMMI follow up.   For questions contact:   Natividad Brood, RN BSN [place] [facility] Liaison  [phone] business mobile phone Toll free office [phone]  Fax number: [phone] [email or web address]

## 2019-04-09 NOTE — Progress Notes (Signed)
Progress Note  Patient Name: Jared Roberts Date of Encounter: 04/09/2019  Primary Cardiologist: Mertie Moores, MD   Subjective   No problems over night. Had 8 beat AIVR.  Having chronic difficulty sleeping.  Inpatient Medications    Scheduled Meds: . aspirin EC  81 mg Oral Daily  . atorvastatin  10 mg Oral QHS  . Chlorhexidine Gluconate Cloth  6 each Topical Daily  . enoxaparin (LOVENOX) injection  40 mg Subcutaneous Q24H  . irbesartan  150 mg Oral Daily  . metoprolol tartrate  50 mg Oral BID  . sodium chloride flush  3 mL Intravenous Q12H  . ticagrelor  90 mg Oral BID   Continuous Infusions:  PRN Meds: acetaminophen, nitroGLYCERIN, ondansetron (ZOFRAN) IV, sodium chloride flush, traMADol   Vital Signs    Vitals:   04/08/19 2052 04/08/19 2205 04/08/19 2213 04/09/19 0449  BP: (!) 150/99 (!) 145/91  (!) 150/95  Pulse: 85 78 73 63  Resp:      Temp: 97.9 F (36.6 C)   97.6 F (36.4 C)  TempSrc: Oral   Oral  SpO2: 98%  95% 95%  Weight: 77.7 kg     Height: 5\' 10"  (1.778 m)       Intake/Output Summary (Last 24 hours) at 04/09/2019 0608 Last data filed at 04/08/2019 1200 Gross per 24 hour  Intake 503 ml  Output -  Net 503 ml   Last 3 Weights 04/08/2019 04/08/2019 04/07/2019  Weight (lbs) 171 lb 6.4 oz 171 lb 8.3 oz 171 lb 15.3 oz  Weight (kg) 77.747 kg 77.8 kg 78 kg      Telemetry    8 beat AIVR during the night.- Personally Reviewed  ECG    Sinus bradycardia with no evidence of ischemia.- Personally Reviewed  Physical Exam  Healthy-appearing GEN: No acute distress.   Neck: No JVD Cardiac: RRR, no murmurs, rubs, or gallops.  The radial cath site is unremarkable. Respiratory: Clear to auscultation bilaterally. GI: Soft, nontender, non-distended  MS: No edema; No deformity. Neuro:  Nonfocal  Psych: Normal affect   Labs    High Sensitivity Troponin:   Recent Labs  Lab 04/07/19 0359 04/07/19 0722 04/08/19 0857  TROPONINIHS 20* 4,481* 6,463*    Chemistry Recent Labs  Lab 04/07/19 0359 04/07/19 0431 04/08/19 0300  NA 138 140 138  K 3.5 3.5 3.7  CL 109 110 111  CO2 16*  --  19*  GLUCOSE 169* 164* 107*  BUN 15 16 10   CREATININE 1.24 1.10 0.84  CALCIUM 9.1  --  8.7*  GFRNONAA 57*  --  >60  GFRAA >60  --  >60  ANIONGAP 13  --  8     Hematology Recent Labs  Lab 04/07/19 0359 04/07/19 0431 04/08/19 0300  WBC 14.0*  --  8.2  RBC 4.34  --  3.80*  HGB 13.7 13.3 12.2*  HCT 40.3 39.0 34.3*  MCV 92.9  --  90.3  MCH 31.6  --  32.1  MCHC 34.0  --  35.6  RDW 12.4  --  12.6  PLT 245  --  172    BNPNo results for input(s): BNP, PROBNP in the last 168 hours.   DDimer No results for input(s): DDIMER in the last 168 hours.   Radiology    Dg Chest Port 1 View  Result Date: 04/07/2019 CLINICAL DATA:  Chest pain. EXAM: PORTABLE CHEST 1 VIEW COMPARISON:  Radiographs of March 15, 2019. FINDINGS: Stable cardiomediastinal silhouette. Status post  coronary bypass graft. No pneumothorax is noted. Right lung is clear. Minimal left basilar subsegmental atelectasis is noted with small pleural effusion. Bony thorax is unremarkable. IMPRESSION: Minimal left basilar subsegmental atelectasis is noted with small left pleural effusion. Electronically Signed   By: Marijo Conception M.D.   On: 04/07/2019 07:37    Cardiac Studies   2D Doppler echocardiogram 04/08/2019 IMPRESSIONS    1. The left ventricle has normal systolic function, with an ejection fraction of 55-60%. The cavity size was normal. Left ventricular diastolic Doppler parameters are consistent with pseudonormalization. No evidence of left ventricular regional wall  motion abnormalities.  2. Normal RV size with mildly decreased systolic function.  3. The aortic valve is tricuspid. Mild calcification of the aortic valve. No stenosis of the aortic valve.  4. The aortic root is normal in size and structure.  5. Left atrial size was mildly dilated.  6. No evidence of mitral  valve stenosis. No significant regurgitation.  7. Normal IVC size. No complete TR doppler jet so unable to estimate PA systolic pressure.   Patient Profile     73 y.o. male history of coronary artery bypass grafting August 2020 with LIMA to LAD, SVG to RCA, and SVG to distal circumflex.  Presented with acute MI complicated by VF arrest in the ambulance bay requiring defibrillation.  Mechanical reperfusion performed using angioplasty in the distal native circumflex.  The occluded saphenous vein graft to the circumflex was not treated.  Very low enzyme rise.  Normal EKG postevent.  Assessment & Plan    1. Acute lateral wall infarction  No post MI complications. 2. Acute ischemic complication with ventricular fibrillation: 8 beat AIVR over night. 3. Hyperlipidemia- target LDL < 70. TG is 187. Recommend VASCEPA 2 grams daily. 4. Insomnia  Hpme today  Will need phase 1 and 2 cardiac rehab  For questions or updates, please contact [place] Please consult [email or web address] for contact info under        Signed, Sinclair Grooms, MD  04/09/2019, 6:08 AM

## 2019-04-09 NOTE — Progress Notes (Signed)
CARDIAC REHAB PHASE I   Reinforced importance of ASA and Brilinta. Encouraged continued ambulation. Pt denies questions or concerns. Pt already participating in Virtual Cardiac Rehab. For d/c today.  [id number] Rufina Falco, RN BSN 04/09/2019 9:54 AM

## 2019-04-09 NOTE — Discharge Summary (Addendum)
The patient has been seen in conjunction with Reino Bellis, NP. All aspects of care have been considered and discussed. The patient has been personally interviewed, examined, and all clinical data has been reviewed.   Please see the note from earlier today.  Access site for interventional procedure reveals no evidence of bleeding.  Plan discharge today with follow-up as outlined.  Discharge Summary    Patient ID: Jared Roberts MRN: [id number]; DOB: 08-27-1945  Admit date: 04/07/2019 Discharge date: 04/09/2019  Primary Care Provider: Deland Pretty, MD  Primary Cardiologist: Mertie Moores, MD   Discharge Diagnoses    Active Problems:   Non-ST elevation (NSTEMI) myocardial infarction [facility])   Ventricular fibrillation [facility])   NSTEMI (non-ST elevated myocardial infarction) ([place])   HTN (hypertension)  Allergies No Known Allergies  Diagnostic Studies/Procedures    LHC 04/07/2019:  1. 3 vessel CAD with severe proximal and mid LAD stenosis, severe proximal RCA stenosis, and total occlusion of the distal circumflex  2. S/P CABG with patency of the LIMA-LAD and SVG-PDA, total occlusion of the SVG-OM-2 3. Normal LV systolic function with normal LVEDP 4. Successful PTCA of the distal circumflex/OM2 with restoration of TIMI-3 flow, vessel too small for stenting, moderate residual stenosis  Recommend: medical therapy, ASA/ticagrelor x 12 months    Echocardiogram 04/07/2019:   1. The left ventricle has normal systolic function, with an ejection fraction of 55-60%. The cavity size was normal. Left ventricular diastolic Doppler parameters are consistent with pseudonormalization. No evidence of left ventricular regional wall  motion abnormalities.  2. Normal RV size with mildly decreased systolic function.  3. The aortic valve is tricuspid. Mild calcification of the aortic valve. No stenosis of the aortic valve.  4. The aortic root is normal in size and structure.  5. Left atrial  size was mildly dilated.  6. No evidence of mitral valve stenosis. No significant regurgitation.  7. Normal IVC size. No complete TR doppler jet so unable to estimate PA systolic pressure. _____________   History of Present Illness     Jared Roberts is a 73 y.o. male with a hx of three vessel CAD s/p CABG 03/12/19 with LIMA-LAD, SVG-OM, SVG-PDA who [facility] on 04/07/2019 via EMS w/ VF arrest. On presentation, pt reported that he was awoken from sleep with mid-sternal/right sided pressure in his chest rated a 4-5 out of 10 in severity w/ associated nausea and diaphoresis. He called EMS and during transport to [facility] he became unresponsive. Monitor per EMS showed VF arrest. He was administered a single shock at 200J and NSR was restored. EKG post-arrest showed possible new Q waves and new TWI in V1-V3 as compared to tracing from 03/13/19.   On ED arrival, he continued to have complaints of chest discomfort similar to pre-arrest discomfort. He was given SL NTG however this dropped his BP and did not relieve his discomfort. He has had minimal relief from fentanyl in the ED. HsT peaked at 6463 (defibrillated) and EKG was noted to be normal post event.    He reported doing well in the post CABG setting until ED presentation. He denied any exertional angina symptoms for the past couple weeks since the surgery. Plan was for urgent cath.   Hospital Course     Jared Roberts was taken to the cath lab on 04/07/2019 which showed three vessel CAD with severe proximal and mid LAD stenosis, severe proximal RCA stenosis, and total occlusion of the distal circumflex s/p CABG with patency of  the LIMA-LAD and SVG-PDA. There was total occlusion of the SVG-OM-2. A successful PTCA of the distal circumflex/OM2 was performed however the vessel was though to be too small for stenting, moderate residual stenosis  Recommendations were for medical therapy with ASA and Brilinta for 12 months duration.     -Acute lateral wall  infarction: Due to occlusion of the saphenous vein graft to the distal circumflex and probable embolization into the obtuse marginal beyond the graft insertion site. Successful mechanical reperfusion with angioplasty only secondary to small vessel size. He had some issues with low back pain in the post procedure setting though to be related to defibrillation for VF. He was started on IV NTG gtt post cath to help with residual ischemic chest and arm pain. IV nitroglycerin was discontinued on 04/08/2019.    Acute ischemic complication with ventricular fibrillation: Treated with defibrillation in transit to [facility] per EMS.  Beta-blocker therapy has been optimized.  Echocardiogram from 04/07/201 confirmed normal LV systolic function and therefore no requirement for AICD. VF was related to acute ischemia which is been resolved.  Essential hypertension: Stable on day of discharge, 137/90  Hyperlipidemia: Cardiac LDL less than 70, last LDL noted to be 67 on 04/08/2019. On low dose statin, noted to be intolerant to higher doses in the past. If LDL becomes uncontrolled, may need lipid clinic referral for possible PCSK( inhibitors.   Plan aggressive preventive therapy: LDL less than 70; blood pressure 130/80 mmHg or less; if elevated triglyceride, consider icosapent ethyl.  He was seen by cardiac rehabilitation who reinforced the importance of ASA and Brilinta therapy. He was noted to have already been participating in [place]. Ambulated without complication. Cath site unremarkable.    Consultants: None   The patient has been seen and examined by Dr. Tamala Julian who feels that he is stable and ready for discharge today, 04/09/2019. Follow up appointment has been scheduled.  _____________  Discharge Vitals Blood pressure 137/90, pulse 63, temperature 97.6 F (36.4 C), temperature source Oral, resp. rate 20, height 5\' 10"  (1.778 m), weight 79 kg, SpO2 95 %.  Filed Weights   04/08/19 0600 04/08/19  2052 04/09/19 0449  Weight: 77.8 kg 77.7 kg 79 kg   Labs & Radiologic Studies    CBC Recent Labs    04/07/19 0359 04/07/19 0431 04/08/19 0300  WBC 14.0*  --  8.2  HGB 13.7 13.3 12.2*  HCT 40.3 39.0 34.3*  MCV 92.9  --  90.3  PLT 245  --  [id number]   Basic Metabolic Panel Recent Labs    04/07/19 0359 04/07/19 0415 04/07/19 0431 04/08/19 0300  NA 138  --  140 138  K 3.5  --  3.5 3.7  CL 109  --  110 111  CO2 16*  --   --  19*  GLUCOSE 169*  --  164* 107*  BUN 15  --  16 10  CREATININE 1.24  --  1.10 0.84  CALCIUM 9.1  --   --  8.7*  MG  --  2.2  --   --     High Sensitivity Troponin:   Recent Labs  Lab 04/07/19 0359 04/07/19 0722 04/08/19 0857  TROPONINIHS 20* 4,481* 6,463*    Fasting Lipid Panel Recent Labs    04/08/19 0300  CHOL 147  HDL 43  LDLCALC 67  TRIG 187*  CHOLHDL 3.4   _____________  Dg Chest 2 View  Result Date: 03/15/2019 CLINICAL DATA:  Postop CABG EXAM: CHEST -  2 VIEW COMPARISON:  03/14/2019 FINDINGS: Small bilateral pleural effusions. Bibasilar atelectasis. There is hyperinflation of the lungs compatible with COPD. Prior CABG. Heart is mildly enlarged. IMPRESSION: COPD. Small bilateral effusions with bibasilar atelectasis. Electronically Signed   By: Rolm Baptise M.D.   On: 03/15/2019 18:42   Dg Chest Port 1 View  Result Date: 04/07/2019 CLINICAL DATA:  Chest pain. EXAM: PORTABLE CHEST 1 VIEW COMPARISON:  Radiographs of March 15, 2019. FINDINGS: Stable cardiomediastinal silhouette. Status post coronary bypass graft. No pneumothorax is noted. Right lung is clear. Minimal left basilar subsegmental atelectasis is noted with small pleural effusion. Bony thorax is unremarkable. IMPRESSION: Minimal left basilar subsegmental atelectasis is noted with small left pleural effusion. Electronically Signed   By: Marijo Conception M.D.   On: 04/07/2019 07:37   Dg Chest Port 1 View  Result Date: 03/14/2019 CLINICAL DATA:  Pleural effusion status post coronary  bypass graft. EXAM: PORTABLE CHEST 1 VIEW COMPARISON:  Radiograph of March 13, 2019. FINDINGS: Stable cardiomegaly. Swan-Ganz catheter has been removed. Right internal jugular venous sheath remains. Left-sided chest tube is unchanged in position without definite pneumothorax. Minimal bibasilar subsegmental atelectasis is noted with minimal pleural effusions. Bony thorax is unremarkable. IMPRESSION: Stable position of left-sided chest tube without pneumothorax. Stable minimal bibasilar subsegmental atelectasis with minimal pleural effusions. Electronically Signed   By: Marijo Conception M.D.   On: 03/14/2019 06:47   Dg Chest Port 1 View  Result Date: 03/13/2019 CLINICAL DATA:  Chest tube, post CABG EXAM: PORTABLE CHEST 1 VIEW COMPARISON:  03/12/2019 FINDINGS: Postoperative changes from CABG. Left chest tube and Swan-Ganz catheter remain in place. The Swan-Ganz catheter has been advanced into the right lower lobe pulmonary artery. Interval extubation and removal of NG tube. Mild cardiomegaly and vascular congestion. Left base atelectasis is similar to prior study. No visible significant effusions. No pneumothorax. IMPRESSION: Slight advancement of the Swan-Ganz catheter into the right lower lobe pulmonary artery. Interval extubation. Cardiomegaly, vascular congestion.  Left base atelectasis. Electronically Signed   By: Rolm Baptise M.D.   On: 03/13/2019 10:27   Dg Chest Port 1 View  Result Date: 03/12/2019 CLINICAL DATA:  Postop day 0 CABG. EXAM: PORTABLE CHEST 1 VIEW COMPARISON:  03/05/2019 and earlier. FINDINGS: Sternotomy for CABG. Endotracheal tube tip in satisfactory position projecting approximately 6 cm above the carina. Nasogastric tube courses below the diaphragm into the stomach. RIGHT jugular Swan-Ganz catheter tip projects over the proximal RIGHT main pulmonary artery. LEFT femoral catheter tip projects over the UPPER heart. Cardiac silhouette normal in size for AP portable technique. Minimal linear  atelectasis in the LEFT mid lung. Mild atelectasis involving the LEFT LOWER LOBE. Lungs otherwise clear. Pulmonary vascularity normal. No visible pleural effusions. LEFT chest tube in place with no pneumothorax. IMPRESSION: 1. Support apparatus satisfactory. 2. Mild linear atelectasis involving the LEFT mid lung and mild LEFT LOWER LOBE atelectasis. No acute cardiopulmonary disease otherwise. Electronically Signed   By: Evangeline Dakin M.D.   On: 03/12/2019 14:48   Disposition   Pt is being discharged home today in good condition.  Follow-up Plans & Appointments    Follow-up Information    Melvin, Crista Luria, [place] Follow up on 04/16/2019.   Specialty: Cardiology Why: Your follow up appointment will be on 04/16/2019 at 830am. Please arrive 15 minutes prior to your appointment time.  Contact information: [address] [place] [address] [phone]          Discharge Instructions  Call MD for:  difficulty breathing, headache or visual disturbances   Complete by: As directed    Call MD for:  extreme fatigue   Complete by: As directed    Call MD for:  hives   Complete by: As directed    Call MD for:  persistant dizziness or light-headedness   Complete by: As directed    Call MD for:  persistant nausea and vomiting   Complete by: As directed    Call MD for:  redness, tenderness, or signs of infection (pain, swelling, redness, odor or green/yellow discharge around incision site)   Complete by: As directed    Call MD for:  severe uncontrolled pain   Complete by: As directed    Call MD for:  temperature >100.4   Complete by: As directed    Diet - low sodium heart healthy   Complete by: As directed    Discharge instructions   Complete by: As directed    PLEASE DO NOT [place]!!!!! Also keep a log of you blood pressures and bring back to your follow up appt. Please call the office with any questions.   Patients taking blood thinners should  generally stay away from medicines like ibuprofen, Advil, Motrin, naproxen, and Aleve due to risk of stomach bleeding. You may take Tylenol as directed or talk to your primary doctor about alternatives.  No driving until seen in the office. No lifting over 5 lbs for 1 week. No sexual activity for 1 week. Keep procedure site clean & dry. If you notice increased pain, swelling, bleeding or pus, call/return!  You may shower, but no soaking baths/hot tubs/pools for 1 week.   Increase activity slowly   Complete by: As directed      Discharge Medications   Allergies as of 04/09/2019   No Known Allergies     Medication List    STOP taking these medications   olmesartan 20 MG tablet Commonly known as: BENICAR Replaced by: irbesartan 150 MG tablet     TAKE these medications   acetaminophen 500 MG tablet Commonly known as: TYLENOL Take 1 tablet (500 mg total) by mouth every 4 (four) hours as needed for mild pain.   ascorbic acid 1000 MG tablet Commonly known as: VITAMIN C Take 1,000 mg by mouth daily.   aspirin 81 MG EC tablet Take 1 tablet (81 mg total) by mouth daily. Start taking on: April 10, 2019 What changed:   medication strength  how much to take   atorvastatin 10 MG tablet Commonly known as: LIPITOR Take 10 mg by mouth at bedtime.   irbesartan 150 MG tablet Commonly known as: AVAPRO Take 1 tablet (150 mg total) by mouth daily. Start taking on: April 10, 2019 Replaces: olmesartan 20 MG tablet   metoprolol tartrate 50 MG tablet Commonly known as: LOPRESSOR Take 1 tablet (50 mg total) by mouth 2 (two) times daily.   multivitamin with minerals Tabs tablet Take 1 tablet by mouth daily.   nitroGLYCERIN 0.4 MG SL tablet Commonly known as: NITROSTAT Place 1 tablet (0.4 mg total) under the tongue every 5 (five) minutes x 3 doses as needed for chest pain.   PROBIOTIC DAILY PO Take 1 capsule by mouth daily.   ticagrelor 90 MG Tabs tablet Commonly known as:  BRILINTA Take 1 tablet (90 mg total) by mouth 2 (two) times daily.   traMADol 50 MG tablet Commonly known as: ULTRAM Take 1 tablet (50 mg total) by  mouth every 4 (four) hours as needed for moderate pain.   Vitamin D 50 MCG (2000 UT) Caps Take 2,000 Units by mouth 2 (two) times a day.        Acute coronary syndrome (MI, NSTEMI, STEMI, etc) this admission?: Yes.     AHA/ACC Clinical Performance & Quality Measures: 4. Aspirin prescribed? - Yes 5. ADP Receptor Inhibitor (Plavix/Clopidogrel, Brilinta/Ticagrelor or Effient/Prasugrel) prescribed (includes medically managed patients)? - Yes 6. Beta Blocker prescribed? - Yes 7. High Intensity Statin (Lipitor 40-80mg  or Crestor 20-40mg ) prescribed? - No - intolerant to higher doses  8. EF assessed during THIS hospitalization? - Yes 9. For EF <40%, was ACEI/ARB prescribed? - Not Applicable (EF >/= [id number]) 10. For EF <40%, Aldosterone Antagonist (Spironolactone or Eplerenone) prescribed? - Not Applicable (EF >/= [id number]) 11. Cardiac Rehab Phase II ordered (Included Medically managed Patients)? - Yes    Outstanding Labs/Studies   BMET   Duration of Discharge Encounter   Greater than 30 minutes including physician time.  Signed, Kathyrn Drown, NP 04/09/2019, 10:40 AM

## 2019-04-10 ENCOUNTER — Telehealth (HOSPITAL_COMMUNITY): Payer: Self-pay

## 2019-04-10 ENCOUNTER — Telehealth (HOSPITAL_COMMUNITY): Payer: Self-pay | Admitting: *Deleted

## 2019-04-10 NOTE — Telephone Encounter (Signed)
Pt insurance is active and benefits verified through [facility]. Co-pay $45.00, DED $0.00/$0.00 met, out of pocket $4,200.00/$2,000.95 met, co-insurance 0%. No pre-authorization required. Mary/[employer], 04/10/2019 @ 413PM, JEH#[id number]  Per Stanton Kidney with [employer] Medicare, once pt has met his OFP in full his $45.00 co-pay will be waived (covered at 100%).  Will contact patient to see if he is interested in the Cardiac Rehab Program. If interested, patient will need to complete follow up appt. Once completed, patient will be contacted for scheduling upon review by the RN Navigator.

## 2019-04-10 NOTE — Telephone Encounter (Signed)
Received second referral notification for this pt who is currently using the app sporadically.  Pt discharged from the hospital after PTCA and VF arrest.  Called and advised pt that due to his recent rehospitalization and VF arrest he was no longer considered appropriate due to high risk catergory for unsupervised exercise.  Pt strongly encouraged to consider the onsite CR.  Pt reluctant before due to concerns for COVID. Reviewed all the measures we are taking to ensure that our pt and staff are safe.  Pt verbalized understanding and is now agreeable. Pt has upcoming appt with cardiology on 9/23 and CVTS on 9/25. Will follow up after follow up compelted for readiness to proceed with CR. Cherre Huger, BSN Cardiac and Training and development officer

## 2019-04-15 NOTE — Progress Notes (Signed)
Cardiology Office Note    Date:  04/16/2019   ID:  EDKER BERGSTEN, DOB 07/01/1946, MRN [id number]  PCP:  Deland Pretty, MD  Cardiologist:  Dr. Acie Fredrickson  Chief Complaint: Hospital follow up   History of Present Illness:   Jared Roberts is a 73 y.o. male with hx of recent CABG followed by admission of VT, HTN, TIA and carotid stenosis presents for hospital follow up.   He recently established care with Dr. Acie Fredrickson in July 2020 for chest pain.  Follow-up of coronary CT was abnormal showing no significant stenosis.[facility] Heart catheterization revealed severe coronary artery disease. Underwent CABG 03/12/19 with LIMA-LAD, SVG-OM, SVG-PDA however presented to [facility] on 04/07/2019 via EMS w/ VF arrest. On presentation, pt reported that he was awoken from sleep with mid-sternal/right sided pressure in his chest rated a 4-5 out of 10 in severity w/ associated nausea and diaphoresis. He called EMS and during transport to [facility] he became unresponsive. Monitor per EMS showed VF arrest. He was administered a single shock at 200J and NSR was restored. EKG post-arrest showed possible new Q waves and new TWI in V1-V3 as compared to tracing from 03/13/19. Jared Roberts was taken to the cath lab on 04/07/2019 which showed occlusion of the saphenous vein graft to the distal circumflex and probable embolization into the obtuse marginal beyond the graft insertion site.Successful mechanical reperfusion with angioplasty only secondary to small vessel size. Echo showed preserved LVEF. VF was related to acute ischemia which is been resolved.  Here today for follow up.  He complains of multiple issues.  He has soreness at surgical site, pain underneath shoulder blade as well as left-sided dull achy chest pressure with was similar prior to his CABG.  No energy.  He has tried tramadol and oxycodone for his pain without improvement.  Chest x-ray during last admission did not show any broken ribs.  Unable to exercise because back and chest  pain.  He might unable to afford Brilinta.  His insurance also does not cover Avapro, previously on Benicar.  Past Medical History:  Diagnosis Date  . Chest discomfort   . Coronary artery disease   . DDD (degenerative disc disease), cervical   . Depression   . Diarrhea   . Dizziness   . DOE (dyspnea on exertion)   . ED (erectile dysfunction) of organic origin   . Fatigue   . Generalized osteoarthritis   . GERD (gastroesophageal reflux disease)   . Hiatal hernia   . History of chronic bronchitis   . History of gastritis    2003  . History of Helicobacter pylori infection    2003  . History of kidney stones   . History of melanoma excision    2015--  right ear  . History of prostate cancer urologist-  dr [name]   2007--  pT2a Nx Mx,  Gleason 3+3, S/P  PROSTATECTOMY  . History of squamous cell carcinoma excision    2011  left knee  . Hx of headache   . Hx of hemorrhoids   . Hyperlipidemia   . Hyperplastic colon polyp 2009  . Hypertension   . Male hypogonadism   . Melanoma ([place])    left 4th toe  . Nephrolithiasis   . Palpitations   . Prostate cancer ([place])   . Rectal bleeding   . Shoulder pain, left   . Skin cancer   . Tinnitus   . Ventricular fibrillation ([place]) 04/07/2019   VF, en route witnessed  by EMS, defibrillated x 1  . Wears glasses     Past Surgical History:  Procedure Laterality Date  . AMPUTATION TOE Left 09/07/2015   Procedure: PARTIAL AMPUTATION TOE 4TH LEFT;  Surgeon: [name], MD;  Location: [place];  Service: Podiatry;  Laterality: Left;  . APPENDECTOMY  1970's  . CORONARY ARTERY BYPASS GRAFT N/A 03/12/2019   Procedure: CORONARY ARTERY BYPASS GRAFTING (CABG) x 3 WITH ENDOSCOPIC HARVESTING OF RIGHT GREATER SAPHENOUS VEIN;  Surgeon: [name], MD;  Location: [place];  Service: Open Heart Surgery;  Laterality: N/A;  . CORONARY BALLOON ANGIOPLASTY N/A 04/07/2019   Procedure: CORONARY BALLOON ANGIOPLASTY;  Surgeon: [name], MD;  Location: [place] [facility];  Service: Cardiovascular;  Laterality: N/A;  . CYSTO/  CLOT EVACUATION POST PROSTATECTOMY  11-19-2005  . CYSTO/  REMOVAL URETHRAL STONE AND URETHRAL STAPLES  06-02-2010  . CYSTO/  URETEROSCOPIC STONE EXTRACTION  X2  1990's  . KIDNEY STONE SURGERY    . LEFT HEART CATH AND CORONARY ANGIOGRAPHY N/A 02/25/2019   Procedure: LEFT HEART CATH AND CORONARY ANGIOGRAPHY;  Surgeon: [name], MD;  Location: [place] [facility];  Service: Cardiovascular;  Laterality: N/A;  . LEFT HEART CATH AND CORS/GRAFTS ANGIOGRAPHY N/A 04/07/2019   Procedure: LEFT HEART CATH AND CORS/GRAFTS ANGIOGRAPHY;  Surgeon: [name], MD;  Location: [place] [facility];  Service: Cardiovascular;  Laterality: N/A;  . MELANOMA EXCISION  2015   right ear  . ROBOT ASSISTED LAPAROSCOPIC RADICAL PROSTATECTOMY  11-09-2005  . SKIN CANCER EXCISION    . TEE WITHOUT CARDIOVERSION N/A 03/12/2019   Procedure: TRANSESOPHAGEAL ECHOCARDIOGRAM (TEE);  Surgeon: [name], MD;  Location: [place];  Service: Open Heart Surgery;  Laterality: N/A;  . TONSILLECTOMY  child    Current Medications: Prior to Admission medications   Medication Sig Start Date End Date Taking? Authorizing Provider  acetaminophen (TYLENOL) 500 MG tablet Take 1 tablet (500 mg total) by mouth every 4 (four) hours as needed for mild pain. 03/17/19   Nani Skillern, PA-C  ascorbic acid (VITAMIN C) 1000 MG tablet Take 1,000 mg by mouth daily.    [provider]  aspirin EC 81 MG EC tablet Take 1 tablet (81 mg total) by mouth daily. 04/10/19   Kathyrn Drown D, NP  atorvastatin (LIPITOR) 10 MG tablet Take 10 mg by mouth at bedtime.  01/20/19   [provider]  Cholecalciferol (VITAMIN D) 2000 units CAPS Take 2,000 Units by mouth 2 (two) times a day.     [provider]  irbesartan (AVAPRO) 150 MG tablet Take 1 tablet (150 mg total) by mouth daily. 04/10/19   Tommie Raymond, NP  metoprolol  tartrate (LOPRESSOR) 50 MG tablet Take 1 tablet (50 mg total) by mouth 2 (two) times daily. 03/26/19   Nahser, Wonda Cheng, MD  Multiple Vitamin (MULTIVITAMIN WITH MINERALS) TABS tablet Take 1 tablet by mouth daily.    [provider]  nitroGLYCERIN (NITROSTAT) 0.4 MG SL tablet Place 1 tablet (0.4 mg total) under the tongue every 5 (five) minutes x 3 doses as needed for chest pain. 04/09/19   Kathyrn Drown D, NP  Probiotic Product (PROBIOTIC DAILY PO) Take 1 capsule by mouth daily.     [provider]  ticagrelor (BRILINTA) 90 MG TABS tablet Take 1 tablet (90 mg total) by mouth 2 (two) times daily. 04/09/19   Tommie Raymond, NP  traMADol (ULTRAM) 50 MG tablet Take 1  tablet (50 mg total) by mouth every 4 (four) hours as needed for moderate pain. 03/17/19   Nani Skillern, PA-C    Allergies:   Patient has no known allergies.   Social History   Socioeconomic History  . Marital status: Married    Spouse name: Not on file  . Number of children: Not on file  . Years of education: Not on file  . Highest education level: Not on file  Occupational History  . Not on file  Social Needs  . Financial resource strain: Not on file  . Food insecurity    Worry: Not on file    Inability: Not on file  . Transportation needs    Medical: Not on file    Non-medical: Not on file  Tobacco Use  . Smoking status: Never Smoker  . Smokeless tobacco: Never Used  Substance and Sexual Activity  . Alcohol use: Yes    Comment: 1 drink daily  . Drug use: No  . Sexual activity: Not on file    Comment: retired, married. 1 son  Lifestyle  . Physical activity    Days per week: Not on file    Minutes per session: Not on file  . Stress: Not on file  Relationships  . Social Herbalist on phone: Not on file    Gets together: Not on file    Attends religious service: Not on file    Active member of club or organization: Not on file    Attends meetings of clubs or organizations: Not  on file    Relationship status: Not on file  Other Topics Concern  . Not on file  Social History Narrative  . Not on file     Family History:  The patient's family history includes Arthritis in his mother; Congestive Heart Failure in his mother; Diabetes Mellitus II in his mother; Osteoarthritis in his mother; Other in his father.   ROS:   Please see the history of present illness.    ROS All other systems reviewed and are negative.   PHYSICAL EXAM:   VS:  BP 128/88   Pulse 97   Ht 5\' 10"  (1.778 m)   Wt 173 lb (78.5 kg)   SpO2 98%   BMI 24.82 kg/m    GEN: Well nourished, well developed, in no acute distress  HEENT: normal  Neck: no JVD, carotid bruits, or masses Cardiac: RRR; no murmurs, rubs, or gallops,no edema, surgical scar Respiratory:  clear to auscultation bilaterally, normal work of breathing GI: soft, nontender, nondistended, + BS MS: no deformity or atrophy  Skin: warm and dry, no rash Neuro:  Alert and Oriented x 3, Strength and sensation are intact Psych: euthymic mood, full affect  Wt Readings from Last 3 Encounters:  04/16/19 173 lb (78.5 kg)  04/09/19 174 lb 3.2 oz (79 kg)  03/26/19 172 lb (78 kg)      Studies/Labs Reviewed:   EKG:  EKG is not ordered today.    Recent Labs: 03/05/2019: ALT 20 04/07/2019: Magnesium 2.2 04/08/2019: BUN 10; Creatinine, Ser 0.84; Hemoglobin 12.2; Platelets 172; Potassium 3.7; Sodium 138   Lipid Panel    Component Value Date/Time   CHOL 147 04/08/2019 0300   TRIG 187 (H) 04/08/2019 0300   HDL 43 04/08/2019 0300   CHOLHDL 3.4 04/08/2019 0300   VLDL 37 04/08/2019 0300   LDLCALC 67 04/08/2019 0300    Additional studies/ records that were reviewed today include:  LHC 04/07/2019:  1. 3 vessel CAD with severe proximal and mid LAD stenosis, severe proximal RCA stenosis, and total occlusion of the distal circumflex  2. S/P CABG with patency of the LIMA-LAD and SVG-PDA, total occlusion of the SVG-OM-2 3. Normal LV  systolic function with normal LVEDP 4. Successful PTCA of the distal circumflex/OM2 with restoration of TIMI-3 flow, vessel too small for stenting, moderate residual stenosis  Recommend: medical therapy, ASA/ticagrelor x 12 months    Echocardiogram 04/07/2019:  1. The left ventricle has normal systolic function, with an ejection fraction of 55-60%. The cavity size was normal. Left ventricular diastolic Doppler parameters are consistent with pseudonormalization. No evidence of left ventricular regional wall  motion abnormalities. 2. Normal RV size with mildly decreased systolic function. 3. The aortic valve is tricuspid. Mild calcification of the aortic valve. No stenosis of the aortic valve. 4. The aortic root is normal in size and structure. 5. Left atrial size was mildly dilated. 6. No evidence of mitral valve stenosis. No significant regurgitation. 7. Normal IVC size. No complete TR doppler jet so unable to estimate PA systolic pressure.  ASSESSMENT & PLAN:   1.  Chest pain/shoulder blade pain -He has mixed symptoms of musculoskeletal as well as angina and healing chest wall pain.  No improvement on tramadol and oxycodone.  Will give Flexeril.  Increase metoprolol to 75 mg twice daily for antianginal therapy and also lower his heart rate.  2.CAD s/p CABG 03/12/2019 and successful PTCA of the distal circumflex/OM2 04/07/2019 2nd to total occlusion of the SVG-OM-2.  -Continue aspirin, Brilinta (hi copay is $400>> he will try to apply for assistant program, if not able to afford change to Plavix with loading dose).  As above  3. VT 2nd to acute lateral wall infraction -No palpitation.  Continue higher dose of metoprolol as above.  4. HTN -Blood pressure normal. -Increase beta-blocker as above. -Change Avapro to Benicar due to insurance coverage  5.  HLD -Continue high intensity statin. - 04/08/2019: Cholesterol 147; HDL 43; LDL Cholesterol 67; Triglycerides 187; VLDL 37     Medication Adjustments/Labs and Tests Ordered: Current medicines are reviewed at length with the patient today.  Concerns regarding medicines are outlined above.  Medication changes, Labs and Tests ordered today are listed in the Patient Instructions below. Patient Instructions  Medication Instructions:   Your physician has recommended you make the following change in your medication:   1) Stop Avapro  2) Increase Metoprolol to 75MG , 1 1/2 tablets by mouth twice a day 3) Start Olmesartan(Benicar) 20MG , 1 tablet by mouth once a day  If you need a refill on your cardiac medications before your next appointment, please call your pharmacy.   Lab work:  None ordered today  Testing/Procedures:  None ordered today  Follow-Up:   Keep follow up on 05/08/19 at 11:20AM with Mertie Moores, MD      Signed, Leanor Kail, [place]  04/16/2019 9:39 AM    [address], [place], [place]  [address] Phone: [phone]; Fax: [phone]

## 2019-04-16 ENCOUNTER — Encounter: Payer: Self-pay | Admitting: Physician Assistant

## 2019-04-16 ENCOUNTER — Other Ambulatory Visit: Payer: Self-pay

## 2019-04-16 ENCOUNTER — Ambulatory Visit: Payer: Medicare HMO | Admitting: Physician Assistant

## 2019-04-16 VITALS — BP 128/88 | HR 97 | Ht 70.0 in | Wt 173.0 lb

## 2019-04-16 DIAGNOSIS — R0789 Other chest pain: Secondary | ICD-10-CM

## 2019-04-16 DIAGNOSIS — I119 Hypertensive heart disease without heart failure: Secondary | ICD-10-CM | POA: Diagnosis not present

## 2019-04-16 DIAGNOSIS — E785 Hyperlipidemia, unspecified: Secondary | ICD-10-CM | POA: Diagnosis not present

## 2019-04-16 DIAGNOSIS — I25700 Atherosclerosis of coronary artery bypass graft(s), unspecified, with unstable angina pectoris: Secondary | ICD-10-CM

## 2019-04-16 MED ORDER — METOPROLOL TARTRATE 50 MG PO TABS: 75.0000 mg | ORAL_TABLET | Freq: Two times a day (BID) | ORAL | 3 refills | Status: DC

## 2019-04-16 MED ORDER — CYCLOBENZAPRINE HCL 10 MG PO TABS: 10.0000 mg | ORAL_TABLET | Freq: Three times a day (TID) | ORAL | 0 refills | Status: DC | PRN

## 2019-04-16 MED ORDER — OLMESARTAN MEDOXOMIL 20 MG PO TABS: 20.0000 mg | ORAL_TABLET | Freq: Every day | ORAL | 3 refills | Status: DC

## 2019-04-16 NOTE — Patient Instructions (Addendum)
Medication Instructions:   Your physician has recommended you make the following change in your medication:   1) Stop Avapro  2) Increase Metoprolol to 75MG , 1 1/2 tablets by mouth twice a day 3) Start Olmesartan(Benicar) 20MG , 1 tablet by mouth once a day  If you need a refill on your cardiac medications before your next appointment, please call your pharmacy.   Lab work:  None ordered today  Testing/Procedures:  None ordered today  Follow-Up:   Keep follow up on 05/08/19 at 11:20AM with Mertie Moores, MD

## 2019-04-18 ENCOUNTER — Other Ambulatory Visit: Payer: Self-pay

## 2019-04-18 ENCOUNTER — Encounter: Payer: Self-pay | Admitting: Thoracic Surgery (Cardiothoracic Vascular Surgery)

## 2019-04-18 ENCOUNTER — Ambulatory Visit (INDEPENDENT_AMBULATORY_CARE_PROVIDER_SITE_OTHER): Payer: Self-pay | Admitting: Thoracic Surgery (Cardiothoracic Vascular Surgery)

## 2019-04-18 VITALS — BP 126/90 | HR 80 | Temp 97.3°F | Resp 16 | Ht 70.0 in | Wt 173.0 lb

## 2019-04-18 DIAGNOSIS — Z951 Presence of aortocoronary bypass graft: Secondary | ICD-10-CM

## 2019-04-18 DIAGNOSIS — I25119 Atherosclerotic heart disease of native coronary artery with unspecified angina pectoris: Secondary | ICD-10-CM

## 2019-04-18 NOTE — Progress Notes (Signed)
      [address]       [place] [address]             [id number]        On[facility] C [facility] [place] Medical Record [id number] Date of Birth: 01-09-1946  Referring: Nahser, Wonda Cheng, MD Primary Care: Deland Pretty, MD Primary Cardiologist:Philip Nahser, MD  Reason for visit:   follow-up  History of Present Illness:     Mr. Brokenshire comes for his second follow-up appointment after undergoing a three-vessel CABG.  Of note he was recently admitted after EMS brought him to the hospital for new onset chest pain.  He went into ventricular fibrillation on route and was shocked once.  Left heart cath revealed that the graft to the lateral wall target was occluded.  PCI was performed to reopen the the native vessel but it was a small vessel with a small distribution.  Since being discharged from the hospital he does admit having some high blood pressures with diastolics in the [id number].  He was recently seen by his cardiologist and they have gone up on his metoprolol as well as his Benicar.  He does feel slightly more fatigued given everything that is happened.  He also complains of some incisional paresthesias.  Physical Exam: BP 126/90 (BP Location: Right Arm, Patient Position: Sitting, Cuff Size: Normal)   Pulse 80   Temp (!) 97.3 F (36.3 C)   Resp 16   Ht 5\' 10"  (1.778 m)   Wt 173 lb (78.5 kg)   SpO2 97% Comment: RA  BMI 24.82 kg/m   Alert NAD RRR, no murmur.  Incision clean.  Sternum stable Abdomen soft, NT/ND no peripheral edema       Assessment / Plan:   73 year old male status post three-vessel CABG with a known occluded circumflex graft.  The native vessel was small in caliber and covered a small area.  His heart function was not compromised by this occlusion.  Medical management has been recommended for the lateral wall. Given that he that he has been having nightly elevated blood pressures, I do not think that he is quite ready for cardiac rehab until this is  been addressed.  He will follow-up with his cardiologist in 2weeks for continued medical management.   [name] 04/18/2019 3:02 PM

## 2019-04-19 ENCOUNTER — Inpatient Hospital Stay (HOSPITAL_COMMUNITY)
Admission: EM | Admit: 2019-04-19 | Discharge: 2019-04-22 | DRG: 247 | Disposition: A | Discharge status: Home / Self Care | Payer: Medicare HMO | Attending: Cardiology | Admitting: Cardiology

## 2019-04-19 ENCOUNTER — Encounter (HOSPITAL_COMMUNITY): Payer: Self-pay | Admitting: *Deleted

## 2019-04-19 ENCOUNTER — Emergency Department (HOSPITAL_COMMUNITY): Payer: Medicare HMO

## 2019-04-19 DIAGNOSIS — I2582 Chronic total occlusion of coronary artery: Secondary | ICD-10-CM | POA: Diagnosis not present

## 2019-04-19 DIAGNOSIS — I10 Essential (primary) hypertension: Secondary | ICD-10-CM | POA: Diagnosis present

## 2019-04-19 DIAGNOSIS — Z8261 Family history of arthritis: Secondary | ICD-10-CM | POA: Diagnosis not present

## 2019-04-19 DIAGNOSIS — M5030 Other cervical disc degeneration, unspecified cervical region: Secondary | ICD-10-CM | POA: Diagnosis not present

## 2019-04-19 DIAGNOSIS — Z7982 Long term (current) use of aspirin: Secondary | ICD-10-CM | POA: Diagnosis not present

## 2019-04-19 DIAGNOSIS — M159 Polyosteoarthritis, unspecified: Secondary | ICD-10-CM | POA: Diagnosis present

## 2019-04-19 DIAGNOSIS — Z85828 Personal history of other malignant neoplasm of skin: Secondary | ICD-10-CM | POA: Diagnosis not present

## 2019-04-19 DIAGNOSIS — R0789 Other chest pain: Secondary | ICD-10-CM | POA: Diagnosis not present

## 2019-04-19 DIAGNOSIS — I214 Non-ST elevation (NSTEMI) myocardial infarction: Secondary | ICD-10-CM | POA: Diagnosis not present

## 2019-04-19 DIAGNOSIS — Z79899 Other long term (current) drug therapy: Secondary | ICD-10-CM | POA: Diagnosis not present

## 2019-04-19 DIAGNOSIS — Z955 Presence of coronary angioplasty implant and graft: Secondary | ICD-10-CM

## 2019-04-19 DIAGNOSIS — K219 Gastro-esophageal reflux disease without esophagitis: Secondary | ICD-10-CM | POA: Diagnosis present

## 2019-04-19 DIAGNOSIS — G4733 Obstructive sleep apnea (adult) (pediatric): Secondary | ICD-10-CM | POA: Diagnosis present

## 2019-04-19 DIAGNOSIS — I2510 Atherosclerotic heart disease of native coronary artery without angina pectoris: Secondary | ICD-10-CM | POA: Diagnosis present

## 2019-04-19 DIAGNOSIS — I25810 Atherosclerosis of coronary artery bypass graft(s) without angina pectoris: Secondary | ICD-10-CM | POA: Diagnosis not present

## 2019-04-19 DIAGNOSIS — Z20828 Contact with and (suspected) exposure to other viral communicable diseases: Secondary | ICD-10-CM | POA: Diagnosis present

## 2019-04-19 DIAGNOSIS — Z85820 Personal history of malignant melanoma of skin: Secondary | ICD-10-CM | POA: Diagnosis not present

## 2019-04-19 DIAGNOSIS — Z87442 Personal history of urinary calculi: Secondary | ICD-10-CM | POA: Diagnosis not present

## 2019-04-19 DIAGNOSIS — Z951 Presence of aortocoronary bypass graft: Secondary | ICD-10-CM

## 2019-04-19 DIAGNOSIS — E785 Hyperlipidemia, unspecified: Secondary | ICD-10-CM | POA: Diagnosis not present

## 2019-04-19 DIAGNOSIS — Z8249 Family history of ischemic heart disease and other diseases of the circulatory system: Secondary | ICD-10-CM

## 2019-04-19 DIAGNOSIS — Z89422 Acquired absence of other left toe(s): Secondary | ICD-10-CM

## 2019-04-19 DIAGNOSIS — I252 Old myocardial infarction: Secondary | ICD-10-CM | POA: Diagnosis not present

## 2019-04-19 DIAGNOSIS — Z7902 Long term (current) use of antithrombotics/antiplatelets: Secondary | ICD-10-CM | POA: Diagnosis not present

## 2019-04-19 DIAGNOSIS — I200 Unstable angina: Secondary | ICD-10-CM | POA: Diagnosis present

## 2019-04-19 DIAGNOSIS — R079 Chest pain, unspecified: Secondary | ICD-10-CM

## 2019-04-19 DIAGNOSIS — Z9861 Coronary angioplasty status: Secondary | ICD-10-CM | POA: Diagnosis not present

## 2019-04-19 DIAGNOSIS — Z9079 Acquired absence of other genital organ(s): Secondary | ICD-10-CM | POA: Diagnosis not present

## 2019-04-19 DIAGNOSIS — Z8546 Personal history of malignant neoplasm of prostate: Secondary | ICD-10-CM | POA: Diagnosis not present

## 2019-04-19 LAB — BASIC METABOLIC PANEL
Anion gap: 9 (ref 5–15)
BUN: 14 mg/dL (ref 8–23)
CO2: 21 mmol/L — ABNORMAL LOW (ref 22–32)
Calcium: 9.8 mg/dL (ref 8.9–10.3)
Chloride: 110 mmol/L (ref 98–111)
Creatinine, Ser: 1.12 mg/dL (ref 0.61–1.24)
GFR calc Af Amer: >60 mL/min (ref >60)
GFR calc non Af Amer: >60 mL/min (ref >60)
Glucose, Bld: 116 mg/dL — ABNORMAL HIGH (ref 70–99)
Potassium: 4.1 mmol/L (ref 3.5–5.1)
Sodium: 140 mmol/L (ref 135–145)

## 2019-04-19 LAB — CBC
HCT: 45 % (ref 39.0–52.0)
Hemoglobin: 15.3 g/dL (ref 13.0–17.0)
MCH: 30.5 pg (ref 26.0–34.0)
MCHC: 34 g/dL (ref 30.0–36.0)
MCV: 89.8 fL (ref 80.0–100.0)
Platelets: 286 10*3/uL (ref 150–400)
RBC: 5.01 MIL/uL (ref 4.22–5.81)
RDW: 12.4 % (ref 11.5–15.5)
WBC: 10 10*3/uL (ref 4.0–10.5)
nRBC: 0 % (ref 0.0–0.2)

## 2019-04-19 LAB — TROPONIN I (HIGH SENSITIVITY)
Troponin I (High Sensitivity): 21 ng/L — ABNORMAL HIGH (ref <18)
Troponin I (High Sensitivity): 38 ng/L — ABNORMAL HIGH (ref <18)

## 2019-04-19 LAB — SARS CORONAVIRUS 2 (TAT 6-24 HRS): SARS Coronavirus 2: NEGATIVE

## 2019-04-19 MED ORDER — ATORVASTATIN CALCIUM 10 MG PO TABS
10.0000 mg | ORAL_TABLET | Freq: Every day | ORAL | Status: DC
  Administered 2019-04-19 – 2019-04-21 (×3): 10 mg via ORAL
  Filled 2019-04-19 (×3): qty 1

## 2019-04-19 MED ORDER — METOPROLOL TARTRATE 50 MG PO TABS
75.0000 mg | ORAL_TABLET | Freq: Two times a day (BID) | ORAL | Status: DC
  Administered 2019-04-19 – 2019-04-22 (×6): 75 mg via ORAL
  Filled 2019-04-19 (×5): qty 1
  Filled 2019-04-19: qty 3
  Filled 2019-04-19: qty 1

## 2019-04-19 MED ORDER — NITROGLYCERIN 0.4 MG SL SUBL: 0.4000 mg | SUBLINGUAL_TABLET | SUBLINGUAL | Status: DC | PRN

## 2019-04-19 MED ORDER — HEPARIN SODIUM (PORCINE) 5000 UNIT/ML IJ SOLN: 5000.0000 [IU] | Freq: Three times a day (TID) | INTRAMUSCULAR | Status: DC

## 2019-04-19 MED ORDER — HEPARIN BOLUS VIA INFUSION
4000.0000 [IU] | Freq: Once | INTRAVENOUS | Status: AC
  Administered 2019-04-19: 4000 [IU] via INTRAVENOUS
  Filled 2019-04-19: qty 4000

## 2019-04-19 MED ORDER — SODIUM CHLORIDE 0.9% FLUSH
3.0000 mL | Freq: Once | INTRAVENOUS | Status: AC
  Administered 2019-04-19: 3 mL via INTRAVENOUS

## 2019-04-19 MED ORDER — HEPARIN (PORCINE) 25000 UT/250ML-% IV SOLN
1000.0000 [IU]/h | INTRAVENOUS | Status: DC
  Administered 2019-04-19 – 2019-04-20 (×2): 1000 [IU]/h via INTRAVENOUS
  Filled 2019-04-19 (×2): qty 250

## 2019-04-19 MED ORDER — TICAGRELOR 90 MG PO TABS
90.0000 mg | ORAL_TABLET | Freq: Two times a day (BID) | ORAL | Status: DC
  Administered 2019-04-19 – 2019-04-22 (×7): 90 mg via ORAL
  Filled 2019-04-19 (×7): qty 1

## 2019-04-19 MED ORDER — ASPIRIN EC 81 MG PO TBEC: 81.0000 mg | DELAYED_RELEASE_TABLET | Freq: Every day | ORAL | Status: DC

## 2019-04-19 MED ORDER — IRBESARTAN 150 MG PO TABS
150.0000 mg | ORAL_TABLET | Freq: Every day | ORAL | Status: DC
  Administered 2019-04-19 – 2019-04-22 (×4): 150 mg via ORAL
  Filled 2019-04-19 (×4): qty 1

## 2019-04-19 MED ORDER — NITROGLYCERIN IN D5W 200-5 MCG/ML-% IV SOLN
0.0000 ug/min | INTRAVENOUS | Status: DC
  Administered 2019-04-19: 5 ug/min via INTRAVENOUS
  Filled 2019-04-19: qty 250

## 2019-04-19 MED ORDER — ACETAMINOPHEN 325 MG PO TABS
650.0000 mg | ORAL_TABLET | ORAL | Status: DC | PRN
  Administered 2019-04-22 (×2): 650 mg via ORAL
  Filled 2019-04-19 (×2): qty 2

## 2019-04-19 MED ORDER — ASPIRIN EC 81 MG PO TBEC
81.0000 mg | DELAYED_RELEASE_TABLET | Freq: Every day | ORAL | Status: DC
  Administered 2019-04-19 – 2019-04-22 (×3): 81 mg via ORAL
  Filled 2019-04-19 (×3): qty 1

## 2019-04-19 NOTE — ED Provider Notes (Signed)
[place] EMERGENCY DEPARTMENT Provider Note   CSN: [id number] Arrival date & time: 04/19/19  [id number]     History   Chief Complaint Chief Complaint  Patient presents with   Chest Pain    HPI LONZIE TORAN is a 73 y.o. male.    73 year old male with chest pain.  Onset this morning.  Describes pain/pressure in the center to right chest.  Patient with recent significant history as outlined below.  He states that this chest pain is reminiscent of what to experience on 9/14 although not quite as intense.  Currently much improved to the point of resolution.  He reports compliance with his medications.  He reports that he had been recovering well from his perspective since his discharge from the hospital up until his symptoms this morning.   Underwent CABG 03/12/19 withLIMA-LAD, SVG-OM, SVG-PDA however presented to [facility] on 09/14/2020via EMS w/ VF arrest.On presentation, pt reportedthat he was awoken from sleep with mid-sternal/right sidedpressure in his chestrated a4-5out of10in severityw/ associated nausea and diaphoresis. He called EMS and during transport to [facility] unresponsive. Monitorper EMSshowed VF arrest. He was administered a single shock at 200J and NSR was restored. EKG post-arrest showedpossible new Q waves and new TWI in V1-V3 as compared to tracing from 03/13/19.Mr. Agerton was taken to the cath lab on 04/07/2019 which showed occlusion of the saphenous vein graft to the distal circumflex and probable embolization into the obtuse marginal beyond the graft insertion site.Successful mechanical reperfusion with angioplasty onlysecondary to smallvessel size. Echo showed preserved LVEF   Past Medical History:  Diagnosis Date   Chest discomfort    Coronary artery disease    DDD (degenerative disc disease), cervical    Depression    Diarrhea    Dizziness    DOE (dyspnea on exertion)    ED (erectile dysfunction) of organic origin     Fatigue    Generalized osteoarthritis    GERD (gastroesophageal reflux disease)    Hiatal hernia    History of chronic bronchitis    History of gastritis    [id number]   History of Helicobacter pylori infection    2003   History of kidney stones    History of melanoma excision    2015--  right ear   History of prostate cancer urologist-  dr [name]   2007--  pT2a Nx Mx,  Gleason 3+3, S/P  PROSTATECTOMY   History of squamous cell carcinoma excision    2011  left knee   Hx of headache    Hx of hemorrhoids    Hyperlipidemia    Hyperplastic colon polyp 2009   Hypertension    Male hypogonadism    Melanoma ([place])    left 4th toe   Nephrolithiasis    Palpitations    Prostate cancer ([place])    Rectal bleeding    Shoulder pain, left    Skin cancer    Tinnitus    Ventricular fibrillation ([place]) 04/07/2019   VF, en route witnessed by EMS, defibrillated x 1   Wears glasses     Patient Active Problem List   Diagnosis Date Noted   HTN (hypertension) 04/09/2019   Non-ST elevation (NSTEMI) myocardial infarction ([place]) 04/07/2019   Ventricular fibrillation ([place]) 04/07/2019   NSTEMI (non-ST elevated myocardial infarction) ([place]) 04/07/2019   S/P CABG x 3 03/12/2019   Coronary artery disease 03/12/2019   Effort angina ([place])    Chest pain of uncertain etiology [id number]   Benign essential HTN 01/31/2019  Mild obstructive sleep apnea 04/17/2018   Postviral fatigue syndrome 03/27/2018   Snoring 03/27/2018   Excessive daytime sleepiness 03/27/2018   Confusion 02/13/2018   Malignant melanoma of toe of left foot ([place]) 09/20/2015   History of prostate cancer 09/20/2015    Past Surgical History:  Procedure Laterality Date   AMPUTATION TOE Left 09/07/2015   Procedure: PARTIAL AMPUTATION TOE 4TH LEFT;  Surgeon: [name], MD;  Location: [place];  Service: Podiatry;  Laterality: Left;   APPENDECTOMY  1970's   CORONARY ARTERY BYPASS  GRAFT N/A 03/12/2019   Procedure: CORONARY ARTERY BYPASS GRAFTING (CABG) x 3 WITH ENDOSCOPIC HARVESTING OF RIGHT GREATER SAPHENOUS VEIN;  Surgeon: [name], MD;  Location: [place];  Service: Open Heart Surgery;  Laterality: N/A;   CORONARY BALLOON ANGIOPLASTY N/A 04/07/2019   Procedure: CORONARY BALLOON ANGIOPLASTY;  Surgeon: [name], MD;  Location: [place] [facility];  Service: Cardiovascular;  Laterality: N/A;   CYSTO/  CLOT EVACUATION POST PROSTATECTOMY  11-19-2005   CYSTO/  REMOVAL URETHRAL STONE AND URETHRAL STAPLES  06-02-2010   CYSTO/  URETEROSCOPIC STONE EXTRACTION  X2  1990's   KIDNEY STONE SURGERY     LEFT HEART CATH AND CORONARY ANGIOGRAPHY N/A 02/25/2019   Procedure: LEFT HEART CATH AND CORONARY ANGIOGRAPHY;  Surgeon: [name], MD;  Location: [place] [facility];  Service: Cardiovascular;  Laterality: N/A;   LEFT HEART CATH AND CORS/GRAFTS ANGIOGRAPHY N/A 04/07/2019   Procedure: LEFT HEART CATH AND CORS/GRAFTS ANGIOGRAPHY;  Surgeon: [name], MD;  Location: [place] [facility];  Service: Cardiovascular;  Laterality: N/A;   MELANOMA EXCISION  2015   right ear   ROBOT ASSISTED LAPAROSCOPIC RADICAL PROSTATECTOMY  11-09-2005   SKIN CANCER EXCISION     TEE WITHOUT CARDIOVERSION N/A 03/12/2019   Procedure: TRANSESOPHAGEAL ECHOCARDIOGRAM (TEE);  Surgeon: [name], MD;  Location: [place];  Service: Open Heart Surgery;  Laterality: N/A;   TONSILLECTOMY  child        Home Medications    Prior to Admission medications   Medication Sig Start Date End Date Taking? Authorizing Provider  acetaminophen (TYLENOL) 500 MG tablet Take 1 tablet (500 mg total) by mouth every 4 (four) hours as needed for mild pain. 03/17/19   Nani Skillern, PA-C  ascorbic acid (VITAMIN C) 1000 MG tablet Take 1,000 mg by mouth daily.    [provider]  aspirin EC 81 MG EC tablet Take 1 tablet (81 mg total) by mouth daily. 04/10/19   Kathyrn Drown D,  NP  atorvastatin (LIPITOR) 10 MG tablet Take 10 mg by mouth at bedtime.  01/20/19   [provider]  Cholecalciferol (VITAMIN D) 2000 units CAPS Take 2,000 Units by mouth 2 (two) times a day.     [provider]  cyclobenzaprine (FLEXERIL) 10 MG tablet Take 1 tablet (10 mg total) by mouth 3 (three) times daily as needed for muscle spasms. 04/16/19   Bhagat, Crista Luria, PA  metoprolol tartrate (LOPRESSOR) 50 MG tablet Take 1.5 tablets (75 mg total) by mouth 2 (two) times daily. 04/16/19   Leanor Kail, PA  Multiple Vitamin (MULTIVITAMIN WITH MINERALS) TABS tablet Take 1 tablet by mouth daily.    [provider]  nitroGLYCERIN (NITROSTAT) 0.4 MG SL tablet Place 1 tablet (0.4 mg total) under the tongue every 5 (five) minutes x 3 doses as needed for chest pain. 04/09/19   Tommie Raymond, NP  olmesartan (BENICAR) 20 MG tablet Take  1 tablet (20 mg total) by mouth daily. 04/16/19   Leanor Kail, PA  Probiotic Product (PROBIOTIC DAILY PO) Take 1 capsule by mouth daily.     [provider]  ticagrelor (BRILINTA) 90 MG TABS tablet Take 1 tablet (90 mg total) by mouth 2 (two) times daily. 04/09/19   Tommie Raymond, NP  traMADol (ULTRAM) 50 MG tablet Take 1 tablet (50 mg total) by mouth every 4 (four) hours as needed for moderate pain. 03/17/19   Nani Skillern, PA-C    Family History Family History  Problem Relation Age of Onset   Arthritis Mother    Diabetes Mellitus II Mother    Congestive Heart Failure Mother    Osteoarthritis Mother        CHRONIC   Other Father        KILLED IN WWII    Social History Social History   Tobacco Use   Smoking status: Never Smoker   Smokeless tobacco: Never Used  Substance Use Topics   Alcohol use: Yes    Comment: 1 drink daily   Drug use: No     Allergies   Patient has no known allergies.   Review of Systems Review of Systems All systems reviewed and negative, other than as noted in  HPI.   Physical Exam Updated Vital Signs BP (!) 126/93    Pulse 95    Temp 98.5 F (36.9 C) (Oral)    Resp 20    Ht 5\' 10"  (1.778 m)    Wt 77.1 kg    SpO2 97%    BMI 24.39 kg/m   Physical Exam Vitals signs and nursing note reviewed.  Constitutional:      General: He is not in acute distress.    Appearance: He is well-developed.  HENT:     Head: Normocephalic and atraumatic.  Eyes:     General:        Right eye: No discharge.        Left eye: No discharge.     Conjunctiva/sclera: Conjunctivae normal.  Neck:     Musculoskeletal: Neck supple.  Cardiovascular:     Rate and Rhythm: Normal rate and regular rhythm.     Heart sounds: Normal heart sounds. No murmur. No friction rub. No gallop.   Pulmonary:     Effort: Pulmonary effort is normal. No respiratory distress.     Breath sounds: Normal breath sounds.  Abdominal:     General: There is no distension.     Palpations: Abdomen is soft.     Tenderness: There is no abdominal tenderness.  Musculoskeletal:        General: No tenderness.  Skin:    General: Skin is warm and dry.  Neurological:     Mental Status: He is alert.  Psychiatric:        Behavior: Behavior normal.        Thought Content: Thought content normal.      ED Treatments / Results  Labs (all labs ordered are listed, but only abnormal results are displayed) Labs Reviewed  BASIC METABOLIC PANEL - Abnormal; Notable for the following components:      Result Value   CO2 21 (*)    Glucose, Bld 116 (*)    All other components within normal limits  TROPONIN I (HIGH SENSITIVITY) - Abnormal; Notable for the following components:   Troponin I (High Sensitivity) 21 (*)    All other components within normal limits  CBC  TROPONIN  I (HIGH SENSITIVITY)    EKG EKG Interpretation  Date/Time:  Saturday April 19 2019 08:38:09 EDT Ventricular Rate:  109 PR Interval:  132 QRS Duration: 70 QT Interval:  322 QTC Calculation: 433 R Axis:   85 Text  Interpretation:  Sinus tachycardia Cannot rule out Anterior infarct , age undetermined Abnormal ECG Confirmed by Virgel Manifold [phone]) on 04/19/2019 9:16:08 AM   Radiology Dg Chest 2 View  Result Date: 04/19/2019 CLINICAL DATA:  Chest pain. EXAM: CHEST - 2 VIEW COMPARISON:  Radiograph of April 07, 2019. FINDINGS: The heart size and mediastinal contours are within normal limits. Right lung is clear. Minimal left basilar subsegmental atelectasis or scarring is noted. Status post coronary bypass graft. No pneumothorax or pleural effusion is noted. The visualized skeletal structures are unremarkable. IMPRESSION: Minimal left basilar subsegmental atelectasis or scarring. Electronically Signed   By: Marijo Conception M.D.   On: 04/19/2019 10:46    Procedures Procedures (including critical care time)  Medications Ordered in ED Medications  aspirin EC tablet 81 mg (has no administration in time range)  metoprolol tartrate (LOPRESSOR) tablet 75 mg (has no administration in time range)  irbesartan (AVAPRO) tablet 150 mg (has no administration in time range)  ticagrelor (BRILINTA) tablet 90 mg (has no administration in time range)  sodium chloride flush (NS) 0.9 % injection 3 mL (3 mLs Intravenous Given 04/19/19 0954)     Initial Impression / Assessment and Plan / ED Course  I have reviewed the triage vital signs and the nursing notes.  Pertinent labs & imaging results that were available during my care of the patient were reviewed by me and considered in my medical decision making (see chart for details).       73 year old male with chest pain.  Recent CABG and then revascularization with angioplasty a couple weeks later.  EKG today is somewhat change.  Cardiology consulted.  Final Clinical Impressions(s) / ED Diagnoses   Final diagnoses:  Chest pain, unspecified type    ED Discharge Orders    None       Virgel Manifold, MD 04/20/19 [id number]

## 2019-04-19 NOTE — H&P (Addendum)
Cardiology Admission History and Physical:   Patient ID: Jared Roberts MRN: [id number]; DOB: 01/14/1946   Admission date: 04/19/2019  Primary Care Provider: Deland Pretty, MD Primary Cardiologist: Mertie Moores, MD  Primary Electrophysiologist:  None   Chief Complaint:  Chest pain   Patient Profile:   Jared Roberts is a 73 y.o. male s/p recent CABG c/b early graft failure leading to VF arrest, requiring graft PCI, presenting back to the ED w/ recurrent CP.    History of Present Illness:   Jared Roberts is a 73 y/o male s/p recent CABG c/b early graft failure leading to VF arrest, requiring graft PCI, presenting back to the ED w/ recurrent CP.   In further detail, he recently established care with Dr. Acie Fredrickson in July 2020 for chest pain and had a coronary CT that was abnormal showing significant stenosis. Subsequent heart catheterization revealed severe coronary artery disease. He underwent CABG 03/12/19 withLIMA-LAD, SVG-OM, SVG-PDA however [facility]2020[facility] w/ VF arrest.On presentation, pt reportedthat he was awoken from sleep with mid-sternal/right sidedpressure in his chestrated a4-5out of10in severityw/ associated nausea and diaphoresis. He called EMS and during transport to [facility] unresponsive. Monitorper EMSshowed VF arrest. He was administered a single shock at 200J and NSR was restored. EKG post-arrest showedpossible new Q waves and new TWI in V1-V3 as compared to tracing from 03/13/19.Jared Roberts was taken to the cath lab on 04/07/2019 which showed occlusion of the saphenous vein graft to the distal circumflex and probable embolization into the obtuse marginal beyond the graft insertion site.He had successful mechanical reperfusion with angioplasty onlysecondary to smallvessel size. Echo showed preserved LVEF. VF was related to acute ischemia. No indication for ICD.   Pt now presents back to the ED w/ CC of recurrent CP that feels similar to  the pain he had prior to VF arrest from SVG occlusion earlier this month. Pain started this morning around 7:30 am. Described as anterior chest pain just right of the upper sternum with radiation down right arm. Also describes left axillary pain. Fells dull. Has mild exertional dyspnea which he reports has been his baseline since bypass. No resting dyspnea. He feels that his CP this morning did worsen slightly w/ activity. Did not resolve w/ SL NTG x 3. Given similarity of recent angina and failure to resolve w/ nitro, he came to the ED for evaluation.   His BP is elevated at 143/105. He reports full med compliance. Did receive his AM dose of Brilitna in the ED along with Coreg. Avapro has been ordered. Currently w/ 5/10 CP. Hs troponin mildly abnormal 21>>38 (peaked at 6,463 when admitted for VF arrest and SVG occlusion). EKG shows sinus tach 109 bpm. No ischemic abnormalties. CBC and BMP unremarkable.   Also of note, his monthly  Copay for Brilinta is high. $400. Would like to change to Plavix.   Heart Pathway Score:     Past Medical History:  Diagnosis Date  . Coronary artery disease   . DDD (degenerative disc disease), cervical   . Depression   . ED (erectile dysfunction) of organic origin   . Generalized osteoarthritis   . GERD (gastroesophageal reflux disease)   . Hiatal hernia   . History of chronic bronchitis   . History of gastritis    2003  . History of Helicobacter pylori infection    2003  . History of melanoma excision    2015--  right ear  . History of prostate cancer urologist-  dr [name]   2007--  pT2a Nx Mx,  Gleason 3+3, S/P  PROSTATECTOMY  . History of squamous cell carcinoma excision    2011  left knee  . Hyperlipidemia   . Hyperplastic colon polyp 2009  . Hypertension   . Male hypogonadism   . Melanoma ([place])    left 4th toe  . Nephrolithiasis   . Prostate cancer ([place])   . Ventricular fibrillation ([place]) 04/07/2019   VF, en route witnessed by EMS, defibrillated x  1    Past Surgical History:  Procedure Laterality Date  . AMPUTATION TOE Left 09/07/2015   Procedure: PARTIAL AMPUTATION TOE 4TH LEFT;  Surgeon: [name], MD;  Location: [place];  Service: Podiatry;  Laterality: Left;  . APPENDECTOMY  1970's  . CORONARY ARTERY BYPASS GRAFT N/A 03/12/2019   Procedure: CORONARY ARTERY BYPASS GRAFTING (CABG) x 3 WITH ENDOSCOPIC HARVESTING OF RIGHT GREATER SAPHENOUS VEIN;  Surgeon: [name], MD;  Location: [place];  Service: Open Heart Surgery;  Laterality: N/A;  . CORONARY BALLOON ANGIOPLASTY N/A 04/07/2019   Procedure: CORONARY BALLOON ANGIOPLASTY;  Surgeon: [name], MD;  Location: [place] [facility];  Service: Cardiovascular;  Laterality: N/A;  . CYSTO/  CLOT EVACUATION POST PROSTATECTOMY  11-19-2005  . CYSTO/  REMOVAL URETHRAL STONE AND URETHRAL STAPLES  06-02-2010  . CYSTO/  URETEROSCOPIC STONE EXTRACTION  X2  1990's  . KIDNEY STONE SURGERY    . LEFT HEART CATH AND CORONARY ANGIOGRAPHY N/A 02/25/2019   Procedure: LEFT HEART CATH AND CORONARY ANGIOGRAPHY;  Surgeon: [name], MD;  Location: [place] [facility];  Service: Cardiovascular;  Laterality: N/A;  . LEFT HEART CATH AND CORS/GRAFTS ANGIOGRAPHY N/A 04/07/2019   Procedure: LEFT HEART CATH AND CORS/GRAFTS ANGIOGRAPHY;  Surgeon: [name], MD;  Location: [place] [facility];  Service: Cardiovascular;  Laterality: N/A;  . MELANOMA EXCISION  2015   right ear  . ROBOT ASSISTED LAPAROSCOPIC RADICAL PROSTATECTOMY  11-09-2005  . SKIN CANCER EXCISION    . TEE WITHOUT CARDIOVERSION N/A 03/12/2019   Procedure: TRANSESOPHAGEAL ECHOCARDIOGRAM (TEE);  Surgeon: [name], MD;  Location: [place];  Service: Open Heart Surgery;  Laterality: N/A;  . TONSILLECTOMY  child     Medications Prior to Admission: Prior to Admission medications   Medication Sig Start Date End Date Taking? Authorizing Provider  acetaminophen (TYLENOL) 500 MG tablet Take 1 tablet (500 mg  total) by mouth every 4 (four) hours as needed for mild pain. 03/17/19   Nani Skillern, PA-C  ascorbic acid (VITAMIN C) 1000 MG tablet Take 1,000 mg by mouth daily.    [provider]  aspirin EC 81 MG EC tablet Take 1 tablet (81 mg total) by mouth daily. 04/10/19   Kathyrn Drown D, NP  atorvastatin (LIPITOR) 10 MG tablet Take 10 mg by mouth at bedtime.  01/20/19   [provider]  Cholecalciferol (VITAMIN D) 2000 units CAPS Take 2,000 Units by mouth 2 (two) times a day.     [provider]  cyclobenzaprine (FLEXERIL) 10 MG tablet Take 1 tablet (10 mg total) by mouth 3 (three) times daily as needed for muscle spasms. 04/16/19   Bhagat, Crista Luria, PA  metoprolol tartrate (LOPRESSOR) 50 MG tablet Take 1.5 tablets (75 mg total) by mouth 2 (two) times daily. 04/16/19   Leanor Kail, PA  Multiple Vitamin (MULTIVITAMIN WITH MINERALS) TABS tablet Take 1 tablet by mouth daily.    [provider]  nitroGLYCERIN (NITROSTAT)  0.4 MG SL tablet Place 1 tablet (0.4 mg total) under the tongue every 5 (five) minutes x 3 doses as needed for chest pain. 04/09/19   Tommie Raymond, NP  olmesartan (BENICAR) 20 MG tablet Take 1 tablet (20 mg total) by mouth daily. 04/16/19   Leanor Kail, PA  Probiotic Product (PROBIOTIC DAILY PO) Take 1 capsule by mouth daily.     [provider]  ticagrelor (BRILINTA) 90 MG TABS tablet Take 1 tablet (90 mg total) by mouth 2 (two) times daily. 04/09/19   Tommie Raymond, NP  traMADol (ULTRAM) 50 MG tablet Take 1 tablet (50 mg total) by mouth every 4 (four) hours as needed for moderate pain. 03/17/19   Nani Skillern, PA-C     Allergies:   No Known Allergies  Social History:   Social History   Socioeconomic History  . Marital status: Married    Spouse name: Not on file  . Number of children: Not on file  . Years of education: Not on file  . Highest education level: Not on file  Occupational History  . Not on  file  Social Needs  . Financial resource strain: Not on file  . Food insecurity    Worry: Not on file    Inability: Not on file  . Transportation needs    Medical: Not on file    Non-medical: Not on file  Tobacco Use  . Smoking status: Never Smoker  . Smokeless tobacco: Never Used  Substance and Sexual Activity  . Alcohol use: Yes    Comment: 1 drink daily  . Drug use: No  . Sexual activity: Not on file    Comment: retired, married. 1 son  Lifestyle  . Physical activity    Days per week: Not on file    Minutes per session: Not on file  . Stress: Not on file  Relationships  . Social Herbalist on phone: Not on file    Gets together: Not on file    Attends religious service: Not on file    Active member of club or organization: Not on file    Attends meetings of clubs or organizations: Not on file    Relationship status: Not on file  . Intimate partner violence    Fear of current or ex partner: Not on file    Emotionally abused: Not on file    Physically abused: Not on file    Forced sexual activity: Not on file  Other Topics Concern  . Not on file  Social History Narrative  . Not on file    Family History:   The patient's family history includes Arthritis in his mother; Congestive Heart Failure in his mother; Diabetes Mellitus II in his mother; Osteoarthritis in his mother; Other in his father.    ROS:  Please see the history of present illness.  All other ROS reviewed and negative.     Physical Exam/Data:   Vitals:   04/19/19 1400 04/19/19 1405 04/19/19 1415 04/19/19 1430  BP: (!) 133/99 (!) 133/99 (!) 148/95 (!) 138/98  Pulse: 63 66 61 60  Resp: (!) 26 (!) 26 (!) 24 (!) 21  Temp:      TempSrc:      SpO2: 97% 98% 97% 97%  Weight:      Height:       No intake or output data in the 24 hours ending 04/19/19 1458 Last 3 Weights 04/19/2019 04/18/2019 04/16/2019  Weight (  lbs) 170 lb 173 lb 173 lb  Weight (kg) 77.111 kg 78.472 kg 78.472 kg     Body  mass index is 24.39 kg/m.  General:  Well nourished, well developed, in no acute distress HEENT: normal Lymph: no adenopathy Neck: no JVD Endocrine:  No thryomegaly Vascular: No carotid bruits; FA pulses 2+ bilaterally without bruits  Cardiac:  normal S1, S2; RRR; no murmur  Lungs:  clear to auscultation bilaterally, no wheezing, rhonchi or rales  Abd: soft, nontender, no hepatomegaly  Ext: no edema Musculoskeletal:  No deformities, BUE and BLE strength normal and equal Skin: warm and dry  Neuro:  CNs 2-12 intact, no focal abnormalities noted Psych:  Normal affect    EKG:  The ECG that was done 04/19/19 was personally reviewed and demonstrates sinus tach 109, no acute ST abnormalties   Relevant CV Studies: [facility] 04/07/19 Conclusion  1. 3 vessel CAD with severe proximal and mid LAD stenosis, severe proximal RCA stenosis, and total occlusion of the distal circumflex  2. S/P CABG with patency of the LIMA-LAD and SVG-PDA, total occlusion of the SVG-OM-2 3. Normal LV systolic function with normal LVEDP 4. Successful PTCA of the distal circumflex/OM2 with restoration of TIMI-3 flow, vessel too small for stenting, moderate residual stenosis   2D Echo 04/07/19 IMPRESSIONS    1. The left ventricle has normal systolic function, with an ejection fraction of 55-60%. The cavity size was normal. Left ventricular diastolic Doppler parameters are consistent with pseudonormalization. No evidence of left ventricular regional wall  motion abnormalities.  2. Normal RV size with mildly decreased systolic function.  3. The aortic valve is tricuspid. Mild calcification of the aortic valve. No stenosis of the aortic valve.  4. The aortic root is normal in size and structure.  5. Left atrial size was mildly dilated.  6. No evidence of mitral valve stenosis. No significant regurgitation.  7. Normal IVC size. No complete TR doppler jet so unable to estimate PA systolic pressure.  Laboratory Data:  High  Sensitivity Troponin:   Recent Labs  Lab 04/07/19 0359 04/07/19 0722 04/08/19 0857 04/19/19 0916 04/19/19 1108  TROPONINIHS 20* 4,481* 6,463* 21* 38*      Chemistry Recent Labs  Lab 04/19/19 0916  NA 140  K 4.1  CL 110  CO2 21*  GLUCOSE 116*  BUN 14  CREATININE 1.12  CALCIUM 9.8  GFRNONAA >60  GFRAA >60  ANIONGAP 9    No results for input(s): PROT, ALBUMIN, AST, ALT, ALKPHOS, BILITOT in the last 168 hours. Hematology Recent Labs  Lab 04/19/19 0916  WBC 10.0  RBC 5.01  HGB 15.3  HCT 45.0  MCV 89.8  MCH 30.5  MCHC 34.0  RDW 12.4  PLT 286   BNPNo results for input(s): BNP, PROBNP in the last 168 hours.  DDimer No results for input(s): DDIMER in the last 168 hours.   Radiology/Studies:  Dg Chest 2 View  Result Date: 04/19/2019 CLINICAL DATA:  Chest pain. EXAM: CHEST - 2 VIEW COMPARISON:  Radiograph of April 07, 2019. FINDINGS: The heart size and mediastinal contours are within normal limits. Right lung is clear. Minimal left basilar subsegmental atelectasis or scarring is noted. Status post coronary bypass graft. No pneumothorax or pleural effusion is noted. The visualized skeletal structures are unremarkable. IMPRESSION: Minimal left basilar subsegmental atelectasis or scarring. Electronically Signed   By: Marijo Conception M.D.   On: 04/19/2019 10:46    Assessment and Plan:   Jared Roberts is a  73 y/o male s/p recent CABG c/b early graft failure leading to VF arrest, requiring graft PCI, presenting back to the ED w/ recurrent CP.   1. CAD w/ Recurrent CP: s/p CABG 03/12/19 withLIMA-LAD, SVG-OM, SVG-PDA followed by VF arrest 04/07/2019 secondary to occlusion of the saphenous vein graft to the distal circumflex and probable embolization into the obtuse marginal beyond the graft insertion site.He had successful mechanical reperfusion with angioplasty onlysecondary to smallvessel size. Echo showed preserved LVEF. VF was related to acute ischemia. No indication for  ICD. Now back in ED w/ recurrent CP similar to his angina associated w/ SVG occlusion. Hs troponin mildly abnormal 21>>38 (peaked at 6,463 when admitted for VF arrest and SVG occlusion). EKG shows sinus tach 109 bpm. No ischemic abnormalties. BP elevated at 143/195.  Currently w/ 5/10 CP w/ Elevated BP>>>Will start IV nitro  Admit to stepdown for further monitoring and possible relook cath on Monday  Continue ASA and Brilinta for now (plan to change to Plavix at d/c due to high cost of Brilinta)  Continue statin,  blocker and ARB  Monitor on tele    For questions or updates, please contact [place] Please consult [email or web address] for contact info under    Signed, Minus Breeding, MD  04/19/2019 2:58 PM  . History and all data above reviewed.  Patient examined.  I agree with the findings as above.  The patient presents with chest pain starting this morning.  It was 5 out of 10 in intensity.  It was similar to his previous angina.  He is only ever had this pain before when he said he is presentations with coronary disease.  He did not improve with sublingual nitroglycerin.  However, he is improved currently.  Enzymes nondiagnostic is trending down from his previous.  EKG demonstrated no acute abnormalities.  He has been having a slow recovery with some incisional discomfort but otherwise has not been having chest discomfort.  His blood pressures been up a couple of times and he had to take sublingual nitroglycerin at home.  The patient exam reveals COR:RRR  ,  Lungs: Clear  ,  Abd: Positive bowel sounds, no rebound no guarding, Ext No edema  .  All available labs, radiology testing, previous records reviewed. Agree with documented assessment and plan.   Chest pain: Unfortunately this sounds similar to previous and will represent unstable angina.  Given his cardiac catheterization is indicated.  I did review his films.  He certainly had small vessel disease in the circumflex that was treated.   However, I think this deserves remote given the severity of his previous presentations.  I had a long discussion with patient and his wife about this strategy.  He will be admitted with heparin.  He might need to be switched to Plavix at discharge.  Blood pressure seems to be slightly elevated at home.  We will need to consider up titration of his Benicar at discharge. Jared Roberts  3:28 PM  04/19/2019

## 2019-04-19 NOTE — Progress Notes (Signed)
ANTICOAGULATION CONSULT NOTE - Initial Consult  Pharmacy Consult for heparin Indication: chest pain/ACS  No Known Allergies  Patient Measurements: Height: 5\' 10"  (177.8 cm) Weight: 170 lb (77.1 kg) IBW/kg (Calculated) : 73 Heparin Dosing Weight: 77kg  Vital Signs: Temp: 97.7 F (36.5 C) (09/26 1505) Temp Source: Oral (09/26 1505) BP: 138/101 (09/26 1520) Pulse Rate: 78 (09/26 1520)  Labs: Recent Labs    04/19/19 0916 04/19/19 1108  HGB 15.3  --   HCT 45.0  --   PLT 286  --   CREATININE 1.12  --   TROPONINIHS 21* 38*    Estimated Creatinine Clearance: 58.8 mL/min (by C-G formula based on SCr of 1.12 mg/dL).   Medical History: Past Medical History:  Diagnosis Date  . Coronary artery disease   . DDD (degenerative disc disease), cervical   . Depression   . ED (erectile dysfunction) of organic origin   . Generalized osteoarthritis   . GERD (gastroesophageal reflux disease)   . Hiatal hernia   . History of chronic bronchitis   . History of gastritis    2003  . History of Helicobacter pylori infection    2003  . History of melanoma excision    2015--  right ear  . History of prostate cancer urologist-  dr [name]   2007--  pT2a Nx Mx,  Gleason 3+3, S/P  PROSTATECTOMY  . History of squamous cell carcinoma excision    2011  left knee  . Hyperlipidemia   . Hyperplastic colon polyp 2009  . Hypertension   . Male hypogonadism   . Melanoma ([place])    left 4th toe  . Nephrolithiasis   . Prostate cancer ([place])   . Ventricular fibrillation ([place]) 04/07/2019   VF, en route witnessed by EMS, defibrillated x 1     Assessment: 44 yoM with recent CABG complicated by early graft failure requiring PCI now admitted with recurrent CP. Pharmacy to start IV heparin, no anticoagulated noted PTA. Cardiology planning relook cath on Monday.  Goal of Therapy:  Heparin level 0.3-0.7 units/ml Monitor platelets by anticoagulation protocol: Yes   Plan:  -Heparin 4000 units  x1 -Heparin 1000 units/hr -Check 8-hr heparin level -Monitor heparin level, CBC, S/Sx bleeding daily   Arrie Senate, PharmD, BCPS Clinical Pharmacist [phone] Please check AMION for all [place] numbers 04/19/2019

## 2019-04-19 NOTE — ED Triage Notes (Signed)
States he has CABG x 3 on 8/19 .9/14 c/o chest pain was seen and had a blockage and had angioplasty, doing of ok , states he woke up this am with chest pain similar to the previous pain no sob

## 2019-04-20 LAB — BASIC METABOLIC PANEL
Anion gap: 8 (ref 5–15)
BUN: 15 mg/dL (ref 8–23)
CO2: 21 mmol/L — ABNORMAL LOW (ref 22–32)
Calcium: 8.9 mg/dL (ref 8.9–10.3)
Chloride: 111 mmol/L (ref 98–111)
Creatinine, Ser: 0.99 mg/dL (ref 0.61–1.24)
GFR calc Af Amer: >60 mL/min (ref >60)
GFR calc non Af Amer: >60 mL/min (ref >60)
Glucose, Bld: 122 mg/dL — ABNORMAL HIGH (ref 70–99)
Potassium: 3.8 mmol/L (ref 3.5–5.1)
Sodium: 140 mmol/L (ref 135–145)

## 2019-04-20 LAB — CBC
HCT: 39.1 % (ref 39.0–52.0)
Hemoglobin: 13.5 g/dL (ref 13.0–17.0)
MCH: 31 pg (ref 26.0–34.0)
MCHC: 34.5 g/dL (ref 30.0–36.0)
MCV: 89.7 fL (ref 80.0–100.0)
Platelets: 242 10*3/uL (ref 150–400)
RBC: 4.36 MIL/uL (ref 4.22–5.81)
RDW: 12.2 % (ref 11.5–15.5)
WBC: 11 10*3/uL — ABNORMAL HIGH (ref 4.0–10.5)
nRBC: 0 % (ref 0.0–0.2)

## 2019-04-20 LAB — HEPARIN LEVEL (UNFRACTIONATED)
Heparin Unfractionated: 0.51 IU/mL (ref 0.30–0.70)
Heparin Unfractionated: 0.42 IU/mL (ref 0.30–0.70)

## 2019-04-20 LAB — TROPONIN I (HIGH SENSITIVITY)
Troponin I (High Sensitivity): 3040 ng/L (ref <18)
Troponin I (High Sensitivity): 1671 ng/L (ref <18)

## 2019-04-20 MED ORDER — SODIUM CHLORIDE 0.9 % WEIGHT BASED INFUSION: 3.0000 mL/kg/h | INTRAVENOUS | Status: DC

## 2019-04-20 MED ORDER — ASPIRIN 81 MG PO CHEW
81.0000 mg | CHEWABLE_TABLET | ORAL | Status: AC
  Administered 2019-04-21: 81 mg via ORAL
  Filled 2019-04-20: qty 1

## 2019-04-20 MED ORDER — SODIUM CHLORIDE 0.9% FLUSH: 3.0000 mL | INTRAVENOUS | Status: DC | PRN

## 2019-04-20 MED ORDER — SODIUM CHLORIDE 0.9 % WEIGHT BASED INFUSION
1.0000 mL/kg/h | INTRAVENOUS | Status: DC
  Administered 2019-04-21: 1 mL/kg/h via INTRAVENOUS

## 2019-04-20 MED ORDER — SODIUM CHLORIDE 0.9 % IV SOLN: 250.0000 mL | INTRAVENOUS | Status: DC | PRN

## 2019-04-20 MED ORDER — SODIUM CHLORIDE 0.9% FLUSH
3.0000 mL | Freq: Two times a day (BID) | INTRAVENOUS | Status: DC
  Administered 2019-04-20: 3 mL via INTRAVENOUS

## 2019-04-20 MED ORDER — HYDROCORTISONE 1 % EX CREA
TOPICAL_CREAM | Freq: Two times a day (BID) | CUTANEOUS | Status: DC
  Administered 2019-04-20 – 2019-04-22 (×3): via TOPICAL
  Filled 2019-04-20 (×2): qty 28

## 2019-04-20 NOTE — H&P (View-Only) (Signed)
Progress Note  Patient Name: Jared Roberts Date of Encounter: 04/20/2019  Primary Cardiologist: Mertie Moores, MD   Subjective   No complaints  Inpatient Medications    Scheduled Meds: . aspirin EC  81 mg Oral Daily  . atorvastatin  10 mg Oral QHS  . irbesartan  150 mg Oral Daily  . metoprolol tartrate  75 mg Oral BID  . ticagrelor  90 mg Oral BID   Continuous Infusions: . heparin 1,000 Units/hr (04/20/19 0600)  . nitroGLYCERIN 5 mcg/min (04/20/19 0600)   PRN Meds: acetaminophen, nitroGLYCERIN   Vital Signs    Vitals:   04/19/19 1945 04/19/19 2117 04/20/19 0027 04/20/19 0500  BP:  139/86 122/81 (!) 130/97  Pulse: 72 70 66 63  Resp:   18 20  Temp: 98.3 F (36.8 C)  98 F (36.7 C) 98.1 F (36.7 C)  TempSrc: Oral  Oral Oral  SpO2: 97% 97% 96% 98%  Weight:    77.9 kg  Height:        Intake/Output Summary (Last 24 hours) at 04/20/2019 0730 Last data filed at 04/20/2019 0600 Gross per 24 hour  Intake 327.97 ml  Output -  Net 327.97 ml   Last 3 Weights 04/20/2019 04/19/2019 04/18/2019  Weight (lbs) 171 lb 11.2 oz 170 lb 173 lb  Weight (kg) 77.883 kg 77.111 kg 78.472 kg      Telemetry    SR- Personally Reviewed  ECG    n/a - Personally Reviewed  Physical Exam   GEN: No acute distress.   Neck: No JVD Cardiac: RRR, no murmurs, rubs, or gallops.  Respiratory: Clear to auscultation bilaterally. GI: Soft, nontender, non-distended  MS: No edema; No deformity. Neuro:  Nonfocal  Psych: Normal affect   Labs    High Sensitivity Troponin:   Recent Labs  Lab 04/07/19 0359 04/07/19 0722 04/08/19 0857 04/19/19 0916 04/19/19 1108  TROPONINIHS 20* 4,481* 6,463* 21* 38*      Chemistry Recent Labs  Lab 04/19/19 0916 04/20/19 0228  NA 140 140  K 4.1 3.8  CL 110 111  CO2 21* 21*  GLUCOSE 116* 122*  BUN 14 15  CREATININE 1.12 0.99  CALCIUM 9.8 8.9  GFRNONAA >60 >60  GFRAA >60 >60  ANIONGAP 9 8     Hematology Recent Labs  Lab 04/19/19 0916  04/20/19 0228  WBC 10.0 11.0*  RBC 5.01 4.36  HGB 15.3 13.5  HCT 45.0 39.1  MCV 89.8 89.7  MCH 30.5 31.0  MCHC 34.0 34.5  RDW 12.4 12.2  PLT 286 242    BNPNo results for input(s): BNP, PROBNP in the last 168 hours.   DDimer No results for input(s): DDIMER in the last 168 hours.   Radiology    Dg Chest 2 View  Result Date: 04/19/2019 CLINICAL DATA:  Chest pain. EXAM: CHEST - 2 VIEW COMPARISON:  Radiograph of April 07, 2019. FINDINGS: The heart size and mediastinal contours are within normal limits. Right lung is clear. Minimal left basilar subsegmental atelectasis or scarring is noted. Status post coronary bypass graft. No pneumothorax or pleural effusion is noted. The visualized skeletal structures are unremarkable. IMPRESSION: Minimal left basilar subsegmental atelectasis or scarring. Electronically Signed   By: Marijo Conception M.D.   On: 04/19/2019 10:46    Cardiac Studies     Patient Profile     Tajh Pinkhasov Dorian is a 73 y.o. male s/p recent CABG c/b early graft failure leading to VF arrest, requiring graft PCI,  presenting back to the ED w/ recurrent CP.   Assessment & Plan    1. CAD - s/p CABG 03/12/19 withLIMA-LAD, SVG-OM, SVG-PDAfollowed by VF arrest 04/07/2019 secondary to occlusion of the saphenous vein graft to the distal circumflex and probable embolization into the obtuse marginal beyond the graft insertion site. - He had successful mechanical reperfusion with angioplasty onlysecondary to smallvessel size - VF was related to acute ischemia. No indication for ICD.  - admitted with chest pain similar to what he had during prior SVG occlusion - chest pain free since starting NG drip - mild trop elevation thus far. EKG sinus tach isolated TWI III - medical therapy with ASA 81, atorva 10, hep gtt, irbesartan, lopressor 75mg  bid, brillinta, nitro gtt - plan for cath tomorrow - from notes cost may be an issue, may have to chagne brillinta to plavix.    I have  reviewed the risks, indications, and alternatives to cardiac catheterization, possible angioplasty, and stenting with the patient  today. Risks include but are not limited to bleeding, infection, vascular injury, stroke, myocardial infection, arrhythmia, kidney injury, radiation-related injury in the case of prolonged fluoroscopy use, emergency cardiac surgery, and death. The patient understands the risks of serious complication is 1-2 in [id number] with diagnostic cardiac cath and 1-2% or less with angioplasty/stenting.    For questions or updates, please contact [place] Please consult [email or web address] for contact info under        Signed, Carlyle Dolly, MD  04/20/2019, 7:30 AM

## 2019-04-20 NOTE — Plan of Care (Signed)
  Problem: Health Behavior/Discharge Planning: Goal: Ability to manage health-related needs will improve Outcome: Progressing   Problem: Clinical Measurements: Goal: Ability to maintain clinical measurements within normal limits will improve Outcome: Progressing Goal: Will remain free from infection Outcome: Progressing Goal: Diagnostic test results will improve Outcome: Progressing Goal: Respiratory complications will improve Outcome: Progressing Goal: Cardiovascular complication will be avoided Outcome: Progressing   Problem: Activity: Goal: Risk for activity intolerance will decrease Outcome: Progressing   Problem: Pain Managment: Goal: General experience of comfort will improve Outcome: Progressing   Problem: Safety: Goal: Ability to remain free from injury will improve Outcome: Progressing   

## 2019-04-20 NOTE — Progress Notes (Signed)
[place] for Heparin Indication: chest pain/ACS  No Known Allergies  Patient Measurements: Height: 5\' 10"  (177.8 cm) Weight: 170 lb (77.1 kg) IBW/kg (Calculated) : 73 Heparin Dosing Weight: 77kg  Vital Signs: Temp: 98 F (36.7 C) (09/27 0027) Temp Source: Oral (09/27 0027) BP: 122/81 (09/27 0027) Pulse Rate: 66 (09/27 0027)  Labs: Recent Labs    04/19/19 0916 04/19/19 1108 04/20/19 0228  HGB 15.3  --  13.5  HCT 45.0  --  39.1  PLT 286  --  242  HEPARINUNFRC  --   --  0.51  CREATININE 1.12  --  0.99  TROPONINIHS 21* 38*  --     Estimated Creatinine Clearance: 66.6 mL/min (by C-G formula based on SCr of 0.99 mg/dL).   Medical History: Past Medical History:  Diagnosis Date  . Coronary artery disease   . DDD (degenerative disc disease), cervical   . Depression   . ED (erectile dysfunction) of organic origin   . Generalized osteoarthritis   . GERD (gastroesophageal reflux disease)   . Hiatal hernia   . History of chronic bronchitis   . History of gastritis    2003  . History of Helicobacter pylori infection    2003  . History of melanoma excision    2015--  right ear  . History of prostate cancer urologist-  dr [name]   2007--  pT2a Nx Mx,  Gleason 3+3, S/P  PROSTATECTOMY  . History of squamous cell carcinoma excision    2011  left knee  . Hyperlipidemia   . Hyperplastic colon polyp 2009  . Hypertension   . Male hypogonadism   . Melanoma ([place])    left 4th toe  . Nephrolithiasis   . Prostate cancer ([place])   . Ventricular fibrillation ([place]) 04/07/2019   VF, en route witnessed by EMS, defibrillated x 1     Assessment: 47 yoM with recent CABG complicated by early graft failure requiring PCI now admitted with recurrent CP. Pharmacy to start IV heparin, no anticoagulated noted PTA. Cardiology planning relook cath on Monday.  9/27 AM update:  Initial heparin level therapeutic   Goal of Therapy:  Heparin level  0.3-0.7 units/ml Monitor platelets by anticoagulation protocol: Yes   Plan:  -Cont heparin at 1000 units/hr -Confirmatory heparin level in 8 hours  Narda Bonds, PharmD, [place] Pharmacist Phone: [phone]

## 2019-04-20 NOTE — Progress Notes (Signed)
Progress Note  Patient Name: Jared Roberts Date of Encounter: 04/20/2019  Primary Cardiologist: Mertie Moores, MD   Subjective   No complaints  Inpatient Medications    Scheduled Meds: . aspirin EC  81 mg Oral Daily  . atorvastatin  10 mg Oral QHS  . irbesartan  150 mg Oral Daily  . metoprolol tartrate  75 mg Oral BID  . ticagrelor  90 mg Oral BID   Continuous Infusions: . heparin 1,000 Units/hr (04/20/19 0600)  . nitroGLYCERIN 5 mcg/min (04/20/19 0600)   PRN Meds: acetaminophen, nitroGLYCERIN   Vital Signs    Vitals:   04/19/19 1945 04/19/19 2117 04/20/19 0027 04/20/19 0500  BP:  139/86 122/81 (!) 130/97  Pulse: 72 70 66 63  Resp:   18 20  Temp: 98.3 F (36.8 C)  98 F (36.7 C) 98.1 F (36.7 C)  TempSrc: Oral  Oral Oral  SpO2: 97% 97% 96% 98%  Weight:    77.9 kg  Height:        Intake/Output Summary (Last 24 hours) at 04/20/2019 0730 Last data filed at 04/20/2019 0600 Gross per 24 hour  Intake 327.97 ml  Output -  Net 327.97 ml   Last 3 Weights 04/20/2019 04/19/2019 04/18/2019  Weight (lbs) 171 lb 11.2 oz 170 lb 173 lb  Weight (kg) 77.883 kg 77.111 kg 78.472 kg      Telemetry    SR- Personally Reviewed  ECG    n/a - Personally Reviewed  Physical Exam   GEN: No acute distress.   Neck: No JVD Cardiac: RRR, no murmurs, rubs, or gallops.  Respiratory: Clear to auscultation bilaterally. GI: Soft, nontender, non-distended  MS: No edema; No deformity. Neuro:  Nonfocal  Psych: Normal affect   Labs    High Sensitivity Troponin:   Recent Labs  Lab 04/07/19 0359 04/07/19 0722 04/08/19 0857 04/19/19 0916 04/19/19 1108  TROPONINIHS 20* 4,481* 6,463* 21* 38*      Chemistry Recent Labs  Lab 04/19/19 0916 04/20/19 0228  NA 140 140  K 4.1 3.8  CL 110 111  CO2 21* 21*  GLUCOSE 116* 122*  BUN 14 15  CREATININE 1.12 0.99  CALCIUM 9.8 8.9  GFRNONAA >60 >60  GFRAA >60 >60  ANIONGAP 9 8     Hematology Recent Labs  Lab 04/19/19 0916  04/20/19 0228  WBC 10.0 11.0*  RBC 5.01 4.36  HGB 15.3 13.5  HCT 45.0 39.1  MCV 89.8 89.7  MCH 30.5 31.0  MCHC 34.0 34.5  RDW 12.4 12.2  PLT 286 242    BNPNo results for input(s): BNP, PROBNP in the last 168 hours.   DDimer No results for input(s): DDIMER in the last 168 hours.   Radiology    Dg Chest 2 View  Result Date: 04/19/2019 CLINICAL DATA:  Chest pain. EXAM: CHEST - 2 VIEW COMPARISON:  Radiograph of April 07, 2019. FINDINGS: The heart size and mediastinal contours are within normal limits. Right lung is clear. Minimal left basilar subsegmental atelectasis or scarring is noted. Status post coronary bypass graft. No pneumothorax or pleural effusion is noted. The visualized skeletal structures are unremarkable. IMPRESSION: Minimal left basilar subsegmental atelectasis or scarring. Electronically Signed   By: Marijo Conception M.D.   On: 04/19/2019 10:46    Cardiac Studies     Patient Profile     Quay Marsack Fontenot is a 73 y.o. male s/p recent CABG c/b early graft failure leading to VF arrest, requiring graft PCI,  presenting back to the ED w/ recurrent CP.   Assessment & Plan    1. CAD - s/p CABG 03/12/19 withLIMA-LAD, SVG-OM, SVG-PDAfollowed by VF arrest 04/07/2019 secondary to occlusion of the saphenous vein graft to the distal circumflex and probable embolization into the obtuse marginal beyond the graft insertion site. - He had successful mechanical reperfusion with angioplasty onlysecondary to smallvessel size - VF was related to acute ischemia. No indication for ICD.  - admitted with chest pain similar to what he had during prior SVG occlusion - chest pain free since starting NG drip - mild trop elevation thus far. EKG sinus tach isolated TWI III - medical therapy with ASA 81, atorva 10, hep gtt, irbesartan, lopressor 75mg  bid, brillinta, nitro gtt - plan for cath tomorrow - from notes cost may be an issue, may have to chagne brillinta to plavix.    I have  reviewed the risks, indications, and alternatives to cardiac catheterization, possible angioplasty, and stenting with the patient  today. Risks include but are not limited to bleeding, infection, vascular injury, stroke, myocardial infection, arrhythmia, kidney injury, radiation-related injury in the case of prolonged fluoroscopy use, emergency cardiac surgery, and death. The patient understands the risks of serious complication is 1-2 in [id number] with diagnostic cardiac cath and 1-2% or less with angioplasty/stenting.    For questions or updates, please contact [place] Please consult [email or web address] for contact info under        Signed, Carlyle Dolly, MD  04/20/2019, 7:30 AM

## 2019-04-20 NOTE — Progress Notes (Signed)
ANTICOAGULATION CONSULT NOTE   Pharmacy Consult for Heparin Indication: chest pain/ACS  No Known Allergies  Patient Measurements: Height: 5\' 10"  (177.8 cm) Weight: 171 lb 11.2 oz (77.9 kg) IBW/kg (Calculated) : 73 Heparin Dosing Weight: 77kg  Vital Signs: Temp: 98 F (36.7 C) (09/27 0749) Temp Source: Oral (09/27 0749) BP: 135/97 (09/27 0749) Pulse Rate: 81 (09/27 0749)  Labs: Recent Labs    04/19/19 0916 04/19/19 1108 04/20/19 0228 04/20/19 0754 04/20/19 1148  HGB 15.3  --  13.5  --   --   HCT 45.0  --  39.1  --   --   PLT 286  --  242  --   --   HEPARINUNFRC  --   --  0.51  --  0.42  CREATININE 1.12  --  0.99  --   --   TROPONINIHS 21* 38*  --  3,040*  --     Estimated Creatinine Clearance: 66.6 mL/min (by C-G formula based on SCr of 0.99 mg/dL).   Medical History: Past Medical History:  Diagnosis Date  . Coronary artery disease   . DDD (degenerative disc disease), cervical   . Depression   . ED (erectile dysfunction) of organic origin   . Generalized osteoarthritis   . GERD (gastroesophageal reflux disease)   . Hiatal hernia   . History of chronic bronchitis   . History of gastritis    2003  . History of Helicobacter pylori infection    2003  . History of melanoma excision    2015--  right ear  . History of prostate cancer urologist-  dr [name]   2007--  pT2a Nx Mx,  Gleason 3+3, S/P  PROSTATECTOMY  . History of squamous cell carcinoma excision    2011  left knee  . Hyperlipidemia   . Hyperplastic colon polyp 2009  . Hypertension   . Male hypogonadism   . Melanoma ([place])    left 4th toe  . Nephrolithiasis   . Prostate cancer ([place])   . Ventricular fibrillation ([place]) 04/07/2019   VF, en route witnessed by EMS, defibrillated x 1     Assessment: 33 yoM with recent CABG in August complicated by early graft failure requiring PCI now admitted with recurrent CP with positive troponins. Pharmacy to start IV heparin, no anticoagulation noted PTA.  Cardiology planning relook cath on Monday.  Confirmatory heparin level is therapeutic at 0.42 on 1000 units/hr. H&H has trended down now at 13.5/39.1, plts are wnl.    Goal of Therapy:  Heparin level 0.3-0.7 units/ml Monitor platelets by anticoagulation protocol: Yes   Plan:  -Cont heparin at 1000 units/hr -Confirmatory heparin level in 8 hours -check daily heparin level and CBC - monitor for signs of bleeding    Thank you,   Eddie Candle, PharmD PGY-1 Pharmacy Resident   Please check amion for clinical pharmacist contact number

## 2019-04-20 NOTE — Progress Notes (Signed)
  CRITICAL VALUE ALERT  Critical Value: Troponin 3.040  Date & Time Notied: 04/20/2019 0853  Provider Notified: Dr. Carlyle Dolly,  Orders Received/Actions taken: yes;  See orders

## 2019-04-21 ENCOUNTER — Encounter (HOSPITAL_COMMUNITY): Payer: Self-pay | Admitting: Cardiovascular Disease

## 2019-04-21 ENCOUNTER — Encounter (HOSPITAL_COMMUNITY)
Admission: EM | Disposition: A | Discharge status: Home / Self Care | Payer: Self-pay | Source: Home / Self Care | Attending: Cardiology

## 2019-04-21 DIAGNOSIS — I214 Non-ST elevation (NSTEMI) myocardial infarction: Principal | ICD-10-CM

## 2019-04-21 DIAGNOSIS — I2510 Atherosclerotic heart disease of native coronary artery without angina pectoris: Secondary | ICD-10-CM

## 2019-04-21 HISTORY — PX: LEFT HEART CATH AND CORS/GRAFTS ANGIOGRAPHY: CATH118250

## 2019-04-21 HISTORY — PX: CORONARY STENT INTERVENTION: CATH118234

## 2019-04-21 LAB — HEPARIN LEVEL (UNFRACTIONATED): Heparin Unfractionated: 0.6 IU/mL (ref 0.30–0.70)

## 2019-04-21 LAB — CBC
HCT: 40 % (ref 39.0–52.0)
Hemoglobin: 13.8 g/dL (ref 13.0–17.0)
MCH: 30.9 pg (ref 26.0–34.0)
MCHC: 34.5 g/dL (ref 30.0–36.0)
MCV: 89.5 fL (ref 80.0–100.0)
Platelets: 234 10*3/uL (ref 150–400)
RBC: 4.47 MIL/uL (ref 4.22–5.81)
RDW: 12.4 % (ref 11.5–15.5)
WBC: 7.8 10*3/uL (ref 4.0–10.5)
nRBC: 0 % (ref 0.0–0.2)

## 2019-04-21 SURGERY — LEFT HEART CATH AND CORS/GRAFTS ANGIOGRAPHY
Anesthesia: LOCAL

## 2019-04-21 MED ORDER — SODIUM CHLORIDE 0.9% FLUSH: 3.0000 mL | INTRAVENOUS | Status: DC | PRN

## 2019-04-21 MED ORDER — HEPARIN (PORCINE) IN NACL 1000-0.9 UT/500ML-% IV SOLN
INTRAVENOUS | Status: DC | PRN
  Administered 2019-04-21 (×2): 500 mL

## 2019-04-21 MED ORDER — SODIUM CHLORIDE 0.9 % IV SOLN: 250.0000 mL | INTRAVENOUS | Status: DC | PRN

## 2019-04-21 MED ORDER — MIDAZOLAM HCL 2 MG/2ML IJ SOLN
INTRAMUSCULAR | Status: AC
  Filled 2019-04-21: qty 2

## 2019-04-21 MED ORDER — LABETALOL HCL 5 MG/ML IV SOLN: 10.0000 mg | INTRAVENOUS | Status: AC | PRN

## 2019-04-21 MED ORDER — FENTANYL CITRATE (PF) 100 MCG/2ML IJ SOLN
INTRAMUSCULAR | Status: AC
  Filled 2019-04-21: qty 2

## 2019-04-21 MED ORDER — VERAPAMIL HCL 2.5 MG/ML IV SOLN
INTRAVENOUS | Status: DC | PRN
  Administered 2019-04-21: 10:00:00 10 mL via INTRA_ARTERIAL

## 2019-04-21 MED ORDER — SODIUM CHLORIDE 0.9 % IV SOLN
INTRAVENOUS | Status: AC
  Administered 2019-04-21: 12:00:00 via INTRAVENOUS

## 2019-04-21 MED ORDER — HEPARIN (PORCINE) IN NACL 1000-0.9 UT/500ML-% IV SOLN
INTRAVENOUS | Status: AC
  Filled 2019-04-21: qty 1000

## 2019-04-21 MED ORDER — HYDRALAZINE HCL 20 MG/ML IJ SOLN: 10.0000 mg | INTRAMUSCULAR | Status: AC | PRN

## 2019-04-21 MED ORDER — IOHEXOL 350 MG/ML SOLN
INTRAVENOUS | Status: DC | PRN
  Administered 2019-04-21: 140 mL via INTRA_ARTERIAL

## 2019-04-21 MED ORDER — LIDOCAINE HCL (PF) 1 % IJ SOLN
INTRAMUSCULAR | Status: DC | PRN
  Administered 2019-04-21: 2 mL

## 2019-04-21 MED ORDER — HEPARIN SODIUM (PORCINE) 1000 UNIT/ML IJ SOLN
INTRAMUSCULAR | Status: DC | PRN
  Administered 2019-04-21: 6000 [IU] via INTRAVENOUS
  Administered 2019-04-21: 4000 [IU] via INTRAVENOUS

## 2019-04-21 MED ORDER — FENTANYL CITRATE (PF) 100 MCG/2ML IJ SOLN
INTRAMUSCULAR | Status: DC | PRN
  Administered 2019-04-21: 25 ug via INTRAVENOUS

## 2019-04-21 MED ORDER — LIDOCAINE HCL (PF) 1 % IJ SOLN
INTRAMUSCULAR | Status: AC
  Filled 2019-04-21: qty 30

## 2019-04-21 MED ORDER — ISOSORBIDE MONONITRATE ER 30 MG PO TB24
30.0000 mg | ORAL_TABLET | Freq: Every day | ORAL | Status: DC
  Administered 2019-04-21 – 2019-04-22 (×2): 30 mg via ORAL
  Filled 2019-04-21 (×2): qty 1

## 2019-04-21 MED ORDER — HEPARIN SODIUM (PORCINE) 1000 UNIT/ML IJ SOLN
INTRAMUSCULAR | Status: AC
  Filled 2019-04-21: qty 1

## 2019-04-21 MED ORDER — MIDAZOLAM HCL 2 MG/2ML IJ SOLN
INTRAMUSCULAR | Status: DC | PRN
  Administered 2019-04-21: 1 mg via INTRAVENOUS

## 2019-04-21 MED ORDER — NITROGLYCERIN 1 MG/10 ML FOR IR/CATH LAB
INTRA_ARTERIAL | Status: DC | PRN
  Administered 2019-04-21: 200 ug via INTRACORONARY

## 2019-04-21 MED ORDER — SODIUM CHLORIDE 0.9% FLUSH
3.0000 mL | Freq: Two times a day (BID) | INTRAVENOUS | Status: DC
  Administered 2019-04-21 – 2019-04-22 (×2): 3 mL via INTRAVENOUS

## 2019-04-21 MED ORDER — VERAPAMIL HCL 2.5 MG/ML IV SOLN
INTRAVENOUS | Status: AC
  Filled 2019-04-21: qty 2

## 2019-04-21 SURGICAL SUPPLY — 19 items
BALLN EMERGE MR 2.0X12 (BALLOONS) ×2
BALLN ~~LOC~~ EMERGE MR 2.5X12 (BALLOONS) ×2
BALLOON EMERGE MR 2.0X12 (BALLOONS) IMPLANT
BALLOON ~~LOC~~ EMERGE MR 2.5X12 (BALLOONS) IMPLANT
CATH INFINITI 5FR MPB2 (CATHETERS) ×1 IMPLANT
CATH INFINITI MULTIPACK ST 5F (CATHETERS) ×1 IMPLANT
CATH LAUNCHER 6FR EBU3.5 (CATHETERS) ×1 IMPLANT
DEVICE RAD COMP TR BAND LRG (VASCULAR PRODUCTS) ×1 IMPLANT
GLIDESHEATH SLEND SS 6F .021 (SHEATH) ×1 IMPLANT
GUIDEWIRE INQWIRE 1.5J.035X260 (WIRE) IMPLANT
INQWIRE 1.5J .035X260CM (WIRE) ×2
KIT ENCORE 26 ADVANTAGE (KITS) ×1 IMPLANT
KIT HEART LEFT (KITS) ×2 IMPLANT
PACK CARDIAC CATHETERIZATION (CUSTOM PROCEDURE TRAY) ×2 IMPLANT
STENT SYNERGY DES 2.25X16 (Permanent Stent) ×1 IMPLANT
SYR MEDRAD MARK 7 150ML (SYRINGE) ×2 IMPLANT
TRANSDUCER W/STOPCOCK (MISCELLANEOUS) ×2 IMPLANT
TUBING CIL FLEX 10 FLL-RA (TUBING) ×2 IMPLANT
WIRE COUGAR XT STRL 190CM (WIRE) ×1 IMPLANT

## 2019-04-21 NOTE — Progress Notes (Addendum)
Pt scheduled for Lopressor 75 mg @ 2200 Pt BP 105/77- rechecked manually- 108/76. Paged Akhter MD  Per MD hold evening Lopressor

## 2019-04-21 NOTE — Interval H&P Note (Signed)
History and Physical Interval Note:  04/21/2019 9:48 AM  Jared Roberts  has presented today for surgery, with the diagnosis of NSTEMI.  The various methods of treatment have been discussed with the patient and family. After consideration of risks, benefits and other options for treatment, the patient has consented to  Procedure(s): LEFT HEART CATH AND CORS/GRAFTS ANGIOGRAPHY (N/A) as a surgical intervention.  The patient's history has been reviewed, patient examined, no change in status, stable for surgery.  I have reviewed the patient's chart and labs.  Questions were answered to the patient's satisfaction.    Cath Lab Visit (complete for each Cath Lab visit)  Clinical Evaluation Leading to the Procedure:   ACS: Yes.    Non-ACS:    Anginal Classification: CCS III  Anti-ischemic medical therapy: Minimal Therapy (1 class of medications)  Non-Invasive Test Results: No non-invasive testing performed  Prior CABG: Previous CABG         Lauree Chandler

## 2019-04-21 NOTE — Progress Notes (Signed)
ANTICOAGULATION CONSULT NOTE   Pharmacy Consult for Heparin Indication: chest pain/ACS  No Known Allergies  Patient Measurements: Height: 5\' 10"  (177.8 cm) Weight: 166 lb 1.6 oz (75.3 kg) IBW/kg (Calculated) : 73 Heparin Dosing Weight: 77kg  Vital Signs: Temp: 97.4 F (36.3 C) (09/28 0608) Temp Source: Oral (09/28 0608) BP: 151/94 (09/28 0610) Pulse Rate: 83 (09/28 0608)  Labs: Recent Labs    04/19/19 0916 04/19/19 1108 04/20/19 0228 04/20/19 0754 04/20/19 1148 04/20/19 1431  HGB 15.3  --  13.5  --   --   --   HCT 45.0  --  39.1  --   --   --   PLT 286  --  242  --   --   --   HEPARINUNFRC  --   --  0.51  --  0.42  --   CREATININE 1.12  --  0.99  --   --   --   TROPONINIHS 21* 38*  --  3,040*  --  1,671*    Estimated Creatinine Clearance: 66.6 mL/min (by C-G formula based on SCr of 0.99 mg/dL).   Medical History: Past Medical History:  Diagnosis Date  . Coronary artery disease   . DDD (degenerative disc disease), cervical   . Depression   . ED (erectile dysfunction) of organic origin   . Generalized osteoarthritis   . GERD (gastroesophageal reflux disease)   . Hiatal hernia   . History of chronic bronchitis   . History of gastritis    2003  . History of Helicobacter pylori infection    2003  . History of melanoma excision    2015--  right ear  . History of prostate cancer urologist-  dr [name]   2007--  pT2a Nx Mx,  Gleason 3+3, S/P  PROSTATECTOMY  . History of squamous cell carcinoma excision    2011  left knee  . Hyperlipidemia   . Hyperplastic colon polyp 2009  . Hypertension   . Male hypogonadism   . Melanoma ([place])    left 4th toe  . Nephrolithiasis   . Prostate cancer ([place])   . Ventricular fibrillation ([place]) 04/07/2019   VF, en route witnessed by EMS, defibrillated x 1     Assessment: 11 yoM with recent CABG in August complicated by early graft failure requiring PCI now admitted with recurrent CP with positive troponins. Pharmacy to start  IV heparin, no anticoagulation noted PTA. Cardiology planning relook cath on Monday.  Confirmatory heparin level is therapeutic at 0.6 on 1000 units/hr. CBC stable overnight.    Goal of Therapy:  Heparin level 0.3-0.7 units/ml Monitor platelets by anticoagulation protocol: Yes   Plan:  -Continue heparin at 1000 units/hr -Follow up after cath this am   Thank you,   Erin Hearing PharmD., BCPS Clinical Pharmacist 04/21/2019 10:29 AM

## 2019-04-21 NOTE — Progress Notes (Addendum)
Progress Note  Patient Name: Jared Roberts Date of Encounter: 04/21/2019  Primary Cardiologist: Mertie Moores, MD  Subjective   Still with mild twinges of chest pain overnight, but much improved since placed on IV nitro.   Inpatient Medications    Scheduled Meds: . aspirin EC  81 mg Oral Daily  . atorvastatin  10 mg Oral QHS  . hydrocortisone cream   Topical BID  . irbesartan  150 mg Oral Daily  . metoprolol tartrate  75 mg Oral BID  . sodium chloride flush  3 mL Intravenous Q12H  . ticagrelor  90 mg Oral BID   Continuous Infusions: . sodium chloride    . sodium chloride 1 mL/kg/hr (04/21/19 0605)  . heparin 1,000 Units/hr (04/20/19 1431)  . nitroGLYCERIN 5 mcg/min (04/20/19 0600)   PRN Meds: sodium chloride, acetaminophen, nitroGLYCERIN, sodium chloride flush   Vital Signs    Vitals:   04/20/19 2206 04/21/19 0003 04/21/19 0608 04/21/19 0610  BP: (!) 143/98 (!) 145/88  (!) 151/94  Pulse: 93 75 83   Resp:      Temp:   (!) 97.4 F (36.3 C)   TempSrc:   Oral   SpO2:  99% 99%   Weight:   75.3 kg   Height:        Intake/Output Summary (Last 24 hours) at 04/21/2019 0745 Last data filed at 04/21/2019 0004 Gross per 24 hour  Intake 463.47 ml  Output 375 ml  Net 88.47 ml   Last 3 Weights 04/21/2019 04/20/2019 04/19/2019  Weight (lbs) 166 lb 1.6 oz 171 lb 11.2 oz 170 lb  Weight (kg) 75.342 kg 77.883 kg 77.111 kg      Telemetry    SR, brief 4 beat NSVT - Personally Reviewed  ECG    No new tracing this morning - Personally Reviewed  Physical Exam  Pleasant older WM, laying in bed. GEN: No acute distress.   Neck: No JVD Cardiac: RRR, no murmurs, rubs, or gallops.  Respiratory: Clear to auscultation bilaterally. GI: Soft, nontender, non-distended  MS: No edema; No deformity. Neuro:  Nonfocal  Psych: Normal affect   Labs    High Sensitivity Troponin:   Recent Labs  Lab 04/08/19 0857 04/19/19 0916 04/19/19 1108 04/20/19 0754 04/20/19 1431   TROPONINIHS 6,463* 21* 38* 3,040* 1,671*      Chemistry Recent Labs  Lab 04/19/19 0916 04/20/19 0228  NA 140 140  K 4.1 3.8  CL 110 111  CO2 21* 21*  GLUCOSE 116* 122*  BUN 14 15  CREATININE 1.12 0.99  CALCIUM 9.8 8.9  GFRNONAA >60 >60  GFRAA >60 >60  ANIONGAP 9 8     Hematology Recent Labs  Lab 04/19/19 0916 04/20/19 0228  WBC 10.0 11.0*  RBC 5.01 4.36  HGB 15.3 13.5  HCT 45.0 39.1  MCV 89.8 89.7  MCH 30.5 31.0  MCHC 34.0 34.5  RDW 12.4 12.2  PLT 286 242    BNPNo results for input(s): BNP, PROBNP in the last 168 hours.   DDimer No results for input(s): DDIMER in the last 168 hours.   Radiology    Dg Chest 2 View  Result Date: 04/19/2019 CLINICAL DATA:  Chest pain. EXAM: CHEST - 2 VIEW COMPARISON:  Radiograph of April 07, 2019. FINDINGS: The heart size and mediastinal contours are within normal limits. Right lung is clear. Minimal left basilar subsegmental atelectasis or scarring is noted. Status post coronary bypass graft. No pneumothorax or pleural effusion is noted. The  visualized skeletal structures are unremarkable. IMPRESSION: Minimal left basilar subsegmental atelectasis or scarring. Electronically Signed   By: Marijo Conception M.D.   On: 04/19/2019 10:46    Cardiac Studies   Cath: 04/07/19  1. 3 vessel CAD with severe proximal and mid LAD stenosis, severe proximal RCA stenosis, and total occlusion of the distal circumflex  2. S/P CABG with patency of the LIMA-LAD and SVG-PDA, total occlusion of the SVG-OM-2 3. Normal LV systolic function with normal LVEDP 4. Successful PTCA of the distal circumflex/OM2 with restoration of TIMI-3 flow, vessel too small for stenting, moderate residual stenosis  Recommend: medical therapy, ASA/ticagrelor x 12 months   Diagnostic Dominance: Right  Intervention   TTE: 04/07/19  IMPRESSIONS    1. The left ventricle has normal systolic function, with an ejection fraction of 55-60%. The cavity size was  normal. Left ventricular diastolic Doppler parameters are consistent with pseudonormalization. No evidence of left ventricular regional wall  motion abnormalities.  2. Normal RV size with mildly decreased systolic function.  3. The aortic valve is tricuspid. Mild calcification of the aortic valve. No stenosis of the aortic valve.  4. The aortic root is normal in size and structure.  5. Left atrial size was mildly dilated.  6. No evidence of mitral valve stenosis. No significant regurgitation.  7. Normal IVC size. No complete TR doppler jet so unable to estimate PA systolic pressure.  Patient Profile     73 y.o. male s/p recent CABG c/b early graft failure leading to VF arrest, requiring graft PCI, presenting back to the ED w/ recurrent CP.Admitted for NSTEMI.   Assessment & Plan    1. CAD/NSTEMI: s/pCABG 03/12/19 withLIMA-LAD, SVG-OM, SVG-PDAfollowed by VF arrest09/14/2020secondary toocclusion of the saphenous vein graft to the distal circumflex and probable embolization into the obtuse marginal beyond the graft insertion site. He had successful mechanical reperfusion with angioplasty onlysecondary to smallvessel size. -- HsT peaked at 3040 this admission. Still with mild twinges of chest pain. Planned for cardiac cath today.  -- remains on IV heparin, nitro gtt, asa, brilinta, statin and BB. Reported brilinta copay will be around $400. Will need to transition to plavix after 30 day supply completed.   2. Hx of VF arrest: back 04/07/19 that was related to acute ischemia. No indication for ICD at that time.   3. HL: on atrova 10mg  daily. Intolerant to high dose in the past. LDL (9/15) 67.   4. HTN: stable with BB, ARB   For questions or updates, please contact [place] Please consult [email or web address] for contact info under   Signed, Reino Bellis, NP  04/21/2019, 7:45 AM    Patient seen, examined. Available data reviewed. Agree with findings, assessment, and plan as  outlined by Reino Bellis, NP.  The patient is independently interviewed and examined.  His wife is at the bedside.  On my exam, he is alert, oriented, in no distress.  JVP is normal, lungs are clear, heart is regular rate and rhythm with no murmur gallop, abdomen is soft and nontender, extremities show no edema.  The right radial site is clear.  I personally reviewed the patient's cardiac catheterization films and discussed his case with Dr. Angelena Form.  He has now undergone PCI of the left circumflex after presenting with recurrent non-STEMI.  Otherwise his coronary anatomy is stable.  As long as he has no complications, I would anticipate hospital discharge tomorrow on his current medical program.  [name], M.D. 04/21/2019 5:41 PM

## 2019-04-22 DIAGNOSIS — E785 Hyperlipidemia, unspecified: Secondary | ICD-10-CM

## 2019-04-22 LAB — BASIC METABOLIC PANEL
Anion gap: 7 (ref 5–15)
BUN: 12 mg/dL (ref 8–23)
CO2: 20 mmol/L — ABNORMAL LOW (ref 22–32)
Calcium: 8.8 mg/dL — ABNORMAL LOW (ref 8.9–10.3)
Chloride: 112 mmol/L — ABNORMAL HIGH (ref 98–111)
Creatinine, Ser: 0.98 mg/dL (ref 0.61–1.24)
GFR calc Af Amer: >60 mL/min (ref >60)
GFR calc non Af Amer: >60 mL/min (ref >60)
Glucose, Bld: 107 mg/dL — ABNORMAL HIGH (ref 70–99)
Potassium: 3.4 mmol/L — ABNORMAL LOW (ref 3.5–5.1)
Sodium: 139 mmol/L (ref 135–145)

## 2019-04-22 LAB — CBC
HCT: 37.2 % — ABNORMAL LOW (ref 39.0–52.0)
Hemoglobin: 12.4 g/dL — ABNORMAL LOW (ref 13.0–17.0)
MCH: 30.1 pg (ref 26.0–34.0)
MCHC: 33.3 g/dL (ref 30.0–36.0)
MCV: 90.3 fL (ref 80.0–100.0)
Platelets: 248 10*3/uL (ref 150–400)
RBC: 4.12 MIL/uL — ABNORMAL LOW (ref 4.22–5.81)
RDW: 12.4 % (ref 11.5–15.5)
WBC: 9.3 10*3/uL (ref 4.0–10.5)
nRBC: 0 % (ref 0.0–0.2)

## 2019-04-22 LAB — POCT ACTIVATED CLOTTING TIME: Activated Clotting Time: 296 seconds

## 2019-04-22 MED ORDER — ISOSORBIDE MONONITRATE ER 30 MG PO TB24: 30.0000 mg | ORAL_TABLET | Freq: Every day | ORAL | 0 refills | Status: DC

## 2019-04-22 MED ORDER — POTASSIUM CHLORIDE CRYS ER 20 MEQ PO TBCR
40.0000 meq | EXTENDED_RELEASE_TABLET | Freq: Once | ORAL | Status: AC
  Administered 2019-04-22: 40 meq via ORAL
  Filled 2019-04-22: qty 2

## 2019-04-22 MED FILL — ISOSORBIDE MN ER 30 MG TAB: 30 | 30 days supply | Qty: 30 | Fill #0

## 2019-04-22 NOTE — TOC Benefit Eligibility Note (Signed)
Transition of Care [facility]) Benefit Eligibility Note    Patient Details  Name: WELFORD GUZEK MRN: [id number] Date of Birth: 30-Jun-1946   Medication/Dose: BRILINTA  90 MG BID  Covered?: Yes  Tier: (TIER- 4 DRUG)  Prescription Coverage Preferred Pharmacy: [facility] with Person/Company/Phone Number:: LISA  @ [employer] [id number] # [phone]  Co-Pay: $216.67  Prior Approval: No  Deductible: Unmet($116.67 + $100.00 = $216.67)  Additional Notes: MUST NDC # [id number]    Memory Argue Phone Number: 04/22/2019, 10:22 AM

## 2019-04-22 NOTE — Discharge Summary (Signed)
Discharge Summary    Patient ID: Jared Roberts,  MRN: [id number], DOB/AGE: 08-18-1945 73 y.o.  Admit date: 04/19/2019 Discharge date: 04/22/2019  Primary Care Provider: Deland Pretty Primary Cardiologist: Mertie Moores, MD  Discharge Diagnoses    Principal Problem:   Unstable angina [facility]) Active Problems:   Benign essential HTN   S/P CABG x 3   NSTEMI (non-ST elevated myocardial infarction) ([place])   Hyperlipidemia   Allergies No Known Allergies  Diagnostic Studies/Procedures    Cath: 04/21/19   Prox LAD to Mid LAD lesion is 60% stenosed.  Mid LAD lesion is 80% stenosed.  Dist Cx lesion is 95% stenosed.  Balloon angioplasty was performed.  Prox RCA lesion is 90% stenosed.  Origin to Prox Graft lesion is 100% stenosed.  SVG.  LIMA and is normal in caliber.  Graft was not visualized.  Prox Cx to Mid Cx lesion is 80% stenosed.  The left ventricular systolic function is normal.  LV end diastolic pressure is normal.  The left ventricular ejection fraction is 55-65% by visual estimate.  There is no mitral valve regurgitation.   1. Triple vessel CAD s/p 3V CABG with 2/3 patent bypass grafts 2. Severe stenosis mid LAD. Patent LIMA graft to LAD 3. Severe stenosis mid Circumflex. Severe stenosis in the very small caliber first obtuse marginal branch at the site of the vein graft anastomosis. Occluded vein graft to SVG (known, not injected today).  4. Successful DES x 1 mid Circumflex.  5. Successful balloon angioplasty small caliber obtuse marginal branch 6. Severe stenosis proximal RCA. Patent vein graft to PDA.  7. Normal LV systolic function  Recommendations: ASA and Brilinta for one year. Continue statin and beta blocker. Will add a long acting nitrate.   Diagnostic Dominance: Right   _____________   History of Present Illness     Jared Roberts is a 73 y/o male s/p recent CABG c/b early graft failure leading to VF arrest, requiring graft PCI,  presenting back to the ED w/ recurrent CP.   In further detail, he recently established care with Dr. Acie Fredrickson in July 2020 for chest pain and had a coronary CT that was abnormal showing significant stenosis. Subsequent heart catheterizationrevealed severe coronary artery disease. He underwentCABG 03/12/19 withLIMA-LAD, SVG-OM, SVG-PDAhowever[facility] w/ VF arrest.On presentation, pt reportedthat he was awoken from sleep with mid-sternal/right sidedpressure in his chestrated a4-5out of10in severityw/ associated nausea and diaphoresis. He called EMS and during transport to [facility] unresponsive. Monitorper EMSshowed VF arrest. He was administered a single shock at 200J and NSR was restored. EKG post-arrest showedpossible new Q waves and new TWI in V1-V3 as compared to tracing from 03/13/19.Jared Roberts was taken to the cath lab on 04/07/2019 which showed occlusion of the saphenous vein graft to the distal circumflex and probable embolization into the obtuse marginal beyond the graft insertion site.He had successful mechanical reperfusion with angioplasty onlysecondary to smallvessel size.Echo showed preserved LVEF.VF was related to acute ischemia. No indication for ICD.   Pt presented back to the ED w/ CC of recurrent CP that felt similar to the pain he had prior to VF arrest from SVG occlusion earlier this month. Pain started that morning around 7:30 am. Described as anterior chest pain just right of the upper sternum with radiation down right arm. Also describes left axillary pain. Felt dull. Had mild exertional dyspnea which he reported had been his baseline since bypass. No resting dyspnea. He felt that his  CP did worsen slightly w/ activity. Did not resolve w/ SL NTG x 3. Given similarity of recent angina and failure to resolve w/ nitro, he came to the ED for evaluation.   His BP was elevated at 143/105. He reported full med compliance. Did receive  his AM dose of Brilitna in the ED along with Coreg. Complained of 5/10 CP. Hs troponin mildly abnormal 21>>38 (peaked at 6,463 when admitted for VF arrest and SVG occlusion). EKG showed sinus tach 109 bpm. No ischemic abnormalties. CBC and BMP unremarkable.    Hospital Course     1. CAD/NSTEMI: s/pCABG 03/12/19 withLIMA-LAD, SVG-OM, SVG-PDAfollowed by VF arrest09/14/2020secondary toocclusion of the saphenous vein graft to the distal circumflex and probable embolization into the obtuse marginal beyond the graft insertion site.He had successful mechanical reperfusion with angioplasty onlysecondary to smallvessel size during his admission back on 04/07/19. HsT peaked at 3040 this admission. Was placed on IV heparin until cardiac cath. Underwent cath with Dr. Angelena Form noted above with PCI/DESx1 to the [facility], along with balloon angioplasty to small caliber OM branch. Placed on Imdur post cath. Plan to continue on DAPT for at least one year. Currently on ASA/Brilinta. Has supply through 10/15, then copay will be >$400. Will plan to transition to plavix, at outpatient appt. Instructed not to run out of medications. Would reload with plavix 300mg , then 75mg  daily when transitioned. Discussed with MD. Seen by cardiac rehab and ambulated without recurrent chest pain.   2. Hx of VF arrest: back 04/07/19 that was related to acute ischemia. No indication for ICD at that time.   3. HL: on atrova 10mg  daily. Intolerant to high dose in the past. LDL (9/15) 67.   4. HTN: blood pressures were elevated on admission. He was continued on his BB and ARB. Soft bps post cath, and metoprolol was held. Ambulated in the hallway with improvement. Will plan to continue on regimen and monitor BP as an outpatient.   General: Well developed, well nourished, male appearing in no acute distress. Head: Normocephalic, atraumatic.  Neck: Supple without bruits, JVD. Lungs:  Resp regular and unlabored, CTA. Heart: RRR, S1, S2, no  S3, S4, or murmur; no rub. Abdomen: Soft, non-tender, non-distended with normoactive bowel sounds. No hepatomegaly. No rebound/guarding. No obvious abdominal masses. Extremities: No clubbing, cyanosis, edema. Distal pedal pulses are 2+ bilaterally. Left radial cath site stable without bruising or hematoma Neuro: Alert and oriented X 3. Moves all extremities spontaneously. Psych: Normal affect.  Luanna Cole Acklin was seen by Dr. Angelena Form and determined stable for discharge home. Follow up in the office has been arranged. Medications are listed below.   _____________  Discharge Vitals Blood pressure 119/72, pulse 97, temperature 97.7 F (36.5 C), temperature source Oral, resp. rate 18, height 5\' 10"  (1.778 m), weight 75.4 kg, SpO2 99 %.  Filed Weights   04/20/19 0500 04/21/19 0608 04/22/19 0446  Weight: 77.9 kg 75.3 kg 75.4 kg    Labs & Radiologic Studies    CBC Recent Labs    04/21/19 0746 04/22/19 0424  WBC 7.8 9.3  HGB 13.8 12.4*  HCT 40.0 37.2*  MCV 89.5 90.3  PLT 234 [id number]   Basic Metabolic Panel Recent Labs    04/20/19 0228 04/22/19 0424  NA 140 139  K 3.8 3.4*  CL 111 112*  CO2 21* 20*  GLUCOSE 122* 107*  BUN 15 12  CREATININE 0.99 0.98  CALCIUM 8.9 8.8*   Liver Function Tests No results for input(s): AST, ALT,  ALKPHOS, BILITOT, PROT, ALBUMIN in the last 72 hours. No results for input(s): LIPASE, AMYLASE in the last 72 hours. Cardiac Enzymes No results for input(s): CKTOTAL, CKMB, CKMBINDEX, TROPONINI in the last 72 hours. BNP Invalid input(s): POCBNP D-Dimer No results for input(s): DDIMER in the last 72 hours. Hemoglobin A1C No results for input(s): HGBA1C in the last 72 hours. Fasting Lipid Panel No results for input(s): CHOL, HDL, LDLCALC, TRIG, CHOLHDL, LDLDIRECT in the last 72 hours. Thyroid Function Tests No results for input(s): TSH, T4TOTAL, T3FREE, THYROIDAB in the last 72 hours.  Invalid input(s): FREET3 _____________  Dg Chest 2  View  Result Date: 04/19/2019 CLINICAL DATA:  Chest pain. EXAM: CHEST - 2 VIEW COMPARISON:  Radiograph of April 07, 2019. FINDINGS: The heart size and mediastinal contours are within normal limits. Right lung is clear. Minimal left basilar subsegmental atelectasis or scarring is noted. Status post coronary bypass graft. No pneumothorax or pleural effusion is noted. The visualized skeletal structures are unremarkable. IMPRESSION: Minimal left basilar subsegmental atelectasis or scarring. Electronically Signed   By: Marijo Conception M.D.   On: 04/19/2019 10:46   Dg Chest Port 1 View  Result Date: 04/07/2019 CLINICAL DATA:  Chest pain. EXAM: PORTABLE CHEST 1 VIEW COMPARISON:  Radiographs of March 15, 2019. FINDINGS: Stable cardiomediastinal silhouette. Status post coronary bypass graft. No pneumothorax is noted. Right lung is clear. Minimal left basilar subsegmental atelectasis is noted with small pleural effusion. Bony thorax is unremarkable. IMPRESSION: Minimal left basilar subsegmental atelectasis is noted with small left pleural effusion. Electronically Signed   By: Marijo Conception M.D.   On: 04/07/2019 07:37   Disposition   Pt is being discharged home today in good condition.  Follow-up Plans & Appointments     Discharge Instructions    Amb Referral to Cardiac Rehabilitation   Complete by: As directed    Diagnosis: Coronary Stents   After initial evaluation and assessments completed: Virtual Based Care may be provided alone or in conjunction with Phase 2 Cardiac Rehab based on patient barriers.: Yes   Call MD for:  redness, tenderness, or signs of infection (pain, swelling, redness, odor or green/yellow discharge around incision site)   Complete by: As directed    Diet - low sodium heart healthy   Complete by: As directed    Discharge instructions   Complete by: As directed    Radial Site Care Refer to this sheet in the next few weeks. These instructions provide you with information  on caring for yourself after your procedure. Your caregiver may also give you more specific instructions. Your treatment has been planned according to current medical practices, but problems sometimes occur. Call your caregiver if you have any problems or questions after your procedure. HOME CARE INSTRUCTIONS You may shower the day after the procedure.Remove the bandage (dressing) and gently wash the site with plain soap and water.Gently pat the site dry.  Do not apply powder or lotion to the site.  Do not submerge the affected site in water for 3 to 5 days.  Inspect the site at least twice daily.  Do not flex or bend the affected arm for 24 hours.  No lifting over 5 pounds (2.3 kg) for 5 days after your procedure.  Do not drive home if you are discharged the same day of the procedure. Have someone else drive you.  You may drive 24 hours after the procedure unless otherwise instructed by your caregiver.  What to expect: Any bruising  will usually fade within 1 to 2 weeks.  Blood that collects in the tissue (hematoma) may be painful to the touch. It should usually decrease in size and tenderness within 1 to 2 weeks.  SEEK IMMEDIATE MEDICAL CARE IF: You have unusual pain at the radial site.  You have redness, warmth, swelling, or pain at the radial site.  You have drainage (other than a small amount of blood on the dressing).  You have chills.  You have a fever or persistent symptoms for more than 72 hours.  You have a fever and your symptoms suddenly get worse.  Your arm becomes pale, cool, tingly, or numb.  You have heavy bleeding from the site. Hold pressure on the site.   PLEASE DO NOT MISS ANY DOSES OF YOUR BRILINTA!!!!! Also keep a log of you blood pressures and bring back to your follow up appt. Please call the office with any questions.   Patients taking blood thinners should generally stay away from medicines like ibuprofen, Advil, Motrin, naproxen, and Aleve due to risk of stomach  bleeding. You may take Tylenol as directed or talk to your primary doctor about alternatives.   Increase activity slowly   Complete by: As directed        Discharge Medications     Medication List    TAKE these medications   acetaminophen 500 MG tablet Commonly known as: TYLENOL Take 1 tablet (500 mg total) by mouth every 4 (four) hours as needed for mild pain.   ascorbic acid 1000 MG tablet Commonly known as: VITAMIN C Take 1,000 mg by mouth daily.   aspirin 81 MG EC tablet Take 1 tablet (81 mg total) by mouth daily.   atorvastatin 10 MG tablet Commonly known as: LIPITOR Take 10 mg by mouth at bedtime.   cyclobenzaprine 10 MG tablet Commonly known as: FLEXERIL Take 1 tablet (10 mg total) by mouth 3 (three) times daily as needed for muscle spasms.   isosorbide mononitrate 30 MG 24 hr tablet Commonly known as: IMDUR Take 1 tablet (30 mg total) by mouth daily. Start taking on: April 23, 2019   metoprolol tartrate 50 MG tablet Commonly known as: LOPRESSOR Take 1.5 tablets (75 mg total) by mouth 2 (two) times daily.   multivitamin with minerals Tabs tablet Take 1 tablet by mouth daily.   nitroGLYCERIN 0.4 MG SL tablet Commonly known as: NITROSTAT Place 1 tablet (0.4 mg total) under the tongue every 5 (five) minutes x 3 doses as needed for chest pain.   olmesartan 20 MG tablet Commonly known as: BENICAR Take 1 tablet (20 mg total) by mouth daily.   PROBIOTIC DAILY PO Take 1 capsule by mouth daily.   ticagrelor 90 MG Tabs tablet Commonly known as: BRILINTA Take 1 tablet (90 mg total) by mouth 2 (two) times daily.   traMADol 50 MG tablet Commonly known as: ULTRAM Take 1 tablet (50 mg total) by mouth every 4 (four) hours as needed for moderate pain.   Vitamin D 50 MCG (2000 UT) Caps Take 2,000 Units by mouth 2 (two) times a day.        Acute coronary syndrome (MI, NSTEMI, STEMI, etc) this admission?: Yes.     AHA/ACC Clinical Performance & Quality  Measures: 1. Aspirin prescribed? - Yes 2. ADP Receptor Inhibitor (Plavix/Clopidogrel, Brilinta/Ticagrelor or Effient/Prasugrel) prescribed (includes medically managed patients)? - Yes 3. Beta Blocker prescribed? - Yes 4. High Intensity Statin (Lipitor 40-80mg  or Crestor 20-40mg ) prescribed? - No, intolerant 5. EF  assessed during THIS hospitalization? - No - recent echo 6. For EF <40%, was ACEI/ARB prescribed? - Not Applicable (EF >/= [id number]) 7. For EF <40%, Aldosterone Antagonist (Spironolactone or Eplerenone) prescribed? - Not Applicable (EF >/= [id number]) 8. Cardiac Rehab Phase II ordered (Included Medically managed Patients)? - Yes      Outstanding Labs/Studies   N/a   Duration of Discharge Encounter   Greater than 30 minutes including physician time.  Signed, Reino Bellis NP-C 04/22/2019, 10:18 AM   I have personally seen and examined this patient with Reino Bellis, NP. I agree with the assessment and plan as outlined above. He was admitted with a NSTEMI. DES x 1 in the Circumflex yesterday. CAD otherwise stable. He has no chest pain this am. Will d/c home today on ASA/Brilinta. Imdur added yesterday.   Reino Bellis 04/22/2019 10:18 AM

## 2019-04-22 NOTE — Progress Notes (Signed)
CARDIAC REHAB PHASE I   PRE:  Rate/Rhythm: 75 SR  BP:  Sitting: 115/76      SaO2: 97 RA  MODE:  Ambulation: 470 ft   POST:  Rate/Rhythm: 126 ST  BP:  Sitting: 119/75    SaO2: 98 RA  Pts HR noted to be 75 when pt resting in room, when pt awake and talking HR 100s, when pt got OOB HR went to 120s. Pt states symptoms of slight SOB, and dizziness. Pt able to ambulate 465ft in hallway standby assist with steady gait. Pt very anxious about HR and BP. Pt educated on importance of ASA, Plavix, and NTG. Stent card at bedside. Pt with education materials from last visit. Will re-refer to CRP II [place].   [id number] Rufina Falco, RN BSN 04/22/2019 9:31 AM

## 2019-04-22 NOTE — Consult Note (Signed)
   [facility] Inpatient Consult   04/22/2019  Jared Roberts [birth year] [id number]   Patient screened for unplanned readmission and hospitalizations to check if potential [place] Management services are needed.  Review of patient's medical record reveals patient is chest pain HX of CABG, new medications started.  Primary Care Provider is  Deland Pretty, MD this office does the [facility] follow up.  Attempted calls to patient's cell phone and house phone and unable to reach for post hospital follow up in the [place].   Plan:  General EMMI calls for follow up.  For questions contact:   Natividad Brood, RN BSN [facility] Liaison  [phone] business mobile phone Toll free office [phone]  Fax number: [phone] [email or web address]

## 2019-04-25 ENCOUNTER — Telehealth: Payer: Self-pay | Admitting: Nurse Practitioner

## 2019-04-25 ENCOUNTER — Telehealth (HOSPITAL_COMMUNITY): Payer: Self-pay

## 2019-04-25 NOTE — Telephone Encounter (Signed)
Called patient in regards to his MyChart message about not feeling well since starting Imdur on 9/30. He reports a terrible headache and feeling poor. Blood pressure readings are: 9/30 129/80 and 160/95 10/1 115/81 and 129/87 10/2 119/85 Reports that BP readings are taken 2 hours after medications morning and night. HR 82-88 bpm each time he checks He had a 3rd left heart cath on 9/28 which resulted in an additional stent. He has tortuous flow to the distal end of the circumflex artery and it was felt that he would benefit from long acting nitrate therapy. Patient states Tylenol does not help his headache. I asked if he is willing to try Imdur 15 mg for awhile to see if he can tolerate to which he agrees. He states he has good appetite but does not drink enough water. I encouraged him to work on better hydration. I advised that I will forward message to Dr. Acie Fredrickson and call him back with additional advice. He verbalized understanding and agreement and thanked me for the call.

## 2019-04-25 NOTE — Telephone Encounter (Signed)
Pt returned CR phone call and expressed interest. Explained scheduling process and went over insurance, patient verbalized understanding. Will contact patient for scheduling once f/u has been completed.

## 2019-04-25 NOTE — Telephone Encounter (Signed)
Agree with decreasing the isosorbide to 15 mg a day.  It sometimes takes 5 to 7 days to get used to the nitrate headache and then it typically resolves.

## 2019-04-25 NOTE — Telephone Encounter (Signed)
Dr. Elmarie Shiley advice sent through MyChart per patient request when I spoke with him earlier today.

## 2019-05-05 ENCOUNTER — Telehealth: Payer: Self-pay | Admitting: Nurse Practitioner

## 2019-05-05 ENCOUNTER — Ambulatory Visit: Payer: Medicare HMO | Admitting: Cardiovascular Disease

## 2019-05-05 MED ORDER — OLMESARTAN MEDOXOMIL 20 MG PO TABS: 20.0000 mg | ORAL_TABLET | Freq: Two times a day (BID) | ORAL | 11 refills | Status: DC

## 2019-05-05 MED ORDER — ISOSORBIDE MONONITRATE ER 30 MG PO TB24: 15.0000 mg | ORAL_TABLET | Freq: Every day | ORAL | 0 refills | Status: DC

## 2019-05-05 NOTE — Telephone Encounter (Signed)
Called patient per request through [place]. I talked with him on 10/2 and he reduced his isosorbide to 15 mg daily due to headache. Since that time he has had high BP readings mostly in the evenings. He reports blurred vision and headache may have been associated with high BP. States his BP has been working accurately. Recent BP in the evening of 150/111 mmHg with HR 91. He reports other high readings as well as some normal readings: 10/9 119/79, HR 79 10/11 124/83 He previously took Benicar 40 mg prior to CABG. He asks if he should take Benicar 20 mg twice daily. I discussed with Dr. Acie Fredrickson who is in the office and he confirmed for patient to increase Benicar to 20 mg twice daily. Patient will continue Imdur at 15 mg daily and will bring BP and HR readings to appointment on Thursday 10/15. I answered patient's questions about feelings of fatigue and depressed mood. He is scheduled to start cardiac rehab next week and I encouraged him that we will continue to monitor and adjust medications appopriately so that he can return to normal activities. He thanked me for the call.

## 2019-05-06 ENCOUNTER — Ambulatory Visit: Payer: Medicare HMO | Admitting: Cardiovascular Disease

## 2019-05-06 DIAGNOSIS — H4921 Sixth [abducent] nerve palsy, right eye: Secondary | ICD-10-CM | POA: Diagnosis not present

## 2019-05-08 ENCOUNTER — Other Ambulatory Visit: Payer: Self-pay

## 2019-05-08 ENCOUNTER — Ambulatory Visit: Payer: Medicare HMO | Admitting: Cardiovascular Disease

## 2019-05-08 ENCOUNTER — Telehealth: Payer: Self-pay | Admitting: Cardiovascular Disease

## 2019-05-08 ENCOUNTER — Encounter: Payer: Self-pay | Admitting: Cardiovascular Disease

## 2019-05-08 VITALS — BP 108/72 | HR 98 | Ht 70.0 in | Wt 170.8 lb

## 2019-05-08 DIAGNOSIS — I2510 Atherosclerotic heart disease of native coronary artery without angina pectoris: Secondary | ICD-10-CM

## 2019-05-08 DIAGNOSIS — Z951 Presence of aortocoronary bypass graft: Secondary | ICD-10-CM | POA: Diagnosis not present

## 2019-05-08 DIAGNOSIS — E785 Hyperlipidemia, unspecified: Secondary | ICD-10-CM | POA: Diagnosis not present

## 2019-05-08 MED ORDER — TICAGRELOR 90 MG PO TABS: 90.0000 mg | ORAL_TABLET | Freq: Two times a day (BID) | ORAL | 3 refills | Status: DC

## 2019-05-08 NOTE — Patient Instructions (Signed)
Medication Instructions:  Your physician recommends that you continue on your current medications as directed. Please refer to the Current Medication list given to you today.  *If you need a refill on your cardiac medications before your next appointment, please call your pharmacy*  Lab Work: TODAY - CBC, BMET If you have labs (blood work) drawn today and your tests are completely normal, you will receive your results only by: Marland Kitchen MyChart Message (if you have MyChart) OR . A paper copy in the mail If you have any lab test that is abnormal or we need to change your treatment, we will call you to review the results.  Testing/Procedures: None Ordered   Follow-Up: At [facility], you and your health needs are our priority.  As part of our continuing mission to provide you with exceptional heart care, we have created designated Provider Care Teams.  These Care Teams include your primary Cardiologist (physician) and Advanced Practice Providers (APPs -  Physician Assistants and Nurse Practitioners) who all work together to provide you with the care you need, when you need it.  Your next appointment:   On December 1  The format for your next appointment:   In Person  Provider:   Dr. Acie Fredrickson

## 2019-05-08 NOTE — Progress Notes (Signed)
Cardiology Office Note:    Date:  05/08/2019   ID:  Jared Roberts, DOB Jan 22, 1946, MRN [id number]  PCP:  Deland Pretty, MD  Cardiologist:  Shakeia Krus  Electrophysiologist:  None   Referring MD: Deland Pretty, MD   Chief Complaint  Patient presents with  . Coronary Artery Disease    January 31, 2019    Author Slaven Thal is a 73 y.o. male with a hx of  HTN, TIA and carotid stenosis .  Has been having some chest pain  A week ago, mowed the lawn,  Had CP and profound fatigue. Upper left chest ,  Left shoulder and arm,  Left jaw.   Comes and goes ,  Last for seconds - minutes ,   Resolves if he stops to rest.  Associated with dyspnea but also has dyspnea on its own.  Doesn't exercise,  Works in the yard . Can climb 2 flights of stairs - makes him breath hard.   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  Non smoker, Drinks 1 drink per night   March 26, 2019:  Jared Roberts is seen back today for a follow-up office visit.  He recently had a heart catheterization which revealed severe coronary artery disease and he had coronary artery bypass grafting. BP has been variable Lower in the am Higher in the PM Cannot tell when his BP is high or low Chest is sore but no angina  Exercising some - walking around the house .   Has a referral for cardiac rehab  May 08, 2019: Jared Roberts is seen back today for follow-up of his coronary artery disease.  He status post coronary artery bypass grafting.  He had early graft failure and had a PCI of his left circumflex artery. He presented several weeks later with angina  and was found restenosis of his LCx.   He had stenting of his LCX and is now seen for follow up    Still is not feeling well Is really tired.  He is 8 weeks out from his CABG  1 month out from graft closure and PCI 2 weeks out from 2nd PCI .   No angina .   Has chest wall pain   Brought Pressure log with him.  His evening blood pressures were running a little high.   We recently increased his Benicar up to 20 mg twice a day.  Has back pain since his cardiac arrest on Sept. 14.  Was given flexeril which helps but makes him sleepy .  Has developed double vision starting a week ago.   Has Right cranial nerve IV palsy .    Has continued back pain    Past Medical History:  Diagnosis Date  . Coronary artery disease   . DDD (degenerative disc disease), cervical   . Depression   . ED (erectile dysfunction) of organic origin   . Generalized osteoarthritis   . GERD (gastroesophageal reflux disease)   . Hiatal hernia   . History of chronic bronchitis   . History of gastritis    2003  . History of Helicobacter pylori infection    2003  . History of melanoma excision    2015--  right ear  . History of prostate cancer urologist-  dr [name]   2007--  pT2a Nx Mx,  Gleason 3+3, S/P  PROSTATECTOMY  . History of squamous cell carcinoma excision    2011  left knee  . Hyperlipidemia   . Hyperplastic  colon polyp 2009  . Hypertension   . Male hypogonadism   . Melanoma ([place])    left 4th toe  . Nephrolithiasis   . Prostate cancer ([place])   . Ventricular fibrillation ([place]) 04/07/2019   VF, en route witnessed by EMS, defibrillated x 1    Past Surgical History:  Procedure Laterality Date  . AMPUTATION TOE Left 09/07/2015   Procedure: PARTIAL AMPUTATION TOE 4TH LEFT;  Surgeon: [name], MD;  Location: [facility];  Service: Podiatry;  Laterality: Left;  . APPENDECTOMY  1970's  . CORONARY ARTERY BYPASS GRAFT N/A 03/12/2019   Procedure: CORONARY ARTERY BYPASS GRAFTING (CABG) x 3 WITH ENDOSCOPIC HARVESTING OF RIGHT GREATER SAPHENOUS VEIN;  Surgeon: [name], MD;  Location: [place] [facility];  Service: Open Heart Surgery;  Laterality: N/A;  . CORONARY BALLOON ANGIOPLASTY N/A 04/07/2019   Procedure: CORONARY BALLOON ANGIOPLASTY;  Surgeon: [name], MD;  Location: [facility];  Service: Cardiovascular;  Laterality: N/A;  . CORONARY STENT  INTERVENTION N/A 04/21/2019   Procedure: CORONARY STENT INTERVENTION;  Surgeon: [name], MD;  Location: [place] [facility];  Service: Cardiovascular;  Laterality: N/A;  . CYSTO/  CLOT EVACUATION POST PROSTATECTOMY  11-19-2005  . CYSTO/  REMOVAL URETHRAL STONE AND URETHRAL STAPLES  06-02-2010  . CYSTO/  URETEROSCOPIC STONE EXTRACTION  X2  1990's  . KIDNEY STONE SURGERY    . LEFT HEART CATH AND CORONARY ANGIOGRAPHY N/A 02/25/2019   Procedure: LEFT HEART CATH AND CORONARY ANGIOGRAPHY;  Surgeon: [name], MD;  Location: [place];  Service: Cardiovascular;  Laterality: N/A;  . LEFT HEART CATH AND CORS/GRAFTS ANGIOGRAPHY N/A 04/07/2019   Procedure: LEFT HEART CATH AND CORS/GRAFTS ANGIOGRAPHY;  Surgeon: [name], MD;  Location: [facility];  Service: Cardiovascular;  Laterality: N/A;  . LEFT HEART CATH AND CORS/GRAFTS ANGIOGRAPHY N/A 04/21/2019   Procedure: LEFT HEART CATH AND CORS/GRAFTS ANGIOGRAPHY;  Surgeon: [name], MD;  Location: [facility];  Service: Cardiovascular;  Laterality: N/A;  . MELANOMA EXCISION  2015   right ear  . ROBOT ASSISTED LAPAROSCOPIC RADICAL PROSTATECTOMY  11-09-2005  . SKIN CANCER EXCISION    . TEE WITHOUT CARDIOVERSION N/A 03/12/2019   Procedure: TRANSESOPHAGEAL ECHOCARDIOGRAM (TEE);  Surgeon: [name], MD;  Location: [place];  Service: Open Heart Surgery;  Laterality: N/A;  . TONSILLECTOMY  child    Current Medications: Current Meds  Medication Sig  . acetaminophen (TYLENOL) 500 MG tablet Take 1 tablet (500 mg total) by mouth every 4 (four) hours as needed for mild pain.  Marland Kitchen ascorbic acid (VITAMIN C) 1000 MG tablet Take 1,000 mg by mouth daily.  Marland Kitchen aspirin EC 81 MG EC tablet Take 1 tablet (81 mg total) by mouth daily.  Marland Kitchen atorvastatin (LIPITOR) 10 MG tablet Take 10 mg by mouth at bedtime.   . Cholecalciferol (VITAMIN D) 2000 units CAPS Take 2,000 Units by mouth 2 (two) times a day.   .  cyclobenzaprine (FLEXERIL) 10 MG tablet Take 1 tablet (10 mg total) by mouth 3 (three) times daily as needed for muscle spasms.  . isosorbide mononitrate (IMDUR) 30 MG 24 hr tablet Take 0.5 tablets (15 mg total) by mouth daily.  . metoprolol tartrate (LOPRESSOR) 50 MG tablet Take 1.5 tablets (75 mg total) by mouth 2 (two) times daily.  . Multiple Vitamin (MULTIVITAMIN WITH MINERALS) TABS tablet Take 1 tablet by mouth daily.  . nitroGLYCERIN (NITROSTAT) 0.4 MG SL tablet  Place 1 tablet (0.4 mg total) under the tongue every 5 (five) minutes x 3 doses as needed for chest pain.  Marland Kitchen olmesartan (BENICAR) 20 MG tablet Take 1 tablet (20 mg total) by mouth 2 (two) times daily.  . Probiotic Product (PROBIOTIC DAILY PO) Take 1 capsule by mouth daily.   . ticagrelor (BRILINTA) 90 MG TABS tablet Take 1 tablet (90 mg total) by mouth 2 (two) times daily.  . traMADol (ULTRAM) 50 MG tablet Take 1 tablet (50 mg total) by mouth every 4 (four) hours as needed for moderate pain.  . [DISCONTINUED] ticagrelor (BRILINTA) 90 MG TABS tablet Take 1 tablet (90 mg total) by mouth 2 (two) times daily.     Allergies:   Patient has no known allergies.   Social History   Socioeconomic History  . Marital status: Married    Spouse name: Not on file  . Number of children: Not on file  . Years of education: Not on file  . Highest education level: Not on file  Occupational History  . Not on file  Social Needs  . Financial resource strain: Not on file  . Food insecurity    Worry: Not on file    Inability: Not on file  . Transportation needs    Medical: Not on file    Non-medical: Not on file  Tobacco Use  . Smoking status: Never Smoker  . Smokeless tobacco: Never Used  Substance and Sexual Activity  . Alcohol use: Yes    Comment: 1 drink daily  . Drug use: No  . Sexual activity: Not on file    Comment: retired, married. 1 son  Lifestyle  . Physical activity    Days per week: Not on file    Minutes per session:  Not on file  . Stress: Not on file  Relationships  . Social Herbalist on phone: Not on file    Gets together: Not on file    Attends religious service: Not on file    Active member of club or organization: Not on file    Attends meetings of clubs or organizations: Not on file    Relationship status: Not on file  Other Topics Concern  . Not on file  Social History Narrative  . Not on file     Family History: The patient's family history includes Arthritis in his mother; Congestive Heart Failure in his mother; Diabetes Mellitus II in his mother; Osteoarthritis in his mother; Other in his father.  ROS:   Please see the history of present illness.     All other systems reviewed and are negative.  EKGs/Labs/Other Studies Reviewed:    The following studies were reviewed today:   EKG:     Recent Labs: 03/05/2019: ALT 20 04/07/2019: Magnesium 2.2 04/22/2019: BUN 12; Creatinine, Ser 0.98; Hemoglobin 12.4; Platelets 248; Potassium 3.4; Sodium 139  Recent Lipid Panel    Component Value Date/Time   CHOL 147 04/08/2019 0300   TRIG 187 (H) 04/08/2019 0300   HDL 43 04/08/2019 0300   CHOLHDL 3.4 04/08/2019 0300   VLDL 37 04/08/2019 0300   LDLCALC 67 04/08/2019 0300    Physical Exam:     Physical Exam: Blood pressure 108/72, pulse 98, height 5\' 10"  (1.778 m), weight 170 lb 12.8 oz (77.5 kg), SpO2 97 %.  GEN:  Well nourished, well developed in no acute distress HEENT: Normal NECK: No JVD; No carotid bruits LYMPHATICS: No lymphadenopathy CARDIAC: RRR  RESPIRATORY:  Clear to auscultation without rales, wheezing or rhonchi  ABDOMEN: Soft, non-tender, non-distended MUSCULOSKELETAL:  No edema; No deformity  SKIN: Warm and dry NEUROLOGIC:  Alert and oriented x 3    ASSESSMENT:    1. Coronary artery disease involving native coronary artery of native heart without angina pectoris   2. Hyperlipidemia LDL goal <70   3. S/P CABG x 3    PLAN:    In order of problems  listed above:  1. CAD :   Has had a complicated course over the past month.  He is had bypass surgery 6 weeks ago.  He had a PCI 1 month ago and a repeat PCI 2 weeks ago.  He is still on Brilinta and aspirin.  I have recommended that he continue Brilinta at least for now.  If we can get 3 months of Brilinta then I think we should be able to safely change to Plavix.   He seems to be stable at this point.  I have recommended that he start back on cardiac rehab in the next week or so.   2.  HTN: BP has been ok   3.  Hyperlipidemia: cont atrovastatin   4.   Back pain  - has been present since his cardiac arrest  May need CT or MRI.   rec ha   Medication Adjustments/Labs and Tests Ordered: Current medicines are reviewed at length with the patient today.  Concerns regarding medicines are outlined above.  Orders Placed This Encounter  Procedures  . CBC  . Basic Metabolic Panel (BMET)  . AMB referral to cardiac rehabilitation   Meds ordered this encounter  Medications  . ticagrelor (BRILINTA) 90 MG TABS tablet    Sig: Take 1 tablet (90 mg total) by mouth 2 (two) times daily.    Dispense:  180 tablet    Refill:  3     Patient Instructions  Medication Instructions:  Your physician recommends that you continue on your current medications as directed. Please refer to the Current Medication list given to you today.  *If you need a refill on your cardiac medications before your next appointment, please call your pharmacy*  Lab Work: TODAY - CBC, BMET If you have labs (blood work) drawn today and your tests are completely normal, you will receive your results only by: Marland Kitchen MyChart Message (if you have MyChart) OR . A paper copy in the mail If you have any lab test that is abnormal or we need to change your treatment, we will call you to review the results.  Testing/Procedures: None Ordered   Follow-Up: At [facility], you and your health needs are our priority.  As part of our  continuing mission to provide you with exceptional heart care, we have created designated Provider Care Teams.  These Care Teams include your primary Cardiologist (physician) and Advanced Practice Providers (APPs -  Physician Assistants and Nurse Practitioners) who all work together to provide you with the care you need, when you need it.  Your next appointment:   On December 1  The format for your next appointment:   In Person  Provider:   Dr. Acie Fredrickson      Signed, Mertie Moores, MD  05/08/2019 4:26 PM    [place]

## 2019-05-08 NOTE — Telephone Encounter (Signed)
Pt c/o BP issue: STAT if pt c/o blurred vision, one-sided weakness or slurred speech  1. What are your last 5 BP readings? 90/70 then 93/71 HR 120 3:30pm   2. Are you having any other symptoms (ex. Dizziness, headache, blurred vision, passed out)? No 3. What is your BP issue? BP running low and HR high. Patient was hear this morning

## 2019-05-08 NOTE — Telephone Encounter (Signed)
Spoke with patient who states he went to the grocery store after leaving the office today and then ate lunch after arriving at home. Shortly after, he was awakened from a nap with a HR of 120 bpm. States he did not feel particularly bad but heart was racing. Took NTG and metoprolol 50 mg and then had low BP readings following.  BP 123/82 mmHg, pulse 108 bpm currently. Patient states he does not feel poor. I asked if he has seen notification of irregular HR on his home BP cuff and he states it did not come up today but he has seen in the recent past. Dr. Acie Fredrickson offered patient to come into office tomorrow morning and patient agrees. I advised him to continue to monitor and that he does not have to seek emergency medical care unless his HR is >120 bpm for an extended period of time. He is scheduled to see Dr. Acie Fredrickson at 10:00 am tomorrow. He thanked me for the call.

## 2019-05-09 ENCOUNTER — Ambulatory Visit: Payer: Medicare HMO | Admitting: Cardiovascular Disease

## 2019-05-09 ENCOUNTER — Other Ambulatory Visit: Payer: Self-pay

## 2019-05-09 ENCOUNTER — Encounter: Payer: Self-pay | Admitting: Cardiovascular Disease

## 2019-05-09 VITALS — BP 104/78 | HR 84 | Ht 70.0 in | Wt 170.0 lb

## 2019-05-09 DIAGNOSIS — R000 Tachycardia, unspecified: Secondary | ICD-10-CM | POA: Diagnosis not present

## 2019-05-09 DIAGNOSIS — I2510 Atherosclerotic heart disease of native coronary artery without angina pectoris: Secondary | ICD-10-CM

## 2019-05-09 DIAGNOSIS — I214 Non-ST elevation (NSTEMI) myocardial infarction: Secondary | ICD-10-CM | POA: Diagnosis not present

## 2019-05-09 DIAGNOSIS — Z23 Encounter for immunization: Secondary | ICD-10-CM | POA: Diagnosis not present

## 2019-05-09 LAB — CBC
Hematocrit: 47.1 % (ref 37.5–51.0)
Hemoglobin: 16.3 g/dL (ref 13.0–17.7)
MCH: 29.5 pg (ref 26.6–33.0)
MCHC: 34.6 g/dL (ref 31.5–35.7)
MCV: 85 fL (ref 79–97)
Platelets: 285 10*3/uL (ref 150–450)
RBC: 5.53 x10E6/uL (ref 4.14–5.80)
RDW: 12.6 % (ref 11.6–15.4)
WBC: 9.9 10*3/uL (ref 3.4–10.8)

## 2019-05-09 LAB — BASIC METABOLIC PANEL
BUN/Creatinine Ratio: 12 (ref 10–24)
BUN: 16 mg/dL (ref 8–27)
CO2: 21 mmol/L (ref 20–29)
Calcium: 10 mg/dL (ref 8.6–10.2)
Chloride: 105 mmol/L (ref 96–106)
Creatinine, Ser: 1.31 mg/dL — ABNORMAL HIGH (ref 0.76–1.27)
GFR calc Af Amer: 61 mL/min/{1.73_m2} (ref >59)
GFR calc non Af Amer: 53 mL/min/{1.73_m2} — ABNORMAL LOW (ref >59)
Glucose: 103 mg/dL — ABNORMAL HIGH (ref 65–99)
Potassium: 4.8 mmol/L (ref 3.5–5.2)
Sodium: 141 mmol/L (ref 134–144)

## 2019-05-09 MED ORDER — PROPRANOLOL HCL 10 MG PO TABS: 10.0000 mg | ORAL_TABLET | Freq: Four times a day (QID) | ORAL | 5 refills | Status: DC | PRN

## 2019-05-09 NOTE — Patient Instructions (Addendum)
Medication Instructions:  Your physician has recommended you make the following change in your medication:   START: propranolol 10 mg tablet: Take 1 tablet four times a day AS NEEDED for palpitations   Lab work: None Ordered  If you have labs (blood work) drawn today and your tests are completely normal, you will receive your results only by: Marland Kitchen MyChart Message (if you have MyChart) OR . A paper copy in the mail If you have any lab test that is abnormal or we need to change your treatment, we will call you to review the results.  Testing/Procedures: None ordered  Follow-Up: . Keep appointment with Dr. Acie Fredrickson on 06/26/19 at 11:00 AM  Any Other Special Instructions Will Be Listed Below (If Applicable).

## 2019-05-09 NOTE — Progress Notes (Signed)
Cardiology Office Note:    Date:  05/09/2019   ID:  Jared Roberts, DOB [birth year], MRN [id number]  PCP:  Deland Pretty, MD  Cardiologist:    Electrophysiologist:  None   Referring MD: Deland Pretty, MD   No chief complaint on file.   January 31, 2019    Jared Roberts is a 73 y.o. male with a hx of  HTN, TIA and carotid stenosis .  Has been having some chest pain  A week ago, mowed the lawn,  Had CP and profound fatigue. Upper left chest ,  Left shoulder and arm,  Left jaw.   Comes and goes ,  Last for seconds - minutes ,   Resolves if he stops to rest.  Associated with dyspnea but also has dyspnea on its own.  Doesn't exercise,  Works in the yard . Can climb 2 flights of stairs - makes him breath hard.   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  Non smoker, Drinks 1 drink per night   March 26, 2019:  Jared Landmark is seen back today for a follow-up office visit.  He recently had a heart catheterization which revealed severe coronary artery disease and he had coronary artery bypass grafting. BP has been variable Lower in the am Higher in the PM Cannot tell when his BP is high or low Chest is sore but no angina  Exercising some - walking around the house .   Has a referral for cardiac rehab  May 08, 2019: Jared Landmark is seen back today for follow-up of his coronary artery disease.  He status post coronary artery bypass grafting.  He had early graft failure and had a PCI of his left circumflex artery. He presented several weeks later with angina  and was found restenosis of his LCx.   He had stenting of his LCX and is now seen for follow up    Still is not feeling well Is really tired.  He is 8 weeks out from his CABG  1 month out from graft closure and PCI 2 weeks out from 2nd PCI .   No angina .   Has chest wall pain   Brought Pressure log with him.  His evening blood pressures were running a little high.  We recently increased his Benicar up to  20 mg twice a day.  Has back pain since his cardiac arrest on Sept. 14.  Was given flexeril which helps but makes him sleepy .  Has developed double vision starting a week ago.   Has Right cranial nerve IV palsy .    Has continued back pain   May 09, 2019: Was seen yesterday late morning Left the office, went to [employer] very poorly  Took a nap.  Woke up,  HR was 120  Took a SL NTG .   Measured BP which was low  Took metoprolol 50 mg ,   Eventually felt better.  Review of labs from yesterday suggest volume depletion Hb is normal ECG is normal sinus rhythm  today  Will give him propranolol to take as needed.   Past Medical History:  Diagnosis Date  . Coronary artery disease   . DDD (degenerative disc disease), cervical   . Depression   . ED (erectile dysfunction) of organic origin   . Generalized osteoarthritis   . GERD (gastroesophageal reflux disease)   . Hiatal hernia   . History of chronic bronchitis   . History  of gastritis    2003  . History of Helicobacter pylori infection    2003  . History of melanoma excision    2015--  right ear  . History of prostate cancer urologist-  dr [name]   2007--  pT2a Nx Mx,  Gleason 3+3, S/P  PROSTATECTOMY  . History of squamous cell carcinoma excision    2011  left knee  . Hyperlipidemia   . Hyperplastic colon polyp 2009  . Hypertension   . Male hypogonadism   . Melanoma ([place])    left 4th toe  . Nephrolithiasis   . Prostate cancer ([place])   . Ventricular fibrillation ([place]) 04/07/2019   VF, en route witnessed by EMS, defibrillated x 1    Past Surgical History:  Procedure Laterality Date  . AMPUTATION TOE Left 09/07/2015   Procedure: PARTIAL AMPUTATION TOE 4TH LEFT;  Surgeon: [name], MD;  Location: [facility];  Service: Podiatry;  Laterality: Left;  . APPENDECTOMY  1970's  . CORONARY ARTERY BYPASS GRAFT N/A 03/12/2019   Procedure: CORONARY ARTERY BYPASS GRAFTING (CABG) x 3 WITH ENDOSCOPIC  HARVESTING OF RIGHT GREATER SAPHENOUS VEIN;  Surgeon: [name], MD;  Location: [facility];  Service: Open Heart Surgery;  Laterality: N/A;  . CORONARY BALLOON ANGIOPLASTY N/A 04/07/2019   Procedure: CORONARY BALLOON ANGIOPLASTY;  Surgeon: [name], MD;  Location: [facility];  Service: Cardiovascular;  Laterality: N/A;  . CORONARY STENT INTERVENTION N/A 04/21/2019   Procedure: CORONARY STENT INTERVENTION;  Surgeon: [name], MD;  Location: [facility];  Service: Cardiovascular;  Laterality: N/A;  . CYSTO/  CLOT EVACUATION POST PROSTATECTOMY  11-19-2005  . CYSTO/  REMOVAL URETHRAL STONE AND URETHRAL STAPLES  06-02-2010  . CYSTO/  URETEROSCOPIC STONE EXTRACTION  X2  1990's  . KIDNEY STONE SURGERY    . LEFT HEART CATH AND CORONARY ANGIOGRAPHY N/A 02/25/2019   Procedure: LEFT HEART CATH AND CORONARY ANGIOGRAPHY;  Surgeon: [name], MD;  Location: [facility];  Service: Cardiovascular;  Laterality: N/A;  . LEFT HEART CATH AND CORS/GRAFTS ANGIOGRAPHY N/A 04/07/2019   Procedure: LEFT HEART CATH AND CORS/GRAFTS ANGIOGRAPHY;  Surgeon: [name], MD;  Location: [facility];  Service: Cardiovascular;  Laterality: N/A;  . LEFT HEART CATH AND CORS/GRAFTS ANGIOGRAPHY N/A 04/21/2019   Procedure: LEFT HEART CATH AND CORS/GRAFTS ANGIOGRAPHY;  Surgeon: [name], MD;  Location: [facility];  Service: Cardiovascular;  Laterality: N/A;  . MELANOMA EXCISION  2015   right ear  . ROBOT ASSISTED LAPAROSCOPIC RADICAL PROSTATECTOMY  11-09-2005  . SKIN CANCER EXCISION    . TEE WITHOUT CARDIOVERSION N/A 03/12/2019   Procedure: TRANSESOPHAGEAL ECHOCARDIOGRAM (TEE);  Surgeon: [name], MD;  Location: [facility];  Service: Open Heart Surgery;  Laterality: N/A;  . TONSILLECTOMY  child    Current Medications: Current Meds  Medication Sig  . acetaminophen (TYLENOL) 500 MG tablet Take 1 tablet (500 mg total) by mouth every 4 (four)  hours as needed for mild pain.  Marland Kitchen ascorbic acid (VITAMIN C) 1000 MG tablet Take 1,000 mg by mouth daily.  Marland Kitchen aspirin EC 81 MG EC tablet Take 1 tablet (81 mg total) by mouth daily.  Marland Kitchen atorvastatin (LIPITOR) 10 MG tablet Take 10 mg by mouth at bedtime.   . Cholecalciferol (VITAMIN D) 2000 units CAPS Take 2,000 Units by mouth 2 (two) times a day.   . cyclobenzaprine (FLEXERIL) 10 MG tablet Take 1 tablet (10  mg total) by mouth 3 (three) times daily as needed for muscle spasms.  . isosorbide mononitrate (IMDUR) 30 MG 24 hr tablet Take 0.5 tablets (15 mg total) by mouth daily.  . metoprolol tartrate (LOPRESSOR) 50 MG tablet Take 1.5 tablets (75 mg total) by mouth 2 (two) times daily.  . Multiple Vitamin (MULTIVITAMIN WITH MINERALS) TABS tablet Take 1 tablet by mouth daily.  . nitroGLYCERIN (NITROSTAT) 0.4 MG SL tablet Place 1 tablet (0.4 mg total) under the tongue every 5 (five) minutes x 3 doses as needed for chest pain.  Marland Kitchen olmesartan (BENICAR) 20 MG tablet Take 1 tablet (20 mg total) by mouth 2 (two) times daily.  . Probiotic Product (PROBIOTIC DAILY PO) Take 1 capsule by mouth daily.   . ticagrelor (BRILINTA) 90 MG TABS tablet Take 1 tablet (90 mg total) by mouth 2 (two) times daily.  . traMADol (ULTRAM) 50 MG tablet Take 1 tablet (50 mg total) by mouth every 4 (four) hours as needed for moderate pain.     Allergies:   Patient has no known allergies.   Social History   Socioeconomic History  . Marital status: Married    Spouse name: Not on file  . Number of children: Not on file  . Years of education: Not on file  . Highest education level: Not on file  Occupational History  . Not on file  Social Needs  . Financial resource strain: Not on file  . Food insecurity    Worry: Not on file    Inability: Not on file  . Transportation needs    Medical: Not on file    Non-medical: Not on file  Tobacco Use  . Smoking status: Never Smoker  . Smokeless tobacco: Never Used  Substance and Sexual  Activity  . Alcohol use: Yes    Comment: 1 drink daily  . Drug use: No  . Sexual activity: Not on file    Comment: retired, married. 1 son  Lifestyle  . Physical activity    Days per week: Not on file    Minutes per session: Not on file  . Stress: Not on file  Relationships  . Social Herbalist on phone: Not on file    Gets together: Not on file    Attends religious service: Not on file    Active member of club or organization: Not on file    Attends meetings of clubs or organizations: Not on file    Relationship status: Not on file  Other Topics Concern  . Not on file  Social History Narrative  . Not on file     Family History: The patient's family history includes Arthritis in his mother; Congestive Heart Failure in his mother; Diabetes Mellitus II in his mother; Osteoarthritis in his mother; Other in his father.  ROS:   Please see the history of present illness.     All other systems reviewed and are negative.  EKGs/Labs/Other Studies Reviewed:    The following studies were reviewed today:   EKG:     Recent Labs: 03/05/2019: ALT 20 04/07/2019: Magnesium 2.2 05/08/2019: BUN 16; Creatinine, Ser 1.31; Hemoglobin 16.3; Platelets 285; Potassium 4.8; Sodium 141  Recent Lipid Panel    Component Value Date/Time   CHOL 147 04/08/2019 0300   TRIG 187 (H) 04/08/2019 0300   HDL 43 04/08/2019 0300   CHOLHDL 3.4 04/08/2019 0300   VLDL 37 04/08/2019 0300   LDLCALC 67 04/08/2019 0300    Physical  Exam:     Physical Exam: Blood pressure 104/78, pulse 84, height 5\' 10"  (1.778 m), weight 170 lb (77.1 kg), SpO2 97 %.  GEN:  Well nourished, well developed in no acute distress HEENT: Normal NECK: No JVD; No carotid bruits LYMPHATICS: No lymphadenopathy CARDIAC: RRR , no murmurs, rubs, gallops RESPIRATORY:  Clear to auscultation without rales, wheezing or rhonchi  ABDOMEN: Soft, non-tender, non-distended MUSCULOSKELETAL:  No edema; No deformity  SKIN: Warm and  dry NEUROLOGIC:  Alert and oriented x 3   ASSESSMENT:    1. Coronary artery disease involving native coronary artery of native heart without angina pectoris   2. Tachycardia    PLAN:    In order of problems listed above:  1. CAD :   Has had a complicated course over the past month.  He is had bypass surgery 6 weeks ago.  He had a PCI 1 month ago and a repeat PCI 2 weeks ago.  He is still on Brilinta and aspirin.  I have recommended that he continue Brilinta at least for now.  If we can get 3 months of Brilinta then I think we should be able to safely change to Plavix.   He seems to be stable at this point.  I have recommended that he start back on cardiac rehab in the next week or so.   2.  HTN: BP has been ok   3.  Hyperlipidemia: cont atrovastatin   4.   Back pain  - has been present since his cardiac arrest  May need CT or MRI.   rec ha   Medication Adjustments/Labs and Tests Ordered: Current medicines are reviewed at length with the patient today.  Concerns regarding medicines are outlined above.  Orders Placed This Encounter  Procedures  . EKG 12-Lead   Meds ordered this encounter  Medications  . propranolol (INDERAL) 10 MG tablet    Sig: Take 1 tablet (10 mg total) by mouth 4 (four) times daily as needed (palpitations).    Dispense:  30 tablet    Refill:  5     Patient Instructions  Medication Instructions:  Your physician has recommended you make the following change in your medication:   START: propranolol 10 mg tablet: Take 1 tablet four times a day AS NEEDED for palpitations   Lab work: None Ordered  If you have labs (blood work) drawn today and your tests are completely normal, you will receive your results only by: Marland Kitchen MyChart Message (if you have MyChart) OR . A paper copy in the mail If you have any lab test that is abnormal or we need to change your treatment, we will call you to review the results.  Testing/Procedures: None ordered  Follow-Up:  . Keep appointment with Dr. Acie Fredrickson on 06/26/19 at 11:00 AM  Any Other Special Instructions Will Be Listed Below (If Applicable).       Signed, Mertie Moores, MD  05/09/2019 11:28 AM    [place]

## 2019-05-13 ENCOUNTER — Telehealth (HOSPITAL_COMMUNITY): Payer: Self-pay

## 2019-05-13 ENCOUNTER — Encounter (HOSPITAL_COMMUNITY): Payer: Self-pay

## 2019-05-13 NOTE — Telephone Encounter (Signed)
Attempted to contact pt in regards to CR, LMTCB. ° °Mailed letter. °

## 2019-05-19 ENCOUNTER — Telehealth (HOSPITAL_COMMUNITY): Payer: Self-pay | Admitting: *Deleted

## 2019-05-19 ENCOUNTER — Telehealth (HOSPITAL_COMMUNITY): Payer: Self-pay

## 2019-05-19 ENCOUNTER — Telehealth: Payer: Self-pay | Admitting: Cardiovascular Disease

## 2019-05-19 NOTE — Telephone Encounter (Signed)
New Message     Verdis Frederickson from Cardiac rehab calling stating patient complaining of chest and back soreness and rates it a 3 or 4 and nothing helps.  Patient also states he takes nitroglycerin to control BP.  Verdis Frederickson wants to know if it's ok for him to proceed with orientation and exercise.  Please call her to advise.

## 2019-05-19 NOTE — Telephone Encounter (Signed)
Cardiac Rehab Medication Review by a Pharmacist  Does the patient  feel that his/her medications are working for him/her?  No. Evening blood pressure is much higher than the morning. Morning blood pressure for yesterday was 132/90 and evening was 150/105. Typically morning blood pressure is 130s/80s and evening is "triple digits". Morning medications are at 8-9am and evening medications are 8-9pm and blood pressure readings are a few hours after medication administration.   Has the patient been experiencing any side effects to the medications prescribed?  Yes. In general feels sluggish/slow.  Does the patient measure his/her own blood pressure or blood glucose at home?  Yes (see above).    Does the patient have any problems obtaining medications due to transportation or finances?   No   Understanding of regimen: good Understanding of indications: good Potential of compliance: good   Pharmacist comments: N/A  Cristela Felt, PharmD PGY1 Pharmacy Resident [employer]: [phone]  05/19/2019 9:56 AM

## 2019-05-19 NOTE — Telephone Encounter (Signed)
Spoke with the patient confirmed tomorrow's for orientation. Health history completed. Patient reports that he continues to have chest soreness that radiates to his back since his most recent hospitalization on 03/2819. Patient says that its in his diaphragm and under his left rib. Mr Coury does not feel that its related to his heart but the patient reports that he feels this soreness all the time and that it never goes away. Mr Surges says that his pain level is a 3-4 on a 0-10 scale. Mr Mcmurtry says nothing that he takes makes it goes away. Mr Sherrod also says that he took a sublingual nitroglycerin 3-4 days ago to lower his blood pressure. Dr Mariel Kansky office called and notified to call the patient. Will make sure that its okay for the patient to proceed with exercise.Barnet Pall, RN,BSN 05/19/2019 4:07 PM

## 2019-05-19 NOTE — Telephone Encounter (Signed)
Dr. Acie Fredrickson has spoken with Verdis Frederickson and advised that patient may proceed with cardiac rehab

## 2019-05-20 ENCOUNTER — Encounter (HOSPITAL_COMMUNITY)
Admission: RE | Admit: 2019-05-20 | Discharge: 2019-05-20 | Disposition: A | Discharge status: Home / Self Care | Payer: Medicare HMO | Source: Ambulatory Visit | Attending: Cardiovascular Disease | Admitting: Cardiovascular Disease

## 2019-05-20 ENCOUNTER — Encounter (HOSPITAL_COMMUNITY): Payer: Self-pay

## 2019-05-20 ENCOUNTER — Telehealth (HOSPITAL_COMMUNITY): Payer: Self-pay | Admitting: *Deleted

## 2019-05-20 ENCOUNTER — Other Ambulatory Visit: Payer: Self-pay

## 2019-05-20 VITALS — BP 130/90 | HR 94 | Temp 96.8°F | Ht 69.5 in | Wt 171.7 lb

## 2019-05-20 DIAGNOSIS — I214 Non-ST elevation (NSTEMI) myocardial infarction: Secondary | ICD-10-CM

## 2019-05-20 DIAGNOSIS — Z951 Presence of aortocoronary bypass graft: Secondary | ICD-10-CM

## 2019-05-20 DIAGNOSIS — Z955 Presence of coronary angioplasty implant and graft: Secondary | ICD-10-CM

## 2019-05-20 NOTE — Progress Notes (Signed)
Cardiac Individual Treatment Plan  Patient Details  Name: Jared Roberts MRN: [id number] Date of Birth: 1946-04-16 Referring Provider:     [facility] from 05/20/2019 in [place]  Referring Provider  Nahser, Wonda Cheng, MD      Initial Encounter Date:    CARDIAC REHAB PHASE II ORIENTATION from 05/20/2019 in [place]  Date  05/20/19      Visit Diagnosis: 04/21/19 NSTEMI  04/21/19 DES mCx  03/12/19 CABG x 3  Patient's Home Medications on Admission:  Current Outpatient Medications:  .  acetaminophen (TYLENOL) 500 MG tablet, Take 1 tablet (500 mg total) by mouth every 4 (four) hours as needed for mild pain., Disp: 30 tablet, Rfl: 0 .  ascorbic acid (VITAMIN C) 1000 MG tablet, Take 1,000 mg by mouth daily., Disp: , Rfl:  .  aspirin EC 81 MG EC tablet, Take 1 tablet (81 mg total) by mouth daily., Disp:  , Rfl:  .  atorvastatin (LIPITOR) 10 MG tablet, Take 10 mg by mouth at bedtime. , Disp: , Rfl:  .  Cholecalciferol (VITAMIN D) 2000 units CAPS, Take 2,000 Units by mouth 2 (two) times a day. , Disp: , Rfl:  .  isosorbide mononitrate (IMDUR) 30 MG 24 hr tablet, Take 0.5 tablets (15 mg total) by mouth daily. (Patient taking differently: Take 15 mg by mouth 2 (two) times daily. ), Disp: 30 tablet, Rfl: 0 .  metoprolol tartrate (LOPRESSOR) 50 MG tablet, Take 1.5 tablets (75 mg total) by mouth 2 (two) times daily., Disp: 270 tablet, Rfl: 3 .  Multiple Vitamin (MULTIVITAMIN WITH MINERALS) TABS tablet, Take 1 tablet by mouth daily., Disp: , Rfl:  .  olmesartan (BENICAR) 20 MG tablet, Take 1 tablet (20 mg total) by mouth 2 (two) times daily., Disp: 60 tablet, Rfl: 11 .  Probiotic Product (PROBIOTIC DAILY PO), Take 1 capsule by mouth daily. , Disp: , Rfl:  .  propranolol (INDERAL) 10 MG tablet, Take 1 tablet (10 mg total) by mouth 4 (four) times daily as needed (palpitations)., Disp: 30 tablet, Rfl: 5 .   ticagrelor (BRILINTA) 90 MG TABS tablet, Take 1 tablet (90 mg total) by mouth 2 (two) times daily., Disp: 180 tablet, Rfl: 3 .  cyclobenzaprine (FLEXERIL) 10 MG tablet, Take 1 tablet (10 mg total) by mouth 3 (three) times daily as needed for muscle spasms. (Patient not taking: Reported on 05/19/2019), Disp: 8 tablet, Rfl: 0 .  nitroGLYCERIN (NITROSTAT) 0.4 MG SL tablet, Place 1 tablet (0.4 mg total) under the tongue every 5 (five) minutes x 3 doses as needed for chest pain. (Patient not taking: Reported on 05/19/2019), Disp: 25 tablet, Rfl: 0 .  traMADol (ULTRAM) 50 MG tablet, Take 1 tablet (50 mg total) by mouth every 4 (four) hours as needed for moderate pain. (Patient not taking: Reported on 05/19/2019), Disp: 28 tablet, Rfl: 0  Past Medical History: Past Medical History:  Diagnosis Date  . Coronary artery disease   . DDD (degenerative disc disease), cervical   . Depression   . ED (erectile dysfunction) of organic origin   . Generalized osteoarthritis   . GERD (gastroesophageal reflux disease)   . Hiatal hernia   . History of chronic bronchitis   . History of gastritis    2003  . History of Helicobacter pylori infection    2003  . History of melanoma excision    2015--  right ear  . History of  prostate cancer urologist-  dr [name]   2007--  pT2a Nx Mx,  Gleason 3+3, S/P  PROSTATECTOMY  . History of squamous cell carcinoma excision    2011  left knee  . Hyperlipidemia   . Hyperplastic colon polyp 2009  . Hypertension   . Male hypogonadism   . Melanoma ([place])    left 4th toe  . Nephrolithiasis   . Prostate cancer ([place])   . Ventricular fibrillation ([place]) 04/07/2019   VF, en route witnessed by EMS, defibrillated x 1    Tobacco Use: Social History   Tobacco Use  Smoking Status Never Smoker  Smokeless Tobacco Never Used    Labs: Recent Review Flowsheet Data    Labs for ITP Cardiac and Pulmonary Rehab Latest Ref Rng & Units 03/12/2019 03/12/2019 03/12/2019 04/07/2019 04/08/2019    Cholestrol 0 - 200 mg/dL - - - - 147   LDLCALC 0 - 99 mg/dL - - - - 67   HDL >40 mg/dL - - - - 43   Trlycerides <150 mg/dL - - - - 187(H)   Hemoglobin A1c 4.8 - 5.6 % - - - - -   PHART 7.350 - 7.450 7.403 7.360 - - -   PCO2ART 32.0 - 48.0 mmHg 30.9(L) 34.7 - - -   HCO3 20.0 - 28.0 mmol/L 19.3(L) 19.6(L) - - -   TCO2 22 - 32 mmol/L 20(L) 21(L) 19(L) 18(L) -   ACIDBASEDEF 0.0 - 2.0 mmol/L 5.0(H) 5.0(H) - - -   O2SAT % 99.0 97.0 - - -      Capillary Blood Glucose: Lab Results  Component Value Date   GLUCAP 111 (H) 03/15/2019   GLUCAP 113 (H) 03/15/2019   GLUCAP 107 (H) 03/14/2019   GLUCAP 149 (H) 03/14/2019   GLUCAP 112 (H) 03/14/2019     Exercise Target Goals: Exercise Program Goal: Individual exercise prescription set using results from initial 6 min walk test and THRR while considering  patient's activity barriers and safety.   Exercise Prescription Goal: Initial exercise prescription builds to 30-45 minutes a day of aerobic activity, 2-3 days per week.  Home exercise guidelines will be given to patient during program as part of exercise prescription that the participant will acknowledge.  Activity Barriers & Risk Stratification: Activity Barriers & Cardiac Risk Stratification - 05/20/19 1403      Activity Barriers & Cardiac Risk Stratification   Activity Barriers  Other (comment)    Comments  Occassional double vision-feels disoriented. Upper body soreness from defibrilation.    Cardiac Risk Stratification  High       6 Minute Walk: 6 Minute Walk    Row Name 05/20/19 1415         6 Minute Walk   Phase  Initial     Distance  1393 feet     Walk Time  6 minutes     # of Rest Breaks  0     MPH  2.64     METS  3.03     RPE  11     Perceived Dyspnea   0     VO2 Peak  10.62     Symptoms  No     Resting HR  94 bpm     Resting BP  130/90     Resting Oxygen Saturation   97 %     Exercise Oxygen Saturation  during 6 min walk  97 %     Max Ex. HR  104 bpm  Max Ex. BP  132/92     2 Minute Post BP  130/90        Oxygen Initial Assessment:   Oxygen Re-Evaluation:   Oxygen Discharge (Final Oxygen Re-Evaluation):   Initial Exercise Prescription: Initial Exercise Prescription - 05/20/19 1500      Date of Initial Exercise RX and Referring Provider   Date  05/20/19    Referring Provider  Nahser, Wonda Cheng, MD    Expected Discharge Date  07/16/19      Recumbant Bike   Level  3    Watts  25    Minutes  15    METs  3      Arm/Foot Ergometer   Level  1    Watts  25    Minutes  15    METs  2.7      Prescription Details   Frequency (times per week)  3    Duration  Progress to 30 minutes of continuous aerobic without signs/symptoms of physical distress      Intensity   THRR 40-80% of Max Heartrate  58-116    Ratings of Perceived Exertion  11-13    Perceived Dyspnea  0-4      Progression   Progression  Continue to progress workloads to maintain intensity without signs/symptoms of physical distress.      Resistance Training   Training Prescription  Yes    Weight  3lbs    Reps  10-15       Perform Capillary Blood Glucose checks as needed.  Exercise Prescription Changes:   Exercise Comments:   Exercise Goals and Review: Exercise Goals    Row Name 05/20/19 1405             Exercise Goals   Increase Physical Activity  Yes       Intervention  Provide advice, education, support and counseling about physical activity/exercise needs.;Develop an individualized exercise prescription for aerobic and resistive training based on initial evaluation findings, risk stratification, comorbidities and participant's personal goals.       Expected Outcomes  Short Term: Attend rehab on a regular basis to increase amount of physical activity.;Long Term: Exercising regularly at least 3-5 days a week.;Long Term: Add in home exercise to make exercise part of routine and to increase amount of physical activity.       Increase Strength and  Stamina  Yes       Intervention  Provide advice, education, support and counseling about physical activity/exercise needs.;Develop an individualized exercise prescription for aerobic and resistive training based on initial evaluation findings, risk stratification, comorbidities and participant's personal goals.       Expected Outcomes  Short Term: Increase workloads from initial exercise prescription for resistance, speed, and METs.;Short Term: Perform resistance training exercises routinely during rehab and add in resistance training at home;Long Term: Improve cardiorespiratory fitness, muscular endurance and strength as measured by increased METs and functional capacity (6MWT)       Able to understand and use rate of perceived exertion (RPE) scale  Yes       Intervention  Provide education and explanation on how to use RPE scale       Expected Outcomes  Short Term: Able to use RPE daily in rehab to express subjective intensity level;Long Term:  Able to use RPE to guide intensity level when exercising independently       Knowledge and understanding of Target Heart Rate Range (THRR)  Yes  Intervention  Provide education and explanation of THRR including how the numbers were predicted and where they are located for reference       Expected Outcomes  Short Term: Able to state/look up THRR;Long Term: Able to use THRR to govern intensity when exercising independently;Short Term: Able to use daily as guideline for intensity in rehab       Able to check pulse independently  Yes       Intervention  Provide education and demonstration on how to check pulse in carotid and radial arteries.;Review the importance of being able to check your own pulse for safety during independent exercise       Expected Outcomes  Short Term: Able to explain why pulse checking is important during independent exercise;Long Term: Able to check pulse independently and accurately       Understanding of Exercise Prescription  Yes        Intervention  Provide education, explanation, and written materials on patient's individual exercise prescription       Expected Outcomes  Short Term: Able to explain program exercise prescription;Long Term: Able to explain home exercise prescription to exercise independently          Exercise Goals Re-Evaluation :   Discharge Exercise Prescription (Final Exercise Prescription Changes):   Nutrition:  Target Goals: Understanding of nutrition guidelines, daily intake of sodium 1500mg , cholesterol 200mg , calories 30% from fat and 7% or less from saturated fats, daily to have 5 or more servings of fruits and vegetables.  Biometrics: Pre Biometrics - 05/20/19 1359      Pre Biometrics   Height  5' 9.5" (1.765 m)    Weight  77.9 kg    Waist Circumference  37.75 inches    Hip Circumference  40.75 inches    Waist to Hip Ratio  0.93 %    BMI (Calculated)  25.01    Triceps Skinfold  17 mm    % Body Fat  26.1 %    Grip Strength  34.5 kg    Flexibility  12 in    Single Leg Stand  30 seconds        Nutrition Therapy Plan and Nutrition Goals:   Nutrition Assessments:   Nutrition Goals Re-Evaluation:   Nutrition Goals Re-Evaluation:   Nutrition Goals Discharge (Final Nutrition Goals Re-Evaluation):   Psychosocial: Target Goals: Acknowledge presence or absence of significant depression and/or stress, maximize coping skills, provide positive support system. Participant is able to verbalize types and ability to use techniques and skills needed for reducing stress and depression.  Initial Review & Psychosocial Screening: Initial Psych Review & Screening - 05/20/19 1520      Initial Review   Current issues with  None Identified      Family Dynamics   Good Support System?  Yes    Comments  Patient denies psychosocial barriers to participation in CR. He acknolodges having a strong support system including family, friends and his health care team. He has a positive attitude  and is beginning to have a positive outlook regarding his health and recovery from his failed coronary graphs and MI x 2 post surgery.      Barriers   Psychosocial barriers to participate in program  There are no identifiable barriers or psychosocial needs.      Screening Interventions   Interventions  Encouraged to exercise       Quality of Life Scores: Quality of Life - 05/20/19 1529      Quality of Life  Select  Quality of Life      Quality of Life Scores   Health/Function Pre  20.07 %    Socioeconomic Pre  24.58 %    Psych/Spiritual Pre  24.64 %    Family Pre  17.4 %    GLOBAL Pre  21.45 %      Scores of 19 and below usually indicate a poorer quality of life in these areas.  A difference of  2-3 points is a clinically meaningful difference.  A difference of 2-3 points in the total score of the Quality of Life Index has been associated with significant improvement in overall quality of life, self-image, physical symptoms, and general health in studies assessing change in quality of life.  PHQ-9: Recent Review Flowsheet Data    Depression screen [facility] 2/9 05/20/2019   Decreased Interest 0   Down, Depressed, Hopeless 0   PHQ - 2 Score 0     Interpretation of Total Score  Total Score Depression Severity:  1-4 = Minimal depression, 5-9 = Mild depression, 10-14 = Moderate depression, 15-19 = Moderately severe depression, 20-27 = Severe depression   Psychosocial Evaluation and Intervention:   Psychosocial Re-Evaluation:   Psychosocial Discharge (Final Psychosocial Re-Evaluation):   Vocational Rehabilitation: Provide vocational rehab assistance to qualifying candidates.   Vocational Rehab Evaluation & Intervention: Vocational Rehab - 05/20/19 1523      Initial Vocational Rehab Evaluation & Intervention   Assessment shows need for Vocational Rehabilitation  No      Vocational Rehab Re-Evaulation   Comments  Patient is retired       Education: Education Goals:  Education classes will be provided on a weekly basis, covering required topics. Participant will state understanding/return demonstration of topics presented.  Learning Barriers/Preferences:   Education Topics: Count Your Pulse:  -Group instruction provided by verbal instruction, demonstration, patient participation and written materials to support subject.  Instructors address importance of being able to find your pulse and how to count your pulse when at home without a heart monitor.  Patients get hands on experience counting their pulse with staff help and individually.   Heart Attack, Angina, and Risk Factor Modification:  -Group instruction provided by verbal instruction, video, and written materials to support subject.  Instructors address signs and symptoms of angina and heart attacks.    Also discuss risk factors for heart disease and how to make changes to improve heart health risk factors.   Functional Fitness:  -Group instruction provided by verbal instruction, demonstration, patient participation, and written materials to support subject.  Instructors address safety measures for doing things around the house.  Discuss how to get up and down off the floor, how to pick things up properly, how to safely get out of a chair without assistance, and balance training.   Meditation and Mindfulness:  -Group instruction provided by verbal instruction, patient participation, and written materials to support subject.  Instructor addresses importance of mindfulness and meditation practice to help reduce stress and improve awareness.  Instructor also leads participants through a meditation exercise.    Stretching for Flexibility and Mobility:  -Group instruction provided by verbal instruction, patient participation, and written materials to support subject.  Instructors lead participants through series of stretches that are designed to increase flexibility thus improving mobility.  These  stretches are additional exercise for major muscle groups that are typically performed during regular warm up and cool down.   Hands Only CPR:  -Group verbal, video, and participation provides a basic  overview of AHA guidelines for community CPR. Role-play of emergencies allow participants the opportunity to practice calling for help and chest compression technique with discussion of AED use.   Hypertension: -Group verbal and written instruction that provides a basic overview of hypertension including the most recent diagnostic guidelines, risk factor reduction with self-care instructions and medication management.    Nutrition I class: Heart Healthy Eating:  -Group instruction provided by PowerPoint slides, verbal discussion, and written materials to support subject matter. The instructor gives an explanation and review of the Therapeutic Lifestyle Changes diet recommendations, which includes a discussion on lipid goals, dietary fat, sodium, fiber, plant stanol/sterol esters, sugar, and the components of a well-balanced, healthy diet.   Nutrition II class: Lifestyle Skills:  -Group instruction provided by PowerPoint slides, verbal discussion, and written materials to support subject matter. The instructor gives an explanation and review of label reading, grocery shopping for heart health, heart healthy recipe modifications, and ways to make healthier choices when eating out.   Diabetes Question & Answer:  -Group instruction provided by PowerPoint slides, verbal discussion, and written materials to support subject matter. The instructor gives an explanation and review of diabetes co-morbidities, pre- and post-prandial blood glucose goals, pre-exercise blood glucose goals, signs, symptoms, and treatment of hypoglycemia and hyperglycemia, and foot care basics.   Diabetes Blitz:  -Group instruction provided by PowerPoint slides, verbal discussion, and written materials to support subject  matter. The instructor gives an explanation and review of the physiology behind type 1 and type 2 diabetes, diabetes medications and rational behind using different medications, pre- and post-prandial blood glucose recommendations and Hemoglobin A1c goals, diabetes diet, and exercise including blood glucose guidelines for exercising safely.    Portion Distortion:  -Group instruction provided by PowerPoint slides, verbal discussion, written materials, and food models to support subject matter. The instructor gives an explanation of serving size versus portion size, changes in portions sizes over the last 20 years, and what consists of a serving from each food group.   Stress Management:  -Group instruction provided by verbal instruction, video, and written materials to support subject matter.  Instructors review role of stress in heart disease and how to cope with stress positively.     Exercising on Your Own:  -Group instruction provided by verbal instruction, power point, and written materials to support subject.  Instructors discuss benefits of exercise, components of exercise, frequency and intensity of exercise, and end points for exercise.  Also discuss use of nitroglycerin and activating EMS.  Review options of places to exercise outside of rehab.  Review guidelines for sex with heart disease.   Cardiac Drugs I:  -Group instruction provided by verbal instruction and written materials to support subject.  Instructor reviews cardiac drug classes: antiplatelets, anticoagulants, beta blockers, and statins.  Instructor discusses reasons, side effects, and lifestyle considerations for each drug class.   Cardiac Drugs II:  -Group instruction provided by verbal instruction and written materials to support subject.  Instructor reviews cardiac drug classes: angiotensin converting enzyme inhibitors (ACE-I), angiotensin II receptor blockers (ARBs), nitrates, and calcium channel blockers.  Instructor  discusses reasons, side effects, and lifestyle considerations for each drug class.   Anatomy and Physiology of the Circulatory System:  Group verbal and written instruction and models provide basic cardiac anatomy and physiology, with the coronary electrical and arterial systems. Review of: AMI, Angina, Valve disease, Heart Failure, Peripheral Artery Disease, Cardiac Arrhythmia, Pacemakers, and the ICD.   Other Education:  -Group or individual  verbal, written, or video instructions that support the educational goals of the cardiac rehab program.   Holiday Eating Survival Tips:  -Group instruction provided by PowerPoint slides, verbal discussion, and written materials to support subject matter. The instructor gives patients tips, tricks, and techniques to help them not only survive but enjoy the holidays despite the onslaught of food that accompanies the holidays.   Knowledge Questionnaire Score: Knowledge Questionnaire Score - 05/20/19 1529      Knowledge Questionnaire Score   Pre Score  21/24       Core Components/Risk Factors/Patient Goals at Admission: Personal Goals and Risk Factors at Admission - 05/20/19 1530      Core Components/Risk Factors/Patient Goals on Admission   Hypertension  Yes    Intervention  Provide education on lifestyle modifcations including regular physical activity/exercise, weight management, moderate sodium restriction and increased consumption of fresh fruit, vegetables, and low fat dairy, alcohol moderation, and smoking cessation.;Monitor prescription use compliance.    Expected Outcomes  Short Term: Continued assessment and intervention until BP is < 140/4mm HG in hypertensive participants. < 130/14mm HG in hypertensive participants with diabetes, heart failure or chronic kidney disease.;Long Term: Maintenance of blood pressure at goal levels.    Lipids  Yes    Intervention  Provide education and support for participant on nutrition & aerobic/resistive  exercise along with prescribed medications to achieve LDL 70mg , HDL >40mg .    Expected Outcomes  Short Term: Participant states understanding of desired cholesterol values and is compliant with medications prescribed. Participant is following exercise prescription and nutrition guidelines.;Long Term: Cholesterol controlled with medications as prescribed, with individualized exercise RX and with personalized nutrition plan. Value goals: LDL < 70mg , HDL > 40 mg.    Stress  Yes    Intervention  Offer individual and/or small group education and counseling on adjustment to heart disease, stress management and health-related lifestyle change. Teach and support self-help strategies.;Refer participants experiencing significant psychosocial distress to appropriate mental health specialists for further evaluation and treatment. When possible, include family members and significant others in education/counseling sessions.    Expected Outcomes  Short Term: Participant demonstrates changes in health-related behavior, relaxation and other stress management skills, ability to obtain effective social support, and compliance with psychotropic medications if prescribed.;Long Term: Emotional wellbeing is indicated by absence of clinically significant psychosocial distress or social isolation.       Core Components/Risk Factors/Patient Goals Review:    Core Components/Risk Factors/Patient Goals at Discharge (Final Review):    ITP Comments: ITP Comments    Row Name 05/20/19 1347           ITP Comments  Medical Director- Dr. Fransico Him, MD          Comments: Patient attended orientation on 05/20/2019 to review rules and guidelines for program.  Completed 6 minute walk test, Intitial ITP, and exercise prescription.  VSS. Telemetry-NSR. Asymptomatic. Safety measures and social distancing in place per CDC guidelines.

## 2019-05-20 NOTE — Telephone Encounter (Signed)
Spoke with patient this morning told him that I spoke to Dr Acie Fredrickson yesterday afternoon. Dr Acie Fredrickson says that Jared Roberts is okay to proceed with exercise at cardiac rehab. Dr Acie Fredrickson says he thinks that Jared Roberts pain is mostly musculoskeletal. Jared Roberts also said that forgot to mention that he has been experiencing double vision which he mentioned to Dr Acie Fredrickson. Jared Roberts went to the eye doctor to evaluate.Barnet Pall, RN,BSN 05/20/2019 8:54 AM

## 2019-05-26 ENCOUNTER — Other Ambulatory Visit: Payer: Self-pay

## 2019-05-26 ENCOUNTER — Encounter (HOSPITAL_COMMUNITY)
Admission: RE | Admit: 2019-05-26 | Discharge: 2019-05-26 | Disposition: A | Discharge status: Home / Self Care | Payer: Medicare HMO | Source: Ambulatory Visit | Attending: Cardiovascular Disease | Admitting: Cardiovascular Disease

## 2019-05-26 DIAGNOSIS — Z951 Presence of aortocoronary bypass graft: Secondary | ICD-10-CM | POA: Diagnosis not present

## 2019-05-26 DIAGNOSIS — I214 Non-ST elevation (NSTEMI) myocardial infarction: Secondary | ICD-10-CM

## 2019-05-26 DIAGNOSIS — Z955 Presence of coronary angioplasty implant and graft: Secondary | ICD-10-CM

## 2019-05-26 MED ORDER — ISOSORBIDE MONONITRATE ER 30 MG PO TB24: 15.0000 mg | ORAL_TABLET | Freq: Every day | ORAL | 3 refills | Status: DC

## 2019-05-26 NOTE — Telephone Encounter (Signed)
Pt's medication was sent to pt's pharmacy as requested. Confirmation received.  °

## 2019-05-26 NOTE — Progress Notes (Signed)
Daily Session Note  Patient Details  Name: EMAD BRECHTEL MRN: [id number] Date of Birth: [birth year] Referring Provider:     [facility] from 05/20/2019 in [place]  Referring Provider  Nahser, Wonda Cheng, MD      Encounter Date: 05/26/2019  Check In: Session Check In - 05/26/19 1325      Check-In   Supervising physician immediately available to respond to emergencies  [facility] immediately available    Physician(s)  Dr. Tawanna Solo    Location  [facility]    Staff Present  Barnet Pall, RN, BSN;Brittany Durene Fruits, BS, ACSM CEP, Exercise Physiologist;Tyara Carol Ada, MS,ACSM CEP, Exercise Physiologist    Virtual Visit  No    Medication changes reported      No    Fall or balance concerns reported     No    Tobacco Cessation  No Change    Warm-up and Cool-down  Performed on first and last piece of equipment    Resistance Training Performed  Yes    VAD Patient?  No    PAD/SET Patient?  No      Pain Assessment   Currently in Pain?  No/denies    Multiple Pain Sites  No       Capillary Blood Glucose: No results found for this or any previous visit (from the past 24 hour(s)).    Social History   Tobacco Use  Smoking Status Never Smoker  Smokeless Tobacco Never Used    Goals Met:  Exercise tolerated well  Goals Unmet:  BP Diastolic  Comments: Pt started cardiac rehab today.  Pt tolerated light exercise without difficulty. Blood pressure today are as follows. Entry blood pressure 140/88 heart rate 83. Blood pressure 144/94 heart rate 91 on the nustep. Blood pressure 142/92 on the nustep heart rate 95. Exit heart rate 88. Blood pressure 128/92. , telemetry-Sinus Rhythm, asymptomatic.  Medication list reconciled. Pt denies barriers to medicaiton compliance and says that he is taking his medications as prescribed.. Will send today's blood pressure reading to Dr Elmarie Shiley office for review. Rush Landmark says he  continues to get elevated BP readings at home especially at night. PSYCHOSOCIAL ASSESSMENT:  PHQ-0. Pt exhibits positive coping skills, hopeful outlook with supportive family. No psychosocial needs identified at this time, no psychosocial interventions necessary.    Pt enjoys watching TV.   Pt oriented to exercise equipment and routine.    Understanding verbalized.Barnet Pall, RN,BSN 05/26/2019 2:50 PM   Dr. Fransico Him is Medical Director for Cardiac Rehab at [facility].

## 2019-05-27 ENCOUNTER — Telehealth: Payer: Self-pay

## 2019-05-27 ENCOUNTER — Other Ambulatory Visit: Payer: Self-pay

## 2019-05-27 DIAGNOSIS — Z79899 Other long term (current) drug therapy: Secondary | ICD-10-CM

## 2019-05-27 MED ORDER — POTASSIUM CHLORIDE ER 10 MEQ PO TBCR: 10.0000 meq | EXTENDED_RELEASE_TABLET | Freq: Every day | ORAL | 3 refills | Status: DC

## 2019-05-27 MED ORDER — HYDROCHLOROTHIAZIDE 25 MG PO TABS: 25.0000 mg | ORAL_TABLET | Freq: Every day | ORAL | 3 refills | Status: DC

## 2019-05-27 NOTE — Telephone Encounter (Signed)
-----   Message from Thayer Headings, MD sent at 05/27/2019  9:56 AM EST ----- Please start Jared Roberts on  HCTZ 25 mg a day  Kdur 10 meq a day  Check BMP in 2 weeks  Thanks  ----- Message ----- From: Magda Kiel, RN Sent: 05/26/2019   2:56 PM EST To: Thayer Headings, MD, Emmaline Life, RN  Dr Acie Fredrickson, Jared Roberts completed his first day of exercise and did well. I want to let you know that Jared Roberts diastolic blood pressures ranged from 88-94. Systolic ranged from 123XX123. Jared Roberts says that his blood pressures are elevated when he checks his blood pressures at home. Thanks for your input!  Sincerely, Verdis Frederickson

## 2019-05-27 NOTE — Telephone Encounter (Signed)
Spoke with patient and informed him that Dr. Acie Fredrickson would like to start him on HCTZ 25 mg daily and Kdur 28meq daily. Scheduled patient for lab work in 2 weeks.

## 2019-05-28 ENCOUNTER — Other Ambulatory Visit: Payer: Self-pay

## 2019-05-28 ENCOUNTER — Encounter (HOSPITAL_COMMUNITY)
Admission: RE | Admit: 2019-05-28 | Discharge: 2019-05-28 | Disposition: A | Discharge status: Home / Self Care | Payer: Medicare HMO | Source: Ambulatory Visit | Attending: Cardiovascular Disease | Admitting: Cardiovascular Disease

## 2019-05-28 DIAGNOSIS — Z951 Presence of aortocoronary bypass graft: Secondary | ICD-10-CM

## 2019-05-28 DIAGNOSIS — Z955 Presence of coronary angioplasty implant and graft: Secondary | ICD-10-CM

## 2019-05-28 DIAGNOSIS — I214 Non-ST elevation (NSTEMI) myocardial infarction: Secondary | ICD-10-CM

## 2019-05-30 ENCOUNTER — Other Ambulatory Visit: Payer: Self-pay

## 2019-05-30 ENCOUNTER — Encounter (HOSPITAL_COMMUNITY)
Admission: RE | Admit: 2019-05-30 | Discharge: 2019-05-30 | Disposition: A | Discharge status: Home / Self Care | Payer: Medicare HMO | Source: Ambulatory Visit | Attending: Cardiovascular Disease | Admitting: Cardiovascular Disease

## 2019-05-30 DIAGNOSIS — Z955 Presence of coronary angioplasty implant and graft: Secondary | ICD-10-CM

## 2019-05-30 DIAGNOSIS — Z951 Presence of aortocoronary bypass graft: Secondary | ICD-10-CM

## 2019-05-30 DIAGNOSIS — I214 Non-ST elevation (NSTEMI) myocardial infarction: Secondary | ICD-10-CM

## 2019-06-02 ENCOUNTER — Other Ambulatory Visit: Payer: Self-pay

## 2019-06-02 ENCOUNTER — Encounter (HOSPITAL_COMMUNITY)
Admission: RE | Admit: 2019-06-02 | Discharge: 2019-06-02 | Disposition: A | Discharge status: Home / Self Care | Payer: Medicare HMO | Source: Ambulatory Visit | Attending: Cardiovascular Disease | Admitting: Cardiovascular Disease

## 2019-06-02 DIAGNOSIS — Z951 Presence of aortocoronary bypass graft: Secondary | ICD-10-CM | POA: Diagnosis not present

## 2019-06-02 DIAGNOSIS — Z955 Presence of coronary angioplasty implant and graft: Secondary | ICD-10-CM

## 2019-06-02 DIAGNOSIS — I214 Non-ST elevation (NSTEMI) myocardial infarction: Secondary | ICD-10-CM

## 2019-06-04 ENCOUNTER — Other Ambulatory Visit: Payer: Self-pay

## 2019-06-04 ENCOUNTER — Encounter (HOSPITAL_COMMUNITY)
Admission: RE | Admit: 2019-06-04 | Discharge: 2019-06-04 | Disposition: A | Discharge status: Home / Self Care | Payer: Medicare HMO | Source: Ambulatory Visit | Attending: Cardiovascular Disease | Admitting: Cardiovascular Disease

## 2019-06-04 DIAGNOSIS — Z955 Presence of coronary angioplasty implant and graft: Secondary | ICD-10-CM

## 2019-06-04 DIAGNOSIS — I214 Non-ST elevation (NSTEMI) myocardial infarction: Secondary | ICD-10-CM

## 2019-06-04 DIAGNOSIS — Z951 Presence of aortocoronary bypass graft: Secondary | ICD-10-CM | POA: Diagnosis not present

## 2019-06-05 NOTE — Progress Notes (Signed)
Cardiac Individual Treatment Plan  Patient Details  Name: Jared Roberts MRN: [id number] Date of Birth: [birth year] Referring Provider:     [facility] from 05/20/2019 in [place]  Referring Provider  Nahser, Wonda Cheng, MD      Initial Encounter Date:    CARDIAC REHAB PHASE II ORIENTATION from 05/20/2019 in [place]  Date  05/20/19      Visit Diagnosis: 04/21/19 NSTEMI  04/21/19 DES mCx  03/12/19 CABG x 3  Patient's Home Medications on Admission:  Current Outpatient Medications:  .  acetaminophen (TYLENOL) 500 MG tablet, Take 1 tablet (500 mg total) by mouth every 4 (four) hours as needed for mild pain., Disp: 30 tablet, Rfl: 0 .  ascorbic acid (VITAMIN C) 1000 MG tablet, Take 1,000 mg by mouth daily., Disp: , Rfl:  .  aspirin EC 81 MG EC tablet, Take 1 tablet (81 mg total) by mouth daily., Disp:  , Rfl:  .  atorvastatin (LIPITOR) 10 MG tablet, Take 10 mg by mouth at bedtime. , Disp: , Rfl:  .  Cholecalciferol (VITAMIN D) 2000 units CAPS, Take 2,000 Units by mouth 2 (two) times a day. , Disp: , Rfl:  .  cyclobenzaprine (FLEXERIL) 10 MG tablet, Take 1 tablet (10 mg total) by mouth 3 (three) times daily as needed for muscle spasms., Disp: 8 tablet, Rfl: 0 .  hydrochlorothiazide (HYDRODIURIL) 25 MG tablet, Take 1 tablet (25 mg total) by mouth daily., Disp: 90 tablet, Rfl: 3 .  isosorbide mononitrate (IMDUR) 30 MG 24 hr tablet, Take 0.5 tablets (15 mg total) by mouth daily., Disp: 45 tablet, Rfl: 3 .  metoprolol tartrate (LOPRESSOR) 50 MG tablet, Take 1.5 tablets (75 mg total) by mouth 2 (two) times daily., Disp: 270 tablet, Rfl: 3 .  Multiple Vitamin (MULTIVITAMIN WITH MINERALS) TABS tablet, Take 1 tablet by mouth daily., Disp: , Rfl:  .  nitroGLYCERIN (NITROSTAT) 0.4 MG SL tablet, Place 1 tablet (0.4 mg total) under the tongue every 5 (five) minutes x 3 doses as needed for chest pain., Disp: 25  tablet, Rfl: 0 .  olmesartan (BENICAR) 20 MG tablet, Take 1 tablet (20 mg total) by mouth 2 (two) times daily., Disp: 60 tablet, Rfl: 11 .  potassium chloride (KLOR-CON) 10 MEQ tablet, Take 1 tablet (10 mEq total) by mouth daily., Disp: 90 tablet, Rfl: 3 .  Probiotic Product (PROBIOTIC DAILY PO), Take 1 capsule by mouth daily. , Disp: , Rfl:  .  propranolol (INDERAL) 10 MG tablet, Take 1 tablet (10 mg total) by mouth 4 (four) times daily as needed (palpitations)., Disp: 30 tablet, Rfl: 5 .  ticagrelor (BRILINTA) 90 MG TABS tablet, Take 1 tablet (90 mg total) by mouth 2 (two) times daily., Disp: 180 tablet, Rfl: 3 .  traMADol (ULTRAM) 50 MG tablet, Take 1 tablet (50 mg total) by mouth every 4 (four) hours as needed for moderate pain., Disp: 28 tablet, Rfl: 0  Past Medical History: Past Medical History:  Diagnosis Date  . Coronary artery disease   . DDD (degenerative disc disease), cervical   . Depression   . ED (erectile dysfunction) of organic origin   . Generalized osteoarthritis   . GERD (gastroesophageal reflux disease)   . Hiatal hernia   . History of chronic bronchitis   . History of gastritis    2003  . History of Helicobacter pylori infection    2003  . History of melanoma  excision    2015--  right ear  . History of prostate cancer urologist-  dr [name]   2007--  pT2a Nx Mx,  Gleason 3+3, S/P  PROSTATECTOMY  . History of squamous cell carcinoma excision    2011  left knee  . Hyperlipidemia   . Hyperplastic colon polyp 2009  . Hypertension   . Male hypogonadism   . Melanoma ([place])    left 4th toe  . Nephrolithiasis   . Prostate cancer ([place])   . Ventricular fibrillation ([place]) 04/07/2019   VF, en route witnessed by EMS, defibrillated x 1    Tobacco Use: Social History   Tobacco Use  Smoking Status Never Smoker  Smokeless Tobacco Never Used    Labs: Recent Review Flowsheet Data    Labs for ITP Cardiac and Pulmonary Rehab Latest Ref Rng & Units 03/12/2019 03/12/2019  03/12/2019 04/07/2019 04/08/2019   Cholestrol 0 - 200 mg/dL - - - - 147   LDLCALC 0 - 99 mg/dL - - - - 67   HDL >40 mg/dL - - - - 43   Trlycerides <150 mg/dL - - - - 187(H)   Hemoglobin A1c 4.8 - 5.6 % - - - - -   PHART 7.350 - 7.450 7.403 7.360 - - -   PCO2ART 32.0 - 48.0 mmHg 30.9(L) 34.7 - - -   HCO3 20.0 - 28.0 mmol/L 19.3(L) 19.6(L) - - -   TCO2 22 - 32 mmol/L 20(L) 21(L) 19(L) 18(L) -   ACIDBASEDEF 0.0 - 2.0 mmol/L 5.0(H) 5.0(H) - - -   O2SAT % 99.0 97.0 - - -      Capillary Blood Glucose: Lab Results  Component Value Date   GLUCAP 111 (H) 03/15/2019   GLUCAP 113 (H) 03/15/2019   GLUCAP 107 (H) 03/14/2019   GLUCAP 149 (H) 03/14/2019   GLUCAP 112 (H) 03/14/2019     Exercise Target Goals: Exercise Program Goal: Individual exercise prescription set using results from initial 6 min walk test and THRR while considering  patient's activity barriers and safety.   Exercise Prescription Goal: Starting with aerobic activity 30 plus minutes a day, 3 days per week for initial exercise prescription. Provide home exercise prescription and guidelines that participant acknowledges understanding prior to discharge.  Activity Barriers & Risk Stratification: Activity Barriers & Cardiac Risk Stratification - 05/20/19 1403      Activity Barriers & Cardiac Risk Stratification   Activity Barriers  Other (comment)    Comments  Occassional double vision-feels disoriented. Upper body soreness from defibrilation.    Cardiac Risk Stratification  High       6 Minute Walk: 6 Minute Walk    Row Name 05/20/19 1415         6 Minute Walk   Phase  Initial     Distance  1393 feet     Walk Time  6 minutes     # of Rest Breaks  0     MPH  2.64     METS  3.03     RPE  11     Perceived Dyspnea   0     VO2 Peak  10.62     Symptoms  No     Resting HR  94 bpm     Resting BP  130/90     Resting Oxygen Saturation   97 %     Exercise Oxygen Saturation  during 6 min walk  97 %     Max Ex. HR  104  bpm     Max Ex. BP  132/92     2 Minute Post BP  130/90        Oxygen Initial Assessment:   Oxygen Re-Evaluation:   Oxygen Discharge (Final Oxygen Re-Evaluation):   Initial Exercise Prescription: Initial Exercise Prescription - 05/20/19 1500      Date of Initial Exercise RX and Referring Provider   Date  05/20/19    Referring Provider  Nahser, Wonda Cheng, MD    Expected Discharge Date  07/16/19      Recumbant Bike   Level  3    Watts  25    Minutes  15    METs  3      Arm/Foot Ergometer   Level  1    Watts  25    Minutes  15    METs  2.7      Prescription Details   Frequency (times per week)  3    Duration  Progress to 30 minutes of continuous aerobic without signs/symptoms of physical distress      Intensity   THRR 40-80% of Max Heartrate  58-116    Ratings of Perceived Exertion  11-13    Perceived Dyspnea  0-4      Progression   Progression  Continue to progress workloads to maintain intensity without signs/symptoms of physical distress.      Resistance Training   Training Prescription  Yes    Weight  3lbs    Reps  10-15       Perform Capillary Blood Glucose checks as needed.  Exercise Prescription Changes: Exercise Prescription Changes    Row Name 05/26/19 1500 06/02/19 1315           Response to Exercise   Blood Pressure (Admit)  140/88  110/82      Blood Pressure (Exercise)  142/92  122/70      Blood Pressure (Exit)  128/92  110/70      Heart Rate (Admit)  83 bpm  90 bpm      Heart Rate (Exercise)  95 bpm  97 bpm      Heart Rate (Exit)  88 bpm  86 bpm      Rating of Perceived Exertion (Exercise)  12  12      Symptoms  None  None      Comments  Pt first day of exercise.   -      Duration  Continue with 30 min of aerobic exercise without signs/symptoms of physical distress.  Continue with 30 min of aerobic exercise without signs/symptoms of physical distress.      Intensity  THRR unchanged  THRR unchanged        Progression   Progression   Continue to progress workloads to maintain intensity without signs/symptoms of physical distress.  Continue to progress workloads to maintain intensity without signs/symptoms of physical distress.      Average METs  3  3.5        Resistance Training   Training Prescription  Yes  Yes      Weight  3lbs  3lbs      Reps  10-15  10-15      Time  10 Minutes  10 Minutes        Interval Training   Interval Training  No  No        Recumbant Bike   Level  3  3      Watts  25  25      Minutes  15  15      METs  3  3.5        Arm/Foot Ergometer   Level  1  2      Watts  25  6      Minutes  15  15      METs  2.68  2.68         Exercise Comments: Exercise Comments    Row Name 05/26/19 1545 06/04/19 1315         Exercise Comments  Pt first day of exercise. Pt tolerated exercise well.  Reviewed METs and goals with Pt. Pt is progressing well.         Exercise Goals and Review: Exercise Goals    Row Name 05/20/19 1405             Exercise Goals   Increase Physical Activity  Yes       Intervention  Provide advice, education, support and counseling about physical activity/exercise needs.;Develop an individualized exercise prescription for aerobic and resistive training based on initial evaluation findings, risk stratification, comorbidities and participant's personal goals.       Expected Outcomes  Short Term: Attend rehab on a regular basis to increase amount of physical activity.;Long Term: Exercising regularly at least 3-5 days a week.;Long Term: Add in home exercise to make exercise part of routine and to increase amount of physical activity.       Increase Strength and Stamina  Yes       Intervention  Provide advice, education, support and counseling about physical activity/exercise needs.;Develop an individualized exercise prescription for aerobic and resistive training based on initial evaluation findings, risk stratification, comorbidities and participant's personal goals.        Expected Outcomes  Short Term: Increase workloads from initial exercise prescription for resistance, speed, and METs.;Short Term: Perform resistance training exercises routinely during rehab and add in resistance training at home;Long Term: Improve cardiorespiratory fitness, muscular endurance and strength as measured by increased METs and functional capacity (6MWT)       Able to understand and use rate of perceived exertion (RPE) scale  Yes       Intervention  Provide education and explanation on how to use RPE scale       Expected Outcomes  Short Term: Able to use RPE daily in rehab to express subjective intensity level;Long Term:  Able to use RPE to guide intensity level when exercising independently       Knowledge and understanding of Target Heart Rate Range (THRR)  Yes       Intervention  Provide education and explanation of THRR including how the numbers were predicted and where they are located for reference       Expected Outcomes  Short Term: Able to state/look up THRR;Long Term: Able to use THRR to govern intensity when exercising independently;Short Term: Able to use daily as guideline for intensity in rehab       Able to check pulse independently  Yes       Intervention  Provide education and demonstration on how to check pulse in carotid and radial arteries.;Review the importance of being able to check your own pulse for safety during independent exercise       Expected Outcomes  Short Term: Able to explain why pulse checking is important during independent exercise;Long Term: Able to check pulse independently and accurately       Understanding of Exercise  Prescription  Yes       Intervention  Provide education, explanation, and written materials on patient's individual exercise prescription       Expected Outcomes  Short Term: Able to explain program exercise prescription;Long Term: Able to explain home exercise prescription to exercise independently          Exercise Goals  Re-Evaluation : Exercise Goals Re-Evaluation    Row Name 05/26/19 1544 06/04/19 1315           Exercise Goal Re-Evaluation   Exercise Goals Review  Increase Physical Activity;Increase Strength and Stamina;Able to understand and use rate of perceived exertion (RPE) scale;Knowledge and understanding of Target Heart Rate Range (THRR);Understanding of Exercise Prescription  Increase Physical Activity;Increase Strength and Stamina;Able to understand and use rate of perceived exertion (RPE) scale;Knowledge and understanding of Target Heart Rate Range (THRR);Able to check pulse independently;Understanding of Exercise Prescription      Comments  Pt first day of exercise in CR program. Pt tolerated exercise Rx well. Pt understands THRR, RPE scale, exercise Rx.  Reviewed METs and goals with Pt. Pt is progressing well and has a MET level of 3.5. Pt is increasing workloads gradually. Pt is exercising at home 2-4 days per week for 30-45 minutes in addition to CR program.      Expected Outcomes  Will continue to monitor and progress Pt as tolerated.  Will continue to monitor and porgress Pt as tolerated.          Discharge Exercise Prescription (Final Exercise Prescription Changes): Exercise Prescription Changes - 06/02/19 1315      Response to Exercise   Blood Pressure (Admit)  110/82    Blood Pressure (Exercise)  122/70    Blood Pressure (Exit)  110/70    Heart Rate (Admit)  90 bpm    Heart Rate (Exercise)  97 bpm    Heart Rate (Exit)  86 bpm    Rating of Perceived Exertion (Exercise)  12    Symptoms  None    Duration  Continue with 30 min of aerobic exercise without signs/symptoms of physical distress.    Intensity  THRR unchanged      Progression   Progression  Continue to progress workloads to maintain intensity without signs/symptoms of physical distress.    Average METs  3.5      Resistance Training   Training Prescription  Yes    Weight  3lbs    Reps  10-15    Time  10 Minutes       Interval Training   Interval Training  No      Recumbant Bike   Level  3    Watts  25    Minutes  15    METs  3.5      Arm/Foot Ergometer   Level  2    Watts  6    Minutes  15    METs  2.68       Nutrition:  Target Goals: Understanding of nutrition guidelines, daily intake of sodium <1547m, cholesterol <2017m calories 30% from fat and 7% or less from saturated fats, daily to have 5 or more servings of fruits and vegetables.  Biometrics: Pre Biometrics - 05/20/19 1359      Pre Biometrics   Height  5' 9.5" (1.765 m)    Weight  77.9 kg    Waist Circumference  37.75 inches    Hip Circumference  40.75 inches    Waist to Hip Ratio  0.93 %  BMI (Calculated)  25.01    Triceps Skinfold  17 mm    % Body Fat  26.1 %    Grip Strength  34.5 kg    Flexibility  12 in    Single Leg Stand  30 seconds        Nutrition Therapy Plan and Nutrition Goals:   Nutrition Assessments:   Nutrition Goals Re-Evaluation:   Nutrition Goals Discharge (Final Nutrition Goals Re-Evaluation):   Psychosocial: Target Goals: Acknowledge presence or absence of significant depression and/or stress, maximize coping skills, provide positive support system. Participant is able to verbalize types and ability to use techniques and skills needed for reducing stress and depression.  Initial Review & Psychosocial Screening: Initial Psych Review & Screening - 05/20/19 1520      Initial Review   Current issues with  None Identified      Family Dynamics   Good Support System?  Yes    Comments  Patient denies psychosocial barriers to participation in CR. He acknolodges having a strong support system including family, friends and his health care team. He has a positive attitude and is beginning to have a positive outlook regarding his health and recovery from his failed coronary graphs and MI x 2 post surgery.      Barriers   Psychosocial barriers to participate in program  There are no identifiable  barriers or psychosocial needs.      Screening Interventions   Interventions  Encouraged to exercise       Quality of Life Scores: Quality of Life - 05/20/19 1529      Quality of Life   Select  Quality of Life      Quality of Life Scores   Health/Function Pre  20.07 %    Socioeconomic Pre  24.58 %    Psych/Spiritual Pre  24.64 %    Family Pre  17.4 %    GLOBAL Pre  21.45 %      Scores of 19 and below usually indicate a poorer quality of life in these areas.  A difference of  2-3 points is a clinically meaningful difference.  A difference of 2-3 points in the total score of the Quality of Life Index has been associated with significant improvement in overall quality of life, self-image, physical symptoms, and general health in studies assessing change in quality of life.  PHQ-9: Recent Review Flowsheet Data    Depression screen [facility] 2/9 05/20/2019   Decreased Interest 0   Down, Depressed, Hopeless 0   PHQ - 2 Score 0     Interpretation of Total Score  Total Score Depression Severity:  1-4 = Minimal depression, 5-9 = Mild depression, 10-14 = Moderate depression, 15-19 = Moderately severe depression, 20-27 = Severe depression   Psychosocial Evaluation and Intervention:   Psychosocial Re-Evaluation: Psychosocial Re-Evaluation    Helen Name 06/03/19 0803             Psychosocial Re-Evaluation   Current issues with  None Identified       Comments  Patient continues to have a positive outlook and attitude. He acknolodges a strong support system including family, friends, and health care team. He denies psychosocial barriers to participation in [place] or self health management at this time. He enjoys hunting, fishing, Marine scientist, and restoring old tools. He utilizes these activities as healthy stress relief.       Expected Outcomes  Patient will continues to have a positive attitude and outlook. He will utilize his strong  support system for physical and emotional support is  psychosocial barriers arise. He will continue to utilize his hobbies for stress relief.       Continue Psychosocial Services   No Follow up required          Psychosocial Discharge (Final Psychosocial Re-Evaluation): Psychosocial Re-Evaluation - 06/03/19 0803      Psychosocial Re-Evaluation   Current issues with  None Identified    Comments  Patient continues to have a positive outlook and attitude. He acknolodges a strong support system including family, friends, and health care team. He denies psychosocial barriers to participation in [place] or self health management at this time. He enjoys hunting, fishing, Marine scientist, and restoring old tools. He utilizes these activities as healthy stress relief.    Expected Outcomes  Patient will continues to have a positive attitude and outlook. He will utilize his strong support system for physical and emotional support is psychosocial barriers arise. He will continue to utilize his hobbies for stress relief.    Continue Psychosocial Services   No Follow up required       Vocational Rehabilitation: Provide vocational rehab assistance to qualifying candidates.   Vocational Rehab Evaluation & Intervention: Vocational Rehab - 05/20/19 1523      Initial Vocational Rehab Evaluation & Intervention   Assessment shows need for Vocational Rehabilitation  No      Vocational Rehab Re-Evaulation   Comments  Patient is retired       Education: Education Goals: Education classes will be provided on a weekly basis, covering required topics. Participant will state understanding/return demonstration of topics presented.  Learning Barriers/Preferences:   Education Topics: Hypertension, Hypertension Reduction -Define heart disease and high blood pressure. Discus how high blood pressure affects the body and ways to reduce high blood pressure.   Exercise and Your Heart -Discuss why it is important to exercise, the FITT principles of exercise, normal and  abnormal responses to exercise, and how to exercise safely.   Angina -Discuss definition of angina, causes of angina, treatment of angina, and how to decrease risk of having angina.   Cardiac Medications -Review what the following cardiac medications are used for, how they affect the body, and side effects that may occur when taking the medications.  Medications include Aspirin, Beta blockers, calcium channel blockers, ACE Inhibitors, angiotensin receptor blockers, diuretics, digoxin, and antihyperlipidemics.   Congestive Heart Failure -Discuss the definition of CHF, how to live with CHF, the signs and symptoms of CHF, and how keep track of weight and sodium intake.   Heart Disease and Intimacy -Discus the effect sexual activity has on the heart, how changes occur during intimacy as we age, and safety during sexual activity.   Smoking Cessation / COPD -Discuss different methods to quit smoking, the health benefits of quitting smoking, and the definition of COPD.   Nutrition I: Fats -Discuss the types of cholesterol, what cholesterol does to the heart, and how cholesterol levels can be controlled.   Nutrition II: Labels -Discuss the different components of food labels and how to read food label   Heart Parts/Heart Disease and PAD -Discuss the anatomy of the heart, the pathway of blood circulation through the heart, and these are affected by heart disease.   Stress I: Signs and Symptoms -Discuss the causes of stress, how stress may lead to anxiety and depression, and ways to limit stress.   Stress II: Relaxation -Discuss different types of relaxation techniques to limit stress.   Warning Signs of Stroke /  TIA -Discuss definition of a stroke, what the signs and symptoms are of a stroke, and how to identify when someone is having stroke.   Knowledge Questionnaire Score: Knowledge Questionnaire Score - 05/20/19 1529      Knowledge Questionnaire Score   Pre Score  21/24        Core Components/Risk Factors/Patient Goals at Admission: Personal Goals and Risk Factors at Admission - 05/20/19 1530      Core Components/Risk Factors/Patient Goals on Admission   Hypertension  Yes    Intervention  Provide education on lifestyle modifcations including regular physical activity/exercise, weight management, moderate sodium restriction and increased consumption of fresh fruit, vegetables, and low fat dairy, alcohol moderation, and smoking cessation.;Monitor prescription use compliance.    Expected Outcomes  Short Term: Continued assessment and intervention until BP is < 140/20m HG in hypertensive participants. < 130/868mHG in hypertensive participants with diabetes, heart failure or chronic kidney disease.;Long Term: Maintenance of blood pressure at goal levels.    Lipids  Yes    Intervention  Provide education and support for participant on nutrition & aerobic/resistive exercise along with prescribed medications to achieve LDL <7080mHDL >16m65m  Expected Outcomes  Short Term: Participant states understanding of desired cholesterol values and is compliant with medications prescribed. Participant is following exercise prescription and nutrition guidelines.;Long Term: Cholesterol controlled with medications as prescribed, with individualized exercise RX and with personalized nutrition plan. Value goals: LDL < 70mg16mL > 40 mg.    Stress  Yes    Intervention  Offer individual and/or small group education and counseling on adjustment to heart disease, stress management and health-related lifestyle change. Teach and support self-help strategies.;Refer participants experiencing significant psychosocial distress to appropriate mental health specialists for further evaluation and treatment. When possible, include family members and significant others in education/counseling sessions.    Expected Outcomes  Short Term: Participant demonstrates changes in health-related behavior,  relaxation and other stress management skills, ability to obtain effective social support, and compliance with psychotropic medications if prescribed.;Long Term: Emotional wellbeing is indicated by absence of clinically significant psychosocial distress or social isolation.       Core Components/Risk Factors/Patient Goals Review:  Goals and Risk Factor Review    Row Name 06/03/19 0806             Core Components/Risk Factors/Patient Goals Review   Personal Goals Review  Lipids;Hypertension;Stress       Review  Patient has multiple CAD risk factors. He continues to be eager to participate in CR. His personal goals are to improve his physical well being post CABG and to be able to continue to engage in outdoor activities. He hopes his participation in CR will allow him to improve his stamina and strength and modify those risk factors.       Expected Outcomes  Patient will continue to participate in CR with minimal absences. He will continue to engage in lifestyle modifications learned in CR including diet, exercise, and medication adherence.          Core Components/Risk Factors/Patient Goals at Discharge (Final Review):  Goals and Risk Factor Review - 06/03/19 0806      Core Components/Risk Factors/Patient Goals Review   Personal Goals Review  Lipids;Hypertension;Stress    Review  Patient has multiple CAD risk factors. He continues to be eager to participate in CR. His personal goals are to improve his physical well being post CABG and to be able to continue to engage in outdoor  activities. He hopes his participation in CR will allow him to improve his stamina and strength and modify those risk factors.    Expected Outcomes  Patient will continue to participate in CR with minimal absences. He will continue to engage in lifestyle modifications learned in CR including diet, exercise, and medication adherence.       ITP Comments: ITP Comments    Row Name 05/20/19 1347 06/03/19 0758          ITP Comments  Medical Director- Dr. Fransico Him, MD  30 day ITP review: Seaborn is doing well in CR. He completed his first week of cardiac rehab with stable VS and no cardiac complaints.         Comments: see ITP comment

## 2019-06-06 ENCOUNTER — Other Ambulatory Visit: Payer: Self-pay

## 2019-06-06 ENCOUNTER — Encounter (HOSPITAL_COMMUNITY)
Admission: RE | Admit: 2019-06-06 | Discharge: 2019-06-06 | Disposition: A | Discharge status: Home / Self Care | Payer: Medicare HMO | Source: Ambulatory Visit | Attending: Cardiovascular Disease | Admitting: Cardiovascular Disease

## 2019-06-06 DIAGNOSIS — Z955 Presence of coronary angioplasty implant and graft: Secondary | ICD-10-CM

## 2019-06-06 DIAGNOSIS — Z951 Presence of aortocoronary bypass graft: Secondary | ICD-10-CM

## 2019-06-06 DIAGNOSIS — I214 Non-ST elevation (NSTEMI) myocardial infarction: Secondary | ICD-10-CM

## 2019-06-06 NOTE — Progress Notes (Signed)
I have reviewed a Home Exercise Prescription with Jared Roberts . Jared Roberts is currently exercising at home.  The patient was advised to walk 5-7 days a week for 30-45 minutes.  Jared Roberts and I discussed how to progress their exercise prescription.  The patient stated that their goals were to continue to walk 2-4 days for 30-45 minutes in addition to CR program.  The patient stated that they understand the exercise prescription.  We reviewed exercise guidelines, target heart rate during exercise, RPE Scale, weather conditions, NTG use, endpoints for exercise, warmup and cool down.  Patient is encouraged to come to me with any questions. I will continue to follow up with the patient to assist them with progression and safety.    [place] Kiyoshi Schaab BS, ACSM CEP 06/06/2019 3:24 PM

## 2019-06-09 ENCOUNTER — Encounter (HOSPITAL_COMMUNITY)
Admission: RE | Admit: 2019-06-09 | Discharge: 2019-06-09 | Disposition: A | Discharge status: Home / Self Care | Payer: Medicare HMO | Source: Ambulatory Visit | Attending: Cardiovascular Disease | Admitting: Cardiovascular Disease

## 2019-06-09 ENCOUNTER — Other Ambulatory Visit: Payer: Self-pay

## 2019-06-09 DIAGNOSIS — Z951 Presence of aortocoronary bypass graft: Secondary | ICD-10-CM | POA: Diagnosis not present

## 2019-06-09 DIAGNOSIS — Z955 Presence of coronary angioplasty implant and graft: Secondary | ICD-10-CM

## 2019-06-09 DIAGNOSIS — I214 Non-ST elevation (NSTEMI) myocardial infarction: Secondary | ICD-10-CM

## 2019-06-11 ENCOUNTER — Encounter (HOSPITAL_COMMUNITY)
Admission: RE | Admit: 2019-06-11 | Discharge: 2019-06-11 | Disposition: A | Discharge status: Home / Self Care | Payer: Medicare HMO | Source: Ambulatory Visit | Attending: Cardiovascular Disease | Admitting: Cardiovascular Disease

## 2019-06-11 ENCOUNTER — Other Ambulatory Visit: Payer: Self-pay

## 2019-06-11 ENCOUNTER — Other Ambulatory Visit: Payer: Medicare HMO | Admitting: *Deleted

## 2019-06-11 DIAGNOSIS — Z951 Presence of aortocoronary bypass graft: Secondary | ICD-10-CM

## 2019-06-11 DIAGNOSIS — Z955 Presence of coronary angioplasty implant and graft: Secondary | ICD-10-CM

## 2019-06-11 DIAGNOSIS — Z79899 Other long term (current) drug therapy: Secondary | ICD-10-CM | POA: Diagnosis not present

## 2019-06-11 DIAGNOSIS — I214 Non-ST elevation (NSTEMI) myocardial infarction: Secondary | ICD-10-CM

## 2019-06-12 LAB — BASIC METABOLIC PANEL
BUN/Creatinine Ratio: 16 (ref 10–24)
BUN: 19 mg/dL (ref 8–27)
CO2: 21 mmol/L (ref 20–29)
Calcium: 9.7 mg/dL (ref 8.6–10.2)
Chloride: 104 mmol/L (ref 96–106)
Creatinine, Ser: 1.19 mg/dL (ref 0.76–1.27)
GFR calc Af Amer: 69 mL/min/{1.73_m2} (ref >59)
GFR calc non Af Amer: 59 mL/min/{1.73_m2} — ABNORMAL LOW (ref >59)
Glucose: 92 mg/dL (ref 65–99)
Potassium: 4.1 mmol/L (ref 3.5–5.2)
Sodium: 140 mmol/L (ref 134–144)

## 2019-06-13 ENCOUNTER — Encounter (HOSPITAL_COMMUNITY)
Admission: RE | Admit: 2019-06-13 | Discharge: 2019-06-13 | Disposition: A | Discharge status: Home / Self Care | Payer: Medicare HMO | Source: Ambulatory Visit | Attending: Cardiovascular Disease | Admitting: Cardiovascular Disease

## 2019-06-13 ENCOUNTER — Other Ambulatory Visit: Payer: Self-pay

## 2019-06-13 DIAGNOSIS — Z955 Presence of coronary angioplasty implant and graft: Secondary | ICD-10-CM

## 2019-06-13 DIAGNOSIS — I214 Non-ST elevation (NSTEMI) myocardial infarction: Secondary | ICD-10-CM

## 2019-06-13 DIAGNOSIS — Z951 Presence of aortocoronary bypass graft: Secondary | ICD-10-CM | POA: Diagnosis not present

## 2019-06-16 ENCOUNTER — Encounter (HOSPITAL_COMMUNITY)
Admission: RE | Admit: 2019-06-16 | Discharge: 2019-06-16 | Disposition: A | Discharge status: Home / Self Care | Payer: Medicare HMO | Source: Ambulatory Visit | Attending: Cardiovascular Disease | Admitting: Cardiovascular Disease

## 2019-06-16 ENCOUNTER — Other Ambulatory Visit: Payer: Self-pay

## 2019-06-16 DIAGNOSIS — Z951 Presence of aortocoronary bypass graft: Secondary | ICD-10-CM | POA: Diagnosis not present

## 2019-06-16 DIAGNOSIS — I214 Non-ST elevation (NSTEMI) myocardial infarction: Secondary | ICD-10-CM

## 2019-06-16 DIAGNOSIS — Z955 Presence of coronary angioplasty implant and graft: Secondary | ICD-10-CM

## 2019-06-18 ENCOUNTER — Encounter (HOSPITAL_COMMUNITY)
Admission: RE | Admit: 2019-06-18 | Discharge: 2019-06-18 | Disposition: A | Discharge status: Home / Self Care | Payer: Medicare HMO | Source: Ambulatory Visit | Attending: Cardiovascular Disease | Admitting: Cardiovascular Disease

## 2019-06-18 ENCOUNTER — Other Ambulatory Visit: Payer: Self-pay

## 2019-06-18 DIAGNOSIS — Z951 Presence of aortocoronary bypass graft: Secondary | ICD-10-CM

## 2019-06-18 DIAGNOSIS — I214 Non-ST elevation (NSTEMI) myocardial infarction: Secondary | ICD-10-CM

## 2019-06-18 DIAGNOSIS — Z955 Presence of coronary angioplasty implant and graft: Secondary | ICD-10-CM

## 2019-06-20 ENCOUNTER — Encounter (HOSPITAL_COMMUNITY): Payer: Medicare HMO

## 2019-06-23 ENCOUNTER — Other Ambulatory Visit: Payer: Self-pay

## 2019-06-23 ENCOUNTER — Encounter (HOSPITAL_COMMUNITY)
Admission: RE | Admit: 2019-06-23 | Discharge: 2019-06-23 | Disposition: A | Discharge status: Home / Self Care | Payer: Medicare HMO | Source: Ambulatory Visit | Attending: Cardiovascular Disease | Admitting: Cardiovascular Disease

## 2019-06-23 DIAGNOSIS — I214 Non-ST elevation (NSTEMI) myocardial infarction: Secondary | ICD-10-CM

## 2019-06-23 DIAGNOSIS — Z951 Presence of aortocoronary bypass graft: Secondary | ICD-10-CM | POA: Diagnosis not present

## 2019-06-23 DIAGNOSIS — Z955 Presence of coronary angioplasty implant and graft: Secondary | ICD-10-CM

## 2019-06-24 ENCOUNTER — Ambulatory Visit: Payer: Medicare HMO | Admitting: Cardiovascular Disease

## 2019-06-25 ENCOUNTER — Encounter (HOSPITAL_COMMUNITY)
Admission: RE | Admit: 2019-06-25 | Discharge: 2019-06-25 | Disposition: A | Discharge status: Home / Self Care | Payer: Medicare HMO | Source: Ambulatory Visit | Attending: Cardiovascular Disease | Admitting: Cardiovascular Disease

## 2019-06-25 ENCOUNTER — Other Ambulatory Visit: Payer: Self-pay

## 2019-06-25 DIAGNOSIS — Z951 Presence of aortocoronary bypass graft: Secondary | ICD-10-CM | POA: Insufficient documentation

## 2019-06-25 DIAGNOSIS — Z955 Presence of coronary angioplasty implant and graft: Secondary | ICD-10-CM

## 2019-06-25 DIAGNOSIS — I214 Non-ST elevation (NSTEMI) myocardial infarction: Secondary | ICD-10-CM

## 2019-06-26 ENCOUNTER — Ambulatory Visit (INDEPENDENT_AMBULATORY_CARE_PROVIDER_SITE_OTHER): Payer: Medicare HMO | Admitting: Cardiovascular Disease

## 2019-06-26 ENCOUNTER — Encounter: Payer: Self-pay | Admitting: Cardiovascular Disease

## 2019-06-26 VITALS — BP 110/70 | HR 80 | Ht 69.5 in | Wt 176.2 lb

## 2019-06-26 DIAGNOSIS — Z951 Presence of aortocoronary bypass graft: Secondary | ICD-10-CM | POA: Diagnosis not present

## 2019-06-26 DIAGNOSIS — E785 Hyperlipidemia, unspecified: Secondary | ICD-10-CM | POA: Diagnosis not present

## 2019-06-26 DIAGNOSIS — I2510 Atherosclerotic heart disease of native coronary artery without angina pectoris: Secondary | ICD-10-CM | POA: Diagnosis not present

## 2019-06-26 MED ORDER — LOSARTAN POTASSIUM 100 MG PO TABS: 100.0000 mg | ORAL_TABLET | Freq: Every day | ORAL | 3 refills | Status: DC

## 2019-06-26 NOTE — Patient Instructions (Addendum)
Medication Instructions:  Your physician has recommended you make the following change in your medication:  STOP Benicar (Olmesartan) RESTART Losartan (Cozaar) 100 mg once daily TAKE Pepcid AC or Gaviscon for your indigestion and let us know if you feel better after a few times of taking one of these  *If you need a refill on your cardiac medications before your next appointment, please call your pharmacy*   Lab Work: Your physician recommends that you return for lab work in: 3 months on the day of or a few days before your office visit with Dr. Acie Fredrickson.  You will need to FAST for this appointment - nothing to eat or drink after midnight the night before except water.    Testing/Procedures: None Ordered   Follow-Up: At [facility], you and your health needs are our priority.  As part of our continuing mission to provide you with exceptional heart care, we have created designated Provider Care Teams.  These Care Teams include your primary Cardiologist (physician) and Advanced Practice Providers (APPs -  Physician Assistants and Nurse Practitioners) who all work together to provide you with the care you need, when you need it.  Your next appointment:   3 month(s)  On March 3  The format for your next appointment:   In Person  Provider:   Mertie Moores, MD

## 2019-06-26 NOTE — Progress Notes (Signed)
Cardiology Office Note:    Date:  06/26/2019   ID:  Jared Roberts Byard, DOB August 24, 1945, MRN [id number]  PCP:  Deland Pretty, MD  Cardiologist:  Christyana Corwin  Electrophysiologist:  None   Referring MD: Deland Pretty, MD   No chief complaint on file.   January 31, 2019    Sohan Ostling Usman is a 73 y.o. male with a hx of  HTN, TIA and carotid stenosis .  Has been having some chest pain  A week ago, mowed the lawn,  Had CP and profound fatigue. Upper left chest ,  Left shoulder and arm,  Left jaw.   Comes and goes ,  Last for seconds - minutes ,   Resolves if he stops to rest.  Associated with dyspnea but also has dyspnea on its own.  Doesn't exercise,  Works in the yard . Can climb 2 flights of stairs - makes him breath hard.   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  Non smoker, Drinks 1 drink per night   March 26, 2019:  Rush Landmark is seen back today for a follow-up office visit.  He recently had a heart catheterization which revealed severe coronary artery disease and he had coronary artery bypass grafting. BP has been variable Lower in the am Higher in the PM Cannot tell when his BP is high or low Chest is sore but no angina  Exercising some - walking around the house .   Has a referral for cardiac rehab  May 08, 2019: Rush Landmark is seen back today for follow-up of his coronary artery disease.  He status post coronary artery bypass grafting.  He had early graft failure and had a PCI of his left circumflex artery. He presented several weeks later with angina  and was found restenosis of his LCx.   He had stenting of his LCX and is now seen for follow up    Still is not feeling well Is really tired.  He is 8 weeks out from his CABG  1 month out from graft closure and PCI 2 weeks out from 2nd PCI .   No angina .   Has chest wall pain   Brought Pressure log with him.  His evening blood pressures were running a little high.  We recently increased his Benicar up to  20 mg twice a day.  Has back pain since his cardiac arrest on Sept. 14.  Was given flexeril which helps but makes him sleepy .  Has developed double vision starting a week ago.   Has Right cranial nerve IV palsy .    Has continued back pain   May 09, 2019: Was seen yesterday late morning Left the office, went to [employer] very poorly  Took a nap.  Woke up,  HR was 120  Took a SL NTG .   Measured BP which was low  Took metoprolol 50 mg ,   Eventually felt better.  Review of labs from yesterday suggest volume depletion Hb is normal ECG is normal sinus rhythm  today  Will give him propranolol to take as needed.   June 26, 2019:  Overall doing well Rare episodes of CP  Woke up with GERD 2 nights ago. Took Tums  GERD symptoms returned.  Took NTG and pains resolved. Quickly  Has a hx of heartburn ,  This resolved after the CABG.  Past Medical History:  Diagnosis Date   Coronary artery disease  DDD (degenerative disc disease), cervical    Depression    ED (erectile dysfunction) of organic origin    Generalized osteoarthritis    GERD (gastroesophageal reflux disease)    Hiatal hernia    History of chronic bronchitis    History of gastritis    [id number]   History of Helicobacter pylori infection    2003   History of melanoma excision    2015--  right ear   History of prostate cancer urologist-  dr [name]   2007--  pT2a Nx Mx,  Gleason 3+3, S/P  PROSTATECTOMY   History of squamous cell carcinoma excision    2011  left knee   Hyperlipidemia    Hyperplastic colon polyp 2009   Hypertension    Male hypogonadism    Melanoma ([place])    left 4th toe   Nephrolithiasis    Prostate cancer ([place])    Ventricular fibrillation ([place]) 04/07/2019   VF, en route witnessed by EMS, defibrillated x 1    Past Surgical History:  Procedure Laterality Date   AMPUTATION TOE Left 09/07/2015   Procedure: PARTIAL AMPUTATION TOE 4TH LEFT;  Surgeon: [name],  MD;  Location: [place];  Service: Podiatry;  Laterality: Left;   APPENDECTOMY  1970's   CORONARY ARTERY BYPASS GRAFT N/A 03/12/2019   Procedure: CORONARY ARTERY BYPASS GRAFTING (CABG) x 3 WITH ENDOSCOPIC HARVESTING OF RIGHT GREATER SAPHENOUS VEIN;  Surgeon: [name], MD;  Location: [place] [facility];  Service: Open Heart Surgery;  Laterality: N/A;   CORONARY BALLOON ANGIOPLASTY N/A 04/07/2019   Procedure: CORONARY BALLOON ANGIOPLASTY;  Surgeon: [name], MD;  Location: [place] [facility];  Service: Cardiovascular;  Laterality: N/A;   CORONARY STENT INTERVENTION N/A 04/21/2019   Procedure: CORONARY STENT INTERVENTION;  Surgeon: [name], MD;  Location: [place] [facility];  Service: Cardiovascular;  Laterality: N/A;   CYSTO/  CLOT EVACUATION POST PROSTATECTOMY  11-19-2005   CYSTO/  REMOVAL URETHRAL STONE AND URETHRAL STAPLES  06-02-2010   CYSTO/  URETEROSCOPIC STONE EXTRACTION  X2  1990's   KIDNEY STONE SURGERY     LEFT HEART CATH AND CORONARY ANGIOGRAPHY N/A 02/25/2019   Procedure: LEFT HEART CATH AND CORONARY ANGIOGRAPHY;  Surgeon: [name], MD;  Location: [facility];  Service: Cardiovascular;  Laterality: N/A;   LEFT HEART CATH AND CORS/GRAFTS ANGIOGRAPHY N/A 04/07/2019   Procedure: LEFT HEART CATH AND CORS/GRAFTS ANGIOGRAPHY;  Surgeon: [name], MD;  Location: [place];  Service: Cardiovascular;  Laterality: N/A;   LEFT HEART CATH AND CORS/GRAFTS ANGIOGRAPHY N/A 04/21/2019   Procedure: LEFT HEART CATH AND CORS/GRAFTS ANGIOGRAPHY;  Surgeon: [name], MD;  Location: [facility];  Service: Cardiovascular;  Laterality: N/A;   MELANOMA EXCISION  2015   right ear   ROBOT ASSISTED LAPAROSCOPIC RADICAL PROSTATECTOMY  11-09-2005   SKIN CANCER EXCISION     TEE WITHOUT CARDIOVERSION N/A 03/12/2019   Procedure: TRANSESOPHAGEAL ECHOCARDIOGRAM (TEE);  Surgeon: [name], MD;  Location: [place];   Service: Open Heart Surgery;  Laterality: N/A;   TONSILLECTOMY  child    Current Medications: Current Meds  Medication Sig   ascorbic acid (VITAMIN C) 1000 MG tablet Take 1,000 mg by mouth daily.   aspirin EC 81 MG EC tablet Take 1 tablet (81 mg total) by mouth daily.   atorvastatin (LIPITOR) 10 MG tablet Take 10 mg by mouth at bedtime.    calcium carbonate (TUMS - DOSED IN  MG ELEMENTAL CALCIUM) 500 MG chewable tablet Chew 1 tablet by mouth as needed for indigestion or heartburn.   Cholecalciferol (VITAMIN D) 2000 units CAPS Take 2,000 Units by mouth 2 (two) times a day.    cyclobenzaprine (FLEXERIL) 10 MG tablet Take 1 tablet (10 mg total) by mouth 3 (three) times daily as needed for muscle spasms.   hydrochlorothiazide (HYDRODIURIL) 25 MG tablet Take 1 tablet (25 mg total) by mouth daily.   ibuprofen (ADVIL) 400 MG tablet Take 400 mg by mouth as needed for moderate pain.   isosorbide mononitrate (IMDUR) 30 MG 24 hr tablet Take 15 mg by mouth 2 (two) times daily.   metoprolol tartrate (LOPRESSOR) 50 MG tablet Take 1.5 tablets (75 mg total) by mouth 2 (two) times daily.   Multiple Vitamin (MULTIVITAMIN WITH MINERALS) TABS tablet Take 1 tablet by mouth daily.   nitroGLYCERIN (NITROSTAT) 0.4 MG SL tablet Place 1 tablet (0.4 mg total) under the tongue every 5 (five) minutes x 3 doses as needed for chest pain.   olmesartan (BENICAR) 20 MG tablet Take 1 tablet (20 mg total) by mouth 2 (two) times daily.   potassium chloride (KLOR-CON) 10 MEQ tablet Take 1 tablet (10 mEq total) by mouth daily.   Probiotic Product (PROBIOTIC DAILY PO) Take 1 capsule by mouth daily.    propranolol (INDERAL) 10 MG tablet Take 1 tablet (10 mg total) by mouth 4 (four) times daily as needed (palpitations).   ticagrelor (BRILINTA) 90 MG TABS tablet Take 1 tablet (90 mg total) by mouth 2 (two) times daily.   traMADol (ULTRAM) 50 MG tablet Take 1 tablet (50 mg total) by mouth every 4 (four) hours as  needed for moderate pain.     Allergies:   Patient has no known allergies.   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 1 son  Lifestyle   Physical activity    Days per week: 0 days    Minutes per session: 0 min   Stress: Not at all  Relationships   Social connections    Talks on phone: More than three times a week    Gets together: Once a week    Attends religious service: 1 to 4 times per year    Active member of club or organization: No    Attends meetings of clubs or organizations: Never    Relationship status: Married  Other Topics Concern   Not on file  Social History Narrative   Not on file     Family History: The patient's family history includes Arthritis in his mother; Congestive Heart Failure in his mother; Diabetes Mellitus II in his mother; Osteoarthritis in  his mother; Other in his father.  ROS:   Please see the history of present illness.     All other systems reviewed and are negative.  EKGs/Labs/Other Studies Reviewed:    The following studies were reviewed today:   EKG:     Recent Labs: 03/05/2019: ALT 20 04/07/2019: Magnesium 2.2 05/08/2019: Hemoglobin 16.3; Platelets 285 06/11/2019: BUN 19; Creatinine, Ser 1.19; Potassium 4.1; Sodium 140  Recent Lipid Panel      Component Value Date/Time   CHOL 147 04/08/2019 0300   TRIG 187 (H) 04/08/2019 0300   HDL 43 04/08/2019 0300   CHOLHDL 3.4 04/08/2019 0300   VLDL 37 04/08/2019 0300   LDLCALC 67 04/08/2019 0300    Physical Exam:     Physical Exam: Blood pressure 110/70, pulse 80, height 5' 9.5" (1.765 m), weight 176 lb 3.2 oz (79.9 kg), SpO2 99 %.  GEN:  Well nourished, well developed in no acute distress HEENT: Normal NECK: No JVD; No carotid bruits LYMPHATICS: No lymphadenopathy CARDIAC: RRR , no murmurs, rubs, gallops RESPIRATORY:  Clear to auscultation without rales, wheezing or rhonchi  ABDOMEN: Soft, non-tender, non-distended MUSCULOSKELETAL:  No edema; No deformity  SKIN: Warm and dry NEUROLOGIC:  Alert and oriented x 3    ASSESSMENT:    No diagnosis found. PLAN:    In order of problems listed above:  1. CAD :   Has had a complicated course over the past month.  He is had bypass surgery recently followed by 2 separate PCI procedures. He is now having GERD symptoms that may be angina Will have him take pepcid complete for several weeks and see if his GERD symptoms resolve.   He may still continue to take NTG as needed - especially at night    2.  HTN: BP has been ok   3.  Hyperlipidemia:   Lipids were great   4.   Back pain  - has been present since his cardiac arrest  May need CT or MRI.   rec ha   Medication Adjustments/Labs and Tests Ordered: Current medicines are reviewed at length with the patient today.  Concerns regarding medicines are outlined above.  No orders of the defined types were placed in this encounter.  No orders of the defined types were placed in this encounter.    There are no Patient Instructions on file for this visit.   Signed, Mertie Moores, MD  06/26/2019 11:13 AM    [place]

## 2019-06-27 ENCOUNTER — Encounter (HOSPITAL_COMMUNITY)
Admission: RE | Admit: 2019-06-27 | Discharge: 2019-06-27 | Disposition: A | Discharge status: Home / Self Care | Payer: Medicare HMO | Source: Ambulatory Visit | Attending: Cardiovascular Disease | Admitting: Cardiovascular Disease

## 2019-06-27 ENCOUNTER — Ambulatory Visit: Payer: Medicare HMO | Admitting: Family

## 2019-06-27 ENCOUNTER — Other Ambulatory Visit: Payer: Self-pay

## 2019-06-27 ENCOUNTER — Encounter (HOSPITAL_COMMUNITY): Payer: Medicare HMO

## 2019-06-27 DIAGNOSIS — Z951 Presence of aortocoronary bypass graft: Secondary | ICD-10-CM

## 2019-06-27 DIAGNOSIS — Z955 Presence of coronary angioplasty implant and graft: Secondary | ICD-10-CM

## 2019-06-27 DIAGNOSIS — I214 Non-ST elevation (NSTEMI) myocardial infarction: Secondary | ICD-10-CM

## 2019-06-30 ENCOUNTER — Other Ambulatory Visit: Payer: Self-pay

## 2019-06-30 ENCOUNTER — Encounter (HOSPITAL_COMMUNITY)
Admission: RE | Admit: 2019-06-30 | Discharge: 2019-06-30 | Disposition: A | Discharge status: Home / Self Care | Payer: Medicare HMO | Source: Ambulatory Visit | Attending: Cardiovascular Disease | Admitting: Cardiovascular Disease

## 2019-06-30 DIAGNOSIS — Z951 Presence of aortocoronary bypass graft: Secondary | ICD-10-CM

## 2019-06-30 DIAGNOSIS — Z955 Presence of coronary angioplasty implant and graft: Secondary | ICD-10-CM

## 2019-06-30 DIAGNOSIS — I214 Non-ST elevation (NSTEMI) myocardial infarction: Secondary | ICD-10-CM

## 2019-07-02 ENCOUNTER — Encounter (HOSPITAL_COMMUNITY)
Admission: RE | Admit: 2019-07-02 | Discharge: 2019-07-02 | Disposition: A | Discharge status: Home / Self Care | Payer: Medicare HMO | Source: Ambulatory Visit | Attending: Cardiovascular Disease | Admitting: Cardiovascular Disease

## 2019-07-02 ENCOUNTER — Telehealth (HOSPITAL_COMMUNITY): Payer: Self-pay

## 2019-07-02 ENCOUNTER — Other Ambulatory Visit: Payer: Self-pay

## 2019-07-02 DIAGNOSIS — Z955 Presence of coronary angioplasty implant and graft: Secondary | ICD-10-CM

## 2019-07-02 DIAGNOSIS — I6523 Occlusion and stenosis of bilateral carotid arteries: Secondary | ICD-10-CM

## 2019-07-02 DIAGNOSIS — Z951 Presence of aortocoronary bypass graft: Secondary | ICD-10-CM

## 2019-07-02 DIAGNOSIS — I25119 Atherosclerotic heart disease of native coronary artery with unspecified angina pectoris: Secondary | ICD-10-CM

## 2019-07-02 DIAGNOSIS — I214 Non-ST elevation (NSTEMI) myocardial infarction: Secondary | ICD-10-CM

## 2019-07-02 NOTE — Telephone Encounter (Signed)

## 2019-07-03 ENCOUNTER — Ambulatory Visit (HOSPITAL_COMMUNITY)
Admission: RE | Admit: 2019-07-03 | Discharge: 2019-07-03 | Disposition: A | Discharge status: Home / Self Care | Payer: Medicare HMO | Source: Ambulatory Visit | Attending: Family | Admitting: Family

## 2019-07-03 DIAGNOSIS — I6523 Occlusion and stenosis of bilateral carotid arteries: Secondary | ICD-10-CM | POA: Insufficient documentation

## 2019-07-03 NOTE — Progress Notes (Signed)
Cardiac Individual Treatment Plan  Patient Details  Name: Jared Roberts MRN: [id number] Date of Birth: 09-Feb-1946 Referring Provider:     [place] from 05/20/2019 in [place]  Referring Provider  Nahser, Wonda Cheng, MD      Initial Encounter Date:    CARDIAC REHAB PHASE II ORIENTATION from 05/20/2019 in [place]  Date  05/20/19      Visit Diagnosis: 04/21/19 NSTEMI  04/21/19 DES mCx  03/12/19 CABG x 3  Patient's Home Medications on Admission:  Current Outpatient Medications:  .  ascorbic acid (VITAMIN C) 1000 MG tablet, Take 1,000 mg by mouth daily., Disp: , Rfl:  .  aspirin EC 81 MG EC tablet, Take 1 tablet (81 mg total) by mouth daily., Disp:  , Rfl:  .  atorvastatin (LIPITOR) 10 MG tablet, Take 10 mg by mouth at bedtime. , Disp: , Rfl:  .  calcium carbonate (TUMS - DOSED IN MG ELEMENTAL CALCIUM) 500 MG chewable tablet, Chew 1 tablet by mouth as needed for indigestion or heartburn., Disp: , Rfl:  .  Cholecalciferol (VITAMIN D) 2000 units CAPS, Take 2,000 Units by mouth 2 (two) times a day. , Disp: , Rfl:  .  cyclobenzaprine (FLEXERIL) 10 MG tablet, Take 1 tablet (10 mg total) by mouth 3 (three) times daily as needed for muscle spasms., Disp: 8 tablet, Rfl: 0 .  hydrochlorothiazide (HYDRODIURIL) 25 MG tablet, Take 1 tablet (25 mg total) by mouth daily., Disp: 90 tablet, Rfl: 3 .  ibuprofen (ADVIL) 400 MG tablet, Take 400 mg by mouth as needed for moderate pain., Disp: , Rfl:  .  isosorbide mononitrate (IMDUR) 30 MG 24 hr tablet, Take 15 mg by mouth 2 (two) times daily., Disp: , Rfl:  .  losartan (COZAAR) 100 MG tablet, Take 1 tablet (100 mg total) by mouth daily., Disp: 90 tablet, Rfl: 3 .  metoprolol tartrate (LOPRESSOR) 50 MG tablet, Take 1.5 tablets (75 mg total) by mouth 2 (two) times daily., Disp: 270 tablet, Rfl: 3 .  Multiple Vitamin (MULTIVITAMIN WITH MINERALS) TABS tablet, Take 1  tablet by mouth daily., Disp: , Rfl:  .  nitroGLYCERIN (NITROSTAT) 0.4 MG SL tablet, Place 1 tablet (0.4 mg total) under the tongue every 5 (five) minutes x 3 doses as needed for chest pain., Disp: 25 tablet, Rfl: 0 .  potassium chloride (KLOR-CON) 10 MEQ tablet, Take 1 tablet (10 mEq total) by mouth daily., Disp: 90 tablet, Rfl: 3 .  Probiotic Product (PROBIOTIC DAILY PO), Take 1 capsule by mouth daily. , Disp: , Rfl:  .  propranolol (INDERAL) 10 MG tablet, Take 1 tablet (10 mg total) by mouth 4 (four) times daily as needed (palpitations)., Disp: 30 tablet, Rfl: 5 .  ticagrelor (BRILINTA) 90 MG TABS tablet, Take 1 tablet (90 mg total) by mouth 2 (two) times daily., Disp: 180 tablet, Rfl: 3 .  traMADol (ULTRAM) 50 MG tablet, Take 1 tablet (50 mg total) by mouth every 4 (four) hours as needed for moderate pain., Disp: 28 tablet, Rfl: 0  Past Medical History: Past Medical History:  Diagnosis Date  . Coronary artery disease   . DDD (degenerative disc disease), cervical   . Depression   . ED (erectile dysfunction) of organic origin   . Generalized osteoarthritis   . GERD (gastroesophageal reflux disease)   . Hiatal hernia   . History of chronic bronchitis   . History of gastritis  2003  . History of Helicobacter pylori infection    2003  . History of melanoma excision    2015--  right ear  . History of prostate cancer urologist-  dr [name]   2007--  pT2a Nx Mx,  Gleason 3+3, S/P  PROSTATECTOMY  . History of squamous cell carcinoma excision    2011  left knee  . Hyperlipidemia   . Hyperplastic colon polyp 2009  . Hypertension   . Male hypogonadism   . Melanoma ([place])    left 4th toe  . Nephrolithiasis   . Prostate cancer ([place])   . Ventricular fibrillation ([place]) 04/07/2019   VF, en route witnessed by EMS, defibrillated x 1    Tobacco Use: Social History   Tobacco Use  Smoking Status Never Smoker  Smokeless Tobacco Never Used    Labs: Recent Review Flowsheet Data    Labs  for ITP Cardiac and Pulmonary Rehab Latest Ref Rng & Units 03/12/2019 03/12/2019 03/12/2019 04/07/2019 04/08/2019   Cholestrol 0 - 200 mg/dL - - - - 147   LDLCALC 0 - 99 mg/dL - - - - 67   HDL >40 mg/dL - - - - 43   Trlycerides <150 mg/dL - - - - 187(H)   Hemoglobin A1c 4.8 - 5.6 % - - - - -   PHART 7.350 - 7.450 7.403 7.360 - - -   PCO2ART 32.0 - 48.0 mmHg 30.9(L) 34.7 - - -   HCO3 20.0 - 28.0 mmol/L 19.3(L) 19.6(L) - - -   TCO2 22 - 32 mmol/L 20(L) 21(L) 19(L) 18(L) -   ACIDBASEDEF 0.0 - 2.0 mmol/L 5.0(H) 5.0(H) - - -   O2SAT % 99.0 97.0 - - -      Capillary Blood Glucose: Lab Results  Component Value Date   GLUCAP 111 (H) 03/15/2019   GLUCAP 113 (H) 03/15/2019   GLUCAP 107 (H) 03/14/2019   GLUCAP 149 (H) 03/14/2019   GLUCAP 112 (H) 03/14/2019     Exercise Target Goals: Exercise Program Goal: Individual exercise prescription set using results from initial 6 min walk test and THRR while considering  patient's activity barriers and safety.   Exercise Prescription Goal: Initial exercise prescription builds to 30-45 minutes a day of aerobic activity, 2-3 days per week.  Home exercise guidelines will be given to patient during program as part of exercise prescription that the participant will acknowledge.  Activity Barriers & Risk Stratification: Activity Barriers & Cardiac Risk Stratification - 05/20/19 1403      Activity Barriers & Cardiac Risk Stratification   Activity Barriers  Other (comment)    Comments  Occassional double vision-feels disoriented. Upper body soreness from defibrilation.    Cardiac Risk Stratification  High       6 Minute Walk: 6 Minute Walk    Row Name 05/20/19 1415         6 Minute Walk   Phase  Initial     Distance  1393 feet     Walk Time  6 minutes     # of Rest Breaks  0     MPH  2.64     METS  3.03     RPE  11     Perceived Dyspnea   0     VO2 Peak  10.62     Symptoms  No     Resting HR  94 bpm     Resting BP  130/90     Resting  Oxygen Saturation  97 %     Exercise Oxygen Saturation  during 6 min walk  97 %     Max Ex. HR  104 bpm     Max Ex. BP  132/92     2 Minute Post BP  130/90        Oxygen Initial Assessment:   Oxygen Re-Evaluation:   Oxygen Discharge (Final Oxygen Re-Evaluation):   Initial Exercise Prescription: Initial Exercise Prescription - 05/20/19 1500      Date of Initial Exercise RX and Referring Provider   Date  05/20/19    Referring Provider  Nahser, Wonda Cheng, MD    Expected Discharge Date  07/16/19      Recumbant Bike   Level  3    Watts  25    Minutes  15    METs  3      Arm/Foot Ergometer   Level  1    Watts  25    Minutes  15    METs  2.7      Prescription Details   Frequency (times per week)  3    Duration  Progress to 30 minutes of continuous aerobic without signs/symptoms of physical distress      Intensity   THRR 40-80% of Max Heartrate  58-116    Ratings of Perceived Exertion  11-13    Perceived Dyspnea  0-4      Progression   Progression  Continue to progress workloads to maintain intensity without signs/symptoms of physical distress.      Resistance Training   Training Prescription  Yes    Weight  3lbs    Reps  10-15       Perform Capillary Blood Glucose checks as needed.  Exercise Prescription Changes: Exercise Prescription Changes    Row Name 05/26/19 1500 06/02/19 1315 06/06/19 1500 06/30/19 1500       Response to Exercise   Blood Pressure (Admit)  140/88  110/82  124/86  118/74    Blood Pressure (Exercise)  142/92  122/70  120/76  118/70    Blood Pressure (Exit)  128/92  110/70  110/70  102/70    Heart Rate (Admit)  83 bpm  90 bpm  90 bpm  86 bpm    Heart Rate (Exercise)  95 bpm  97 bpm  100 bpm  94 bpm    Heart Rate (Exit)  88 bpm  86 bpm  95 bpm  70 bpm    Rating of Perceived Exertion (Exercise)  _0 Symptoms  None  None  None  None    Comments  Pt first day of exercise.   --  --  --    Duration  Continue with 30 min of  aerobic exercise without signs/symptoms of physical distress.  Continue with 30 min of aerobic exercise without signs/symptoms of physical distress.  Continue with 30 min of aerobic exercise without signs/symptoms of physical distress.  Continue with 30 min of aerobic exercise without signs/symptoms of physical distress.    Intensity  THRR unchanged  THRR unchanged  THRR unchanged  THRR unchanged      Progression   Progression  Continue to progress workloads to maintain intensity without signs/symptoms of physical distress.  Continue to progress workloads to maintain intensity without signs/symptoms of physical distress.  Continue to progress workloads to maintain intensity without signs/symptoms of physical distress.  Continue to progress workloads to maintain intensity without signs/symptoms of physical distress.  Average METs  3  3.5  2.8  2.8      Resistance Training   Training Prescription  Yes  Yes  Yes  Yes    Weight  3lbs  3lbs  4 lbs.   4 lbs.     Reps  10-15  10-15  10-15  10-15    Time  10 Minutes  10 Minutes  10 Minutes  10 Minutes      Interval Training   Interval Training  No  No  No  No      Recumbant Bike   Level  _0 3.3    Watts  _1 Minutes  _2 METs  3  3.5  3.9  2.5      NuStep   Level  --  --  --  3    SPM  --  --  --  85    Minutes  --  --  --  15    METs  --  --  --  2.7      Arm/Foot Ergometer   Level  _3 --    Watts  _4 --    Minutes  _5 --    METs  2.68  2.68  1.7  --      Home Exercise Plan   Plans to continue exercise at  --  --  Home (comment)  Home (comment)    Frequency  --  --  Add 3 additional days to program exercise sessions.  Add 3 additional days to program exercise sessions.    Initial Home Exercises Provided  --  --  06/06/19  06/06/19       Exercise Comments: Exercise Comments    Row Name 05/26/19 1545 06/04/19 1315 06/06/19 1529 07/03/19 1400     Exercise Comments  Pt first  day of exercise. Pt tolerated exercise well.  Reviewed METs and goals with Pt. Pt is progressing well.  Reviewed HEP with Pt. Pt understands home exercise goals.  Reviewed METs and goals with Pt. Pt is progressing well.       Exercise Goals and Review: Exercise Goals    Row Name 05/20/19 1405             Exercise Goals   Increase Physical Activity  Yes       Intervention  Provide advice, education, support and counseling about physical activity/exercise needs.;Develop an individualized exercise prescription for aerobic and resistive training based on initial evaluation findings, risk stratification, comorbidities and participant's personal goals.       Expected Outcomes  Short Term: Attend rehab on a regular basis to increase amount of physical activity.;Long Term: Exercising regularly at least 3-5 days a week.;Long Term: Add in home exercise to make exercise part of routine and to increase amount of physical activity.       Increase Strength and Stamina  Yes       Intervention  Provide advice, education, support and counseling about physical activity/exercise needs.;Develop an individualized exercise prescription for aerobic and resistive training based on initial evaluation findings, risk stratification, comorbidities and participant's personal goals.       Expected Outcomes  Short Term: Increase workloads from initial exercise prescription for resistance, speed, and METs.;Short Term: Perform resistance training exercises routinely during  rehab and add in resistance training at home;Long Term: Improve cardiorespiratory fitness, muscular endurance and strength as measured by increased METs and functional capacity (6MWT)       Able to understand and use rate of perceived exertion (RPE) scale  Yes       Intervention  Provide education and explanation on how to use RPE scale       Expected Outcomes  Short Term: Able to use RPE daily in rehab to express subjective intensity level;Long Term:  Able  to use RPE to guide intensity level when exercising independently       Knowledge and understanding of Target Heart Rate Range (THRR)  Yes       Intervention  Provide education and explanation of THRR including how the numbers were predicted and where they are located for reference       Expected Outcomes  Short Term: Able to state/look up THRR;Long Term: Able to use THRR to govern intensity when exercising independently;Short Term: Able to use daily as guideline for intensity in rehab       Able to check pulse independently  Yes       Intervention  Provide education and demonstration on how to check pulse in carotid and radial arteries.;Review the importance of being able to check your own pulse for safety during independent exercise       Expected Outcomes  Short Term: Able to explain why pulse checking is important during independent exercise;Long Term: Able to check pulse independently and accurately       Understanding of Exercise Prescription  Yes       Intervention  Provide education, explanation, and written materials on patient's individual exercise prescription       Expected Outcomes  Short Term: Able to explain program exercise prescription;Long Term: Able to explain home exercise prescription to exercise independently          Exercise Goals Re-Evaluation : Exercise Goals Re-Evaluation    Row Name 05/26/19 1544 06/04/19 1315 06/06/19 1528 07/03/19 1358       Exercise Goal Re-Evaluation   Exercise Goals Review  Increase Physical Activity;Increase Strength and Stamina;Able to understand and use rate of perceived exertion (RPE) scale;Knowledge and understanding of Target Heart Rate Range (THRR);Understanding of Exercise Prescription  Increase Physical Activity;Increase Strength and Stamina;Able to understand and use rate of perceived exertion (RPE) scale;Knowledge and understanding of Target Heart Rate Range (THRR);Able to check pulse independently;Understanding of Exercise Prescription   Increase Physical Activity;Increase Strength and Stamina;Able to understand and use rate of perceived exertion (RPE) scale;Knowledge and understanding of Target Heart Rate Range (THRR);Able to check pulse independently;Understanding of Exercise Prescription  Increase Physical Activity;Increase Strength and Stamina;Able to understand and use rate of perceived exertion (RPE) scale;Knowledge and understanding of Target Heart Rate Range (THRR);Able to check pulse independently;Understanding of Exercise Prescription    Comments  Pt first day of exercise in CR program. Pt tolerated exercise Rx well. Pt understands THRR, RPE scale, exercise Rx.  Reviewed METs and goals with Pt. Pt is progressing well and has a MET level of 3.5. Pt is increasing workloads gradually. Pt is exercising at home 2-4 days per week for 30-45 minutes in addition to CR program.  Reviewed HEP wiht Pt. Pt understands home exercise goals, THRR, RPE scale, weather precautions, end points of exercise, and NTG use. Pt is exercising at home 2-4 days for 30-45 minutes in addition to CR program.  Reviewed METs and goals with Pt. Pt is porgressing well  and has a MET level of 2.8. Pt continues to increase workloads and MET levels. Pt is exercising at home by walking 2-4 days per week for 30-45 minutes in addition to CR program.    Expected Outcomes  Will continue to monitor and progress Pt as tolerated.  Will continue to monitor and porgress Pt as tolerated.  Will continue to monitor and progress Pt as tolerated.  Will continue to monitor and progress Pt as tolerated.       Discharge Exercise Prescription (Final Exercise Prescription Changes): Exercise Prescription Changes - 06/30/19 1500      Response to Exercise   Blood Pressure (Admit)  118/74    Blood Pressure (Exercise)  118/70    Blood Pressure (Exit)  102/70    Heart Rate (Admit)  86 bpm    Heart Rate (Exercise)  94 bpm    Heart Rate (Exit)  70 bpm    Rating of Perceived Exertion  (Exercise)  12    Symptoms  None    Duration  Continue with 30 min of aerobic exercise without signs/symptoms of physical distress.    Intensity  THRR unchanged      Progression   Progression  Continue to progress workloads to maintain intensity without signs/symptoms of physical distress.    Average METs  2.8      Resistance Training   Training Prescription  Yes    Weight  4 lbs.     Reps  10-15    Time  10 Minutes      Interval Training   Interval Training  No      Recumbant Bike   Level  3.3    Watts  9    Minutes  15    METs  2.5      NuStep   Level  3    SPM  85    Minutes  15    METs  2.7      Home Exercise Plan   Plans to continue exercise at  Home (comment)    Frequency  Add 3 additional days to program exercise sessions.    Initial Home Exercises Provided  06/06/19       Nutrition:  Target Goals: Understanding of nutrition guidelines, daily intake of sodium <1569m, cholesterol <202m calories 30% from fat and 7% or less from saturated fats, daily to have 5 or more servings of fruits and vegetables.  Biometrics: Pre Biometrics - 05/20/19 1359      Pre Biometrics   Height  5' 9.5" (1.765 m)    Weight  77.9 kg    Waist Circumference  37.75 inches    Hip Circumference  40.75 inches    Waist to Hip Ratio  0.93 %    BMI (Calculated)  25.01    Triceps Skinfold  17 mm    % Body Fat  26.1 %    Grip Strength  34.5 kg    Flexibility  12 in    Single Leg Stand  30 seconds        Nutrition Therapy Plan and Nutrition Goals:   Nutrition Assessments:   Nutrition Goals Re-Evaluation:   Nutrition Goals Re-Evaluation:   Nutrition Goals Discharge (Final Nutrition Goals Re-Evaluation):   Psychosocial: Target Goals: Acknowledge presence or absence of significant depression and/or stress, maximize coping skills, provide positive support system. Participant is able to verbalize types and ability to use techniques and skills needed for reducing stress and  depression.  Initial Review & Psychosocial  Screening: Initial Psych Review & Screening - 05/20/19 1520      Initial Review   Current issues with  None Identified      Family Dynamics   Good Support System?  Yes    Comments  Patient denies psychosocial barriers to participation in CR. He acknolodges having a strong support system including family, friends and his health care team. He has a positive attitude and is beginning to have a positive outlook regarding his health and recovery from his failed coronary graphs and MI x 2 post surgery.      Barriers   Psychosocial barriers to participate in program  There are no identifiable barriers or psychosocial needs.      Screening Interventions   Interventions  Encouraged to exercise       Quality of Life Scores: Quality of Life - 05/20/19 1529      Quality of Life   Select  Quality of Life      Quality of Life Scores   Health/Function Pre  20.07 %    Socioeconomic Pre  24.58 %    Psych/Spiritual Pre  24.64 %    Family Pre  17.4 %    GLOBAL Pre  21.45 %      Scores of 19 and below usually indicate a poorer quality of life in these areas.  A difference of  2-3 points is a clinically meaningful difference.  A difference of 2-3 points in the total score of the Quality of Life Index has been associated with significant improvement in overall quality of life, self-image, physical symptoms, and general health in studies assessing change in quality of life.  PHQ-9: Recent Review Flowsheet Data    Depression screen [facility] 2/9 05/20/2019   Decreased Interest 0   Down, Depressed, Hopeless 0   PHQ - 2 Score 0     Interpretation of Total Score  Total Score Depression Severity:  1-4 = Minimal depression, 5-9 = Mild depression, 10-14 = Moderate depression, 15-19 = Moderately severe depression, 20-27 = Severe depression   Psychosocial Evaluation and Intervention:   Psychosocial Re-Evaluation: Psychosocial Re-Evaluation    [place] Name  06/03/19 0803 07/03/19 1505           Psychosocial Re-Evaluation   Current issues with  None Identified  None Identified      Comments  Patient continues to have a positive outlook and attitude. He acknolodges a strong support system including family, friends, and health care team. He denies psychosocial barriers to participation in [place] or self health management at this time. He enjoys hunting, fishing, Marine scientist, and restoring old tools. He utilizes these activities as healthy stress relief.  No psychosocial interventions necessary.      Expected Outcomes  Patient will continues to have a positive attitude and outlook. He will utilize his strong support system for physical and emotional support is psychosocial barriers arise. He will continue to utilize his hobbies for stress relief.  Patient will continue to have a positive attitude and outlook. He will utilize his strong support system for physical and emotional support is psychosocial barriers arise. He will continue to utilize his hobbies for stress relief.      Continue Psychosocial Services   No Follow up required  No Follow up required         Psychosocial Discharge (Final Psychosocial Re-Evaluation): Psychosocial Re-Evaluation - 07/03/19 1505      Psychosocial Re-Evaluation   Current issues with  None Identified    Comments  No psychosocial interventions necessary.    Expected Outcomes  Patient will continue to have a positive attitude and outlook. He will utilize his strong support system for physical and emotional support is psychosocial barriers arise. He will continue to utilize his hobbies for stress relief.    Continue Psychosocial Services   No Follow up required       Vocational Rehabilitation: Provide vocational rehab assistance to qualifying candidates.   Vocational Rehab Evaluation & Intervention: Vocational Rehab - 05/20/19 1523      Initial Vocational Rehab Evaluation & Intervention   Assessment shows need for  Vocational Rehabilitation  No      Vocational Rehab Re-Evaulation   Comments  Patient is retired       Education: Education Goals: Education classes will be provided on a weekly basis, covering required topics. Participant will state understanding/return demonstration of topics presented.  Learning Barriers/Preferences:   Education Topics: Count Your Pulse:  -Group instruction provided by verbal instruction, demonstration, patient participation and written materials to support subject.  Instructors address importance of being able to find your pulse and how to count your pulse when at home without a heart monitor.  Patients get hands on experience counting their pulse with staff help and individually.   Heart Attack, Angina, and Risk Factor Modification:  -Group instruction provided by verbal instruction, video, and written materials to support subject.  Instructors address signs and symptoms of angina and heart attacks.    Also discuss risk factors for heart disease and how to make changes to improve heart health risk factors.   Functional Fitness:  -Group instruction provided by verbal instruction, demonstration, patient participation, and written materials to support subject.  Instructors address safety measures for doing things around the house.  Discuss how to get up and down off the floor, how to pick things up properly, how to safely get out of a chair without assistance, and balance training.   Meditation and Mindfulness:  -Group instruction provided by verbal instruction, patient participation, and written materials to support subject.  Instructor addresses importance of mindfulness and meditation practice to help reduce stress and improve awareness.  Instructor also leads participants through a meditation exercise.    Stretching for Flexibility and Mobility:  -Group instruction provided by verbal instruction, patient participation, and written materials to support subject.   Instructors lead participants through series of stretches that are designed to increase flexibility thus improving mobility.  These stretches are additional exercise for major muscle groups that are typically performed during regular warm up and cool down.   Hands Only CPR:  -Group verbal, video, and participation provides a basic overview of AHA guidelines for community CPR. Role-play of emergencies allow participants the opportunity to practice calling for help and chest compression technique with discussion of AED use.   Hypertension: -Group verbal and written instruction that provides a basic overview of hypertension including the most recent diagnostic guidelines, risk factor reduction with self-care instructions and medication management.    Nutrition I class: Heart Healthy Eating:  -Group instruction provided by PowerPoint slides, verbal discussion, and written materials to support subject matter. The instructor gives an explanation and review of the Therapeutic Lifestyle Changes diet recommendations, which includes a discussion on lipid goals, dietary fat, sodium, fiber, plant stanol/sterol esters, sugar, and the components of a well-balanced, healthy diet.   Nutrition II class: Lifestyle Skills:  -Group instruction provided by PowerPoint slides, verbal discussion, and written materials to support subject matter. The instructor gives an explanation  and review of label reading, grocery shopping for heart health, heart healthy recipe modifications, and ways to make healthier choices when eating out.   Diabetes Question & Answer:  -Group instruction provided by PowerPoint slides, verbal discussion, and written materials to support subject matter. The instructor gives an explanation and review of diabetes co-morbidities, pre- and post-prandial blood glucose goals, pre-exercise blood glucose goals, signs, symptoms, and treatment of hypoglycemia and hyperglycemia, and foot care  basics.   Diabetes Blitz:  -Group instruction provided by PowerPoint slides, verbal discussion, and written materials to support subject matter. The instructor gives an explanation and review of the physiology behind type 1 and type 2 diabetes, diabetes medications and rational behind using different medications, pre- and post-prandial blood glucose recommendations and Hemoglobin A1c goals, diabetes diet, and exercise including blood glucose guidelines for exercising safely.    Portion Distortion:  -Group instruction provided by PowerPoint slides, verbal discussion, written materials, and food models to support subject matter. The instructor gives an explanation of serving size versus portion size, changes in portions sizes over the last 20 years, and what consists of a serving from each food group.   Stress Management:  -Group instruction provided by verbal instruction, video, and written materials to support subject matter.  Instructors review role of stress in heart disease and how to cope with stress positively.     Exercising on Your Own:  -Group instruction provided by verbal instruction, power point, and written materials to support subject.  Instructors discuss benefits of exercise, components of exercise, frequency and intensity of exercise, and end points for exercise.  Also discuss use of nitroglycerin and activating EMS.  Review options of places to exercise outside of rehab.  Review guidelines for sex with heart disease.   Cardiac Drugs I:  -Group instruction provided by verbal instruction and written materials to support subject.  Instructor reviews cardiac drug classes: antiplatelets, anticoagulants, beta blockers, and statins.  Instructor discusses reasons, side effects, and lifestyle considerations for each drug class.   Cardiac Drugs II:  -Group instruction provided by verbal instruction and written materials to support subject.  Instructor reviews cardiac drug classes:  angiotensin converting enzyme inhibitors (ACE-I), angiotensin II receptor blockers (ARBs), nitrates, and calcium channel blockers.  Instructor discusses reasons, side effects, and lifestyle considerations for each drug class.   Anatomy and Physiology of the Circulatory System:  Group verbal and written instruction and models provide basic cardiac anatomy and physiology, with the coronary electrical and arterial systems. Review of: AMI, Angina, Valve disease, Heart Failure, Peripheral Artery Disease, Cardiac Arrhythmia, Pacemakers, and the ICD.   Other Education:  -Group or individual verbal, written, or video instructions that support the educational goals of the cardiac rehab program.   Holiday Eating Survival Tips:  -Group instruction provided by PowerPoint slides, verbal discussion, and written materials to support subject matter. The instructor gives patients tips, tricks, and techniques to help them not only survive but enjoy the holidays despite the onslaught of food that accompanies the holidays.   Knowledge Questionnaire Score: Knowledge Questionnaire Score - 05/20/19 1529      Knowledge Questionnaire Score   Pre Score  21/24       Core Components/Risk Factors/Patient Goals at Admission: Personal Goals and Risk Factors at Admission - 05/20/19 1530      Core Components/Risk Factors/Patient Goals on Admission   Hypertension  Yes    Intervention  Provide education on lifestyle modifcations including regular physical activity/exercise, weight management, moderate sodium restriction and increased  consumption of fresh fruit, vegetables, and low fat dairy, alcohol moderation, and smoking cessation.;Monitor prescription use compliance.    Expected Outcomes  Short Term: Continued assessment and intervention until BP is < 140/35m HG in hypertensive participants. < 130/832mHG in hypertensive participants with diabetes, heart failure or chronic kidney disease.;Long Term: Maintenance of  blood pressure at goal levels.    Lipids  Yes    Intervention  Provide education and support for participant on nutrition & aerobic/resistive exercise along with prescribed medications to achieve LDL <7054mHDL >52m24m  Expected Outcomes  Short Term: Participant states understanding of desired cholesterol values and is compliant with medications prescribed. Participant is following exercise prescription and nutrition guidelines.;Long Term: Cholesterol controlled with medications as prescribed, with individualized exercise RX and with personalized nutrition plan. Value goals: LDL < 70mg77mL > 40 mg.    Stress  Yes    Intervention  Offer individual and/or small group education and counseling on adjustment to heart disease, stress management and health-related lifestyle change. Teach and support self-help strategies.;Refer participants experiencing significant psychosocial distress to appropriate mental health specialists for further evaluation and treatment. When possible, include family members and significant others in education/counseling sessions.    Expected Outcomes  Short Term: Participant demonstrates changes in health-related behavior, relaxation and other stress management skills, ability to obtain effective social support, and compliance with psychotropic medications if prescribed.;Long Term: Emotional wellbeing is indicated by absence of clinically significant psychosocial distress or social isolation.       Core Components/Risk Factors/Patient Goals Review:  Goals and Risk Factor Review    Row Name 06/03/19 0806 07/03/19 1511           Core Components/Risk Factors/Patient Goals Review   Personal Goals Review  Lipids;Hypertension;Stress  Lipids;Hypertension;Stress      Review  Patient has multiple CAD risk factors. He continues to be eager to participate in CR. His personal goals are to improve his physical well being post CABG and to be able to continue to engage in outdoor  activities. He hopes his participation in CR will allow him to improve his stamina and strength and modify those risk factors.  Pt with multiple CAD RFs continues to participate in CR. Bill Rush Landmarkinues to tolerate exercise including a change in his exercise prescription to challenge pt more.      Expected Outcomes  Patient will continue to participate in CR with minimal absences. He will continue to engage in lifestyle modifications learned in CR including diet, exercise, and medication adherence.  Patient will continue to participate in CR with minimal absences. He will continue to engage in lifestyle modifications learned in CR including diet, exercise, and medication adherence.         Core Components/Risk Factors/Patient Goals at Discharge (Final Review):  Goals and Risk Factor Review - 07/03/19 1511      Core Components/Risk Factors/Patient Goals Review   Personal Goals Review  Lipids;Hypertension;Stress    Review  Pt with multiple CAD RFs continues to participate in CR. Bill Rush Landmarkinues to tolerate exercise including a change in his exercise prescription to challenge pt more.    Expected Outcomes  Patient will continue to participate in CR with minimal absences. He will continue to engage in lifestyle modifications learned in CR including diet, exercise, and medication adherence.       ITP Comments: ITP Comments    Row Name 05/20/19 1347 [id number] 07/03/19 1326       ITP Comments  Medical Director-  Dr. Fransico Him, MD  30 day ITP review: Mazin is doing well in CR. He completed his first week of cardiac rehab with stable VS and no cardiac complaints.  30 Day ITP Review.  Rush Landmark continues to tolerate exercise including a change in his exercise prescription to challenge pt more.        Comments: See ITP Comments.

## 2019-07-04 ENCOUNTER — Ambulatory Visit: Payer: Medicare HMO | Admitting: Family

## 2019-07-04 ENCOUNTER — Encounter: Payer: Self-pay | Admitting: Family

## 2019-07-04 ENCOUNTER — Other Ambulatory Visit: Payer: Self-pay

## 2019-07-04 ENCOUNTER — Encounter (HOSPITAL_COMMUNITY)
Admission: RE | Admit: 2019-07-04 | Discharge: 2019-07-04 | Disposition: A | Discharge status: Home / Self Care | Payer: Medicare HMO | Source: Ambulatory Visit | Attending: Cardiovascular Disease | Admitting: Cardiovascular Disease

## 2019-07-04 VITALS — BP 132/88 | HR 97 | Temp 97.6°F | Resp 16 | Ht 69.5 in | Wt 176.0 lb

## 2019-07-04 DIAGNOSIS — I214 Non-ST elevation (NSTEMI) myocardial infarction: Secondary | ICD-10-CM

## 2019-07-04 DIAGNOSIS — Z955 Presence of coronary angioplasty implant and graft: Secondary | ICD-10-CM

## 2019-07-04 DIAGNOSIS — I6523 Occlusion and stenosis of bilateral carotid arteries: Secondary | ICD-10-CM | POA: Diagnosis not present

## 2019-07-04 DIAGNOSIS — Z951 Presence of aortocoronary bypass graft: Secondary | ICD-10-CM

## 2019-07-04 NOTE — Progress Notes (Signed)
Chief Complaint: Follow up Extracranial Carotid Artery Stenosis   History of Present Illness  Jared Roberts is a 73 y.o. male who has known carotid stenosis.  Dr. Donzetta Matters last evaluated pt on 05-03-18. At that time pt was asymptomatic.  He was to continue aspirin and follow-up in 1 year with repeat carotid duplex.  Dr. Donzetta Matters discussed signs and symptoms of stroke with pt.  Patient has not had previous carotid artery intervention.  He denies any known history of stroke or TIA. Specifically he denies a history of amaurosis fugax or monocular blindness, unilateral facial drooping, hemiplegia, or receptive or expressive aphasia.    He had a CABG x3 vessels in August 2020; he needed angioplasty x2 and a stent to address 2 MI's post op, per pt.  Prior to the CABG, he was experiencing profound fatigue.   Diabetic: no Tobacco use: non-smoker  Pt meds include: Statin : yes ASA: yes Other anticoagulants/antiplatelets: Brilinta due to CAD, started after the CABG, states he was told that he would take for about a year   Past Medical History:  Diagnosis Date  . Coronary artery disease   . DDD (degenerative disc disease), cervical   . Depression   . ED (erectile dysfunction) of organic origin   . Generalized osteoarthritis   . GERD (gastroesophageal reflux disease)   . Hiatal hernia   . History of chronic bronchitis   . History of gastritis    2003  . History of Helicobacter pylori infection    2003  . History of melanoma excision    2015--  right ear  . History of prostate cancer urologist-  dr [name]   2007--  pT2a Nx Mx,  Gleason 3+3, S/P  PROSTATECTOMY  . History of squamous cell carcinoma excision    2011  left knee  . Hyperlipidemia   . Hyperplastic colon polyp 2009  . Hypertension   . Male hypogonadism   . Melanoma ([place])    left 4th toe  . Nephrolithiasis   . Prostate cancer ([place])   . Ventricular fibrillation ([place]) 04/07/2019   VF, en route witnessed by EMS,  defibrillated x 1    Social History Social History   Tobacco Use  . Smoking status: Never Smoker  . Smokeless tobacco: Never Used  Substance Use Topics  . Alcohol use: Yes    Comment: 1 glass of wine daily  . Drug use: No    Family History Family History  Problem Relation Age of Onset  . Arthritis Mother   . Diabetes Mellitus II Mother   . Congestive Heart Failure Mother   . Osteoarthritis Mother        CHRONIC  . Other Father        KILLED IN WWII    Surgical History Past Surgical History:  Procedure Laterality Date  . AMPUTATION TOE Left 09/07/2015   Procedure: PARTIAL AMPUTATION TOE 4TH LEFT;  Surgeon: [name], MD;  Location: [facility];  Service: Podiatry;  Laterality: Left;  . APPENDECTOMY  1970's  . CORONARY ARTERY BYPASS GRAFT N/A 03/12/2019   Procedure: CORONARY ARTERY BYPASS GRAFTING (CABG) x 3 WITH ENDOSCOPIC HARVESTING OF RIGHT GREATER SAPHENOUS VEIN;  Surgeon: [name], MD;  Location: [facility];  Service: Open Heart Surgery;  Laterality: N/A;  . CORONARY BALLOON ANGIOPLASTY N/A 04/07/2019   Procedure: CORONARY BALLOON ANGIOPLASTY;  Surgeon: [name], MD;  Location: [facility];  Service: Cardiovascular;  Laterality: N/A;  . CORONARY STENT  INTERVENTION N/A 04/21/2019   Procedure: CORONARY STENT INTERVENTION;  Surgeon: [name], MD;  Location: [facility];  Service: Cardiovascular;  Laterality: N/A;  . CYSTO/  CLOT EVACUATION POST PROSTATECTOMY  11-19-2005  . CYSTO/  REMOVAL URETHRAL STONE AND URETHRAL STAPLES  06-02-2010  . CYSTO/  URETEROSCOPIC STONE EXTRACTION  X2  1990's  . KIDNEY STONE SURGERY    . LEFT HEART CATH AND CORONARY ANGIOGRAPHY N/A 02/25/2019   Procedure: LEFT HEART CATH AND CORONARY ANGIOGRAPHY;  Surgeon: [name], MD;  Location: [facility];  Service: Cardiovascular;  Laterality: N/A;  . LEFT HEART CATH AND CORS/GRAFTS ANGIOGRAPHY N/A 04/07/2019   Procedure: LEFT HEART  CATH AND CORS/GRAFTS ANGIOGRAPHY;  Surgeon: [name], MD;  Location: [facility];  Service: Cardiovascular;  Laterality: N/A;  . LEFT HEART CATH AND CORS/GRAFTS ANGIOGRAPHY N/A 04/21/2019   Procedure: LEFT HEART CATH AND CORS/GRAFTS ANGIOGRAPHY;  Surgeon: [name], MD;  Location: [facility];  Service: Cardiovascular;  Laterality: N/A;  . MELANOMA EXCISION  2015   right ear  . ROBOT ASSISTED LAPAROSCOPIC RADICAL PROSTATECTOMY  11-09-2005  . SKIN CANCER EXCISION    . TEE WITHOUT CARDIOVERSION N/A 03/12/2019   Procedure: TRANSESOPHAGEAL ECHOCARDIOGRAM (TEE);  Surgeon: [name], MD;  Location: [facility];  Service: Open Heart Surgery;  Laterality: N/A;  . TONSILLECTOMY  child    No Known Allergies  Current Outpatient Medications  Medication Sig Dispense Refill  . ascorbic acid (VITAMIN C) 1000 MG tablet Take 1,000 mg by mouth daily.    Marland Kitchen aspirin EC 81 MG EC tablet Take 1 tablet (81 mg total) by mouth daily.    Marland Kitchen atorvastatin (LIPITOR) 10 MG tablet Take 10 mg by mouth at bedtime.     . calcium carbonate (TUMS - DOSED IN MG ELEMENTAL CALCIUM) 500 MG chewable tablet Chew 1 tablet by mouth as needed for indigestion or heartburn.    . Cholecalciferol (VITAMIN D) 2000 units CAPS Take 2,000 Units by mouth 2 (two) times a day.     . cyclobenzaprine (FLEXERIL) 10 MG tablet Take 1 tablet (10 mg total) by mouth 3 (three) times daily as needed for muscle spasms. 8 tablet 0  . hydrochlorothiazide (HYDRODIURIL) 25 MG tablet Take 1 tablet (25 mg total) by mouth daily. 90 tablet 3  . ibuprofen (ADVIL) 400 MG tablet Take 400 mg by mouth as needed for moderate pain.    . isosorbide mononitrate (IMDUR) 30 MG 24 hr tablet Take 15 mg by mouth 2 (two) times daily.    Marland Kitchen losartan (COZAAR) 100 MG tablet Take 1 tablet (100 mg total) by mouth daily. 90 tablet 3  . metoprolol tartrate (LOPRESSOR) 50 MG tablet Take 1.5 tablets (75 mg total) by mouth 2 (two) times daily. 270 tablet  3  . Multiple Vitamin (MULTIVITAMIN WITH MINERALS) TABS tablet Take 1 tablet by mouth daily.    . nitroGLYCERIN (NITROSTAT) 0.4 MG SL tablet Place 1 tablet (0.4 mg total) under the tongue every 5 (five) minutes x 3 doses as needed for chest pain. 25 tablet 0  . potassium chloride (KLOR-CON) 10 MEQ tablet Take 1 tablet (10 mEq total) by mouth daily. 90 tablet 3  . Probiotic Product (PROBIOTIC DAILY PO) Take 1 capsule by mouth daily.     . propranolol (INDERAL) 10 MG tablet Take 1 tablet (10 mg total) by mouth 4 (four) times daily as needed (palpitations). 30 tablet 5  . ticagrelor (BRILINTA)  90 MG TABS tablet Take 1 tablet (90 mg total) by mouth 2 (two) times daily. 180 tablet 3  . traMADol (ULTRAM) 50 MG tablet Take 1 tablet (50 mg total) by mouth every 4 (four) hours as needed for moderate pain. 28 tablet 0   No current facility-administered medications for this visit.    Review of Systems : See HPI for pertinent positives and negatives.  Physical Examination  Vitals:   07/04/19 0944 07/04/19 0947  BP: 130/87 132/88  Pulse: 96 97  Resp: 16   Temp: 97.6 F (36.4 C)   TempSrc: Temporal   SpO2: 96%   Weight: 176 lb (79.8 kg)   Height: 5' 9.5" (1.765 m)    Body mass index is 25.62 kg/m.  General: WDWN male in NAD accompanied by his wife GAIT: normal Eyes: Pupils are equal and round HENT: No gross abnormalities.  Pulmonary:  Respirations are non-labored, good air movement in all fields, no rales, rhonchi, or wheezes Cardiac: regular rhythm, no detected murmur.  VASCULAR EXAM Carotid Bruits Right Left   Negative Negative     Abdominal aortic pulse is not palpable. Radial pulses are 2+ palpable and equal.                                                                                                                            LE Pulses Right Left       POPLITEAL  not palpable   1+ palpable       POSTERIOR TIBIAL   palpable    palpable        DORSALIS PEDIS      ANTERIOR  TIBIAL  palpable   palpable     Gastrointestinal: soft, nontender, BS WNL, no r/g, no palpable masses. Musculoskeletal: no muscle atrophy/wasting. M/S 5/5 throughout, extremities without ischemic changes Skin: No rashes, no ulcers, no cellulitis.   Neurologic:  A&O X 3; appropriate affect, sensation is normal; speech is normal, CN 2-12 intact, pain and light touch intact in extremities, motor exam as listed above. Psychiatric: Normal thought content, mood appropriate to clinical situation.    DATA  Carotid Duplex (07-03-19): Right Carotid: Velocities in the right ICA are consistent with a 1-39% stenosis.                Non-hemodynamically significant plaque <50% noted in the CCA. The                ECA appears <50% stenosed. Left Carotid: Velocities in the left ICA are consistent with a 1-39% stenosis.               Non-hemodynamically significant plaque <50% noted in the CCA. The               ECA appears <50% stenosed. Vertebrals:  Bilateral vertebral arteries demonstrate antegrade flow. Subclavians: Normal flow hemodynamics were seen in bilateral subclavian arteries.    Assessment: Jared Roberts is a 73 y.o. male who  presents with no history of stroke or TIA.   Carotid duplex today shows 1-39% bilateral ICA stenosis.   ABI's in August 2020 were normal with all triphasic waveforms.   Fortunately he does not have DM and has never used tobacco. His atherosclerotic risk factors include CAD, history of prostate cancer, and hypertension.  He takes Brilinta, daily 81 mg ASA, and a statin.  He is in cardiac rehab now, had a CABG in August 2020.     Plan: Follow-up in 1 year with Carotid Duplex scan.   I discussed in depth with the patient the nature of atherosclerosis, and emphasized the importance of maximal medical management including strict control of blood pressure, blood glucose, and lipid levels, obtaining regular exercise, and continued cessation of smoking.  The  patient is aware that without maximal medical management the underlying atherosclerotic disease process will progress, limiting the benefit of any interventions. The patient was given information about stroke prevention and what symptoms should prompt the patient to seek immediate medical care. Thank you for allowing [place] to participate in this patient's care.  Clemon Chambers, RN, MSN, FNP-C [facility] Office: [phone]  Clinic Physician: Donzetta Matters  07/04/19 9:50 AM

## 2019-07-04 NOTE — Patient Instructions (Signed)

## 2019-07-07 ENCOUNTER — Other Ambulatory Visit: Payer: Self-pay

## 2019-07-07 ENCOUNTER — Encounter (HOSPITAL_COMMUNITY)
Admission: RE | Admit: 2019-07-07 | Discharge: 2019-07-07 | Disposition: A | Discharge status: Home / Self Care | Payer: Medicare HMO | Source: Ambulatory Visit | Attending: Cardiovascular Disease | Admitting: Cardiovascular Disease

## 2019-07-07 DIAGNOSIS — Z951 Presence of aortocoronary bypass graft: Secondary | ICD-10-CM | POA: Diagnosis not present

## 2019-07-07 DIAGNOSIS — Z955 Presence of coronary angioplasty implant and graft: Secondary | ICD-10-CM

## 2019-07-07 DIAGNOSIS — I214 Non-ST elevation (NSTEMI) myocardial infarction: Secondary | ICD-10-CM

## 2019-07-09 ENCOUNTER — Encounter (HOSPITAL_COMMUNITY)
Admission: RE | Admit: 2019-07-09 | Discharge: 2019-07-09 | Disposition: A | Discharge status: Home / Self Care | Payer: Medicare HMO | Source: Ambulatory Visit | Attending: Cardiovascular Disease | Admitting: Cardiovascular Disease

## 2019-07-09 ENCOUNTER — Other Ambulatory Visit: Payer: Self-pay

## 2019-07-09 DIAGNOSIS — Z951 Presence of aortocoronary bypass graft: Secondary | ICD-10-CM | POA: Diagnosis not present

## 2019-07-09 DIAGNOSIS — Z955 Presence of coronary angioplasty implant and graft: Secondary | ICD-10-CM

## 2019-07-09 DIAGNOSIS — I214 Non-ST elevation (NSTEMI) myocardial infarction: Secondary | ICD-10-CM

## 2019-07-11 ENCOUNTER — Other Ambulatory Visit: Payer: Self-pay

## 2019-07-11 ENCOUNTER — Encounter (HOSPITAL_COMMUNITY)
Admission: RE | Admit: 2019-07-11 | Discharge: 2019-07-11 | Disposition: A | Discharge status: Home / Self Care | Payer: Medicare HMO | Source: Ambulatory Visit | Attending: Cardiovascular Disease | Admitting: Cardiovascular Disease

## 2019-07-11 VITALS — Ht 69.5 in | Wt 176.1 lb

## 2019-07-11 DIAGNOSIS — Z951 Presence of aortocoronary bypass graft: Secondary | ICD-10-CM | POA: Diagnosis not present

## 2019-07-11 DIAGNOSIS — I214 Non-ST elevation (NSTEMI) myocardial infarction: Secondary | ICD-10-CM

## 2019-07-11 DIAGNOSIS — Z955 Presence of coronary angioplasty implant and graft: Secondary | ICD-10-CM

## 2019-07-14 ENCOUNTER — Other Ambulatory Visit: Payer: Self-pay

## 2019-07-14 ENCOUNTER — Encounter (HOSPITAL_COMMUNITY)
Admission: RE | Admit: 2019-07-14 | Discharge: 2019-07-14 | Disposition: A | Discharge status: Home / Self Care | Payer: Medicare HMO | Source: Ambulatory Visit | Attending: Cardiovascular Disease | Admitting: Cardiovascular Disease

## 2019-07-14 DIAGNOSIS — Z951 Presence of aortocoronary bypass graft: Secondary | ICD-10-CM | POA: Diagnosis not present

## 2019-07-14 DIAGNOSIS — Z955 Presence of coronary angioplasty implant and graft: Secondary | ICD-10-CM

## 2019-07-14 DIAGNOSIS — I214 Non-ST elevation (NSTEMI) myocardial infarction: Secondary | ICD-10-CM

## 2019-07-16 ENCOUNTER — Other Ambulatory Visit: Payer: Self-pay

## 2019-07-16 ENCOUNTER — Encounter (HOSPITAL_COMMUNITY)
Admission: RE | Admit: 2019-07-16 | Discharge: 2019-07-16 | Disposition: A | Discharge status: Home / Self Care | Payer: Medicare HMO | Source: Ambulatory Visit | Attending: Cardiovascular Disease | Admitting: Cardiovascular Disease

## 2019-07-16 DIAGNOSIS — Z951 Presence of aortocoronary bypass graft: Secondary | ICD-10-CM | POA: Diagnosis not present

## 2019-07-16 DIAGNOSIS — I214 Non-ST elevation (NSTEMI) myocardial infarction: Secondary | ICD-10-CM

## 2019-07-16 DIAGNOSIS — Z955 Presence of coronary angioplasty implant and graft: Secondary | ICD-10-CM

## 2019-07-21 ENCOUNTER — Telehealth (HOSPITAL_COMMUNITY): Payer: Self-pay | Admitting: Internal Medicine

## 2019-07-23 ENCOUNTER — Other Ambulatory Visit: Payer: Self-pay | Admitting: *Deleted

## 2019-07-23 DIAGNOSIS — I6523 Occlusion and stenosis of bilateral carotid arteries: Secondary | ICD-10-CM

## 2019-07-23 NOTE — Progress Notes (Signed)
Discharge Progress Report  Patient Details  Name: Jared Roberts MRN: [id number] Date of Birth: [birth year] Referring Provider:     Parachute from 05/20/2019 in [place]  Referring Provider  Nahser, Wonda Cheng, MD       Number of Visits: 23  Reason for Discharge:  Patient reached a stable level of exercise. Patient independent in their exercise.  Smoking History:  Social History   Tobacco Use  Smoking Status Never Smoker  Smokeless Tobacco Never Used    Diagnosis:  04/21/19 NSTEMI  04/21/19 DES mCx  03/12/19 CABG x 3  ADL UCSD:   Initial Exercise Prescription: Initial Exercise Prescription - 05/20/19 1500      Date of Initial Exercise RX and Referring Provider   Date  05/20/19    Referring Provider  Nahser, Wonda Cheng, MD    Expected Discharge Date  07/16/19      Recumbant Bike   Level  3    Watts  25    Minutes  15    METs  3      Arm/Foot Ergometer   Level  1    Watts  25    Minutes  15    METs  2.7      Prescription Details   Frequency (times per week)  3    Duration  Progress to 30 minutes of continuous aerobic without signs/symptoms of physical distress      Intensity   THRR 40-80% of Max Heartrate  58-116    Ratings of Perceived Exertion  11-13    Perceived Dyspnea  0-4      Progression   Progression  Continue to progress workloads to maintain intensity without signs/symptoms of physical distress.      Resistance Training   Training Prescription  Yes    Weight  3lbs    Reps  10-15       Discharge Exercise Prescription (Final Exercise Prescription Changes): Exercise Prescription Changes - 07/16/19 1048      Response to Exercise   Blood Pressure (Admit)  94/60    Blood Pressure (Exercise)  120/64    Blood Pressure (Exit)  110/60    Heart Rate (Admit)  86 bpm    Heart Rate (Exercise)  97 bpm    Heart Rate (Exit)  85 bpm    Rating of Perceived Exertion (Exercise)  12    Perceived  Dyspnea (Exercise)  0    Symptoms  None    Comments  Pt graduated from [facility]    Duration  Continue with 30 min of aerobic exercise without signs/symptoms of physical distress.    Intensity  THRR unchanged      Progression   Progression  Continue to progress workloads to maintain intensity without signs/symptoms of physical distress.    Average METs  2.65      Resistance Training   Training Prescription  No      Interval Training   Interval Training  No      Recumbant Bike   Level  3.3    Watts  9    Minutes  15    METs  2.5      NuStep   Level  3    SPM  85    Minutes  15    METs  2.7      Home Exercise Plan   Plans to continue exercise at  Home (comment)   Walking, Biking  Frequency  Add 3 additional days to program exercise sessions.    Initial Home Exercises Provided  06/06/19       Functional Capacity: 6 Minute Walk    Row Name 05/20/19 1415 07/14/19 0840       6 Minute Walk   Phase  Initial  Discharge    Distance  1393 feet  1685 feet    Distance % Change  --  20.96 %    Distance Feet Change  --  292 ft    Walk Time  6 minutes  6 minutes    # of Rest Breaks  0  0    MPH  2.64  3.19    METS  3.03  3.49    RPE  11  11    Perceived Dyspnea   0  0    VO2 Peak  10.62  12.2    Symptoms  No  No    Resting HR  94 bpm  68 bpm    Resting BP  130/90  122/80    Resting Oxygen Saturation   97 %  --    Exercise Oxygen Saturation  during 6 min walk  97 %  --    Max Ex. HR  104 bpm  97 bpm    Max Ex. BP  132/92  140/80    2 Minute Post BP  130/90  100/68       Psychological, QOL, Others - Outcomes: PHQ 2/9: Depression screen PHQ 2/9 05/20/2019  Decreased Interest 0  Down, Depressed, Hopeless 0  PHQ - 2 Score 0    Quality of Life: Quality of Life - 07/17/19 1133      Quality of Life   Select  --   Pt declined completing post QOL      Personal Goals: Goals established at orientation with interventions provided to work toward  goal. Personal Goals and Risk Factors at Admission - 05/20/19 1530      Core Components/Risk Factors/Patient Goals on Admission   Hypertension  Yes    Intervention  Provide education on lifestyle modifcations including regular physical activity/exercise, weight management, moderate sodium restriction and increased consumption of fresh fruit, vegetables, and low fat dairy, alcohol moderation, and smoking cessation.;Monitor prescription use compliance.    Expected Outcomes  Short Term: Continued assessment and intervention until BP is < 140/60m HG in hypertensive participants. < 130/826mHG in hypertensive participants with diabetes, heart failure or chronic kidney disease.;Long Term: Maintenance of blood pressure at goal levels.    Lipids  Yes    Intervention  Provide education and support for participant on nutrition & aerobic/resistive exercise along with prescribed medications to achieve LDL <7020mHDL >36m84m  Expected Outcomes  Short Term: Participant states understanding of desired cholesterol values and is compliant with medications prescribed. Participant is following exercise prescription and nutrition guidelines.;Long Term: Cholesterol controlled with medications as prescribed, with individualized exercise RX and with personalized nutrition plan. Value goals: LDL < 70mg32mL > 40 mg.    Stress  Yes    Intervention  Offer individual and/or small group education and counseling on adjustment to heart disease, stress management and health-related lifestyle change. Teach and support self-help strategies.;Refer participants experiencing significant psychosocial distress to appropriate mental health specialists for further evaluation and treatment. When possible, include family members and significant others in education/counseling sessions.    Expected Outcomes  Short Term: Participant demonstrates changes in health-related behavior, relaxation and other stress management skills, ability  to obtain  effective social support, and compliance with psychotropic medications if prescribed.;Long Term: Emotional wellbeing is indicated by absence of clinically significant psychosocial distress or social isolation.        Personal Goals Discharge: Goals and Risk Factor Review    Row Name 06/03/19 0806 07/03/19 1511 07/23/19 1633         Core Components/Risk Factors/Patient Goals Review   Personal Goals Review  Lipids;Hypertension;Stress  Lipids;Hypertension;Stress  Lipids;Hypertension;Stress     Review  Patient has multiple CAD risk factors. He continues to be eager to participate in CR. His personal goals are to improve his physical well being post CABG and to be able to continue to engage in outdoor activities. He hopes his participation in CR will allow him to improve his stamina and strength and modify those risk factors.  Pt with multiple CAD RFs continues to participate in CR. Rush Landmark continues to tolerate exercise including a change in his exercise prescription to challenge pt more.  Rush Landmark has graduated from [employer] with 23 completed sessions. Bill tolerated exercise well during his time in CR.     Expected Outcomes  Patient will continue to participate in CR with minimal absences. He will continue to engage in lifestyle modifications learned in CR including diet, exercise, and medication adherence.  Patient will continue to participate in CR with minimal absences. He will continue to engage in lifestyle modifications learned in CR including diet, exercise, and medication adherence.  Patient will continue to participate exercise, nutrition, and lifestyle modification opportunities.  He plans to exercise at home with his personal exercise equipment.        Exercise Goals and Review: Exercise Goals    Row Name 05/20/19 1405             Exercise Goals   Increase Physical Activity  Yes       Intervention  Provide advice, education, support and counseling about physical activity/exercise  needs.;Develop an individualized exercise prescription for aerobic and resistive training based on initial evaluation findings, risk stratification, comorbidities and participant's personal goals.       Expected Outcomes  Short Term: Attend rehab on a regular basis to increase amount of physical activity.;Long Term: Exercising regularly at least 3-5 days a week.;Long Term: Add in home exercise to make exercise part of routine and to increase amount of physical activity.       Increase Strength and Stamina  Yes       Intervention  Provide advice, education, support and counseling about physical activity/exercise needs.;Develop an individualized exercise prescription for aerobic and resistive training based on initial evaluation findings, risk stratification, comorbidities and participant's personal goals.       Expected Outcomes  Short Term: Increase workloads from initial exercise prescription for resistance, speed, and METs.;Short Term: Perform resistance training exercises routinely during rehab and add in resistance training at home;Long Term: Improve cardiorespiratory fitness, muscular endurance and strength as measured by increased METs and functional capacity (6MWT)       Able to understand and use rate of perceived exertion (RPE) scale  Yes       Intervention  Provide education and explanation on how to use RPE scale       Expected Outcomes  Short Term: Able to use RPE daily in rehab to express subjective intensity level;Long Term:  Able to use RPE to guide intensity level when exercising independently       Knowledge and understanding of Target Heart Rate Range (THRR)  Yes  Intervention  Provide education and explanation of THRR including how the numbers were predicted and where they are located for reference       Expected Outcomes  Short Term: Able to state/look up THRR;Long Term: Able to use THRR to govern intensity when exercising independently;Short Term: Able to use daily as guideline  for intensity in rehab       Able to check pulse independently  Yes       Intervention  Provide education and demonstration on how to check pulse in carotid and radial arteries.;Review the importance of being able to check your own pulse for safety during independent exercise       Expected Outcomes  Short Term: Able to explain why pulse checking is important during independent exercise;Long Term: Able to check pulse independently and accurately       Understanding of Exercise Prescription  Yes       Intervention  Provide education, explanation, and written materials on patient's individual exercise prescription       Expected Outcomes  Short Term: Able to explain program exercise prescription;Long Term: Able to explain home exercise prescription to exercise independently          Exercise Goals Re-Evaluation: Exercise Goals Re-Evaluation    Row Name 05/26/19 1544 06/04/19 1315 06/06/19 1528 07/03/19 1358 07/17/19 1101     Exercise Goal Re-Evaluation   Exercise Goals Review  Increase Physical Activity;Increase Strength and Stamina;Able to understand and use rate of perceived exertion (RPE) scale;Knowledge and understanding of Target Heart Rate Range (THRR);Understanding of Exercise Prescription  Increase Physical Activity;Increase Strength and Stamina;Able to understand and use rate of perceived exertion (RPE) scale;Knowledge and understanding of Target Heart Rate Range (THRR);Able to check pulse independently;Understanding of Exercise Prescription  Increase Physical Activity;Increase Strength and Stamina;Able to understand and use rate of perceived exertion (RPE) scale;Knowledge and understanding of Target Heart Rate Range (THRR);Able to check pulse independently;Understanding of Exercise Prescription  Increase Physical Activity;Increase Strength and Stamina;Able to understand and use rate of perceived exertion (RPE) scale;Knowledge and understanding of Target Heart Rate Range (THRR);Able to check  pulse independently;Understanding of Exercise Prescription  Increase Physical Activity;Increase Strength and Stamina;Able to understand and use rate of perceived exertion (RPE) scale;Knowledge and understanding of Target Heart Rate Range (THRR);Able to check pulse independently;Understanding of Exercise Prescription   Comments  Pt first day of exercise in CR program. Pt tolerated exercise Rx well. Pt understands THRR, RPE scale, exercise Rx.  Reviewed METs and goals with Pt. Pt is progressing well and has a MET level of 3.5. Pt is increasing workloads gradually. Pt is exercising at home 2-4 days per week for 30-45 minutes in addition to CR program.  Reviewed HEP wiht Pt. Pt understands home exercise goals, THRR, RPE scale, weather precautions, end points of exercise, and NTG use. Pt is exercising at home 2-4 days for 30-45 minutes in addition to CR program.  Reviewed METs and goals with Pt. Pt is porgressing well and has a MET level of 2.8. Pt continues to increase workloads and MET levels. Pt is exercising at home by walking 2-4 days per week for 30-45 minutes in addition to CR program.  Pt completed 23 sessions of Cardiac Rehab. Pt increased cardiovasuclar fitness by 20.96%. Pt increased post 6MWT distance by 281f. Pt's goals was to increase physical activity. Pt was able to increase his activties and states he feels healthier.   Expected Outcomes  Will continue to monitor and progress Pt as tolerated.  Will continue  to monitor and porgress Pt as tolerated.  Will continue to monitor and progress Pt as tolerated.  Will continue to monitor and progress Pt as tolerated.  Pt will plan to exercise 3-4 days a week for 30-45 minutes by walking and/or using home recumbent bike.      Nutrition & Weight - Outcomes: Pre Biometrics - 05/20/19 1359      Pre Biometrics   Height  5' 9.5" (1.765 m)    Weight  77.9 kg    Waist Circumference  37.75 inches    Hip Circumference  40.75 inches    Waist to Hip Ratio  0.93  %    BMI (Calculated)  25.01    Triceps Skinfold  17 mm    % Body Fat  26.1 %    Grip Strength  34.5 kg    Flexibility  12 in    Single Leg Stand  30 seconds      Post Biometrics - 07/14/19 0842       Post  Biometrics   Height  5' 9.5" (1.765 m)    Weight  79.9 kg    Waist Circumference  40 inches    Hip Circumference  41 inches    Waist to Hip Ratio  0.98 %    BMI (Calculated)  25.65    Triceps Skinfold  12 mm    % Body Fat  26 %    Grip Strength  41 kg    Flexibility  0 in   Back Pain   Single Leg Stand  30 seconds       Nutrition:   Nutrition Discharge:   Education Questionnaire Score: Knowledge Questionnaire Score - 07/14/19 1634      Knowledge Questionnaire Score   Pre Score  21/24    Post Score  23/24       Goals reviewed with patient; copy given to patient.

## 2019-07-23 NOTE — Telephone Encounter (Signed)
PC returned to pt regarding his request for medical records.  Pt informed he will have to come to CR to sign a medical release form in order to provide him with medical records.  Pt verbalized understanding and plans to come to CR tomorrow to sign medical release form.

## 2019-08-06 DIAGNOSIS — H52203 Unspecified astigmatism, bilateral: Secondary | ICD-10-CM | POA: Diagnosis not present

## 2019-08-06 DIAGNOSIS — H2513 Age-related nuclear cataract, bilateral: Secondary | ICD-10-CM | POA: Diagnosis not present

## 2019-08-10 ENCOUNTER — Encounter: Payer: Self-pay | Admitting: *Deleted

## 2019-08-10 ENCOUNTER — Ambulatory Visit: Payer: Medicare Other | Attending: Internal Medicine

## 2019-08-10 DIAGNOSIS — Z23 Encounter for immunization: Secondary | ICD-10-CM

## 2019-08-10 NOTE — Progress Notes (Unsigned)
   Covid-19 Vaccination Clinic  Name:  Jared Roberts    MRN: [id number] DOB: September 18, 1945  08/10/2019  [name] was observed post Covid-19 immunization for 15 minutes without incidence. He was provided with Vaccine Information Sheet and instruction to access the V-Safe system.   [name] was instructed to call 911 with any severe reactions post vaccine: Marland Kitchen Difficulty breathing  . Swelling of your face and throat  . A fast heartbeat  . A bad rash all over your body  . Dizziness and weakness

## 2019-08-14 ENCOUNTER — Other Ambulatory Visit: Payer: Self-pay | Admitting: Nurse Practitioner

## 2019-08-14 MED ORDER — METOPROLOL TARTRATE 50 MG PO TABS: 50.0000 mg | ORAL_TABLET | Freq: Three times a day (TID) | ORAL | 3 refills | Status: DC

## 2019-08-29 ENCOUNTER — Ambulatory Visit: Payer: Medicare HMO | Attending: Internal Medicine

## 2019-08-29 DIAGNOSIS — Z23 Encounter for immunization: Secondary | ICD-10-CM

## 2019-08-29 NOTE — Progress Notes (Signed)
   Covid-19 Vaccination Clinic  Name:  Jared Roberts    MRN: [id number] DOB: 28-Jan-1946  08/29/2019  [name] was observed post Covid-19 immunization for 15 minutes without incidence. He was provided with Vaccine Information Sheet and instruction to access the V-Safe system.   [name] was instructed to call 911 with any severe reactions post vaccine: Marland Kitchen Difficulty breathing  . Swelling of your face and throat  . A fast heartbeat  . A bad rash all over your body  . Dizziness and weakness    Immunizations Administered    Name Date Dose VIS Date Route   Pfizer COVID-19 Vaccine 08/29/2019  2:47 PM 0.3 mL 07/04/2019 Intramuscular   Manufacturer: [place]   Lot: [id number]   [place]: [id number]

## 2019-09-18 DIAGNOSIS — L570 Actinic keratosis: Secondary | ICD-10-CM | POA: Diagnosis not present

## 2019-09-18 DIAGNOSIS — D2371 Other benign neoplasm of skin of right lower limb, including hip: Secondary | ICD-10-CM | POA: Diagnosis not present

## 2019-09-18 DIAGNOSIS — L821 Other seborrheic keratosis: Secondary | ICD-10-CM | POA: Diagnosis not present

## 2019-09-18 DIAGNOSIS — D2261 Melanocytic nevi of right upper limb, including shoulder: Secondary | ICD-10-CM | POA: Diagnosis not present

## 2019-09-18 DIAGNOSIS — L578 Other skin changes due to chronic exposure to nonionizing radiation: Secondary | ICD-10-CM | POA: Diagnosis not present

## 2019-09-18 DIAGNOSIS — D2271 Melanocytic nevi of right lower limb, including hip: Secondary | ICD-10-CM | POA: Diagnosis not present

## 2019-09-18 DIAGNOSIS — Z23 Encounter for immunization: Secondary | ICD-10-CM | POA: Diagnosis not present

## 2019-09-18 DIAGNOSIS — Z85828 Personal history of other malignant neoplasm of skin: Secondary | ICD-10-CM | POA: Diagnosis not present

## 2019-09-18 DIAGNOSIS — Z85820 Personal history of malignant melanoma of skin: Secondary | ICD-10-CM | POA: Diagnosis not present

## 2019-09-22 ENCOUNTER — Other Ambulatory Visit: Payer: Medicare HMO | Admitting: *Deleted

## 2019-09-22 ENCOUNTER — Other Ambulatory Visit: Payer: Self-pay

## 2019-09-22 DIAGNOSIS — I2510 Atherosclerotic heart disease of native coronary artery without angina pectoris: Secondary | ICD-10-CM

## 2019-09-22 DIAGNOSIS — Z951 Presence of aortocoronary bypass graft: Secondary | ICD-10-CM

## 2019-09-22 DIAGNOSIS — E785 Hyperlipidemia, unspecified: Secondary | ICD-10-CM

## 2019-09-22 LAB — BASIC METABOLIC PANEL
BUN/Creatinine Ratio: 8 — ABNORMAL LOW (ref 10–24)
BUN: 10 mg/dL (ref 8–27)
CO2: 21 mmol/L (ref 20–29)
Calcium: 10 mg/dL (ref 8.6–10.2)
Chloride: 102 mmol/L (ref 96–106)
Creatinine, Ser: 1.33 mg/dL — ABNORMAL HIGH (ref 0.76–1.27)
GFR calc Af Amer: 60 mL/min/{1.73_m2} (ref >59)
GFR calc non Af Amer: 52 mL/min/{1.73_m2} — ABNORMAL LOW (ref >59)
Glucose: 89 mg/dL (ref 65–99)
Potassium: 4 mmol/L (ref 3.5–5.2)
Sodium: 137 mmol/L (ref 134–144)

## 2019-09-22 LAB — HEPATIC FUNCTION PANEL
ALT: 18 IU/L (ref 0–44)
AST: 22 IU/L (ref 0–40)
Albumin: 4.4 g/dL (ref 3.7–4.7)
Alkaline Phosphatase: 72 IU/L (ref 39–117)
Bilirubin Total: 1 mg/dL (ref 0.0–1.2)
Bilirubin, Direct: 0.24 mg/dL (ref 0.00–0.40)
Total Protein: 6.6 g/dL (ref 6.0–8.5)

## 2019-09-22 LAB — LIPID PANEL
Chol/HDL Ratio: 2.8 ratio (ref 0.0–5.0)
Cholesterol, Total: 157 mg/dL (ref 100–199)
HDL: 56 mg/dL (ref >39)
LDL Chol Calc (NIH): 57 mg/dL (ref 0–99)
Triglycerides: 285 mg/dL — ABNORMAL HIGH (ref 0–149)
VLDL Cholesterol Cal: 44 mg/dL — ABNORMAL HIGH (ref 5–40)

## 2019-09-24 ENCOUNTER — Encounter: Payer: Self-pay | Admitting: Cardiovascular Disease

## 2019-09-24 ENCOUNTER — Ambulatory Visit: Payer: Medicare HMO | Admitting: Cardiovascular Disease

## 2019-09-24 ENCOUNTER — Other Ambulatory Visit: Payer: Self-pay

## 2019-09-24 VITALS — BP 122/80 | HR 76 | Ht 70.0 in | Wt 179.0 lb

## 2019-09-24 DIAGNOSIS — I2510 Atherosclerotic heart disease of native coronary artery without angina pectoris: Secondary | ICD-10-CM

## 2019-09-24 DIAGNOSIS — I10 Essential (primary) hypertension: Secondary | ICD-10-CM

## 2019-09-24 DIAGNOSIS — E785 Hyperlipidemia, unspecified: Secondary | ICD-10-CM

## 2019-09-24 DIAGNOSIS — I214 Non-ST elevation (NSTEMI) myocardial infarction: Secondary | ICD-10-CM | POA: Diagnosis not present

## 2019-09-24 DIAGNOSIS — Z789 Other specified health status: Secondary | ICD-10-CM

## 2019-09-24 DIAGNOSIS — Z951 Presence of aortocoronary bypass graft: Secondary | ICD-10-CM | POA: Diagnosis not present

## 2019-09-24 DIAGNOSIS — I200 Unstable angina: Secondary | ICD-10-CM | POA: Diagnosis not present

## 2019-09-24 NOTE — Patient Instructions (Addendum)
    Medication Instructions:   Your physician recommends that you continue on your current medications as directed. Please refer to the Current Medication list given to you today.  *If you need a refill on your cardiac medications before your next appointment, please call your pharmacy*   You have been referred to [place] [place] TO SEE OUR PHARMACIST FOR MULTIPLE STATIN INTOLERANCES AND CONSIDERATION OF [id number]   Testing/Procedures:  Your physician has requested that you have an echocardiogram. Echocardiography is a painless test that uses sound waves to create images of your heart. It provides your doctor with information about the size and shape of your heart and how well your heart's chambers and valves are working. This procedure takes approximately one hour. There are no restrictions for this procedure.  Follow-Up:  WITH DR. Acie Fredrickson IN 3 MONTHS IN THE OFFICE   Other Instructions BELOW IS SOME ATTACHED INFORMATION DR. Acie Fredrickson HAS PROVIDED ON HEART HEALTHY DIET HE RECOMMENDS  [facility] Low Glycemic Diet (Source: [facility], 2006) Low Glycemic Foods (20-49) (Decrease risk of developing heart disease) Breakfast Cereals: All-Bran All-Bran Fruit 'n Oats Fiber One Oatmeal (not instant) Oat bran Fruits and fruit juices: (Limit to 1-2 servings per day) Apples Apricots (fresh & dried) Blackberries Blueberries Cherries Cranberries Peaches Pears Plums Prunes Grapefruit Raspberries Strawberries Tangerine Apple juice Grapefruit juice Tomato juice Beans and legumes (fresh-cooked): Black-eyed peas Butter beans Chick peas Lentils  Green beans Lima beans Kidney beans Navy beans Pinto beans Snow peas Non-starchy vegetables: Asparagus, avocado, broccoli, cabbage, cauliflower, celery, cucumber, greens, lettuce, mushrooms, peppers, tomatoes, okra, onions, spinach, summer squash Grains: Barley Bulgur Rye Wild  rice Nuts and oils : Almonds Peanuts Sunflower seeds Hazelnuts Pecans Walnuts Oils that are liquid at room temperature Dairy, fish, meat, soy, and eggs: Milk, skim Lowfat cheese Yogurt, lowfat, fruit sugar sweetened Lean red meat Fish  Skinless chicken & [place] Shellfish Egg whites (up to 3 daily) Soy products  Egg yolks (up to 7 or _____ per week) Moderate Glycemic Foods (50-69) Breakfast Cereals: Bran Buds Bran Chex Just Right Mini-Wheats  Special K Swiss muesli Fruits: Banana (under-ripe) Dates Figs Grapes Kiwi Mango Oranges Raisins Fruit Juices: Cranberry juice Orange juice Beans and legumes: [place]-type baked beans Canned pinto, kidney, or navy beans Green peas Vegetables: Beets Carrots  Sweet potato Yam Corn on the cob Breads: Pita (pocket) bread Oat bran bread Pumpernickel bread Rye bread Wheat bread, high fiber  Grains: Cornmeal Rice, brown Rice, white Couscous Pasta: Macaroni Pizza, cheese Ravioli, meat filled Spaghetti, white  Nuts: Cashews Macadamia Snacks: Chocolate Ice cream, lowfat Muffin Popcorn High Glycemic Foods (70-100)  Breakfast Cereals: Cheerios Corn Chex Corn Flakes Cream of Wheat Grape Nuts Grape Nut Flakes Grits Nutri-Grain Puffed Rice Puffed Wheat Rice Chex Rice Krispies Shredded Wheat Team Total Fruits: Pineapple Watermelon Banana (over-ripe) Beverages: Sodas, sweet tea, pineapple juice Vegetables: Potato, baked, boiled, fried, mashed [place] fries Canned or frozen corn Parsnips Winter squash Breads: Most breads (white and whole grain) Bagels Bread sticks Bread stuffing Kaiser roll Dinner rolls Grains: Rice, instant Tapioca, with milk Candy and most cookies Snacks: Donuts Corn chips Jelly beans Pretzels Pastries

## 2019-09-24 NOTE — Progress Notes (Signed)
Cardiology Office Note:    Date:  09/24/2019   ID:  Jared Roberts, DOB [birth year], MRN [id number]  PCP:  Deland Pretty, MD  Cardiologist:  Nyiah Pianka  Electrophysiologist:  None   Referring MD: Deland Pretty, MD   Chief Complaint  Patient presents with  . Coronary Artery Disease    January 31, 2019    Jared Roberts is a 74 y.o. male with a hx of  HTN, TIA and carotid stenosis .  Has been having some chest pain  A week ago, mowed the lawn,  Had CP and profound fatigue. Upper left chest ,  Left shoulder and arm,  Left jaw.   Comes and goes ,  Last for seconds - minutes ,   Resolves if he stops to rest.  Associated with dyspnea but also has dyspnea on its own.  Doesn't exercise,  Works in the yard . Can climb 2 flights of stairs - makes him breath hard.   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  Non smoker, Drinks 1 drink per night   March 26, 2019:  Jared Roberts is seen back today for a follow-up office visit.  He recently had a heart catheterization which revealed severe coronary artery disease and he had coronary artery bypass grafting. BP has been variable Lower in the am Higher in the PM Cannot tell when his BP is high or low Chest is sore but no angina  Exercising some - walking around the house .   Has a referral for cardiac rehab  May 08, 2019: Jared Roberts is seen back today for follow-up of his coronary artery disease.  He status post coronary artery bypass grafting.  He had early graft failure and had a PCI of his left circumflex artery. He presented several weeks later with angina  and was found restenosis of his LCx.   He had stenting of his LCX and is now seen for follow up    Still is not feeling well Is really tired.  He is 8 weeks out from his CABG  1 month out from graft closure and PCI 2 weeks out from 2nd PCI .   No angina .   Has chest wall pain   Brought Pressure log with him.  His evening blood pressures were running a little high.   We recently increased his Benicar up to 20 mg twice a day.  Has back pain since his cardiac arrest on Sept. 14.  Was given flexeril which helps but makes him sleepy .  Has developed double vision starting a week ago.   Has Right cranial nerve IV palsy .    Has continued back pain   May 09, 2019: Was seen yesterday late morning Left the office, went to [employer] very poorly  Took a nap.  Woke up,  HR was 120  Took a SL NTG .   Measured BP which was low  Took metoprolol 50 mg ,   Eventually felt better.  Review of labs from yesterday suggest volume depletion Hb is normal ECG is normal sinus rhythm  today  Will give him propranolol to take as needed.   June 26, 2019:  Overall doing well Rare episodes of CP  Woke up with GERD 2 nights ago. Took Tums  GERD symptoms returned.  Took NTG and pains resolved. Quickly  Has a hx of heartburn ,  This resolved after the CABG.  September 24, 2019: Jared Roberts is seen back  for follow-up visit. Thinks he is losing ground  Is not able to walk slowly No angina Has twinges.   No cp .   Is fatigued.  Does not have the energy to walk much or walk quickly  CABG was Aug. 19.  tnen had MI on Sept 14 and sept 28   Still having back pain which radiates around to his stomach   We discussed his lipid levels.  His triglyceride level had gone from 187 up to 285.  His LDL cholesterol was 57.  It turns out he had had a large fried seafood platter the week before his blood draw and also ate pizza day or so before his blood draw.  It turns out that he is tried rosuvastatin in the past but developed severe aches with that.  We tried him on atorvastatin and he developed severe muscle aches with that.  He is gradually worked his way down to 10 mg which he seems to be tolerating fairly well but it is unclear that that is going to provide any benefit from a cardiovascular risk standpoint.  We will refer him to the lipid clinic for consideration for PCSK9  inhibitors.  Past Medical History:  Diagnosis Date  . Coronary artery disease   . DDD (degenerative disc disease), cervical   . Depression   . ED (erectile dysfunction) of organic origin   . Generalized osteoarthritis   . GERD (gastroesophageal reflux disease)   . Hiatal hernia   . History of chronic bronchitis   . History of gastritis    2003  . History of Helicobacter pylori infection    2003  . History of melanoma excision    2015--  right ear  . History of prostate cancer urologist-  dr [name]   2007--  pT2a Nx Mx,  Gleason 3+3, S/P  PROSTATECTOMY  . History of squamous cell carcinoma excision    2011  left knee  . Hyperlipidemia   . Hyperplastic colon polyp 2009  . Hypertension   . Male hypogonadism   . Melanoma ([place])    left 4th toe  . Nephrolithiasis   . Prostate cancer ([place])   . Ventricular fibrillation ([place]) 04/07/2019   VF, en route witnessed by EMS, defibrillated x 1    Past Surgical History:  Procedure Laterality Date  . AMPUTATION TOE Left 09/07/2015   Procedure: PARTIAL AMPUTATION TOE 4TH LEFT;  Surgeon: [name], MD;  Location: [facility];  Service: Podiatry;  Laterality: Left;  . APPENDECTOMY  1970's  . CORONARY ARTERY BYPASS GRAFT N/A 03/12/2019   Procedure: CORONARY ARTERY BYPASS GRAFTING (CABG) x 3 WITH ENDOSCOPIC HARVESTING OF RIGHT GREATER SAPHENOUS VEIN;  Surgeon: [name], MD;  Location: [facility];  Service: Open Heart Surgery;  Laterality: N/A;  . CORONARY BALLOON ANGIOPLASTY N/A 04/07/2019   Procedure: CORONARY BALLOON ANGIOPLASTY;  Surgeon: [name], MD;  Location: [facility];  Service: Cardiovascular;  Laterality: N/A;  . CORONARY STENT INTERVENTION N/A 04/21/2019   Procedure: CORONARY STENT INTERVENTION;  Surgeon: [name], MD;  Location: [facility];  Service: Cardiovascular;  Laterality: N/A;  . CYSTO/  CLOT EVACUATION POST PROSTATECTOMY  11-19-2005  . CYSTO/  REMOVAL URETHRAL STONE  AND URETHRAL STAPLES  06-02-2010  . CYSTO/  URETEROSCOPIC STONE EXTRACTION  X2  1990's  . KIDNEY STONE SURGERY    . LEFT HEART CATH AND CORONARY ANGIOGRAPHY N/A 02/25/2019   Procedure: LEFT HEART CATH AND CORONARY ANGIOGRAPHY;  Surgeon: [name], MD;  Location: [facility];  Service: Cardiovascular;  Laterality: N/A;  . LEFT HEART CATH AND CORS/GRAFTS ANGIOGRAPHY N/A 04/07/2019   Procedure: LEFT HEART CATH AND CORS/GRAFTS ANGIOGRAPHY;  Surgeon: [name], MD;  Location: [facility];  Service: Cardiovascular;  Laterality: N/A;  . LEFT HEART CATH AND CORS/GRAFTS ANGIOGRAPHY N/A 04/21/2019   Procedure: LEFT HEART CATH AND CORS/GRAFTS ANGIOGRAPHY;  Surgeon: [name], MD;  Location: [facility];  Service: Cardiovascular;  Laterality: N/A;  . MELANOMA EXCISION  2015   right ear  . ROBOT ASSISTED LAPAROSCOPIC RADICAL PROSTATECTOMY  11-09-2005  . SKIN CANCER EXCISION    . TEE WITHOUT CARDIOVERSION N/A 03/12/2019   Procedure: TRANSESOPHAGEAL ECHOCARDIOGRAM (TEE);  Surgeon: [name], MD;  Location: [facility];  Service: Open Heart Surgery;  Laterality: N/A;  . TONSILLECTOMY  child    Current Medications: Current Meds  Medication Sig  . ascorbic acid (VITAMIN C) 1000 MG tablet Take 1,000 mg by mouth daily.  Marland Kitchen aspirin EC 81 MG EC tablet Take 1 tablet (81 mg total) by mouth daily.  Marland Kitchen atorvastatin (LIPITOR) 10 MG tablet Take 10 mg by mouth at bedtime.   . calcium carbonate (TUMS - DOSED IN MG ELEMENTAL CALCIUM) 500 MG chewable tablet Chew 2 tablets by mouth as needed for indigestion or heartburn.   . Cholecalciferol (VITAMIN D) 2000 units CAPS Take 2,000 Units by mouth 2 (two) times a day.   . cyclobenzaprine (FLEXERIL) 10 MG tablet Take 1 tablet (10 mg total) by mouth 3 (three) times daily as needed for muscle spasms.  Marland Kitchen FAMOTIDINE PO Take 10 mg by mouth as needed.  . hydrochlorothiazide (HYDRODIURIL) 25 MG tablet Take 1 tablet (25 mg total) by  mouth daily.  Marland Kitchen ibuprofen (ADVIL) 400 MG tablet Take 400 mg by mouth as needed for moderate pain.  . isosorbide mononitrate (IMDUR) 30 MG 24 hr tablet Take 30 mg by mouth daily. Half a tablet (15mg )  . losartan (COZAAR) 100 MG tablet Take 1 tablet (100 mg total) by mouth daily.  Marland Kitchen MAGNESIUM CITRATE PO Take 250 mg by mouth daily.  . metoprolol tartrate (LOPRESSOR) 50 MG tablet Take 1 tablet (50 mg total) by mouth 3 (three) times daily.  . Multiple Vitamin (MULTIVITAMIN WITH MINERALS) TABS tablet Take 1 tablet by mouth daily.  . nitroGLYCERIN (NITROSTAT) 0.4 MG SL tablet Place 1 tablet (0.4 mg total) under the tongue every 5 (five) minutes x 3 doses as needed for chest pain.  . potassium chloride (KLOR-CON) 10 MEQ tablet Take 1 tablet (10 mEq total) by mouth daily.  . Probiotic Product (PROBIOTIC DAILY PO) Take 1 capsule by mouth daily.   . propranolol (INDERAL) 10 MG tablet Take 1 tablet (10 mg total) by mouth 4 (four) times daily as needed (palpitations).  . ticagrelor (BRILINTA) 90 MG TABS tablet Take 1 tablet (90 mg total) by mouth 2 (two) times daily.  . traMADol (ULTRAM) 50 MG tablet Take 1 tablet (50 mg total) by mouth every 4 (four) hours as needed for moderate pain.     Allergies:   Patient has no known allergies.   Social History   Socioeconomic History  . Marital status: Married    Spouse name: [name]  . Number of children: 1  . Years of education: 44  . Highest education level: Master's degree (e.g., MA, MS, MEng, MEd, MSW, MBA)  Occupational History  . Occupation: retired    Comment:  from chemical sales  Tobacco Use  . Smoking status: Never Smoker  . Smokeless tobacco: Never Used  Substance and Sexual Activity  . Alcohol use: Yes    Comment: 1 glass of wine daily  . Drug use: No  . Sexual activity: Not on file    Comment: retired, married. 1 son  Other Topics Concern  . Not on file  Social History Narrative  . Not on file   Social Determinants of Health    Financial Resource Strain: Low Risk   . Difficulty of Paying Living Expenses: Not hard at all  Food Insecurity: No Food Insecurity  . Worried About Charity fundraiser in the Last Year: Never true  . Ran Out of Food in the Last Year: Never true  Transportation Needs: No Transportation Needs  . Lack of Transportation (Medical): No  . Lack of Transportation (Non-Medical): No  Physical Activity: Inactive  . Days of Exercise per Week: 0 days  . Minutes of Exercise per Session: 0 min  Stress: No Stress Concern Present  . Feeling of Stress : Not at all  Social Connections: Slightly Isolated  . Frequency of Communication with Friends and Family: More than three times a week  . Frequency of Social Gatherings with Friends and Family: Once a week  . Attends Religious Services: 1 to 4 times per year  . Active Member of Clubs or Organizations: No  . Attends Archivist Meetings: Never  . Marital Status: Married     Family History: The patient's family history includes Arthritis in his mother; Congestive Heart Failure in his mother; Diabetes Mellitus II in his mother; Osteoarthritis in his mother; Other in his father.  ROS:   Please see the history of present illness.     All other systems reviewed and are negative.  EKGs/Labs/Other Studies Reviewed:    The following studies were reviewed today:   EKG:     Recent Labs: 04/07/2019: Magnesium 2.2 05/08/2019: Hemoglobin 16.3; Platelets 285 09/22/2019: ALT 18; BUN 10; Creatinine, Ser 1.33; Potassium 4.0; Sodium 137  Recent Lipid Panel    Component Value Date/Time   CHOL 157 09/22/2019 0949   TRIG 285 (H) 09/22/2019 0949   HDL 56 09/22/2019 0949   CHOLHDL 2.8 09/22/2019 0949   CHOLHDL 3.4 04/08/2019 0300   VLDL 37 04/08/2019 0300   LDLCALC 57 09/22/2019 0949    Physical Exam:     Physical Exam: Blood pressure 122/80, pulse 76, height 5\' 10"  (1.778 m), weight 179 lb (81.2 kg), SpO2 96 %.  GEN:  Well nourished, well  developed in no acute distress HEENT: Normal NECK: No JVD; No carotid bruits LYMPHATICS: No lymphadenopathy CARDIAC: RRR , no murmurs, rubs, gallops RESPIRATORY:  Clear to auscultation without rales, wheezing or rhonchi  ABDOMEN: Soft, non-tender, non-distended MUSCULOSKELETAL:  No edema; No deformity  SKIN: Warm and dry NEUROLOGIC:  Alert and oriented x 3    ASSESSMENT:    1. Statin intolerance   2. S/P CABG x 3   3. Coronary artery disease involving native coronary artery of native heart without angina pectoris   4. Hyperlipidemia, unspecified hyperlipidemia type    PLAN:    In order of problems listed above:  CAD :   He still recovering from his bypass grafting in 2 stent procedures.  I think that he will feel better after he gets a little bit more time between him and his surgery  We will check an echocardiogram to make sure that  his LV function still good.   2.  HTN: Blood pressure seems to be fairly well controlled.  3.  Hyperlipidemia:   His last triglyceride level was very elevated.  He admits to eating some fried foods just before his blood draw.  I encouraged him to stay away from fried foods.  4.   Back pain  -he has back pain that seems to be related to the CPR that he received during one of his heart attacks.  It slowly is improving.  I encouraged him to see his medical doctor and consider getting a CT scan if he does not improve substantially over the next month or so.  Medication Adjustments/Labs and Tests Ordered: Current medicines are reviewed at length with the patient today.  Concerns regarding medicines are outlined above.  Orders Placed This Encounter  Procedures  . AMB Referral to [facility]  . ECHOCARDIOGRAM COMPLETE   No orders of the defined types were placed in this encounter.    Patient Instructions         Medication Instructions:   Your physician recommends that you continue on your current medications as directed.  Please refer to the Current Medication list given to you today.  *If you need a refill on your cardiac medications before your next appointment, please call your pharmacy*   You have been referred to [facility] TO SEE OUR PHARMACIST FOR MULTIPLE STATIN INTOLERANCES AND CONSIDERATION OF PCSK9-INHIBITORS   Testing/Procedures:  Your physician has requested that you have an echocardiogram. Echocardiography is a painless test that uses sound waves to create images of your heart. It provides your doctor with information about the size and shape of your heart and how well your heart's chambers and valves are working. This procedure takes approximately one hour. There are no restrictions for this procedure.  Follow-Up:  WITH DR. Acie Fredrickson IN 3 MONTHS IN THE OFFICE   Other Instructions BELOW IS SOME ATTACHED INFORMATION DR. Acie Fredrickson HAS PROVIDED ON HEART HEALTHY DIET HE RECOMMENDS  [facility] Low Glycemic Diet (Source: [facility], 2006) Low Glycemic Foods (20-49) (Decrease risk of developing heart disease) Breakfast Cereals: All-Bran All-Bran Fruit 'n Oats Fiber One Oatmeal (not instant) Oat bran Fruits and fruit juices: (Limit to 1-2 servings per day) Apples Apricots (fresh & dried) Blackberries Blueberries Cherries Cranberries Peaches Pears Plums Prunes Grapefruit Raspberries Strawberries Tangerine Apple juice Grapefruit juice Tomato juice Beans and legumes (fresh-cooked): Black-eyed peas Butter beans Chick peas Lentils  Green beans Lima beans Kidney beans Navy beans Pinto beans Snow peas Non-starchy vegetables: Asparagus, avocado, broccoli, cabbage, cauliflower, celery, cucumber, greens, lettuce, mushrooms, peppers, tomatoes, okra, onions, spinach, summer squash Grains: Barley Bulgur Rye Wild rice Nuts and oils : Almonds Peanuts Sunflower seeds Hazelnuts Pecans Walnuts Oils that are liquid at room temperature Dairy,  fish, meat, soy, and eggs: Milk, skim Lowfat cheese Yogurt, lowfat, fruit sugar sweetened Lean red meat Fish  Skinless chicken & [place] Shellfish Egg whites (up to 3 daily) Soy products  Egg yolks (up to 7 or _____ per week) Moderate Glycemic Foods (50-69) Breakfast Cereals: Bran Buds Bran Chex Just Right Mini-Wheats  Special K Swiss muesli Fruits: Banana (under-ripe) Dates Figs Grapes Kiwi Mango Oranges Raisins Fruit Juices: Cranberry juice Orange juice Beans and legumes: Boston-type baked beans Canned pinto, kidney, or navy beans Green peas Vegetables: Beets Carrots  Sweet potato Yam Corn on the cob Breads: Pita (pocket) bread Oat bran bread Pumpernickel  bread Rye bread Wheat bread, high fiber  Grains: Cornmeal Rice, brown Rice, white Couscous Pasta: Macaroni Pizza, cheese Ravioli, meat filled Spaghetti, white  Nuts: Cashews Macadamia Snacks: Chocolate Ice cream, lowfat Muffin Popcorn High Glycemic Foods (70-100)  Breakfast Cereals: Cheerios Corn Chex Corn Flakes Cream of Wheat Grape Nuts Grape Nut Flakes Grits Nutri-Grain Puffed Rice Puffed Wheat Rice Chex Rice Krispies Shredded Wheat Team Total Fruits: Pineapple Watermelon Banana (over-ripe) Beverages: Sodas, sweet tea, pineapple juice Vegetables: Potato, baked, boiled, fried, mashed [place] fries Canned or frozen corn Parsnips Winter squash Breads: Most breads (white and whole grain) Bagels Bread sticks Bread stuffing Kaiser roll Dinner rolls Grains: Rice, instant Tapioca, with milk Candy and most cookies Snacks: Donuts Corn chips Jelly beans Pretzels Pastries                Signed, Mertie Moores, MD  09/24/2019 5:44 PM    [facility]

## 2019-10-14 NOTE — Progress Notes (Deleted)
Patient ID: Jared Roberts                 DOB: 05-16-1946                    MRN: [id number]     HPI: Jared Roberts is a 74 y.o. male patient referred to lipid clinic by Dr. Acie Fredrickson. PMH is significant for CAD s/p CABG on 03/11/20 with subsequent Mis on 04/07/19 and 04/21/19, HTN, TIA, and carotid stenosis. He was last seen by Dr. Acie Fredrickson on 09/24/19 where he reported still having back pain that radiated around to his stomach. His lipid panel was still elevated and above goal, so he was referred to lipids clinic.  Insurance: [employer] social research officer - Praluent: tier 3, $47/mo initial > 118.86 > 23.77 (need PA) - Vascepa: tier 4, $100/mo initial coverage for Brand > 90.94 donut, 18.19 catastrophic - no PA - Zetia: tier 4, $63>15>3.7 - Nexlizet/nexletol not covered ~$400  https://www.aetnamedicare.com/documents/individual/2021/formularies/FORM_2021_[email or web address].pdf  Current Medications: atorvastatin 10mg  daily Intolerances: Atorvastatin (severe muscle aches), rosuvastatin 5mg  daily Risk Factors: progressive ASCVD (s/p CABG with 2 subsequent MI's) LDL goal: < 55 mg/dL or lower  Diet:     Exercise:   Family History: The patient's family history includes Arthritis in his mother; Congestive Heart Failure in his mother; Diabetes Mellitus II in his mother; Osteoarthritis in his mother; Other in his father.  Social History: Never smoker, 1 glass of wine daily  Labs: 09/22/19: TC 157, TG 285, HDL 56, LDL 57 (atorvastatin 10mg  daily) 04/08/19: TC 147, TG 187, HDL 43, LDL 67 (atorvastatin 10mg  daily)   Past Medical History:  Diagnosis Date  . Coronary artery disease   . DDD (degenerative disc disease), cervical   . Depression   . ED (erectile dysfunction) of organic origin   . Generalized osteoarthritis   . GERD (gastroesophageal reflux disease)   . Hiatal hernia   . History of chronic bronchitis   . History of gastritis    2003  . History of Helicobacter pylori infection    2003  . History of  melanoma excision    2015--  right ear  . History of prostate cancer urologist-  dr [name]   2007--  pT2a Nx Mx,  Gleason 3+3, S/P  PROSTATECTOMY  . History of squamous cell carcinoma excision    2011  left knee  . Hyperlipidemia   . Hyperplastic colon polyp 2009  . Hypertension   . Male hypogonadism   . Melanoma ([place])    left 4th toe  . Nephrolithiasis   . Prostate cancer ([place])   . Ventricular fibrillation ([place]) 04/07/2019   VF, en route witnessed by EMS, defibrillated x 1    Current Outpatient Medications on File Prior to Visit  Medication Sig Dispense Refill  . ascorbic acid (VITAMIN C) 1000 MG tablet Take 1,000 mg by mouth daily.    Marland Kitchen aspirin EC 81 MG EC tablet Take 1 tablet (81 mg total) by mouth daily.    Marland Kitchen atorvastatin (LIPITOR) 10 MG tablet Take 10 mg by mouth at bedtime.     . calcium carbonate (TUMS - DOSED IN MG ELEMENTAL CALCIUM) 500 MG chewable tablet Chew 2 tablets by mouth as needed for indigestion or heartburn.     . Cholecalciferol (VITAMIN D) 2000 units CAPS Take 2,000 Units by mouth 2 (two) times a day.     . cyclobenzaprine (FLEXERIL) 10 MG tablet Take 1 tablet (10 mg total) by mouth 3 (three) times  daily as needed for muscle spasms. 8 tablet 0  . FAMOTIDINE PO Take 10 mg by mouth as needed.    . hydrochlorothiazide (HYDRODIURIL) 25 MG tablet Take 1 tablet (25 mg total) by mouth daily. 90 tablet 3  . ibuprofen (ADVIL) 400 MG tablet Take 400 mg by mouth as needed for moderate pain.    . isosorbide mononitrate (IMDUR) 30 MG 24 hr tablet Take 30 mg by mouth daily. Half a tablet (15mg )    . losartan (COZAAR) 100 MG tablet Take 1 tablet (100 mg total) by mouth daily. 90 tablet 3  . MAGNESIUM CITRATE PO Take 250 mg by mouth daily.    . metoprolol tartrate (LOPRESSOR) 50 MG tablet Take 1 tablet (50 mg total) by mouth 3 (three) times daily. 270 tablet 3  . Multiple Vitamin (MULTIVITAMIN WITH MINERALS) TABS tablet Take 1 tablet by mouth daily.    . nitroGLYCERIN  (NITROSTAT) 0.4 MG SL tablet Place 1 tablet (0.4 mg total) under the tongue every 5 (five) minutes x 3 doses as needed for chest pain. 25 tablet 0  . potassium chloride (KLOR-CON) 10 MEQ tablet Take 1 tablet (10 mEq total) by mouth daily. 90 tablet 3  . Probiotic Product (PROBIOTIC DAILY PO) Take 1 capsule by mouth daily.     . propranolol (INDERAL) 10 MG tablet Take 1 tablet (10 mg total) by mouth 4 (four) times daily as needed (palpitations). 30 tablet 5  . ticagrelor (BRILINTA) 90 MG TABS tablet Take 1 tablet (90 mg total) by mouth 2 (two) times daily. 180 tablet 3  . traMADol (ULTRAM) 50 MG tablet Take 1 tablet (50 mg total) by mouth every 4 (four) hours as needed for moderate pain. 28 tablet 0   No current facility-administered medications on file prior to visit.    No Known Allergies  Assessment/Plan:  1. Hyperlipidemia -

## 2019-10-16 ENCOUNTER — Ambulatory Visit (INDEPENDENT_AMBULATORY_CARE_PROVIDER_SITE_OTHER): Payer: Medicare HMO | Admitting: Pharmacist

## 2019-10-16 ENCOUNTER — Ambulatory Visit (HOSPITAL_COMMUNITY): Payer: Medicare HMO | Attending: Cardiology

## 2019-10-16 ENCOUNTER — Other Ambulatory Visit: Payer: Self-pay

## 2019-10-16 DIAGNOSIS — I2510 Atherosclerotic heart disease of native coronary artery without angina pectoris: Secondary | ICD-10-CM

## 2019-10-16 DIAGNOSIS — Z951 Presence of aortocoronary bypass graft: Secondary | ICD-10-CM | POA: Diagnosis not present

## 2019-10-16 MED ORDER — ATORVASTATIN CALCIUM 20 MG PO TABS: 20.0000 mg | ORAL_TABLET | Freq: Every day | ORAL | 3 refills | Status: DC

## 2019-10-16 NOTE — Progress Notes (Signed)
Patient ID: [name]                 DOB: 1946-06-30                    MRN: [id number]     HPI: Jared Roberts is a 74 y.o. male patient referred to lipid clinic by Dr. Acie Fredrickson. PMH is significant for CAD s/p CABG on 03/11/20 with subsequent Mis on 04/07/19 and 04/21/19, HTN, TIA, and carotid stenosis. He was last seen by Dr. Acie Fredrickson on 09/24/19 where he reported still having back pain that radiated around to his stomach. His lipid panel was still elevated and above goal, so he was referred to lipid clinic.  Patient presents to clinic for follow up of his hyperlipidemia and hypertriglyceridemia. Patient stated that he has tolerated statins well in the past with little to no muscle pain. He has always tried to stay on the lowest effective dose but is very willing to increase his atorvastatin dose. He believes his previous pain was related to his CAD. Patient endorses a diet limited in fat but does enjoy fried seafood platters and snacks frequently on salty, crunchy snacks and tends to eat lots of carbohydrates including rice and potatoes. Patient tries to walk as he can but he is becoming quickly short of breath recently. Patient to undergo ECHO today.   Current Medications: atorvastatin 10mg  daily Intolerances: rosuvastatin 5mg  daily (didn't feel as well as when he was on Lipitor - not a true intolerance just preferred atorvastatin) Risk Factors: progressive ASCVD (s/p CABG with 2 subsequent MI's) LDL goal: < 55 mg/dL  Diet: fried seafood, few sweets, 1 glass of red wine per day, lots of snacks (salty & crunchy), lots of carbs (rice, potatoes)  Exercise: tries to go on walks and do yard work but has been limited due to his recent shortness of breath  Family History: The patient'sfamily history includes Arthritis in his mother; Congestive Heart Failure in his mother; Diabetes Mellitus II in his mother; Osteoarthritis in his mother; Other in his father.  Social History: Never smoker, 1 glass of  wine daily  Labs: 09/22/19: TC 157, TG 285, HDL 56, LDL 57 (atorvastatin 10mg  daily) 04/08/19: TC 147, TG 187, HDL 43, LDL 67 (atorvastatin 10mg  daily)  Past Medical History:  Diagnosis Date  . Coronary artery disease   . DDD (degenerative disc disease), cervical   . Depression   . ED (erectile dysfunction) of organic origin   . Generalized osteoarthritis   . GERD (gastroesophageal reflux disease)   . Hiatal hernia   . History of chronic bronchitis   . History of gastritis    2003  . History of Helicobacter pylori infection    2003  . History of melanoma excision    2015--  right ear  . History of prostate cancer urologist-  dr [name]   2007--  pT2a Nx Mx,  Gleason 3+3, S/P  PROSTATECTOMY  . History of squamous cell carcinoma excision    2011  left knee  . Hyperlipidemia   . Hyperplastic colon polyp 2009  . Hypertension   . Male hypogonadism   . Melanoma ([place])    left 4th toe  . Nephrolithiasis   . Prostate cancer ([place])   . Ventricular fibrillation ([place]) 04/07/2019   VF, en route witnessed by EMS, defibrillated x 1    Current Outpatient Medications on File Prior to Visit  Medication Sig Dispense Refill  . ascorbic acid (VITAMIN  C) 1000 MG tablet Take 1,000 mg by mouth daily.    Marland Kitchen aspirin EC 81 MG EC tablet Take 1 tablet (81 mg total) by mouth daily.    Marland Kitchen atorvastatin (LIPITOR) 10 MG tablet Take 10 mg by mouth at bedtime.     . calcium carbonate (TUMS - DOSED IN MG ELEMENTAL CALCIUM) 500 MG chewable tablet Chew 2 tablets by mouth as needed for indigestion or heartburn.     . Cholecalciferol (VITAMIN D) 2000 units CAPS Take 2,000 Units by mouth 2 (two) times a day.     . cyclobenzaprine (FLEXERIL) 10 MG tablet Take 1 tablet (10 mg total) by mouth 3 (three) times daily as needed for muscle spasms. 8 tablet 0  . FAMOTIDINE PO Take 10 mg by mouth as needed.    . hydrochlorothiazide (HYDRODIURIL) 25 MG tablet Take 1 tablet (25 mg total) by mouth daily. 90 tablet 3  . ibuprofen  (ADVIL) 400 MG tablet Take 400 mg by mouth as needed for moderate pain.    . isosorbide mononitrate (IMDUR) 30 MG 24 hr tablet Take 30 mg by mouth daily. Half a tablet (15mg )    . losartan (COZAAR) 100 MG tablet Take 1 tablet (100 mg total) by mouth daily. 90 tablet 3  . MAGNESIUM CITRATE PO Take 250 mg by mouth daily.    . metoprolol tartrate (LOPRESSOR) 50 MG tablet Take 1 tablet (50 mg total) by mouth 3 (three) times daily. 270 tablet 3  . Multiple Vitamin (MULTIVITAMIN WITH MINERALS) TABS tablet Take 1 tablet by mouth daily.    . nitroGLYCERIN (NITROSTAT) 0.4 MG SL tablet Place 1 tablet (0.4 mg total) under the tongue every 5 (five) minutes x 3 doses as needed for chest pain. 25 tablet 0  . potassium chloride (KLOR-CON) 10 MEQ tablet Take 1 tablet (10 mEq total) by mouth daily. 90 tablet 3  . Probiotic Product (PROBIOTIC DAILY PO) Take 1 capsule by mouth daily.     . propranolol (INDERAL) 10 MG tablet Take 1 tablet (10 mg total) by mouth 4 (four) times daily as needed (palpitations). 30 tablet 5  . ticagrelor (BRILINTA) 90 MG TABS tablet Take 1 tablet (90 mg total) by mouth 2 (two) times daily. 180 tablet 3  . traMADol (ULTRAM) 50 MG tablet Take 1 tablet (50 mg total) by mouth every 4 (four) hours as needed for moderate pain. 28 tablet 0   No current facility-administered medications on file prior to visit.    No Known Allergies  Assessment/Plan:  1. Hyperlipidemia - Based on LDL goal <55, patient is close to goal on atorvastatin 10mg  daily. Based on current tolerance of atorvastatin, patient's willingness to increase his dose, and benefit of statins in stabilizing plaque and causing plaque regression, we will increase atorvastatin to 20mg  daily. If patient does not tolerate dose, can consider addition of Zetia or switching to an alternative statin such as pravastatin. Patient was encouraged to continue limiting his intake of red meat and fried foods and continue to increase his exercise as  he can. Will recheck labs for 8 weeks.   2. Hypertriglyceridemia - Based on TG goal <150, patient is above goal. Patient would benefit from Vascepa to lower triglycerides and provide cardiovascular benefit. Patient's copay, however, is around $100 a month and his income is too high to qualify for the [employer]. Because patient endorsed room for improvement in his intake of carbohydrates, patient will try lifestyle modifications first including reducing his snacking on carbs  and limiting his rice and potatoes. Can consider fish oil in the future if price decreases. We will follow up with labs in 8 weeks.   Ladoris Gene, PharmD Candidate  Megan E. Supple, PharmD, BCACP, [place] [id number], [place], [place] [address] Phone: [phone]; Fax: [phone] 10/16/2019 12:23 PM

## 2019-10-16 NOTE — Patient Instructions (Addendum)
It was great meeting you today!  Your LDL is above your goal <55. Increase your atorvastatin to 20mg  daily. Continue to limit your intake of red meat and increase your exercise to 150 minutes per week.   Your triglycerides are above your goal of <150. Try to make those lifestyle changes of reducing your carbohydrate intake throughout the day.   We will follow up with lab work in 8 weeks.

## 2019-10-23 DIAGNOSIS — Z125 Encounter for screening for malignant neoplasm of prostate: Secondary | ICD-10-CM | POA: Diagnosis not present

## 2019-10-23 DIAGNOSIS — E7800 Pure hypercholesterolemia, unspecified: Secondary | ICD-10-CM | POA: Diagnosis not present

## 2019-10-23 DIAGNOSIS — I10 Essential (primary) hypertension: Secondary | ICD-10-CM | POA: Diagnosis not present

## 2019-10-29 DIAGNOSIS — Z0001 Encounter for general adult medical examination with abnormal findings: Secondary | ICD-10-CM | POA: Diagnosis not present

## 2019-10-29 DIAGNOSIS — K219 Gastro-esophageal reflux disease without esophagitis: Secondary | ICD-10-CM | POA: Diagnosis not present

## 2019-10-29 DIAGNOSIS — Z8546 Personal history of malignant neoplasm of prostate: Secondary | ICD-10-CM | POA: Diagnosis not present

## 2019-10-29 DIAGNOSIS — E782 Mixed hyperlipidemia: Secondary | ICD-10-CM | POA: Diagnosis not present

## 2019-10-29 DIAGNOSIS — M546 Pain in thoracic spine: Secondary | ICD-10-CM | POA: Diagnosis not present

## 2019-10-29 DIAGNOSIS — I6523 Occlusion and stenosis of bilateral carotid arteries: Secondary | ICD-10-CM | POA: Diagnosis not present

## 2019-10-29 DIAGNOSIS — I10 Essential (primary) hypertension: Secondary | ICD-10-CM | POA: Diagnosis not present

## 2019-10-29 DIAGNOSIS — M545 Low back pain: Secondary | ICD-10-CM | POA: Diagnosis not present

## 2019-10-29 DIAGNOSIS — Z7982 Long term (current) use of aspirin: Secondary | ICD-10-CM | POA: Diagnosis not present

## 2019-10-29 DIAGNOSIS — I2510 Atherosclerotic heart disease of native coronary artery without angina pectoris: Secondary | ICD-10-CM | POA: Diagnosis not present

## 2019-10-29 DIAGNOSIS — I252 Old myocardial infarction: Secondary | ICD-10-CM | POA: Diagnosis not present

## 2019-11-04 DIAGNOSIS — I10 Essential (primary) hypertension: Secondary | ICD-10-CM | POA: Diagnosis not present

## 2019-11-04 DIAGNOSIS — M8580 Other specified disorders of bone density and structure, unspecified site: Secondary | ICD-10-CM | POA: Diagnosis not present

## 2019-11-04 DIAGNOSIS — M8589 Other specified disorders of bone density and structure, multiple sites: Secondary | ICD-10-CM | POA: Diagnosis not present

## 2019-11-04 DIAGNOSIS — G8929 Other chronic pain: Secondary | ICD-10-CM | POA: Diagnosis not present

## 2019-11-04 DIAGNOSIS — R918 Other nonspecific abnormal finding of lung field: Secondary | ICD-10-CM | POA: Diagnosis not present

## 2019-11-04 DIAGNOSIS — M546 Pain in thoracic spine: Secondary | ICD-10-CM | POA: Diagnosis not present

## 2019-11-05 ENCOUNTER — Encounter: Payer: Self-pay | Admitting: Family Medicine

## 2019-11-05 ENCOUNTER — Other Ambulatory Visit: Payer: Self-pay

## 2019-11-05 ENCOUNTER — Ambulatory Visit: Payer: Medicare HMO | Admitting: Family Medicine

## 2019-11-05 VITALS — BP 128/88 | HR 78 | Ht 70.0 in | Wt 179.6 lb

## 2019-11-05 DIAGNOSIS — M546 Pain in thoracic spine: Secondary | ICD-10-CM | POA: Diagnosis not present

## 2019-11-05 DIAGNOSIS — M545 Low back pain, unspecified: Secondary | ICD-10-CM

## 2019-11-05 DIAGNOSIS — G8929 Other chronic pain: Secondary | ICD-10-CM

## 2019-11-05 MED ORDER — GABAPENTIN 300 MG PO CAPS: 300.0000 mg | ORAL_CAPSULE | Freq: Three times a day (TID) | ORAL | 1 refills | Status: DC | PRN

## 2019-11-05 NOTE — Patient Instructions (Addendum)
Thank you for coming in today. Lets plan for PT.  Try using a heating pad and consider TENS. Try gabapentin at bed time.  Recheck with me in 6 weeks.  Contact me or recheck sooner if you have a problem or are not doing well.   I think this is mostly muscle dysfunction.   Figure 8 brace.

## 2019-11-05 NOTE — Progress Notes (Addendum)
Subjective:    CC: Mid and low back pain  I, Wendy Poet, LAT, ATC, am serving as scribe for Dr. Lynne Leader.  HPI: Pt is a 74 y/o male presenting w/ c/o mid and low back pain since fall 2020 after he suffered a heart attack and had to have CPR.  He locates his pain to the musculature on either side of his spine running from the distal scapulae to his pelvis.  He rates his pain as mod-severe and describes his pain as sharp and aching .  He had a CABG on 03/12/19.  He had a stent placed around the same time and is currently on Brilinta and aspirin for antiplatelet.  Radiating pain: into L glute and L thigh LE numbness/tingling: No LE weakness: No Aggravating factors: Rolling over in bed; transitioning from supine to sit; spine rotation Treatments tried: Advil  Diagnostic imaging: L-spine and T-spine XR - 10/29/19 at Dr. Pennie Banter office  Pertinent review of Systems: No fevers or chills.  Relevant historical information: History CAD with CPR as above   Objective:    Vitals:   11/05/19 1244  BP: 128/88  Pulse: 78  SpO2: 97%   General: Well Developed, well nourished, and in no acute distress.   MSK: T-spine: Nontender to spinal midline.  Mildly tender palpation paraspinal musculature. Pain with thoracic extension. Normal shoulder motion. Strength intact.  Lab and Radiology Results No results found for this or any previous visit (from the past 72 hour(s)). No results found.  EXAM: CHEST - 2 VIEW  COMPARISON: Thoracic radiographs October 29, 2019; chest radiograph April 19, 2019  FINDINGS: Lungs are clear. No diaphragmatic eventration. Heart size and pulmonary vascular normal. No adenopathy. Patient is status post coronary artery bypass grafting. No bone lesions.  IMPRESSION: Lungs clear. The opacity overlying the lower thoracic region on recent thoracic radiographs is not evident currently. No appreciable diaphragmatic asymmetry or eventration noted. Cardiac  silhouette normal. No adenopathy. Status post coronary artery bypass grafting.   Electronically Signed By: Lowella Grip III M.D. On: 11/04/2019 14:11  EXAM: THORACIC SPINE 2 VIEWS  COMPARISON: 04/19/2019  FINDINGS: There are mild compression deformities noted in the lower thoracic spine which appears stable from the prior chest x-ray. Additionally there is soft tissue density overlying the lower thoracic spine which is likely related to focal eventration of the hemidiaphragm. Comparison with chest film would be helpful. No paraspinal mass lesion is seen. No pedicle abnormality is seen.  IMPRESSION: Chronic compression deformities in the lower thoracic spine.  Soft tissue density on the lateral projection posteriorly likely related to eventration of the hemidiaphragm although not visualized on the frontal film due to patient positioning. Correlation with plain film is recommended.   Electronically Signed By: Inez Catalina M.D. On: 10/29/2019 17:00   EXAM: LUMBAR SPINE - 2-3 VIEW  COMPARISON: None.  FINDINGS: Five lumbar type vertebral bodies are well visualized. Vertebral body height is well maintained. Mild osteophytic changes are noted. Disc space narrowing is noted at L1-2 and L2-3. Mild aortic calcifications are seen. No soft tissue abnormality is noted.  IMPRESSION: Mild degenerative change without acute abnormality.   Electronically Signed By: Inez Catalina M.D. On: 10/29/2019 17:01  I, Lynne Leader, personally (independently) visualized and performed the interpretation of the images attached in this note.   Impression and Recommendations:    Assessment and Plan: 74 y.o. male with thoracic back pain extending bilateral chest anterior chest.  Symptoms started around the time he was hospitalized with  heart attack and ultimately requiring defibrillation and CABG he has had some chronic dysfunction since.  This was a lesser issue because he was reluctant to  attend to it due to COVID-19 until recently.  He notes his pain is moderate to severe at times.  It does interfere with normal sleep and sometimes ability to stand up straight and walk normally.  X-ray obtained by PCP recently does show old compression fracture at thoracic spine near area of pain.  This is certainly likely to be a component of his pain.  However I think the majority of his pain is muscle dysfunction and weakness.  He has not had trial of physical therapy.  Plan for trial of physical therapy.  Additionally some of this pain could be thoracic radicular pain.  Trial gabapentin especially at bedtime.  Would not consider vertebroplasty or kyphoplasty at this time as he is currently taking strong antiplatelet agents and contraindicated for vertebroplasty until at least 1 year after his stent placement in September.  Recheck back in about 6 weeks.  Return sooner if needed.  Precautions reviewed.Marland Kitchen  PDMP not reviewed this encounter. Orders Placed This Encounter  Procedures  . Ambulatory referral to Physical Therapy    Referral Priority:   Routine    Referral Type:   Physical Medicine    Referral Reason:   Specialty Services Required    Requested Specialty:   Physical Therapy   Meds ordered this encounter  Medications  . gabapentin (NEURONTIN) 300 MG capsule    Sig: Take 1 capsule (300 mg total) by mouth 3 (three) times daily as needed (nerve pain). Take mostly at night for nerve pain    Dispense:  90 capsule    Refill:  1    Discussed warning signs or symptoms. Please see discharge instructions. Patient expresses understanding.   The above documentation has been reviewed and is accurate and complete Lynne Leader   Addendum to correct date of CABG from 2021 to 2020 in HPI

## 2019-11-06 ENCOUNTER — Encounter: Payer: Self-pay | Admitting: Family Medicine

## 2019-11-11 DIAGNOSIS — R69 Illness, unspecified: Secondary | ICD-10-CM | POA: Diagnosis not present

## 2019-11-21 ENCOUNTER — Other Ambulatory Visit: Payer: Self-pay

## 2019-11-21 ENCOUNTER — Encounter: Payer: Self-pay | Admitting: Family Medicine

## 2019-11-21 ENCOUNTER — Telehealth: Payer: Self-pay

## 2019-11-21 ENCOUNTER — Ambulatory Visit: Payer: Medicare HMO | Admitting: Family Medicine

## 2019-11-21 VITALS — BP 124/80 | HR 81 | Ht 70.0 in | Wt 182.0 lb

## 2019-11-21 DIAGNOSIS — G8929 Other chronic pain: Secondary | ICD-10-CM | POA: Diagnosis not present

## 2019-11-21 DIAGNOSIS — M546 Pain in thoracic spine: Secondary | ICD-10-CM | POA: Diagnosis not present

## 2019-11-21 DIAGNOSIS — S22000D Wedge compression fracture of unspecified thoracic vertebra, subsequent encounter for fracture with routine healing: Secondary | ICD-10-CM

## 2019-11-21 DIAGNOSIS — M5414 Radiculopathy, thoracic region: Secondary | ICD-10-CM | POA: Diagnosis not present

## 2019-11-21 NOTE — Progress Notes (Signed)
I, Wendy Poet, LAT, ATC, am serving as scribe for Dr. Lynne Leader.  Jared Roberts is a 74 y.o. male who presents to [place] at [facility] today for low back pain after unloading his car/trunk yesterday from a trip.  He states that he was unable to sleep last night due to being in so much pain.  He was last seen by Dr. Georgina Snell on 11/05/19 and was prescribed Gabapentin and referred to outpatient PT.  He has his first PT session on 11/24/19.  Today, he locates his pain to his upper abdomen.  He reports radiating pain both laterally toward his ribs and superiorly toward his chest.  He denies any SOB but does note an elevated BP and sweating last evening.  He has taken some nitroglycerin and 2 Advil.   Pertinent review of systems: No fevers or chills  Relevant historical information: CABG   Exam:  BP 124/80 (BP Location: Right Arm, Patient Position: Sitting, Cuff Size: Normal)   Pulse 81   Ht 5\' 10"  (1.778 m)   Wt 182 lb (82.6 kg)   SpO2 96%   BMI 26.11 kg/m  General: Well Developed, well nourished, and in no acute distress.   MSK: Thoracic spine normal-appearing nontender normal motion. Abdomen normal-appearing not particularly tender to palpation. Diastases recti present with sitting position. Chest wall nontender Lungs normal work of breathing.   Lab and Radiology Results  EXAM: CHEST - 2 VIEW  COMPARISON: Thoracic radiographs October 29, 2019; chest radiograph April 19, 2019  FINDINGS: Lungs are clear. No diaphragmatic eventration. Heart size and pulmonary vascular normal. No adenopathy. Patient is status post coronary artery bypass grafting. No bone lesions.  IMPRESSION: Lungs clear. The opacity overlying the lower thoracic region on recent thoracic radiographs is not evident currently. No appreciable diaphragmatic asymmetry or eventration noted. Cardiac silhouette normal. No adenopathy. Status post coronary artery bypass  grafting.   Electronically Signed By: Lowella Grip III M.D. On: 11/04/2019 14:11  EXAM: THORACIC SPINE 2 VIEWS  COMPARISON: 04/19/2019  FINDINGS: There are mild compression deformities noted in the lower thoracic spine which appears stable from the prior chest x-ray. Additionally there is soft tissue density overlying the lower thoracic spine which is likely related to focal eventration of the hemidiaphragm. Comparison with chest film would be helpful. No paraspinal mass lesion is seen. No pedicle abnormality is seen.  IMPRESSION: Chronic compression deformities in the lower thoracic spine.  Soft tissue density on the lateral projection posteriorly likely related to eventration of the hemidiaphragm although not visualized on the frontal film due to patient positioning. Correlation with plain film is recommended.   Electronically Signed By: Inez Catalina M.D. On: 10/29/2019 17:00   EXAM: LUMBAR SPINE - 2-3 VIEW  COMPARISON: None.  FINDINGS: Five lumbar type vertebral bodies are well visualized. Vertebral body height is well maintained. Mild osteophytic changes are noted. Disc space narrowing is noted at L1-2 and L2-3. Mild aortic calcifications are seen. No soft tissue abnormality is noted.  IMPRESSION: Mild degenerative change without acute abnormality.   Electronically Signed By: Inez Catalina M.D. On: 10/29/2019 17:01   I, Lynne Leader, personally (independently) visualized and performed the interpretation of the images attached in this note.    Assessment and Plan: 74 y.o. male with persistent thoracic back pain extending to chest.  X-ray obtained recently does show thoracic compression fractures that appear to be old.  Chief leading diagnosis is thoracic radiculopathy.  Given that he is still having quite problematic  exacerbations of this I do think it is reasonable to proceed with thoracic spine MRI to further characterize compression fractures as well  as possible radiculopathy or cord compression.  Plan to proceed with physical therapy as scheduled.  Continue Flexeril gabapentin as needed.  Check back after MRI.   PDMP not reviewed this encounter. Orders Placed This Encounter  Procedures  . MR THORACIC SPINE WO CONTRAST    Standing Status:   Future    Standing Expiration Date:   01/20/2021    Order Specific Question:   ** REASON FOR EXAM (FREE TEXT)    Answer:   eval poss tspine radiculopathy and eval compression fx seen on xray    Order Specific Question:   What is the patient's sedation requirement?    Answer:   No Sedation    Order Specific Question:   Does the patient have a pacemaker or implanted devices?    Answer:   No    Order Specific Question:   Preferred imaging location?    Answer:   [facility] (table limit-550lbs)    Order Specific Question:   Radiology Contrast Protocol - do NOT remove file path    Answer:   [email or web address]   No orders of the defined types were placed in this encounter.    Discussed warning signs or symptoms. Please see discharge instructions. Patient expresses understanding.   The above documentation has been reviewed and is accurate and complete Lynne Leader

## 2019-11-21 NOTE — Telephone Encounter (Signed)
Patient called hurt himself yesterday unloading his car from after a trip. Patient states it did not hurt right after unloading started hurting that night when laying down. Patient has been in pain all night and could not sleep. Patient does states that he was walking up steps with the stuff he was unloading from the car. Patient was added to today schedule at 11:15

## 2019-11-21 NOTE — Patient Instructions (Signed)
Thank you for coming in today. Plan for MRI Tspine.  Expect this to be scheduled soonish.  You will hear about scheduling soon.  OK to go to PT.  Likely recheck following MRI.   Ok to take low dose cyclobenzaprine and gabapentin together but it may make you sleepy.

## 2019-11-24 ENCOUNTER — Other Ambulatory Visit: Payer: Self-pay

## 2019-11-24 ENCOUNTER — Encounter: Payer: Self-pay | Admitting: Family Medicine

## 2019-11-24 ENCOUNTER — Ambulatory Visit (INDEPENDENT_AMBULATORY_CARE_PROVIDER_SITE_OTHER): Payer: Medicare HMO | Admitting: Physical Therapy

## 2019-11-24 ENCOUNTER — Encounter: Payer: Self-pay | Admitting: Physical Therapy

## 2019-11-24 DIAGNOSIS — M546 Pain in thoracic spine: Secondary | ICD-10-CM | POA: Diagnosis not present

## 2019-11-24 DIAGNOSIS — R1013 Epigastric pain: Secondary | ICD-10-CM

## 2019-11-24 NOTE — Therapy (Signed)
[facility] [address] [place], [place], [address] Phone: [phone]   Fax:  [phone]  Physical Therapy Evaluation  Patient Details  Name: Jared Roberts MRN: [id number] Date of Birth: 1945-08-08 Referring Provider (PT): Lynne Leader   Encounter Date: 11/24/2019  PT End of Session - 11/24/19 1252    Visit Number  1    Number of Visits  12    Date for PT Re-Evaluation  01/05/20    Authorization Type  [employer]    PT Start Time  1210    PT Stop Time  1233    PT Time Calculation (min)  23 min    Activity Tolerance  Patient tolerated treatment well    Behavior During Therapy  [facility] for tasks assessed/performed       Past Medical History:  Diagnosis Date  . Coronary artery disease   . DDD (degenerative disc disease), cervical   . Depression   . ED (erectile dysfunction) of organic origin   . Generalized osteoarthritis   . GERD (gastroesophageal reflux disease)   . Hiatal hernia   . History of chronic bronchitis   . History of gastritis    2003  . History of Helicobacter pylori infection    2003  . History of melanoma excision    2015--  right ear  . History of prostate cancer urologist-  dr [name]   2007--  pT2a Nx Mx,  Gleason 3+3, S/P  PROSTATECTOMY  . History of squamous cell carcinoma excision    2011  left knee  . Hyperlipidemia   . Hyperplastic colon polyp 2009  . Hypertension   . Male hypogonadism   . Melanoma ([place])    left 4th toe  . Nephrolithiasis   . Prostate cancer ([place])   . Ventricular fibrillation ([place]) 04/07/2019   VF, en route witnessed by EMS, defibrillated x 1    Past Surgical History:  Procedure Laterality Date  . AMPUTATION TOE Left 09/07/2015   Procedure: PARTIAL AMPUTATION TOE 4TH LEFT;  Surgeon: [name], MD;  Location: [place];  Service: Podiatry;  Laterality: Left;  . APPENDECTOMY  1970's  . CORONARY ARTERY BYPASS GRAFT N/A 03/12/2019   Procedure: CORONARY ARTERY  BYPASS GRAFTING (CABG) x 3 WITH ENDOSCOPIC HARVESTING OF RIGHT GREATER SAPHENOUS VEIN;  Surgeon: [name], MD;  Location: [place];  Service: Open Heart Surgery;  Laterality: N/A;  . CORONARY BALLOON ANGIOPLASTY N/A 04/07/2019   Procedure: CORONARY BALLOON ANGIOPLASTY;  Surgeon: [name], MD;  Location: [place] [facility];  Service: Cardiovascular;  Laterality: N/A;  . CORONARY STENT INTERVENTION N/A 04/21/2019   Procedure: CORONARY STENT INTERVENTION;  Surgeon: [name], MD;  Location: [place] [facility];  Service: Cardiovascular;  Laterality: N/A;  . CYSTO/  CLOT EVACUATION POST PROSTATECTOMY  11-19-2005  . CYSTO/  REMOVAL URETHRAL STONE AND URETHRAL STAPLES  06-02-2010  . CYSTO/  URETEROSCOPIC STONE EXTRACTION  X2  1990's  . KIDNEY STONE SURGERY    . LEFT HEART CATH AND CORONARY ANGIOGRAPHY N/A 02/25/2019   Procedure: LEFT HEART CATH AND CORONARY ANGIOGRAPHY;  Surgeon: [name], MD;  Location: [place] [facility];  Service: Cardiovascular;  Laterality: N/A;  . LEFT HEART CATH AND CORS/GRAFTS ANGIOGRAPHY N/A 04/07/2019   Procedure: LEFT HEART CATH AND CORS/GRAFTS ANGIOGRAPHY;  Surgeon: [name], MD;  Location: [place] [facility];  Service: Cardiovascular;  Laterality: N/A;  . LEFT HEART CATH AND CORS/GRAFTS ANGIOGRAPHY N/A 04/21/2019  Procedure: LEFT HEART CATH AND CORS/GRAFTS ANGIOGRAPHY;  Surgeon: [name], MD;  Location: [place] [facility];  Service: Cardiovascular;  Laterality: N/A;  . MELANOMA EXCISION  2015   right ear  . ROBOT ASSISTED LAPAROSCOPIC RADICAL PROSTATECTOMY  11-09-2005  . SKIN CANCER EXCISION    . TEE WITHOUT CARDIOVERSION N/A 03/12/2019   Procedure: TRANSESOPHAGEAL ECHOCARDIOGRAM (TEE);  Surgeon: [name], MD;  Location: [place];  Service: Open Heart Surgery;  Laterality: N/A;  . TONSILLECTOMY  child    There were no vitals filed for this visit.   Subjective Assessment - 11/24/19 1215    Subjective  Pt  states pain in thorcic region , thinks it started back in September with hospitalization.Notes pain in back is doing a bit better, but is having quite a bit of abdominal pain. Unable to find comfortable position or for this to go away. He saw Dr on Friday, was having these symptoms at the time, but have not improved. Does have thoracic MRI ordered, but not scheduled.    Limitations  Lifting;Standing;Walking;House hold activities;Sitting    Patient Stated Goals  decreased pain    Currently in Pain?  Yes    Pain Score  8     Pain Location  Back    Pain Orientation  Mid    Pain Descriptors / Indicators  Aching    Pain Type  Acute pain    Pain Onset  1 to 4 weeks ago    Pain Frequency  Intermittent    Pain Relieving Factors  none         [facility] PT Assessment - 11/24/19 0001      Assessment   Medical Diagnosis  Thoracic Pain    Referring Provider (PT)  Lynne Leader    Prior Therapy  no      Balance Screen   Has the patient fallen in the past 6 months  No      Prior Function   Level of Independence  Independent      Cognition   Overall Cognitive Status  Within Functional Limits for tasks assessed      ROM / Strength   AROM / PROM / Strength  AROM;Strength      AROM   AROM Assessment Site  Thoracic;Lumbar    Thoracic Flexion  wfl    Thoracic Extension  wfl    Thoracic - Right Side Bend  wfl    Thoracic - Left Side Bend  wfl    Thoracic - Right Rotation  wfl    Thoracic - Left Rotation  wfl      Strength   Overall Strength Comments  UE: 4+/5 gross, LE: 4+/5 gross       Palpation   Palpation comment  Mild tenderness in bil thoracic paraspinals, minimal pain in central thoracic spine or lumbar spine. Pain to palpate anterior/central abdomen between belly button and sternum.                Objective measurements completed on examination: See above findings.      [facility] Adult PT Treatment/Exercise - 11/24/19 0001      Exercises   Exercises  Lumbar      Lumbar  Exercises: Stretches   Other Lumbar Stretch Exercise  scap squeeze x10 seated;  Posterior shoulder /thoracic stretch 30 sec x2 bil;              PT Education - 11/24/19 1251    Education Details  Discussed  waiting until he sees MD again, and /or has further testing, and decreased abdominal pain before continuing PT.    Person(s) Educated  Patient    Methods  Explanation    Comprehension  Verbalized understanding       PT Short Term Goals - 11/24/19 1256      PT SHORT TERM GOAL #1   Title  TBD after further assessment        PT Long Term Goals - 11/24/19 1256      PT LONG TERM GOAL #1   Title  TBD after further assessment             Plan - 11/24/19 1258    Clinical Impression Statement  Pt presents with primary complaint of thoracic pain today, as well as abdominal pain. Pt unable to tolerate full exam, but does have mild soreness to palpate thoracic paraspinals. Minimal pain in central thoracic spine or lumbar spine. Discussed posture, and light movement/stretches for comfort. Pt also with mod-severe pain in central abdomen/stomach with palpation but also with resting. Due to discomfort of this pain, pt will follow up with Sports med or PCP prior to returning to PT. If abmonimal pain clear or reduced, pt would benefit from PT for postural education, strengthening, and to reduce pain in thoracic spine. Pt in agreement with plan. Did review need to seek medical attention sooner, if abdominal pain worsens or changes. Pt states understanding.    Examination-Activity Limitations  Locomotion Level;Bend;Lift;Stand;Squat;Carry;Sleep    Examination-Participation Restrictions  Meal Prep;Cleaning;Driving;Shop;Laundry;Yard Work    Stability/Clinical Decision Making  Stable/Uncomplicated    Clinical Decision Making  Low    Rehab Potential  Good    PT Frequency  2x / week    PT Duration  6 weeks    PT Treatment/Interventions  ADLs/Self Care Home Management;Cryotherapy;Electrical  Stimulation;Gait training;Ultrasound;Traction;Iontophoresis 4mg /ml Dexamethasone;Moist Heat;Functional mobility training;Therapeutic activities;Therapeutic exercise;Neuromuscular re-education;Patient/family education;Manual techniques;Dry needling;Passive range of motion;Scar mobilization;Vasopneumatic Device;Spinal Manipulations    Consulted and Agree with Plan of Care  Patient       Patient will benefit from skilled therapeutic intervention in order to improve the following deficits and impairments:  Decreased range of motion, Increased muscle spasms, Decreased activity tolerance, Pain, Impaired flexibility, Improper body mechanics, Decreased mobility, Decreased strength  Visit Diagnosis: Pain in thoracic spine     Problem List Patient Active Problem List   Diagnosis Date Noted  . Hyperlipidemia 04/22/2019  . Unstable angina ([place]) 04/19/2019  . HTN (hypertension) 04/09/2019  . Non-ST elevation (NSTEMI) myocardial infarction ([place]) 04/07/2019  . Ventricular fibrillation ([place]) 04/07/2019  . NSTEMI (non-ST elevated myocardial infarction) ([place]) 04/07/2019  . S/P CABG x 3 03/12/2019  . Coronary artery disease 03/12/2019  . Effort angina ([facility])   . Chest pain of uncertain etiology [id number]  . Benign essential HTN 01/31/2019  . Mild obstructive sleep apnea 04/17/2018  . Postviral fatigue syndrome 03/27/2018  . Snoring 03/27/2018  . Excessive daytime sleepiness 03/27/2018  . Confusion 02/13/2018  . Malignant melanoma of toe of left foot ([place]) 09/20/2015  . History of prostate cancer 09/20/2015   Lyndee Hensen, PT, DPT 4:26 PM  11/24/19    [place], [place], [address] Phone: [phone]   Fax:  [phone]  Name: YEHOSHUA NAPORA MRN: [id number] Date of Birth: [birth year]

## 2019-11-27 NOTE — Telephone Encounter (Signed)
MRI T-spine will not look for gallstones.  However I just ordered an ultrasound of your abdomen which will detect gallstones if present.  You should hear soon about scheduling this.  You can also call [facility] yourself directly at [phone] to schedule this. I am hopeful that we can get the ultrasound done prior to this weekend so that if we see the obvious cause of pain we can avoid the MRI.  Please keep me updated.

## 2019-11-29 ENCOUNTER — Other Ambulatory Visit: Payer: Medicare HMO

## 2019-12-03 DIAGNOSIS — R1013 Epigastric pain: Secondary | ICD-10-CM | POA: Diagnosis not present

## 2019-12-03 DIAGNOSIS — K219 Gastro-esophageal reflux disease without esophagitis: Secondary | ICD-10-CM | POA: Diagnosis not present

## 2019-12-04 ENCOUNTER — Ambulatory Visit
Admission: RE | Admit: 2019-12-04 | Discharge: 2019-12-04 | Disposition: A | Discharge status: Home / Self Care | Payer: Medicare HMO | Source: Ambulatory Visit | Attending: Family Medicine | Admitting: Family Medicine

## 2019-12-04 DIAGNOSIS — R1013 Epigastric pain: Secondary | ICD-10-CM

## 2019-12-05 NOTE — Progress Notes (Signed)
Abdomen ultrasound looks pretty normal to radiology.  Proceed with thoracic spine MRI.

## 2019-12-09 ENCOUNTER — Encounter: Payer: Self-pay | Admitting: Cardiovascular Disease

## 2019-12-09 DIAGNOSIS — R69 Illness, unspecified: Secondary | ICD-10-CM | POA: Diagnosis not present

## 2019-12-10 DIAGNOSIS — R1013 Epigastric pain: Secondary | ICD-10-CM | POA: Diagnosis not present

## 2019-12-10 DIAGNOSIS — R195 Other fecal abnormalities: Secondary | ICD-10-CM | POA: Diagnosis not present

## 2019-12-10 DIAGNOSIS — K219 Gastro-esophageal reflux disease without esophagitis: Secondary | ICD-10-CM | POA: Diagnosis not present

## 2019-12-10 LAB — CBC AND DIFFERENTIAL
Hemoglobin: 15.6 (ref 13.5–17.5)
Platelets: 313 (ref 150–399)
WBC: 8.8

## 2019-12-11 DIAGNOSIS — R1013 Epigastric pain: Secondary | ICD-10-CM | POA: Diagnosis not present

## 2019-12-11 DIAGNOSIS — R195 Other fecal abnormalities: Secondary | ICD-10-CM | POA: Diagnosis not present

## 2019-12-12 ENCOUNTER — Encounter: Payer: Self-pay | Admitting: Family Medicine

## 2019-12-13 ENCOUNTER — Other Ambulatory Visit: Payer: Medicare HMO

## 2019-12-14 ENCOUNTER — Ambulatory Visit
Admission: RE | Admit: 2019-12-14 | Discharge: 2019-12-14 | Disposition: A | Discharge status: Home / Self Care | Payer: Medicare HMO | Source: Ambulatory Visit | Attending: Family Medicine | Admitting: Family Medicine

## 2019-12-14 ENCOUNTER — Other Ambulatory Visit: Payer: Self-pay

## 2019-12-14 DIAGNOSIS — M5414 Radiculopathy, thoracic region: Secondary | ICD-10-CM

## 2019-12-14 DIAGNOSIS — G8929 Other chronic pain: Secondary | ICD-10-CM

## 2019-12-14 DIAGNOSIS — S22000D Wedge compression fracture of unspecified thoracic vertebra, subsequent encounter for fracture with routine healing: Secondary | ICD-10-CM

## 2019-12-14 DIAGNOSIS — M5124 Other intervertebral disc displacement, thoracic region: Secondary | ICD-10-CM | POA: Diagnosis not present

## 2019-12-15 ENCOUNTER — Other Ambulatory Visit: Payer: Medicare HMO | Admitting: *Deleted

## 2019-12-15 DIAGNOSIS — I2510 Atherosclerotic heart disease of native coronary artery without angina pectoris: Secondary | ICD-10-CM

## 2019-12-15 LAB — LIPID PANEL
Chol/HDL Ratio: 2.5 ratio (ref 0.0–5.0)
Cholesterol, Total: 157 mg/dL (ref 100–199)
HDL: 62 mg/dL (ref >39)
LDL Chol Calc (NIH): 64 mg/dL (ref 0–99)
Triglycerides: 188 mg/dL — ABNORMAL HIGH (ref 0–149)
VLDL Cholesterol Cal: 31 mg/dL (ref 5–40)

## 2019-12-15 NOTE — Progress Notes (Signed)
MRI shows an old compression fracture at T9-T11 and shows a disc bulging at T3-T4 that does not look like it is impinging the spinal cord.  We will discussed the results in full detail at scheduled follow-up visit on May 26.

## 2019-12-17 ENCOUNTER — Other Ambulatory Visit: Payer: Self-pay

## 2019-12-17 ENCOUNTER — Encounter: Payer: Self-pay | Admitting: Family Medicine

## 2019-12-17 ENCOUNTER — Ambulatory Visit: Payer: Medicare HMO | Admitting: Family Medicine

## 2019-12-17 VITALS — BP 126/88 | HR 68 | Ht 70.0 in | Wt 178.6 lb

## 2019-12-17 DIAGNOSIS — M546 Pain in thoracic spine: Secondary | ICD-10-CM

## 2019-12-17 DIAGNOSIS — G8929 Other chronic pain: Secondary | ICD-10-CM

## 2019-12-17 NOTE — Progress Notes (Signed)
   I, Jared Roberts, LAT, ATC, am serving as scribe for Dr. Lynne Roberts.  Jared Roberts is a 74 y.o. male who presents to [facility] today for f/u of chronic mid/low back pain.  He was last seen by Dr. Georgina Roberts on 11/21/19 for a flare-up of back pain after loading/unloading things into/out of his trunk.  He was advised to con't PT and was referred for a T-spine MRI.  He was taking Flexeril and Gabapentin.  Since his last visit, pt reports that his back is feeling slightly improved and believes that he is ready to resume PT.  He locates the majority of his pain to his lower t-spine w/ occasional LBP.  At the same time is been having abdominal pain that is in the process of being worked up.  Ultrasound abdomen was nondiagnostic.  He has a follow-up appointment scheduled with gastroenterology in July via PCP.  Diagnostic testing: T-spine MRI- 12/14/19  Pertinent review of systems: No fevers or chills  Relevant historical information: History of heart disease   Exam:  BP 126/88 (BP Location: Right Arm, Patient Position: Sitting, Cuff Size: Normal)   Pulse 68   Ht 5\' 10"  (1.778 m)   Wt 178 lb 9.6 oz (81 kg)   SpO2 97%   BMI 25.63 kg/m  General: Well Developed, well nourished, and in no acute distress.   MSK: T-spine nontender normal spine motion.  Normal gait.    Lab and Radiology Results MR THORACIC SPINE WO CONTRAST  Result Date: 12/15/2019 CLINICAL DATA:  Mid back pain with suspected compression fracture. EXAM: MRI THORACIC SPINE WITHOUT CONTRAST TECHNIQUE: Multiplanar, multisequence MR imaging of the thoracic spine was performed. No intravenous contrast was administered. COMPARISON:  None. FINDINGS: Alignment: Exaggerated thoracic kyphosis. On the counting sequence there is degenerative anterolisthesis at C4-5 and C5-6. Vertebrae: No acute fracture, evidence of discitis, or bone lesion. Remote superior endplate fractures at T9, T10, and T11, without marrow  edema. Height loss at these levels is mild. Cord:  Normal signal and morphology. Paraspinal and other soft tissues: No evidence of inflammation or mass Disc levels: T3-4 central disc protrusion without cord compression. Mild upper thoracic degenerative facet spurring IMPRESSION: 1. Remote T9-T11 superior endplate fractures.  No acute fracture. 2. T3-4 noncompressive disc protrusion. Electronically Signed   By: Monte Fantasia M.D.   On: 12/15/2019 07:45   I, Jared Roberts, personally (independently) visualized and performed the interpretation of the images attached in this note.     Assessment and Plan: 74 y.o. male with thoracic back pain.  Etiology somewhat unclear likely myofascial strain and spasm.  Reasonable to proceed with physical therapy again.  Additionally patient should continue proceed with work-up for his abdominal pain which may be related.  Follow-up gastroenterology as scheduled.  If needed in the interim neck step would probably be CT scan abdomen pelvis with IV and oral contrast.     Discussed warning signs or symptoms. Please see discharge instructions. Patient expresses understanding.   The above documentation has been reviewed and is accurate and complete Jared Roberts, M.D.

## 2019-12-17 NOTE — Patient Instructions (Addendum)
Thank you for coming in today. I think we can proceed to PT.  Recheck with me as needed.  Would recommend CT scan of the abdomen with IV and Oral contrast as next test if needed in the future.

## 2019-12-24 ENCOUNTER — Ambulatory Visit: Payer: Medicare HMO | Admitting: Cardiovascular Disease

## 2019-12-24 ENCOUNTER — Encounter: Payer: Self-pay | Admitting: Cardiovascular Disease

## 2019-12-24 ENCOUNTER — Other Ambulatory Visit: Payer: Self-pay

## 2019-12-24 VITALS — BP 114/76 | HR 68 | Ht 70.0 in | Wt 179.2 lb

## 2019-12-24 DIAGNOSIS — I10 Essential (primary) hypertension: Secondary | ICD-10-CM | POA: Diagnosis not present

## 2019-12-24 DIAGNOSIS — I2510 Atherosclerotic heart disease of native coronary artery without angina pectoris: Secondary | ICD-10-CM | POA: Diagnosis not present

## 2019-12-24 NOTE — Patient Instructions (Signed)
Medication Instructions:  Your physician recommends that you continue on your current medications as directed. Please refer to the Current Medication list given to you today.  *If you need a refill on your cardiac medications before your next appointment, please call your pharmacy*   Lab Work: NONE If you have labs (blood work) drawn today and your tests are completely normal, you will receive your results only by: Marland Kitchen MyChart Message (if you have MyChart) OR . A paper copy in the mail If you have any lab test that is abnormal or we need to change your treatment, we will call you to review the results.   Testing/Procedures: NONE   Follow-Up: At [facility], you and your health needs are our priority.  As part of our continuing mission to provide you with exceptional heart care, we have created designated Provider Care Teams.  These Care Teams include your primary Cardiologist (physician) and Advanced Practice Providers (APPs -  Physician Assistants and Nurse Practitioners) who all work together to provide you with the care you need, when you need it.  We recommend signing up for the patient portal called "MyChart".  Sign up information is provided on this After Visit Summary.  MyChart is used to connect with patients for Virtual Visits (Telemedicine).  Patients are able to view lab/test results, encounter notes, upcoming appointments, etc.  Non-urgent messages can be sent to your provider as well.   To learn more about what you can do with MyChart, go to [email or web address].    Your next appointment:   6 month(s)  The format for your next appointment:   In Person  Provider:   You may see Mertie Moores, MD  or one of the following Advanced Practice Providers on your designated Care Team:    Richardson Dopp, PA-C  Robbie Lis, [place]    Other Instructions NONE

## 2019-12-24 NOTE — Progress Notes (Signed)
Cardiology Office Note:    Date:  12/24/2019   ID:  Jared Roberts, DOB September 08, 1945, MRN [id number]  PCP:  Deland Pretty, MD  Cardiologist:  Mylani Gentry  Electrophysiologist:  None   Referring MD: Deland Pretty, MD   Chief Complaint  Patient presents with  . Coronary Artery Disease  . Hyperlipidemia    January 31, 2019    Jared Roberts is a 74 y.o. male with a hx of  HTN, TIA and carotid stenosis .  Has been having some chest pain  A week ago, mowed the lawn,  Had CP and profound fatigue. Upper left chest ,  Left shoulder and arm,  Left jaw.   Comes and goes ,  Last for seconds - minutes ,   Resolves if he stops to rest.  Associated with dyspnea but also has dyspnea on its own.  Doesn't exercise,  Works in the yard . Can climb 2 flights of stairs - makes him breath hard.   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  Non smoker, Drinks 1 drink per night   March 26, 2019:  Jared Roberts is seen back today for a follow-up office visit.  He recently had a heart catheterization which revealed severe coronary artery disease and he had coronary artery bypass grafting. BP has been variable Lower in the am Higher in the PM Cannot tell when his BP is high or low Chest is sore but no angina  Exercising some - walking around the house .   Has a referral for cardiac rehab  May 08, 2019: Jared Roberts is seen back today for follow-up of his coronary artery disease.  He status post coronary artery bypass grafting.  He had early graft failure and had a PCI of his left circumflex artery. He presented several weeks later with angina  and was found restenosis of his LCx.   He had stenting of his LCX and is now seen for follow up    Still is not feeling well Is really tired.  He is 8 weeks out from his CABG  1 month out from graft closure and PCI 2 weeks out from 2nd PCI .   No angina .   Has chest wall pain   Brought Pressure log with him.  His evening blood pressures were  running a little high.  We recently increased his Benicar up to 20 mg twice a day.  Has back pain since his cardiac arrest on Sept. 14.  Was given flexeril which helps but makes him sleepy .  Has developed double vision starting a week ago.   Has Right cranial nerve IV palsy .    Has continued back pain   May 09, 2019: Was seen yesterday late morning Left the office, went to [employer] very poorly  Took a nap.  Woke up,  HR was 120  Took a SL NTG .   Measured BP which was low  Took metoprolol 50 mg ,   Eventually felt better.  Review of labs from yesterday suggest volume depletion Hb is normal ECG is normal sinus rhythm  today  Will give him propranolol to take as needed.   June 26, 2019:  Overall doing well Rare episodes of CP  Woke up with GERD 2 nights ago. Took Tums  GERD symptoms returned.  Took NTG and pains resolved. Quickly  Has a hx of heartburn ,  This resolved after the CABG.  September 24, 2019: Jared Roberts  is seen back for follow-up visit. Thinks he is losing ground  Is not able to walk slowly No angina Has twinges.   No cp .   Is fatigued.  Does not have the energy to walk much or walk quickly  CABG was Aug. 19.  tnen had MI on Sept 14 and sept 28   Still having back pain which radiates around to his stomach   We discussed his lipid levels.  His triglyceride level had gone from 187 up to 285.  His LDL cholesterol was 57.  It turns out he had had a large fried seafood platter the week before his blood draw and also ate pizza day or so before his blood draw.  It turns out that he is tried rosuvastatin in the past but developed severe aches with that.  We tried him on atorvastatin and he developed severe muscle aches with that.  He is gradually worked his way down to 10 mg which he seems to be tolerating fairly well but it is unclear that that is going to provide any benefit from a cardiovascular risk standpoint.  We will refer him to the lipid clinic for  consideration for PCSK9 inhibitors.  December 24, 2019:  Jared Roberts is seen today for follow-up of his coronary artery disease.  He has had coronary artery bypass grafting.  He then has had 2 subsequent PCI's.  Had a stomach ache for a month.   Does not know the etiology  No cp , no dyspnea  Exercising a little.   Going for back PT I reviewed his recent labs - LDL is 64. Trigs are slighlty elevated at 157.   Past Medical History:  Diagnosis Date  . Coronary artery disease   . DDD (degenerative disc disease), cervical   . Depression   . ED (erectile dysfunction) of organic origin   . Generalized osteoarthritis   . GERD (gastroesophageal reflux disease)   . Hiatal hernia   . History of chronic bronchitis   . History of gastritis    2003  . History of Helicobacter pylori infection    2003  . History of melanoma excision    2015--  right ear  . History of prostate cancer urologist-  dr [name]   2007--  pT2a Nx Mx,  Gleason 3+3, S/P  PROSTATECTOMY  . History of squamous cell carcinoma excision    2011  left knee  . Hyperlipidemia   . Hyperplastic colon polyp 2009  . Hypertension   . Male hypogonadism   . Melanoma ( [place])    left 4th toe  . Nephrolithiasis   . Prostate cancer ([place])   . Ventricular fibrillation ([place]) 04/07/2019   VF, en route witnessed by EMS, defibrillated x 1    Past Surgical History:  Procedure Laterality Date  . AMPUTATION TOE Left 09/07/2015   Procedure: PARTIAL AMPUTATION TOE 4TH LEFT;  Surgeon: [name], MD;  Location: [facility];  Service: Podiatry;  Laterality: Left;  . APPENDECTOMY  1970's  . CORONARY ARTERY BYPASS GRAFT N/A 03/12/2019   Procedure: CORONARY ARTERY BYPASS GRAFTING (CABG) x 3 WITH ENDOSCOPIC HARVESTING OF RIGHT GREATER SAPHENOUS VEIN;  Surgeon: [name], MD;  Location: [facility];  Service: Open Heart Surgery;  Laterality: N/A;  . CORONARY BALLOON ANGIOPLASTY N/A 04/07/2019   Procedure: CORONARY BALLOON ANGIOPLASTY;   Surgeon: [name], MD;  Location: [facility];  Service: Cardiovascular;  Laterality: N/A;  . CORONARY STENT INTERVENTION N/A 04/21/2019  Procedure: CORONARY STENT INTERVENTION;  Surgeon: [name], MD;  Location: [facility];  Service: Cardiovascular;  Laterality: N/A;  . CYSTO/  CLOT EVACUATION POST PROSTATECTOMY  11-19-2005  . CYSTO/  REMOVAL URETHRAL STONE AND URETHRAL STAPLES  06-02-2010  . CYSTO/  URETEROSCOPIC STONE EXTRACTION  X2  1990's  . KIDNEY STONE SURGERY    . LEFT HEART CATH AND CORONARY ANGIOGRAPHY N/A 02/25/2019   Procedure: LEFT HEART CATH AND CORONARY ANGIOGRAPHY;  Surgeon: [name], MD;  Location: [facility];  Service: Cardiovascular;  Laterality: N/A;  . LEFT HEART CATH AND CORS/GRAFTS ANGIOGRAPHY N/A 04/07/2019   Procedure: LEFT HEART CATH AND CORS/GRAFTS ANGIOGRAPHY;  Surgeon: [name], MD;  Location: [facility];  Service: Cardiovascular;  Laterality: N/A;  . LEFT HEART CATH AND CORS/GRAFTS ANGIOGRAPHY N/A 04/21/2019   Procedure: LEFT HEART CATH AND CORS/GRAFTS ANGIOGRAPHY;  Surgeon: [name], MD;  Location: [facility];  Service: Cardiovascular;  Laterality: N/A;  . MELANOMA EXCISION  2015   right ear  . ROBOT ASSISTED LAPAROSCOPIC RADICAL PROSTATECTOMY  11-09-2005  . SKIN CANCER EXCISION    . TEE WITHOUT CARDIOVERSION N/A 03/12/2019   Procedure: TRANSESOPHAGEAL ECHOCARDIOGRAM (TEE);  Surgeon: [name], MD;  Location: [facility];  Service: Open Heart Surgery;  Laterality: N/A;  . TONSILLECTOMY  child    Current Medications: Current Meds  Medication Sig  . ascorbic acid (VITAMIN C) 1000 MG tablet Take 1,000 mg by mouth daily.  Marland Kitchen aspirin EC 81 MG EC tablet Take 1 tablet (81 mg total) by mouth daily.  Marland Kitchen atorvastatin (LIPITOR) 20 MG tablet Take 1 tablet (20 mg total) by mouth daily.  . calcium carbonate (TUMS - DOSED IN MG ELEMENTAL CALCIUM) 500 MG chewable tablet Chew 2 tablets  by mouth as needed for indigestion or heartburn.   . Cholecalciferol (VITAMIN D) 2000 units CAPS Take 2,000 Units by mouth 2 (two) times a day.   Marland Kitchen FAMOTIDINE PO Take 10 mg by mouth as needed.  . hydrochlorothiazide (HYDRODIURIL) 25 MG tablet Take 1 tablet (25 mg total) by mouth daily.  Marland Kitchen ibuprofen (ADVIL) 400 MG tablet Take 400 mg by mouth as needed for moderate pain.  . isosorbide mononitrate (IMDUR) 30 MG 24 hr tablet Take 30 mg by mouth daily. Half a tablet (15mg )  . losartan (COZAAR) 100 MG tablet Take 1 tablet (100 mg total) by mouth daily.  Marland Kitchen MAGNESIUM CITRATE PO Take 250 mg by mouth daily.  . metoprolol tartrate (LOPRESSOR) 50 MG tablet Take 1 tablet (50 mg total) by mouth 3 (three) times daily.  . Multiple Vitamin (MULTIVITAMIN WITH MINERALS) TABS tablet Take 1 tablet by mouth daily.  . nitroGLYCERIN (NITROSTAT) 0.4 MG SL tablet Place 1 tablet (0.4 mg total) under the tongue every 5 (five) minutes x 3 doses as needed for chest pain.  . pantoprazole (PROTONIX) 20 MG tablet Take 20 mg by mouth every other day.  . potassium chloride (KLOR-CON) 10 MEQ tablet Take 1 tablet (10 mEq total) by mouth daily.  . Probiotic Product (PROBIOTIC DAILY PO) Take 1 capsule by mouth daily.   . ticagrelor (BRILINTA) 90 MG TABS tablet Take 1 tablet (90 mg total) by mouth 2 (two) times daily.  . [DISCONTINUED] cyclobenzaprine (FLEXERIL) 10 MG tablet Take 1 tablet (10 mg total) by mouth 3 (three) times daily as needed for muscle spasms.  . [DISCONTINUED] gabapentin (NEURONTIN) 300 MG capsule Take 1 capsule (300 mg total) by  mouth 3 (three) times daily as needed (nerve pain). Take mostly at night for nerve pain  . [DISCONTINUED] propranolol (INDERAL) 10 MG tablet Take 1 tablet (10 mg total) by mouth 4 (four) times daily as needed (palpitations).  . [DISCONTINUED] traMADol (ULTRAM) 50 MG tablet Take 1 tablet (50 mg total) by mouth every 4 (four) hours as needed for moderate pain.     Allergies:   Patient has no  known allergies.   Social History   Socioeconomic History  . Marital status: Married    Spouse name: [name]  . Number of children: 1  . Years of education: 50  . Highest education level: Master's degree (e.g., MA, MS, MEng, MEd, MSW, MBA)  Occupational History  . Occupation: retired    Comment: from Manufacturing engineer  Tobacco Use  . Smoking status: Never Smoker  . Smokeless tobacco: Never Used  Substance and Sexual Activity  . Alcohol use: Yes    Comment: 1 glass of wine daily  . Drug use: No  . Sexual activity: Not on file    Comment: retired, married. 1 son  Other Topics Concern  . Not on file  Social History Narrative  . Not on file   Social Determinants of Health   Financial Resource Strain: Low Risk   . Difficulty of Paying Living Expenses: Not hard at all  Food Insecurity: No Food Insecurity  . Worried About Charity fundraiser in the Last Year: Never true  . Ran Out of Food in the Last Year: Never true  Transportation Needs: No Transportation Needs  . Lack of Transportation (Medical): No  . Lack of Transportation (Non-Medical): No  Physical Activity: Inactive  . Days of Exercise per Week: 0 days  . Minutes of Exercise per Session: 0 min  Stress: No Stress Concern Present  . Feeling of Stress : Not at all  Social Connections: Slightly Isolated  . Frequency of Communication with Friends and Family: More than three times a week  . Frequency of Social Gatherings with Friends and Family: Once a week  . Attends Religious Services: 1 to 4 times per year  . Active Member of Clubs or Organizations: No  . Attends Archivist Meetings: Never  . Marital Status: Married     Family History: The patient's family history includes Arthritis in his mother; Congestive Heart Failure in his mother; Diabetes Mellitus II in his mother; Osteoarthritis in his mother; Other in his father.  ROS:   Please see the history of present illness.     All other systems reviewed and  are negative.  EKGs/Labs/Other Studies Reviewed:    The following studies were reviewed today:   EKG:     Recent Labs: 04/07/2019: Magnesium 2.2 09/22/2019: ALT 18; BUN 10; Creatinine, Ser 1.33; Potassium 4.0; Sodium 137 12/10/2019: Hemoglobin 15.6; Platelets 313  Recent Lipid Panel    Component Value Date/Time   CHOL 157 12/15/2019 0941   TRIG 188 (H) 12/15/2019 0941   HDL 62 12/15/2019 0941   CHOLHDL 2.5 12/15/2019 0941   CHOLHDL 3.4 04/08/2019 0300   VLDL 37 04/08/2019 0300   LDLCALC 64 12/15/2019 0941    Physical Exam:     Physical Exam: Blood pressure 114/76, pulse 68, height 5\' 10"  (1.778 m), weight 179 lb 4 oz (81.3 kg), SpO2 96 %.  GEN:  Well nourished, well developed in no acute distress HEENT: Normal NECK: No JVD; No carotid bruits LYMPHATICS: No lymphadenopathy CARDIAC: RRR , no murmurs, rubs,  gallops RESPIRATORY:  Clear to auscultation without rales, wheezing or rhonchi  ABDOMEN: Soft, non-tender, non-distended MUSCULOSKELETAL:  No edema; No deformity  SKIN: Warm and dry NEUROLOGIC:  Alert and oriented x 3     ASSESSMENT:    No diagnosis found. PLAN:    In order of problems listed above:  CAD :   6 months okayHe is not having any episodes of angina.  Continue with current medications. Echocardiogram from March, 2021 reveals normal left ventricular systolic function.  He is got grade 1 diastolic dysfunction, mild mitral vegetation.   2.  HTN: Blood pressure is well controlled.  3.  Hyperlipidemia:    Recent lipids look good.  Continue current medications.  4.   Back pain   .  He has chronic back pain.  He supposed to be starting physical therapy soon.  Medication Adjustments/Labs and Tests Ordered: Current medicines are reviewed at length with the patient today.  Concerns regarding medicines are outlined above.  No orders of the defined types were placed in this encounter.  No orders of the defined types were placed in this  encounter.    Patient Instructions   Medication Instructions:  Your physician recommends that you continue on your current medications as directed. Please refer to the Current Medication list given to you today.  *If you need a refill on your cardiac medications before your next appointment, please call your pharmacy*   Lab Work: NONE If you have labs (blood work) drawn today and your tests are completely normal, you will receive your results only by: Marland Kitchen MyChart Message (if you have MyChart) OR . A paper copy in the mail If you have any lab test that is abnormal or we need to change your treatment, we will call you to review the results.   Testing/Procedures: NONE   Follow-Up: At [facility], you and your health needs are our priority.  As part of our continuing mission to provide you with exceptional heart care, we have created designated Provider Care Teams.  These Care Teams include your primary Cardiologist (physician) and Advanced Practice Providers (APPs -  Physician Assistants and Nurse Practitioners) who all work together to provide you with the care you need, when you need it.  We recommend signing up for the patient portal called "MyChart".  Sign up information is provided on this After Visit Summary.  MyChart is used to connect with patients for Virtual Visits (Telemedicine).  Patients are able to view lab/test results, encounter notes, upcoming appointments, etc.  Non-urgent messages can be sent to your provider as well.   To learn more about what you can do with MyChart, go to [email or web address].    Your next appointment:   6 month(s)  The format for your next appointment:   In Person  Provider:   You may see Mertie Moores, MD  or one of the following Advanced Practice Providers on your designated Care Team:    Richardson Dopp, PA-C  Robbie Lis, [place]    Other Instructions NONE    Signed, Mertie Moores, MD  12/24/2019 2:45 PM    [place]

## 2019-12-30 ENCOUNTER — Ambulatory Visit: Payer: Medicare HMO | Admitting: Cardiovascular Disease

## 2019-12-31 ENCOUNTER — Ambulatory Visit: Payer: Medicare HMO | Admitting: Physical Therapy

## 2019-12-31 ENCOUNTER — Encounter: Payer: Self-pay | Admitting: Physical Therapy

## 2019-12-31 ENCOUNTER — Other Ambulatory Visit: Payer: Self-pay

## 2019-12-31 DIAGNOSIS — M545 Low back pain: Secondary | ICD-10-CM

## 2019-12-31 DIAGNOSIS — M546 Pain in thoracic spine: Secondary | ICD-10-CM | POA: Diagnosis not present

## 2019-12-31 DIAGNOSIS — G8929 Other chronic pain: Secondary | ICD-10-CM | POA: Diagnosis not present

## 2019-12-31 NOTE — Therapy (Signed)
[facility] [address] [place], [place], [address] Phone: [phone]   Fax:  [phone]  Physical Therapy Treatment/Re-Cert   Patient Details  Name: Jared Roberts MRN: [id number] Date of Birth: 08/09/1945 Referring Provider (PT): Lynne Leader   Encounter Date: 12/31/2019  PT End of Session - 12/31/19 1228    Visit Number  2    Number of Visits  12    Date for PT Re-Evaluation  02/11/20    Authorization Type  [employer] Medicare    PT Start Time  1221    PT Stop Time  1257    PT Time Calculation (min)  36 min    Activity Tolerance  Patient tolerated treatment well    Behavior During Therapy  [facility] for tasks assessed/performed       Past Medical History:  Diagnosis Date  . Coronary artery disease   . DDD (degenerative disc disease), cervical   . Depression   . ED (erectile dysfunction) of organic origin   . Generalized osteoarthritis   . GERD (gastroesophageal reflux disease)   . Hiatal hernia   . History of chronic bronchitis   . History of gastritis    2003  . History of Helicobacter pylori infection    2003  . History of melanoma excision    2015--  right ear  . History of prostate cancer urologist-  dr [name]   2007--  pT2a Nx Mx,  Gleason 3+3, S/P  PROSTATECTOMY  . History of squamous cell carcinoma excision    2011  left knee  . Hyperlipidemia   . Hyperplastic colon polyp 2009  . Hypertension   . Male hypogonadism   . Melanoma ([place])    left 4th toe  . Nephrolithiasis   . Prostate cancer ([place])   . Ventricular fibrillation ([place]) 04/07/2019   VF, en route witnessed by EMS, defibrillated x 1    Past Surgical History:  Procedure Laterality Date  . AMPUTATION TOE Left 09/07/2015   Procedure: PARTIAL AMPUTATION TOE 4TH LEFT;  Surgeon: [name], MD;  Location: [place];  Service: Podiatry;  Laterality: Left;  . APPENDECTOMY  1970's  . CORONARY ARTERY BYPASS GRAFT N/A 03/12/2019   Procedure: CORONARY  ARTERY BYPASS GRAFTING (CABG) x 3 WITH ENDOSCOPIC HARVESTING OF RIGHT GREATER SAPHENOUS VEIN;  Surgeon: [name], MD;  Location: [place];  Service: Open Heart Surgery;  Laterality: N/A;  . CORONARY BALLOON ANGIOPLASTY N/A 04/07/2019   Procedure: CORONARY BALLOON ANGIOPLASTY;  Surgeon: [name], MD;  Location: [place] [facility];  Service: Cardiovascular;  Laterality: N/A;  . CORONARY STENT INTERVENTION N/A 04/21/2019   Procedure: CORONARY STENT INTERVENTION;  Surgeon: [name], MD;  Location: [place] [facility];  Service: Cardiovascular;  Laterality: N/A;  . CYSTO/  CLOT EVACUATION POST PROSTATECTOMY  11-19-2005  . CYSTO/  REMOVAL URETHRAL STONE AND URETHRAL STAPLES  06-02-2010  . CYSTO/  URETEROSCOPIC STONE EXTRACTION  X2  1990's  . KIDNEY STONE SURGERY    . LEFT HEART CATH AND CORONARY ANGIOGRAPHY N/A 02/25/2019   Procedure: LEFT HEART CATH AND CORONARY ANGIOGRAPHY;  Surgeon: [name], MD;  Location: [place] [facility];  Service: Cardiovascular;  Laterality: N/A;  . LEFT HEART CATH AND CORS/GRAFTS ANGIOGRAPHY N/A 04/07/2019   Procedure: LEFT HEART CATH AND CORS/GRAFTS ANGIOGRAPHY;  Surgeon: [name], MD;  Location: [place] [facility];  Service: Cardiovascular;  Laterality: N/A;  . LEFT HEART CATH AND CORS/GRAFTS ANGIOGRAPHY N/A  04/21/2019   Procedure: LEFT HEART CATH AND CORS/GRAFTS ANGIOGRAPHY;  Surgeon: [name], MD;  Location: [place] [facility];  Service: Cardiovascular;  Laterality: N/A;  . MELANOMA EXCISION  2015   right ear  . ROBOT ASSISTED LAPAROSCOPIC RADICAL PROSTATECTOMY  11-09-2005  . SKIN CANCER EXCISION    . TEE WITHOUT CARDIOVERSION N/A 03/12/2019   Procedure: TRANSESOPHAGEAL ECHOCARDIOGRAM (TEE);  Surgeon: [name], MD;  Location: [place];  Service: Open Heart Surgery;  Laterality: N/A;  . TONSILLECTOMY  child    There were no vitals filed for this visit.  Subjective Assessment - 12/31/19 1223     Subjective  Pt has had several of test for thoracic and abdominal pain.  Minimal abdominal pain at this time, but is seeing Gertie Fey in July. Mid and lower back still painful. R>L   . L hip/Thigh weakness, soreness, noted after long period of walking, Not exercsing at this time. retired, does a lot of bending and work in yard.    Patient Stated Goals  decreased pain    Currently in Pain?  Yes    Pain Score  4     Pain Location  Back    Pain Orientation  Right;Left    Pain Descriptors / Indicators  Aching    Pain Type  Chronic pain    Pain Radiating Towards  R>L     L hip/Thigh weakness, soreness, noted after long period of walking,    Pain Onset  More than a month ago    Pain Frequency  Intermittent    Aggravating Factors   Sudden movements, bending,         [facility] PT Assessment - 12/31/19 0001      Posture/Postural Control   Posture Comments  poor seated posture, thoracic kyphosis, rounded shoulders, PPT.       AROM   Overall AROM Comments  Hips: WFL,     Lumbar Flexion  mod limitation    Lumbar Extension  mild limitation    Lumbar - Right Side Bend  mild limitation/pain    Lumbar - Left Side Bend  mild limitation      Strength   Overall Strength Comments  hips: 4+/5, knee: 5/5       Palpation   Palpation comment  tenderness/pain in central/mid thoracic spine with light PAs, mild tightness in thoracic paraspinals;  Pain in R SI, minimal pain in glute, mild tightness in bil lumbar paraspinals.       Special Tests   Other special tests  Neg SLR.                     [facility] Adult PT Treatment/Exercise - 12/31/19 0001      Lumbar Exercises: Stretches   Single Knee to Chest Stretch  2 reps;30 seconds    Pelvic Tilt  20 reps    Other Lumbar Stretch Exercise  scap squeeze x 15;     Other Lumbar Stretch Exercise  Supine pec stretch 30 sec x 3; Standing QL stretch 30 sec x 2 bil;              PT Education - 12/31/19 1302    Education Details  PT POC, HEP     Person(s) Educated  Patient    Methods  Explanation;Demonstration;Verbal cues;Handout    Comprehension  Verbalized understanding;Returned demonstration;Verbal cues required;Need further instruction       PT Short Term Goals - 12/31/19 1303  PT SHORT TERM GOAL #1   Title  PT to be independent with initial HEP    Time  2    Period  Weeks    Status  New    Target Date  01/14/20        PT Long Term Goals - 12/31/19 1305      PT LONG TERM GOAL #1   Title  Pt to be independent with final HEP for back    Time  6    Period  Weeks    Status  New    Target Date  02/11/20      PT LONG TERM GOAL #2   Title  Pt to demo improved ROM for thoracic and lumbar spine, to be [facility], and pain free, to improve ability for ADLs and IADLs.    Time  6    Period  Weeks    Status  New    Target Date  02/11/20      PT LONG TERM GOAL #3   Title  Pt to demo proper lift, bend, squat technique, as well as optimal seated posture, for improving pain with activity    Time  6    Period  Weeks    Status  New    Target Date  02/11/20      PT LONG TERM GOAL #4   Title  Pt to report decreased pain in thoracic and lumbar spine to be 0-2/10 with activity    Time  6    Period  Weeks    Status  New    Target Date  02/11/20            Plan - 12/31/19 1310    Clinical Impression Statement  Pt returns to PT today, with improved abdominal pain, appropriate for  PT at this time. He has most pain in mid thoracic region, and R low lumbar region. He has mild ROM limitations, and decreased strenght for core and hips. Pt with poor seated posture, and poor posture with bending and functional activity. Pt with decreased ability for full functional activities, due to pain, and will benefit from PT to improve. Goals updated today for back pain.    Examination-Activity Limitations  Locomotion Level;Bend;Lift;Stand;Squat;Carry;Sleep;Sit    Examination-Participation Restrictions  Meal  Prep;Cleaning;Driving;Shop;Laundry;Yard Work    Stability/Clinical Decision Making  Stable/Uncomplicated    Clinical Decision Making  Low    Rehab Potential  Good    PT Frequency  2x / week    PT Duration  6 weeks    PT Treatment/Interventions  ADLs/Self Care Home Management;Cryotherapy;Electrical Stimulation;Gait training;Ultrasound;Traction;Iontophoresis 4mg /ml Dexamethasone;Moist Heat;Functional mobility training;Therapeutic activities;Therapeutic exercise;Neuromuscular re-education;Patient/family education;Manual techniques;Dry needling;Passive range of motion;Scar mobilization;Vasopneumatic Device;Spinal Manipulations    PT Home Exercise Plan  [email or web address]    Consulted and Agree with Plan of Care  Patient       Patient will benefit from skilled therapeutic intervention in order to improve the following deficits and impairments:  Decreased range of motion, Increased muscle spasms, Decreased activity tolerance, Pain, Impaired flexibility, Improper body mechanics, Decreased mobility, Decreased strength  Visit Diagnosis: Pain in thoracic spine  Chronic bilateral low back pain without sciatica     Problem List Patient Active Problem List   Diagnosis Date Noted  . Hyperlipidemia 04/22/2019  . Unstable angina ([place]) 04/19/2019  . HTN (hypertension) 04/09/2019  . Non-ST elevation (NSTEMI) myocardial infarction ([place]) 04/07/2019  . Ventricular fibrillation ([place]) 04/07/2019  . NSTEMI (non-ST elevated myocardial infarction) ([place]) 04/07/2019  . S/P  CABG x 3 03/12/2019  . Coronary artery disease 03/12/2019  . Effort angina ([facility])   . Chest pain of uncertain etiology 96/28/3662  . Benign essential HTN 01/31/2019  . Mild obstructive sleep apnea 04/17/2018  . Postviral fatigue syndrome 03/27/2018  . Snoring 03/27/2018  . Excessive daytime sleepiness 03/27/2018  . Confusion 02/13/2018  . Malignant melanoma of toe of left foot ([place]) 09/20/2015  . History of prostate cancer 09/20/2015   Lyndee Hensen, PT, DPT 2:39 PM  12/31/19   [facility] [place], [place], [address] Phone: [phone]   Fax:  [phone]  Name: Jared Roberts MRN: [id number] Date of Birth: 17-Jul-1946

## 2019-12-31 NOTE — Patient Instructions (Signed)
Access Code: [email or web address] URL: [email or web address] Date: 12/31/2019 Prepared by: Lyndee Hensen  Exercises Supine Single Knee to Chest Stretch - 2 x daily - 3 reps - 30 hold Supine Posterior Pelvic Tilt - 2 x daily - 1-2 sets - 10 reps Seated Scapular Retraction - 2 x daily - 2 sets - 10 reps Supine Pectoralis Stretch - 2 x daily - 3 reps - 30 hold

## 2020-01-08 ENCOUNTER — Encounter: Payer: Self-pay | Admitting: Physical Therapy

## 2020-01-08 ENCOUNTER — Ambulatory Visit: Payer: Medicare HMO | Admitting: Physical Therapy

## 2020-01-08 ENCOUNTER — Other Ambulatory Visit: Payer: Self-pay

## 2020-01-08 DIAGNOSIS — G8929 Other chronic pain: Secondary | ICD-10-CM | POA: Diagnosis not present

## 2020-01-08 DIAGNOSIS — M546 Pain in thoracic spine: Secondary | ICD-10-CM | POA: Diagnosis not present

## 2020-01-08 DIAGNOSIS — M545 Low back pain, unspecified: Secondary | ICD-10-CM

## 2020-01-10 ENCOUNTER — Encounter: Payer: Self-pay | Admitting: Physical Therapy

## 2020-01-10 NOTE — Therapy (Signed)
[facility] [address] [place], [place], [address] Phone: [phone]   Fax:  [phone]  Physical Therapy Treatment  Patient Details  Name: Jared Roberts MRN: [id number] Date of Birth: 05-14-1946 Referring Provider (PT): Lynne Leader   Encounter Date: 01/08/2020   PT End of Session - 01/10/20 1605    Visit Number 3    Number of Visits 12    Date for PT Re-Evaluation 02/11/20    Authorization Type Aetna Medicare    PT Start Time 7824    PT Stop Time 1600    PT Time Calculation (min) 43 min    Activity Tolerance Patient tolerated treatment well    Behavior During Therapy [facility] for tasks assessed/performed           Past Medical History:  Diagnosis Date  . Coronary artery disease   . DDD (degenerative disc disease), cervical   . Depression   . ED (erectile dysfunction) of organic origin   . Generalized osteoarthritis   . GERD (gastroesophageal reflux disease)   . Hiatal hernia   . History of chronic bronchitis   . History of gastritis    2003  . History of Helicobacter pylori infection    2003  . History of melanoma excision    2015--  right ear  . History of prostate cancer urologist-  dr [name]   2007--  pT2a Nx Mx,  Gleason 3+3, S/P  PROSTATECTOMY  . History of squamous cell carcinoma excision    2011  left knee  . Hyperlipidemia   . Hyperplastic colon polyp 2009  . Hypertension   . Male hypogonadism   . Melanoma ([place])    left 4th toe  . Nephrolithiasis   . Prostate cancer ([place])   . Ventricular fibrillation ([place]) 04/07/2019   VF, en route witnessed by EMS, defibrillated x 1    Past Surgical History:  Procedure Laterality Date  . AMPUTATION TOE Left 09/07/2015   Procedure: PARTIAL AMPUTATION TOE 4TH LEFT;  Surgeon: [name], MD;  Location: [place];  Service: Podiatry;  Laterality: Left;  . APPENDECTOMY  1970's  . CORONARY ARTERY BYPASS GRAFT N/A 03/12/2019   Procedure: CORONARY ARTERY  BYPASS GRAFTING (CABG) x 3 WITH ENDOSCOPIC HARVESTING OF RIGHT GREATER SAPHENOUS VEIN;  Surgeon: [name], MD;  Location: [place] [place];  Service: Open Heart Surgery;  Laterality: N/A;  . CORONARY BALLOON ANGIOPLASTY N/A 04/07/2019   Procedure: CORONARY BALLOON ANGIOPLASTY;  Surgeon: [name], MD;  Location: [place] [facility];  Service: Cardiovascular;  Laterality: N/A;  . CORONARY STENT INTERVENTION N/A 04/21/2019   Procedure: CORONARY STENT INTERVENTION;  Surgeon: [name], MD;  Location: [place] [facility];  Service: Cardiovascular;  Laterality: N/A;  . CYSTO/  CLOT EVACUATION POST PROSTATECTOMY  11-19-2005  . CYSTO/  REMOVAL URETHRAL STONE AND URETHRAL STAPLES  06-02-2010  . CYSTO/  URETEROSCOPIC STONE EXTRACTION  X2  1990's  . KIDNEY STONE SURGERY    . LEFT HEART CATH AND CORONARY ANGIOGRAPHY N/A 02/25/2019   Procedure: LEFT HEART CATH AND CORONARY ANGIOGRAPHY;  Surgeon: [name], MD;  Location: [place] [facility];  Service: Cardiovascular;  Laterality: N/A;  . LEFT HEART CATH AND CORS/GRAFTS ANGIOGRAPHY N/A 04/07/2019   Procedure: LEFT HEART CATH AND CORS/GRAFTS ANGIOGRAPHY;  Surgeon: [name], MD;  Location: [place] [facility];  Service: Cardiovascular;  Laterality: N/A;  . LEFT HEART CATH AND CORS/GRAFTS ANGIOGRAPHY N/A 04/21/2019   Procedure: LEFT  HEART CATH AND CORS/GRAFTS ANGIOGRAPHY;  Surgeon: [name], MD;  Location: [facility] [facility];  Service: Cardiovascular;  Laterality: N/A;  . MELANOMA EXCISION  2015   right ear  . ROBOT ASSISTED LAPAROSCOPIC RADICAL PROSTATECTOMY  11-09-2005  . SKIN CANCER EXCISION    . TEE WITHOUT CARDIOVERSION N/A 03/12/2019   Procedure: TRANSESOPHAGEAL ECHOCARDIOGRAM (TEE);  Surgeon: [name], MD;  Location: [facility];  Service: Open Heart Surgery;  Laterality: N/A;  . TONSILLECTOMY  child    There were no vitals filed for this visit.   Subjective Assessment - 01/10/20 1605    Subjective Pt  reports mild soreness today. More stiff/sore in AMs. Has been doing HEP    Patient Stated Goals decreased pain    Currently in Pain? Yes    Pain Score 3     Pain Location Back    Pain Orientation Right;Left    Pain Descriptors / Indicators Aching    Pain Onset More than a month ago    Pain Frequency Intermittent                             [facility] Adult PT Treatment/Exercise - 01/10/20 0001      Posture/Postural Control   Posture Comments poor seated posture, thoracic kyphosis, rounded shoulders, PPT.       Lumbar Exercises: Stretches   Active Hamstring Stretch 2 reps;30 seconds;Right;Left    Active Hamstring Stretch Limitations seated    Single Knee to Chest Stretch 2 reps;30 seconds    Pelvic Tilt 20 reps    Other Lumbar Stretch Exercise Supine pec stretch 30 sec x 3; Standing QL stretch 30 sec x 2 bil;       Lumbar Exercises: Aerobic   Recumbent Bike L1 x 7 min       Lumbar Exercises: Standing   Row 20 reps    Theraband Level (Row) Level 3 (Green)      Lumbar Exercises: Supine   Clam 20 reps    Clam Limitations GTB with TA    Bent Knee Raise 20 reps    Bent Knee Raise Limitations with TA      Manual Therapy   Manual Therapy Joint mobilization;Soft tissue mobilization;Passive ROM;Manual Traction    Manual therapy comments --    Joint Mobilization PA mobs for thoracic and lumbar spine, gr 2/3.     Soft tissue mobilization STM bil low thoracic/upper lumbar                     PT Short Term Goals - 12/31/19 1303      PT SHORT TERM GOAL #1   Title PT to be independent with initial HEP    Time 2    Period Weeks    Status New    Target Date 01/14/20             PT Long Term Goals - 12/31/19 1305      PT LONG TERM GOAL #1   Title Pt to be independent with final HEP for back    Time 6    Period Weeks    Status New    Target Date 02/11/20      PT LONG TERM GOAL #2   Title Pt to demo improved ROM for thoracic and lumbar spine, to  be WFL, and pain free, to improve ability for ADLs and IADLs.    Time 6  Period Weeks    Status New    Target Date 02/11/20      PT LONG TERM GOAL #3   Title Pt to demo proper lift, bend, squat technique, as well as optimal seated posture, for improving pain with activity    Time 6    Period Weeks    Status New    Target Date 02/11/20      PT LONG TERM GOAL #4   Title Pt to report decreased pain in thoracic and lumbar spine to be 0-2/10 with activity    Time 6    Period Weeks    Status New    Target Date 02/11/20                 Plan - 01/10/20 1608    Clinical Impression Statement Progressed ther ex today for spine stabilization and strengthening, HEP updated for this. Pt with soreness with palpation in mid/low thoracic spine. Plan to progress as tolerated.    Examination-Activity Limitations Locomotion Level;Bend;Lift;Stand;Squat;Carry;Sleep;Sit    Examination-Participation Restrictions Meal Prep;Cleaning;Driving;Shop;Laundry;Yard Work    Stability/Clinical Decision Making Stable/Uncomplicated    Rehab Potential Good    PT Frequency 2x / week    PT Duration 6 weeks    PT Treatment/Interventions ADLs/Self Care Home Management;Cryotherapy;Electrical Stimulation;Gait training;Ultrasound;Traction;Iontophoresis 4mg /ml Dexamethasone;Moist Heat;Functional mobility training;Therapeutic activities;Therapeutic exercise;Neuromuscular re-education;Patient/family education;Manual techniques;Dry needling;Passive range of motion;Scar mobilization;Vasopneumatic Device;Spinal Manipulations    PT Home Exercise Plan [email or web address]    Consulted and Agree with Plan of Care Patient           Patient will benefit from skilled therapeutic intervention in order to improve the following deficits and impairments:  Decreased range of motion, Increased muscle spasms, Decreased activity tolerance, Pain, Impaired flexibility, Improper body mechanics, Decreased mobility, Decreased strength  Visit  Diagnosis: Pain in thoracic spine  Chronic bilateral low back pain without sciatica     Problem List Patient Active Problem List   Diagnosis Date Noted  . Hyperlipidemia 04/22/2019  . Unstable angina ([place]) 04/19/2019  . HTN (hypertension) 04/09/2019  . Non-ST elevation (NSTEMI) myocardial infarction ([place]) 04/07/2019  . Ventricular fibrillation ([place]) 04/07/2019  . NSTEMI (non-ST elevated myocardial infarction) ([place]) 04/07/2019  . S/P CABG x 3 03/12/2019  . Coronary artery disease 03/12/2019  . Effort angina ([facility])   . Chest pain of uncertain etiology 19/41/7408  . Benign essential HTN 01/31/2019  . Mild obstructive sleep apnea 04/17/2018  . Postviral fatigue syndrome 03/27/2018  . Snoring 03/27/2018  . Excessive daytime sleepiness 03/27/2018  . Confusion 02/13/2018  . Malignant melanoma of toe of left foot ([place]) 09/20/2015  . History of prostate cancer 09/20/2015    Lyndee Hensen, PT, DPT 4:10 PM  01/10/20    [place] [place], [place], [address] Phone: [phone]   Fax:  [phone]  Name: DONIVIN WIRT MRN: [id number] Date of Birth: [birth year]

## 2020-01-13 ENCOUNTER — Ambulatory Visit: Payer: Medicare HMO | Admitting: Physical Therapy

## 2020-01-13 ENCOUNTER — Encounter: Payer: Self-pay | Admitting: Physical Therapy

## 2020-01-13 ENCOUNTER — Other Ambulatory Visit: Payer: Self-pay

## 2020-01-13 DIAGNOSIS — G8929 Other chronic pain: Secondary | ICD-10-CM

## 2020-01-13 DIAGNOSIS — M545 Low back pain: Secondary | ICD-10-CM

## 2020-01-13 DIAGNOSIS — M546 Pain in thoracic spine: Secondary | ICD-10-CM

## 2020-01-13 NOTE — Therapy (Signed)
[facility] [address] [place], [place], [address] Phone: [phone]   Fax:  [phone]  Physical Therapy Treatment  Patient Details  Name: Jared Roberts MRN: [id number] Date of Birth: [birth year] Referring Provider (PT): Lynne Leader   Encounter Date: 01/13/2020   PT End of Session - 01/13/20 1309    Visit Number 4    Number of Visits 12    Date for PT Re-Evaluation 02/11/20    Authorization Type Aetna Medicare    PT Start Time 1303    PT Stop Time 1343    PT Time Calculation (min) 40 min    Activity Tolerance Patient tolerated treatment well    Behavior During Therapy [facility] for tasks assessed/performed           Past Medical History:  Diagnosis Date   Coronary artery disease    DDD (degenerative disc disease), cervical    Depression    ED (erectile dysfunction) of organic origin    Generalized osteoarthritis    GERD (gastroesophageal reflux disease)    Hiatal hernia    History of chronic bronchitis    History of gastritis    3785   History of Helicobacter pylori infection    2003   History of melanoma excision    2015--  right ear   History of prostate cancer urologist-  dr [name]   2007--  pT2a Nx Mx,  Gleason 3+3, S/P  PROSTATECTOMY   History of squamous cell carcinoma excision    2011  left knee   Hyperlipidemia    Hyperplastic colon polyp 2009   Hypertension    Male hypogonadism    Melanoma ([place])    left 4th toe   Nephrolithiasis    Prostate cancer ([place])    Ventricular fibrillation ([place]) 04/07/2019   VF, en route witnessed by EMS, defibrillated x 1    Past Surgical History:  Procedure Laterality Date   AMPUTATION TOE Left 09/07/2015   Procedure: PARTIAL AMPUTATION TOE 4TH LEFT;  Surgeon: [name], MD;  Location: [place];  Service: Podiatry;  Laterality: Left;   APPENDECTOMY  1970's   CORONARY ARTERY BYPASS GRAFT N/A 03/12/2019   Procedure: CORONARY ARTERY  BYPASS GRAFTING (CABG) x 3 WITH ENDOSCOPIC HARVESTING OF RIGHT GREATER SAPHENOUS VEIN;  Surgeon: [name], MD;  Location: [place];  Service: Open Heart Surgery;  Laterality: N/A;   CORONARY BALLOON ANGIOPLASTY N/A 04/07/2019   Procedure: CORONARY BALLOON ANGIOPLASTY;  Surgeon: [name], MD;  Location: [place] [facility];  Service: Cardiovascular;  Laterality: N/A;   CORONARY STENT INTERVENTION N/A 04/21/2019   Procedure: CORONARY STENT INTERVENTION;  Surgeon: [name], MD;  Location: [place] [facility];  Service: Cardiovascular;  Laterality: N/A;   CYSTO/  CLOT EVACUATION POST PROSTATECTOMY  11-19-2005   CYSTO/  REMOVAL URETHRAL STONE AND URETHRAL STAPLES  06-02-2010   CYSTO/  URETEROSCOPIC STONE EXTRACTION  X2  1990's   KIDNEY STONE SURGERY     LEFT HEART CATH AND CORONARY ANGIOGRAPHY N/A 02/25/2019   Procedure: LEFT HEART CATH AND CORONARY ANGIOGRAPHY;  Surgeon: [name], MD;  Location: [place] [facility];  Service: Cardiovascular;  Laterality: N/A;   LEFT HEART CATH AND CORS/GRAFTS ANGIOGRAPHY N/A 04/07/2019   Procedure: LEFT HEART CATH AND CORS/GRAFTS ANGIOGRAPHY;  Surgeon: [name], MD;  Location: [place] [facility];  Service: Cardiovascular;  Laterality: N/A;   LEFT HEART CATH AND CORS/GRAFTS ANGIOGRAPHY N/A 04/21/2019   Procedure: LEFT  HEART CATH AND CORS/GRAFTS ANGIOGRAPHY;  Surgeon: [name], MD;  Location: [place] [facility];  Service: Cardiovascular;  Laterality: N/A;   MELANOMA EXCISION  2015   right ear   ROBOT ASSISTED LAPAROSCOPIC RADICAL PROSTATECTOMY  11-09-2005   SKIN CANCER EXCISION     TEE WITHOUT CARDIOVERSION N/A 03/12/2019   Procedure: TRANSESOPHAGEAL ECHOCARDIOGRAM (TEE);  Surgeon: [name], MD;  Location: [place];  Service: Open Heart Surgery;  Laterality: N/A;   TONSILLECTOMY  child    There were no vitals filed for this visit.   Subjective Assessment - 01/13/20 1308    Subjective Pt  states only mild soreness today, pain has been better.    Currently in Pain? Yes    Pain Score 2     Pain Location Back    Pain Orientation Right;Left    Pain Descriptors / Indicators Aching    Pain Type Chronic pain    Pain Onset More than a month ago    Pain Frequency Intermittent                             [facility] Adult PT Treatment/Exercise - 01/13/20 1307      Posture/Postural Control   Posture Comments poor seated posture, thoracic kyphosis, rounded shoulders, PPT.       Lumbar Exercises: Stretches   Active Hamstring Stretch 2 reps;30 seconds;Right;Left    Active Hamstring Stretch Limitations seated    Single Knee to Chest Stretch 2 reps;30 seconds    Pelvic Tilt 20 reps    Other Lumbar Stretch Exercise Supine pec stretch 30 sec x 3; Standing QL stretch 30 sec x 2 bil;       Lumbar Exercises: Aerobic   Recumbent Bike L1 x 8 min       Lumbar Exercises: Standing   Row 20 reps    Theraband Level (Row) Level 3 (Green)    Other Standing Lumbar Exercises Low Row RTB x 20;     Other Standing Lumbar Exercises Shoulder ER bil  RTB x 20;       Lumbar Exercises: Supine   Clam 20 reps    Clam Limitations GTB with TA    Bent Knee Raise 20 reps    Bent Knee Raise Limitations with TA    Bridge 20 reps    Straight Leg Raise 15 reps    Straight Leg Raises Limitations with TA    Other Supine Lumbar Exercises UE flexion and horiz abd x 10 each bil; with TA;       Lumbar Exercises: Sidelying   Hip Abduction 15 reps;Both      Manual Therapy   Manual Therapy Joint mobilization;Soft tissue mobilization;Passive ROM;Manual Traction    Joint Mobilization --    Soft tissue mobilization --                    PT Short Term Goals - 12/31/19 1303      PT SHORT TERM GOAL #1   Title PT to be independent with initial HEP    Time 2    Period Weeks    Status New    Target Date 01/14/20             PT Long Term Goals - 12/31/19 1305      PT LONG TERM  GOAL #1   Title Pt to be independent with final HEP for back    Time 6  Period Weeks    Status New    Target Date 02/11/20      PT LONG TERM GOAL #2   Title Pt to demo improved ROM for thoracic and lumbar spine, to be [facility], and pain free, to improve ability for ADLs and IADLs.    Time 6    Period Weeks    Status New    Target Date 02/11/20      PT LONG TERM GOAL #3   Title Pt to demo proper lift, bend, squat technique, as well as optimal seated posture, for improving pain with activity    Time 6    Period Weeks    Status New    Target Date 02/11/20      PT LONG TERM GOAL #4   Title Pt to report decreased pain in thoracic and lumbar spine to be 0-2/10 with activity    Time 6    Period Weeks    Status New    Target Date 02/11/20                 Plan - 01/13/20 1358    Clinical Impression Statement Pt with improving pain overall. He has improved ability for ther ex and strenthening in last couple sessions. He req mod-max cuing for lumbar and thoracic posture with exercises, with tendency for increased lumbar extension. Plan to progress strengthening, and continue education and practice with posture.    Examination-Activity Limitations Locomotion Level;Bend;Lift;Stand;Squat;Carry;Sleep;Sit    Examination-Participation Restrictions Meal Prep;Cleaning;Driving;Shop;Laundry;Yard Work    Stability/Clinical Decision Making Stable/Uncomplicated    Rehab Potential Good    PT Frequency 2x / week    PT Duration 6 weeks    PT Treatment/Interventions ADLs/Self Care Home Management;Cryotherapy;Electrical Stimulation;Gait training;Ultrasound;Traction;Iontophoresis 4mg /ml Dexamethasone;Moist Heat;Functional mobility training;Therapeutic activities;Therapeutic exercise;Neuromuscular re-education;Patient/family education;Manual techniques;Dry needling;Passive range of motion;Scar mobilization;Vasopneumatic Device;Spinal Manipulations    PT Home Exercise Plan [email or web address]    Consulted and Agree  with Plan of Care Patient           Patient will benefit from skilled therapeutic intervention in order to improve the following deficits and impairments:  Decreased range of motion, Increased muscle spasms, Decreased activity tolerance, Pain, Impaired flexibility, Improper body mechanics, Decreased mobility, Decreased strength  Visit Diagnosis: Pain in thoracic spine  Chronic bilateral low back pain without sciatica     Problem List Patient Active Problem List   Diagnosis Date Noted   Hyperlipidemia 04/22/2019   Unstable angina ([place]) 04/19/2019   HTN (hypertension) 04/09/2019   Non-ST elevation (NSTEMI) myocardial infarction ([place]) 04/07/2019   Ventricular fibrillation ([place]) 04/07/2019   NSTEMI (non-ST elevated myocardial infarction) ([place]) 04/07/2019   S/P CABG x 3 03/12/2019   Coronary artery disease 03/12/2019   Effort angina ([place])    Chest pain of uncertain etiology 62/94/7654   Benign essential HTN 01/31/2019   Mild obstructive sleep apnea 04/17/2018   Postviral fatigue syndrome 03/27/2018   Snoring 03/27/2018   Excessive daytime sleepiness 03/27/2018   Confusion 02/13/2018   Malignant melanoma of toe of left foot ([place]) 09/20/2015   History of prostate cancer 09/20/2015    Lyndee Hensen, PT, DPT 2:00 PM  01/13/20    [facility] [address] [place], [place], [address] Phone: [phone]   Fax:  [phone]  Name: Jared [place] MRN: [id number] Date of Birth: [birth year]

## 2020-01-20 ENCOUNTER — Encounter: Payer: Self-pay | Admitting: Physical Therapy

## 2020-01-20 ENCOUNTER — Ambulatory Visit: Payer: Medicare HMO | Admitting: Physical Therapy

## 2020-01-20 ENCOUNTER — Other Ambulatory Visit: Payer: Self-pay

## 2020-01-20 DIAGNOSIS — M546 Pain in thoracic spine: Secondary | ICD-10-CM

## 2020-01-20 DIAGNOSIS — G8929 Other chronic pain: Secondary | ICD-10-CM

## 2020-01-20 DIAGNOSIS — M545 Low back pain: Secondary | ICD-10-CM

## 2020-01-20 NOTE — Therapy (Signed)
[facility] [address] [place], [place], [address] Phone: [phone]   Fax:  [phone]  Physical Therapy Treatment  Patient Details  Name: Jared [place] MRN: [id number] Date of Birth: 02/15/1946 Referring Provider (PT): Lynne Leader   Encounter Date: 01/20/2020   PT End of Session - 01/20/20 1442    Visit Number 5    Number of Visits 12    Date for PT Re-Evaluation 02/11/20    Authorization Type Aetna Medicare    PT Start Time 0160    PT Stop Time 1515    PT Time Calculation (min) 39 min    Activity Tolerance Patient tolerated treatment well    Behavior During Therapy [facility] for tasks assessed/performed           Past Medical History:  Diagnosis Date  . Coronary artery disease   . DDD (degenerative disc disease), cervical   . Depression   . ED (erectile dysfunction) of organic origin   . Generalized osteoarthritis   . GERD (gastroesophageal reflux disease)   . Hiatal hernia   . History of chronic bronchitis   . History of gastritis    2003  . History of Helicobacter pylori infection    2003  . History of melanoma excision    2015--  right ear  . History of prostate cancer urologist-  dr [name]   2007--  pT2a Nx Mx,  Gleason 3+3, S/P  PROSTATECTOMY  . History of squamous cell carcinoma excision    2011  left knee  . Hyperlipidemia   . Hyperplastic colon polyp 2009  . Hypertension   . Male hypogonadism   . Melanoma ([place])    left 4th toe  . Nephrolithiasis   . Prostate cancer ([place])   . Ventricular fibrillation ([place]) 04/07/2019   VF, en route witnessed by EMS, defibrillated x 1    Past Surgical History:  Procedure Laterality Date  . AMPUTATION TOE Left 09/07/2015   Procedure: PARTIAL AMPUTATION TOE 4TH LEFT;  Surgeon: [name], MD;  Location: [place];  Service: Podiatry;  Laterality: Left;  . APPENDECTOMY  1970's  . CORONARY ARTERY BYPASS GRAFT N/A 03/12/2019   Procedure: CORONARY ARTERY  BYPASS GRAFTING (CABG) x 3 WITH ENDOSCOPIC HARVESTING OF RIGHT GREATER SAPHENOUS VEIN;  Surgeon: [name], MD;  Location: [place];  Service: Open Heart Surgery;  Laterality: N/A;  . CORONARY BALLOON ANGIOPLASTY N/A 04/07/2019   Procedure: CORONARY BALLOON ANGIOPLASTY;  Surgeon: [name], MD;  Location: [place] [facility];  Service: Cardiovascular;  Laterality: N/A;  . CORONARY STENT INTERVENTION N/A 04/21/2019   Procedure: CORONARY STENT INTERVENTION;  Surgeon: [name], MD;  Location: [place] [facility];  Service: Cardiovascular;  Laterality: N/A;  . CYSTO/  CLOT EVACUATION POST PROSTATECTOMY  11-19-2005  . CYSTO/  REMOVAL URETHRAL STONE AND URETHRAL STAPLES  06-02-2010  . CYSTO/  URETEROSCOPIC STONE EXTRACTION  X2  1990's  . KIDNEY STONE SURGERY    . LEFT HEART CATH AND CORONARY ANGIOGRAPHY N/A 02/25/2019   Procedure: LEFT HEART CATH AND CORONARY ANGIOGRAPHY;  Surgeon: [name], MD;  Location: [place] [facility];  Service: Cardiovascular;  Laterality: N/A;  . LEFT HEART CATH AND CORS/GRAFTS ANGIOGRAPHY N/A 04/07/2019   Procedure: LEFT HEART CATH AND CORS/GRAFTS ANGIOGRAPHY;  Surgeon: [name], MD;  Location: [place] [facility];  Service: Cardiovascular;  Laterality: N/A;  . LEFT HEART CATH AND CORS/GRAFTS ANGIOGRAPHY N/A 04/21/2019   Procedure: LEFT  HEART CATH AND CORS/GRAFTS ANGIOGRAPHY;  Surgeon: [name], MD;  Location: [place] [facility];  Service: Cardiovascular;  Laterality: N/A;  . MELANOMA EXCISION  2015   right ear  . ROBOT ASSISTED LAPAROSCOPIC RADICAL PROSTATECTOMY  11-09-2005  . SKIN CANCER EXCISION    . TEE WITHOUT CARDIOVERSION N/A 03/12/2019   Procedure: TRANSESOPHAGEAL ECHOCARDIOGRAM (TEE);  Surgeon: [name], MD;  Location: [place];  Service: Open Heart Surgery;  Laterality: N/A;  . TONSILLECTOMY  child    There were no vitals filed for this visit.   Subjective Assessment - 01/20/20 1439    Subjective Pt  reports a few days of increased back soreness, but has decreased now. He was replacing ceiling tiles up on a ladder.  Minimal pain today. Has been doing HEP    Currently in Pain? Yes    Pain Score 2     Pain Location Back    Pain Orientation Right;Left    Pain Descriptors / Indicators Aching    Pain Type Chronic pain    Pain Onset More than a month ago    Pain Frequency Intermittent                             [facility] Adult PT Treatment/Exercise - 01/20/20 1443      Posture/Postural Control   Posture Comments poor seated posture, thoracic kyphosis, rounded shoulders, PPT.       Lumbar Exercises: Stretches   Active Hamstring Stretch 2 reps;30 seconds;Right;Left    Active Hamstring Stretch Limitations seated    Single Knee to Chest Stretch 2 reps;30 seconds    Pelvic Tilt --    Other Lumbar Stretch Exercise --      Lumbar Exercises: Aerobic   Recumbent Bike L1 x 8 min       Lumbar Exercises: Standing   Row 20 reps    Theraband Level (Row) Level 3 (Green)    Other Standing Lumbar Exercises Low Row RTB x 20;     Other Standing Lumbar Exercises Shoulder ER bil  RTB x 20;       Lumbar Exercises: Supine   AB Set Limitations TA with UE flexion x 10 and horiz abd x 10;     Clam 20 reps    Clam Limitations GTB with TA    Bent Knee Raise 20 reps    Bent Knee Raise Limitations with TA    Bridge 20 reps    Straight Leg Raise 15 reps    Straight Leg Raises Limitations with TA    Other Supine Lumbar Exercises --      Lumbar Exercises: Sidelying   Hip Abduction 15 reps;Both      Manual Therapy   Manual Therapy Joint mobilization;Soft tissue mobilization;Passive ROM;Manual Traction                    PT Short Term Goals - 12/31/19 1303      PT SHORT TERM GOAL #1   Title PT to be independent with initial HEP    Time 2    Period Weeks    Status New    Target Date 01/14/20             PT Long Term Goals - 12/31/19 1305      PT LONG TERM GOAL  #1   Title Pt to be independent with final HEP for back    Time 6  Period Weeks    Status New    Target Date 02/11/20      PT LONG TERM GOAL #2   Title Pt to demo improved ROM for thoracic and lumbar spine, to be [facility], and pain free, to improve ability for ADLs and IADLs.    Time 6    Period Weeks    Status New    Target Date 02/11/20      PT LONG TERM GOAL #3   Title Pt to demo proper lift, bend, squat technique, as well as optimal seated posture, for improving pain with activity    Time 6    Period Weeks    Status New    Target Date 02/11/20      PT LONG TERM GOAL #4   Title Pt to report decreased pain in thoracic and lumbar spine to be 0-2/10 with activity    Time 6    Period Weeks    Status New    Target Date 02/11/20                 Plan - 01/20/20 1630    Clinical Impression Statement Pt with good ability for lumbar/thoracic mobility today, all motions WFL, but does have mild pain on R with L rotation.Pt progressing with ability for ther ex and core strengthening. Req mod verbal cueing for decreasing lumbar extension with several exercises.    Examination-Activity Limitations Locomotion Level;Bend;Lift;Stand;Squat;Carry;Sleep;Sit    Examination-Participation Restrictions Meal Prep;Cleaning;Driving;Shop;Laundry;Yard Work    Stability/Clinical Decision Making Stable/Uncomplicated    Rehab Potential Good    PT Frequency 2x / week    PT Duration 6 weeks    PT Treatment/Interventions ADLs/Self Care Home Management;Cryotherapy;Electrical Stimulation;Gait training;Ultrasound;Traction;Iontophoresis 4mg /ml Dexamethasone;Moist Heat;Functional mobility training;Therapeutic activities;Therapeutic exercise;Neuromuscular re-education;Patient/family education;Manual techniques;Dry needling;Passive range of motion;Scar mobilization;Vasopneumatic Device;Spinal Manipulations    PT Home Exercise Plan [email or web address]    Consulted and Agree with Plan of Care Patient           Patient  will benefit from skilled therapeutic intervention in order to improve the following deficits and impairments:  Decreased range of motion, Increased muscle spasms, Decreased activity tolerance, Pain, Impaired flexibility, Improper body mechanics, Decreased mobility, Decreased strength  Visit Diagnosis: Pain in thoracic spine  Chronic bilateral low back pain without sciatica     Problem List Patient Active Problem List   Diagnosis Date Noted  . Hyperlipidemia 04/22/2019  . Unstable angina ([place]) 04/19/2019  . HTN (hypertension) 04/09/2019  . Non-ST elevation (NSTEMI) myocardial infarction ([place]) 04/07/2019  . Ventricular fibrillation ([place]) 04/07/2019  . NSTEMI (non-ST elevated myocardial infarction) ([place]) 04/07/2019  . S/P CABG x 3 03/12/2019  . Coronary artery disease 03/12/2019  . Effort angina ([facility])   . Chest pain of uncertain etiology 48/27/0786  . Benign essential HTN 01/31/2019  . Mild obstructive sleep apnea 04/17/2018  . Postviral fatigue syndrome 03/27/2018  . Snoring 03/27/2018  . Excessive daytime sleepiness 03/27/2018  . Confusion 02/13/2018  . Malignant melanoma of toe of left foot ([place]) 09/20/2015  . History of prostate cancer 09/20/2015    Lyndee Hensen, PT, DPT 4:32 PM  01/20/20    [facility] [place], [place], [address] Phone: [phone]   Fax:  [phone]  Name: Jared [place] MRN: [id number] Date of Birth: 1945/12/31

## 2020-01-20 NOTE — Patient Instructions (Signed)
Access Code: [email or web address] URL: [email or web address] Date: 01/20/2020 Prepared by: Lyndee Hensen  Exercises Seated Hamstring Stretch - 2 x daily - 3 reps - 30 hold Supine Single Knee to Chest Stretch - 2 x daily - 3 reps - 30 hold Supine Posterior Pelvic Tilt - 2 x daily - 1-2 sets - 10 reps Supine March - 1 x daily - 2 sets - 10 reps Supine Bridge - 1 x daily - 2 sets - 10 reps Straight Leg Raise - 1 x daily - 2 sets - 10 reps Sidelying Hip Abduction - 1 x daily - 2 sets - 10 reps Scapular Retraction with Resistance - 1 x daily - 2 sets - 10 reps Supine Pectoralis Stretch - 2 x daily - 3 reps - 30 hold

## 2020-01-22 ENCOUNTER — Telehealth: Payer: Self-pay | Admitting: *Deleted

## 2020-01-22 DIAGNOSIS — Z03818 Encounter for observation for suspected exposure to other biological agents ruled out: Secondary | ICD-10-CM | POA: Diagnosis not present

## 2020-01-22 DIAGNOSIS — R198 Other specified symptoms and signs involving the digestive system and abdomen: Secondary | ICD-10-CM | POA: Diagnosis not present

## 2020-01-22 DIAGNOSIS — R1013 Epigastric pain: Secondary | ICD-10-CM | POA: Diagnosis not present

## 2020-01-22 DIAGNOSIS — Z1159 Encounter for screening for other viral diseases: Secondary | ICD-10-CM | POA: Diagnosis not present

## 2020-01-22 NOTE — Telephone Encounter (Signed)
   [facility] Pre-operative Risk Assessment    HEARTCARE STAFF: - Please ensure there is not already an duplicate clearance open for this procedure. - Under Visit Info/Reason for Call, type in Other and utilize the format Clearance MM/DD/YY or Clearance TBD. Do not use dashes or single digits. - If request is for dental extraction, please clarify the # of teeth to be extracted.  Request for surgical clearance: NOTES ON REQUEST STATES URGENT   1. What type of surgery is being performed? ENDOSCOPY   2. When is this surgery scheduled? 01/27/20   3. What type of clearance is required (medical clearance vs. Pharmacy clearance to hold med vs. Both)? MEDICAL  4. Are there any medications that need to be held prior to surgery and how long? BRILINTA   5. Practice name and name of physician performing surgery? [facility]; DR. MAGOD   6. What is the office phone number? [phone]   7.   What is the office fax number? [phone]  8.   Anesthesia type (None, local, MAC, general) ? NOT LISTED   Julaine Hua 01/22/2020, 3:24 PM  _________________________________________________________________   (provider comments below)

## 2020-01-23 NOTE — Telephone Encounter (Signed)
Jared Roberts is a 74 y.o. male needs to have urgent endoscopy.  He was last seen in the clinic on 12/24/2019.  He is doing somewhat better at that time.  May we hold his Brilinta for the procedure?    He has a PMH of HTN, TIA and carotid stenosis .  CABG x3 02/2019.  PCI with DES x1 to his circumflex 03/2019     December 24, 2019:     Jared Roberts is seen  for follow-up of his coronary artery disease.  He has had coronary artery bypass grafting.  He then has had 2 subsequent PCI's.     Had a stomach ache for a month.   Does not know the etiology  No cp , no dyspnea  Exercising a little.   Going for back PT  I reviewed his recent labs - LDL is 64.  Trigs are slighlty elevated at 157.   Please direct your response to CV DIV preop pool.  Thank you for your help.   Jared [place]. Jared Casebeer NP-C    01/23/2020, 9:49 AM [place] [address] Office [phone] Fax [phone]

## 2020-01-23 NOTE — Telephone Encounter (Signed)
Forwarded to requesting part(updated)

## 2020-01-23 NOTE — Telephone Encounter (Signed)
   Correction to previous pre-op clearance.  Jared Roberts has done well following his stent placement to the circumflex artery in September, 2020.  He now needs to have urgent endoscopy. While we normally would like 12 months of dual antiplatelet therapy it is been 10 months and given the urgency of this upcoming procedure I think it will be safest to go ahead and hold his aspirin and Brilinta  for 5 to 7 days prior to this endoscopy procedure.  Restart ASA and Brilinta  if possible the evening after the procedure or the following day    Jared Moores, MD  01/23/2020 11:43 AM    [facility] [place],  [place], [place]  [address] Phone: [phone]; Fax: [phone]

## 2020-01-23 NOTE — Telephone Encounter (Signed)
   Primary Cardiologist: Mertie Moores, MD  Chart reviewed as part of pre-operative protocol coverage. Given past medical history and time since last visit, based on ACC/AHA guidelines, Zerick Prevette Gillin would be at acceptable risk for the planned procedure without further cardiovascular testing.   Rush Landmark has done well following his stent placement to the circumflex artery in September, 2020. He now needs to have urgent endoscopy. While we normally would like 12 months of dual antiplatelet therapy it is been 10 months and given the urgency of this upcoming procedure I think it will be safest to go ahead and hold his aspirin and Brilinta  for 5 to 7 days prior to this endoscopy procedure.  Restart ASA and Brilinta  if possible the evening after the procedure or the following day   I will route this recommendation to the requesting party via [place] fax function and remove from pre-op pool.  Please call with questions.  Jossie Ng. Brenen Beigel NP-C    01/23/2020, 12:34 PM [place] [address] Office [phone] Fax [phone]

## 2020-01-23 NOTE — Telephone Encounter (Signed)
Jared Roberts has done well following his stent placement to the circumflex artery in September, 2020.  He now needs to have urgent endoscopy. While we normally would like 12 months of dual antiplatelet therapy it is been 10 months and given the urgency of this upcoming procedure I think it will be safest to go ahead and hold his aspirin and Plavix for 5 to 7 days prior to this endoscopy procedure.  Restart ASA and plavix if possible the evening after the procedure or the following day    Jared Moores, MD  01/23/2020 11:43 AM    [facility] [place],  [place], [place]  [address] Phone: [phone]; Fax: [phone]

## 2020-01-27 DIAGNOSIS — K2980 Duodenitis without bleeding: Secondary | ICD-10-CM | POA: Diagnosis not present

## 2020-01-27 DIAGNOSIS — R1013 Epigastric pain: Secondary | ICD-10-CM | POA: Diagnosis not present

## 2020-01-27 DIAGNOSIS — K449 Diaphragmatic hernia without obstruction or gangrene: Secondary | ICD-10-CM | POA: Diagnosis not present

## 2020-01-28 ENCOUNTER — Encounter: Payer: Medicare HMO | Admitting: Physical Therapy

## 2020-02-02 ENCOUNTER — Ambulatory Visit: Payer: Medicare HMO | Admitting: Physical Therapy

## 2020-02-02 ENCOUNTER — Encounter: Payer: Self-pay | Admitting: Physical Therapy

## 2020-02-02 ENCOUNTER — Other Ambulatory Visit: Payer: Self-pay

## 2020-02-02 DIAGNOSIS — M545 Low back pain, unspecified: Secondary | ICD-10-CM

## 2020-02-02 DIAGNOSIS — M546 Pain in thoracic spine: Secondary | ICD-10-CM

## 2020-02-02 DIAGNOSIS — G8929 Other chronic pain: Secondary | ICD-10-CM | POA: Diagnosis not present

## 2020-02-02 NOTE — Patient Instructions (Signed)
Access Code: [email or web address] URL: [email or web address] Date: 02/02/2020 Prepared by: Lyndee Hensen  Exercises Seated Hamstring Stretch - 2 x daily - 3 reps - 30 hold Supine Single Knee to Chest Stretch - 2 x daily - 3 reps - 30 hold Supine Posterior Pelvic Tilt - 2 x daily - 1-2 sets - 10 reps Supine March - 1 x daily - 2 sets - 10 reps Supine Bridge - 1 x daily - 2 sets - 10 reps Straight Leg Raise - 1 x daily - 2 sets - 10 reps Sidelying Hip Abduction - 1 x daily - 2 sets - 10 reps Hooklying Shoulder I - 1 x daily - 2 sets - 10 reps Scapular Retraction with Resistance - 1 x daily - 2 sets - 10 reps Shoulder External Rotation and Scapular Retraction with Resistance - 1 x daily - 2 sets - 10 reps

## 2020-02-02 NOTE — Therapy (Signed)
[facility] [address] [place], [place], [address] Phone: [phone]   Fax:  [phone]  Physical Therapy Treatment/Discharge   Patient Details  Name: Jared Roberts MRN: [id number] Date of Birth: [birth year] Referring Provider (PT): Lynne Leader   Encounter Date: 02/02/2020   PT End of Session - 02/02/20 1355    Visit Number 6    Number of Visits 12    Date for PT Re-Evaluation 02/11/20    Authorization Type [employer]    PT Start Time 7681    PT Stop Time 1430    PT Time Calculation (min) 45 min    Activity Tolerance Patient tolerated treatment well    Behavior During Therapy [facility] for tasks assessed/performed           Past Medical History:  Diagnosis Date  . Coronary artery disease   . DDD (degenerative disc disease), cervical   . Depression   . ED (erectile dysfunction) of organic origin   . Generalized osteoarthritis   . GERD (gastroesophageal reflux disease)   . Hiatal hernia   . History of chronic bronchitis   . History of gastritis    2003  . History of Helicobacter pylori infection    2003  . History of melanoma excision    2015--  right ear  . History of prostate cancer urologist-  dr [name]   2007--  pT2a Nx Mx,  Gleason 3+3, S/P  PROSTATECTOMY  . History of squamous cell carcinoma excision    2011  left knee  . Hyperlipidemia   . Hyperplastic colon polyp 2009  . Hypertension   . Male hypogonadism   . Melanoma ([place])    left 4th toe  . Nephrolithiasis   . Prostate cancer ([place])   . Ventricular fibrillation ([place]) 04/07/2019   VF, en route witnessed by EMS, defibrillated x 1    Past Surgical History:  Procedure Laterality Date  . AMPUTATION TOE Left 09/07/2015   Procedure: PARTIAL AMPUTATION TOE 4TH LEFT;  Surgeon: [name], MD;  Location: [facility];  Service: Podiatry;  Laterality: Left;  . APPENDECTOMY  1970's  . CORONARY ARTERY BYPASS GRAFT N/A 03/12/2019   Procedure:  CORONARY ARTERY BYPASS GRAFTING (CABG) x 3 WITH ENDOSCOPIC HARVESTING OF RIGHT GREATER SAPHENOUS VEIN;  Surgeon: [name], MD;  Location: [facility];  Service: Open Heart Surgery;  Laterality: N/A;  . CORONARY BALLOON ANGIOPLASTY N/A 04/07/2019   Procedure: CORONARY BALLOON ANGIOPLASTY;  Surgeon: [name], MD;  Location: [place];  Service: Cardiovascular;  Laterality: N/A;  . CORONARY STENT INTERVENTION N/A 04/21/2019   Procedure: CORONARY STENT INTERVENTION;  Surgeon: [name], MD;  Location: [facility] [place];  Service: Cardiovascular;  Laterality: N/A;  . CYSTO/  CLOT EVACUATION POST PROSTATECTOMY  11-19-2005  . CYSTO/  REMOVAL URETHRAL STONE AND URETHRAL STAPLES  06-02-2010  . CYSTO/  URETEROSCOPIC STONE EXTRACTION  X2  1990's  . KIDNEY STONE SURGERY    . LEFT HEART CATH AND CORONARY ANGIOGRAPHY N/A 02/25/2019   Procedure: LEFT HEART CATH AND CORONARY ANGIOGRAPHY;  Surgeon: [name], MD;  Location: [facility];  Service: Cardiovascular;  Laterality: N/A;  . LEFT HEART CATH AND CORS/GRAFTS ANGIOGRAPHY N/A 04/07/2019   Procedure: LEFT HEART CATH AND CORS/GRAFTS ANGIOGRAPHY;  Surgeon: [name], MD;  Location: [place] [facility];  Service: Cardiovascular;  Laterality: N/A;  . LEFT HEART CATH AND CORS/GRAFTS ANGIOGRAPHY N/A 04/21/2019   Procedure:  LEFT HEART CATH AND CORS/GRAFTS ANGIOGRAPHY;  Surgeon: [name], MD;  Location: [facility];  Service: Cardiovascular;  Laterality: N/A;  . MELANOMA EXCISION  2015   right ear  . ROBOT ASSISTED LAPAROSCOPIC RADICAL PROSTATECTOMY  11-09-2005  . SKIN CANCER EXCISION    . TEE WITHOUT CARDIOVERSION N/A 03/12/2019   Procedure: TRANSESOPHAGEAL ECHOCARDIOGRAM (TEE);  Surgeon: [name], MD;  Location: [facility];  Service: Open Heart Surgery;  Laterality: N/A;  . TONSILLECTOMY  child    There were no vitals filed for this visit.   Subjective Assessment - 02/02/20 1352     Subjective Pt reports much less pain. States stiffness and pain when first waking up that improves by mid morning. 3/10 at worst    Patient Stated Goals decreased pain    Currently in Pain? No/denies    Pain Score 0-No pain    Pain Location Back    Pain Orientation Right;Left    Pain Descriptors / Indicators Aching    Pain Type Chronic pain    Pain Radiating Towards 3/10 pain this am, but none now.    Pain Onset More than a month ago    Pain Frequency Intermittent              [facility] PT Assessment - 02/02/20 1346      AROM   Lumbar Flexion wfl    Lumbar Extension wfl    Lumbar - Right Side Bend wfl    Lumbar - Left Side Bend wfl    Lumbar - Right Rotation wfl    Lumbar - Left Rotation wfl      Strength   Overall Strength Comments Hips: 4+/5                          [facility] Adult PT Treatment/Exercise - 02/02/20 1346      Posture/Postural Control   Posture Comments poor seated posture, thoracic kyphosis, rounded shoulders, PPT.       Lumbar Exercises: Stretches   Active Hamstring Stretch 2 reps;30 seconds;Right;Left    Active Hamstring Stretch Limitations seated    Single Knee to Chest Stretch 2 reps;30 seconds      Lumbar Exercises: Aerobic   Recumbent Bike L1 x 9 min       Lumbar Exercises: Standing   Functional Squats 15 reps    Lifting From 12";10 reps    Lifting Weights (lbs) 10    Row 20 reps    Theraband Level (Row) Level 3 (Green)    Other Standing Lumbar Exercises --    Other Standing Lumbar Exercises Shoulder ER bil  RTB x 20;       Lumbar Exercises: Supine   AB Set Limitations TA with UE flexion x 15     Clam --    Clam Limitations --    Bent Knee Raise 20 reps    Bent Knee Raise Limitations with GTB    Bridge 20 reps    Straight Leg Raise 15 reps    Straight Leg Raises Limitations with TA    Other Supine Lumbar Exercises --      Lumbar Exercises: Sidelying   Hip Abduction --      Manual Therapy   Manual Therapy Joint  mobilization;Soft tissue mobilization;Passive ROM;Manual Traction                  PT Education - 02/02/20 1431    Education Details HEP updated  Person(s) Educated Patient    Methods Explanation;Demonstration;Verbal cues;Handout    Comprehension Verbalized understanding;Returned demonstration            PT Short Term Goals - 02/02/20 1357      PT SHORT TERM GOAL #1   Title PT to be independent with initial HEP    Time 2    Period Weeks    Status Achieved    Target Date 01/14/20             PT Long Term Goals - 02/02/20 1357      PT LONG TERM GOAL #1   Title Pt to be independent with final HEP for back    Time 6    Period Weeks    Status Achieved      PT LONG TERM GOAL #2   Title Pt to demo improved ROM for thoracic and lumbar spine, to be [facility], and pain free, to improve ability for ADLs and IADLs.    Time 6    Period Weeks    Status Achieved      PT LONG TERM GOAL #3   Title Pt to demo proper lift, bend, squat technique, as well as optimal seated posture, for improving pain with activity    Time 6    Period Weeks    Status Achieved      PT LONG TERM GOAL #4   Title Pt to report decreased pain in thoracic and lumbar spine to be 0-2/10 with activity    Time 6    Period Weeks    Status Achieved                 Plan - 02/02/20 2051    Clinical Impression Statement Pt with much improved pain overall, as well as improved postural awareness, and ability for HEP. Pt with stiffness and mild soreness in AM, and is managing well. Pt has met goals at this time and is ready for D/c to HEP. Final HEP reviewed in detail today, pt in agreement with d/c.    Examination-Activity Limitations Locomotion Level;Bend;Lift;Stand;Squat;Carry;Sleep;Sit    Examination-Participation Restrictions Meal Prep;Cleaning;Driving;Shop;Laundry;Yard Work    Stability/Clinical Decision Making Stable/Uncomplicated    Rehab Potential Good    PT Frequency 2x / week    PT  Duration 6 weeks    PT Treatment/Interventions ADLs/Self Care Home Management;Cryotherapy;Electrical Stimulation;Gait training;Ultrasound;Traction;Iontophoresis 29m/ml Dexamethasone;Moist Heat;Functional mobility training;Therapeutic activities;Therapeutic exercise;Neuromuscular re-education;Patient/family education;Manual techniques;Dry needling;Passive range of motion;Scar mobilization;Vasopneumatic Device;Spinal Manipulations    PT Home Exercise Plan [email or web address]    Consulted and Agree with Plan of Care Patient           Patient will benefit from skilled therapeutic intervention in order to improve the following deficits and impairments:  Decreased range of motion, Increased muscle spasms, Decreased activity tolerance, Pain, Impaired flexibility, Improper body mechanics, Decreased mobility, Decreased strength  Visit Diagnosis: Pain in thoracic spine  Chronic bilateral low back pain without sciatica     Problem List Patient Active Problem List   Diagnosis Date Noted  . Hyperlipidemia 04/22/2019  . Unstable angina ([facility] 04/19/2019  . HTN (hypertension) 04/09/2019  . Non-ST elevation (NSTEMI) myocardial infarction ([facility] 04/07/2019  . Ventricular fibrillation ([facility] 04/07/2019  . NSTEMI (non-ST elevated myocardial infarction) ([facility] 04/07/2019  . S/P CABG x 3 03/12/2019  . Coronary artery disease 03/12/2019  . Effort angina ([facility])   . Chest pain of uncertain etiology 095/03/3266 . Benign essential HTN 01/31/2019  . Mild obstructive sleep apnea 04/17/2018  .  Postviral fatigue syndrome 03/27/2018  . Snoring 03/27/2018  . Excessive daytime sleepiness 03/27/2018  . Confusion 02/13/2018  . Malignant melanoma of toe of left foot ([place]) 09/20/2015  . History of prostate cancer 09/20/2015    Lyndee Hensen 02/02/2020, 8:56 PM  [facility] [address] [place], [place], [address] Phone: [phone]   Fax:  [phone]  Name: Jared Roberts MRN: [id number] Date of Birth: Oct 18, 1945     PHYSICAL THERAPY DISCHARGE SUMMARY  Visits from Start of Care: 6 Plan: Patient agrees to discharge.  Patient goals were met. Patient is being discharged due to meeting the stated rehab goals.  ?????       Lyndee Hensen, PT, DPT 8:56 PM  02/02/20

## 2020-02-19 DIAGNOSIS — R102 Pelvic and perineal pain: Secondary | ICD-10-CM | POA: Diagnosis not present

## 2020-03-17 ENCOUNTER — Other Ambulatory Visit: Payer: Self-pay | Admitting: Cardiovascular Disease

## 2020-04-20 ENCOUNTER — Other Ambulatory Visit: Payer: Self-pay | Admitting: Cardiovascular Disease

## 2020-04-22 DIAGNOSIS — R69 Illness, unspecified: Secondary | ICD-10-CM | POA: Diagnosis not present

## 2020-05-05 ENCOUNTER — Other Ambulatory Visit: Payer: Self-pay | Admitting: Cardiovascular Disease

## 2020-05-14 ENCOUNTER — Encounter: Payer: Self-pay | Admitting: Physician Assistant

## 2020-05-18 DIAGNOSIS — R69 Illness, unspecified: Secondary | ICD-10-CM | POA: Diagnosis not present

## 2020-05-31 DIAGNOSIS — M8580 Other specified disorders of bone density and structure, unspecified site: Secondary | ICD-10-CM | POA: Diagnosis not present

## 2020-05-31 DIAGNOSIS — D649 Anemia, unspecified: Secondary | ICD-10-CM | POA: Diagnosis not present

## 2020-06-30 ENCOUNTER — Ambulatory Visit: Payer: Medicare HMO | Admitting: Physician Assistant

## 2020-07-01 ENCOUNTER — Ambulatory Visit: Payer: Medicare HMO | Admitting: Physician Assistant

## 2020-07-01 NOTE — Progress Notes (Signed)
Cardiology Office Note:    Date:  07/02/2020   ID:  Jared Roberts, DOB [birth year], MRN [id number]  PCP:  Jared Pretty, MD  [facility] Cardiologist:  Mertie Moores, MD  [place] Electrophysiologist:  None   Chief Complaint: 6 months follow up  History of Present Illness:    Jared Roberts is a 74 y.o. male with a hx of CAD's s/p CABG with early graft failure leading to VF arrest requiring PCI to graft, hypertension, TIA, coronary artery disease and hyperlipidemia seen for follow-up.  Established care with Dr. Acie Roberts in July 2020 for chest painand had acoronary CT that wasabnormal showing significant stenosis. Subsequent heart catheterizationrevealed severe coronary artery disease.He underwentCABG 03/12/19 withLIMA-LAD, SVG-OM, SVG-PDAhowever[facility]edback[facility] w/ VF arrest. He was administered a single shock at 200J and NSR was restored. 04/07/2019 which showed occlusion of the saphenous vein graft to the distal circumflex and probable embolization into the obtuse marginal beyond the graft insertion site.He had successful mechanical reperfusion with angioplasty onlysecondary to smallvessel size.Echo showed preserved LVEF.VF was related to acute ischemia. No indication for ICD.   However he is being admitted 2 weeks later with unstable angina 04/19/2019.  He underwent cardiac catheterization with s/p DES x1 to mid circumflex along with balloon angioplasty to small caliber OM branch.   Patient required to hold his dual antiplatelet therapy of aspirin and Brilinta in July 2021 for urgent endoscopy.  Here today for follow up.  Patient noted intermittent tachycardia but no sustained, especially after drinking coffee.  He takes his metoprolol 9:30 AM, 1:30 PM and 10:30 PM.  He drinks his coffee around 7:30 AM.  He denies chest pain, shortness of breath, orthopnea, PND, syncope, lower extremity edema or melena.  Does feel intermittent dizziness with  tachycardia.  Past Medical History:  Diagnosis Date  . Coronary artery disease   . DDD (degenerative disc disease), cervical   . Depression   . ED (erectile dysfunction) of organic origin   . Generalized osteoarthritis   . GERD (gastroesophageal reflux disease)   . Hiatal hernia   . History of chronic bronchitis   . History of gastritis    2003  . History of Helicobacter pylori infection    2003  . History of melanoma excision    2015--  right ear  . History of prostate cancer urologist-  dr [name]   2007--  pT2a Nx Mx,  Gleason 3+3, S/P  PROSTATECTOMY  . History of squamous cell carcinoma excision    2011  left knee  . Hyperlipidemia   . Hyperplastic colon polyp 2009  . Hypertension   . Male hypogonadism   . Melanoma ([place])    left 4th toe  . Nephrolithiasis   . Prostate cancer ([place])   . Ventricular fibrillation ([place]) 04/07/2019   VF, en route witnessed by EMS, defibrillated x 1    Past Surgical History:  Procedure Laterality Date  . AMPUTATION TOE Left 09/07/2015   Procedure: PARTIAL AMPUTATION TOE 4TH LEFT;  Surgeon: [name], MD;  Location: [facility];  Service: Podiatry;  Laterality: Left;  . APPENDECTOMY  1970's  . CORONARY ARTERY BYPASS GRAFT N/A 03/12/2019   Procedure: CORONARY ARTERY BYPASS GRAFTING (CABG) x 3 WITH ENDOSCOPIC HARVESTING OF RIGHT GREATER SAPHENOUS VEIN;  Surgeon: Jared Matte, MD;  Location: [facility];  Service: Open Heart Surgery;  Laterality: N/A;  . CORONARY BALLOON ANGIOPLASTY N/A 04/07/2019   Procedure: CORONARY BALLOON ANGIOPLASTY;  Surgeon: Jared Mocha,  MD;  Location: [facility];  Service: Cardiovascular;  Laterality: N/A;  . CORONARY STENT INTERVENTION N/A 04/21/2019   Procedure: CORONARY STENT INTERVENTION;  Surgeon: Jared Blanks, MD;  Location: [facility];  Service: Cardiovascular;  Laterality: N/A;  . CYSTO/  CLOT EVACUATION POST PROSTATECTOMY  11-19-2005  . CYSTO/  REMOVAL URETHRAL STONE  AND URETHRAL STAPLES  06-02-2010  . CYSTO/  URETEROSCOPIC STONE EXTRACTION  X2  1990's  . KIDNEY STONE SURGERY    . LEFT HEART CATH AND CORONARY ANGIOGRAPHY N/A 02/25/2019   Procedure: LEFT HEART CATH AND CORONARY ANGIOGRAPHY;  Surgeon: Jared Hampshire, MD;  Location: [facility];  Service: Cardiovascular;  Laterality: N/A;  . LEFT HEART CATH AND CORS/GRAFTS ANGIOGRAPHY N/A 04/07/2019   Procedure: LEFT HEART CATH AND CORS/GRAFTS ANGIOGRAPHY;  Surgeon: Jared Mocha, MD;  Location: [facility];  Service: Cardiovascular;  Laterality: N/A;  . LEFT HEART CATH AND CORS/GRAFTS ANGIOGRAPHY N/A 04/21/2019   Procedure: LEFT HEART CATH AND CORS/GRAFTS ANGIOGRAPHY;  Surgeon: Jared Blanks, MD;  Location: [facility];  Service: Cardiovascular;  Laterality: N/A;  . MELANOMA EXCISION  2015   right ear  . ROBOT ASSISTED LAPAROSCOPIC RADICAL PROSTATECTOMY  11-09-2005  . SKIN CANCER EXCISION    . TEE WITHOUT CARDIOVERSION N/A 03/12/2019   Procedure: TRANSESOPHAGEAL ECHOCARDIOGRAM (TEE);  Surgeon: Jared Matte, MD;  Location: [facility];  Service: Open Heart Surgery;  Laterality: N/A;  . TONSILLECTOMY  child    Current Medications: Current Meds  Medication Sig  . ALPHA LIPOIC ACID PO Take by mouth daily.  . Ascorbic Acid (VITAMIN C PO) Take by mouth daily.  Jared Roberts aspirin EC 81 MG EC tablet Take 1 tablet (81 mg total) by mouth daily.  Jared Roberts atorvastatin (LIPITOR) 20 MG tablet Take 1 tablet (20 mg total) by mouth daily.  Jared Roberts BRILINTA 90 MG TABS tablet TAKE ONE TABLET BY MOUTH TWICE A DAY  . calcium carbonate (TUMS - DOSED IN MG ELEMENTAL CALCIUM) 500 MG chewable tablet Chew 2 tablets by mouth as needed for indigestion or heartburn.   . Cholecalciferol (VITAMIN D) 2000 units CAPS Take 2,000 Units by mouth 2 (two) times a day.   . CHROMIUM PO Take by mouth daily.  Jared Roberts FAMOTIDINE PO Take 10 mg by mouth as needed.  . Fish Oil-Cholecalciferol (FISH OIL + D3 PO) Take by mouth daily.  Jared Roberts Tea, Camellia sinensis, (GREEN TEA PO) Take by mouth daily.  Jared Roberts ibuprofen (ADVIL) 400 MG tablet Take 400 mg by mouth as needed for moderate pain.  . isosorbide mononitrate (IMDUR) 30 MG 24 hr tablet TAKE 1/2 TABLET BY MOUTH DAILY  . MAGNESIUM PO Take 300 mg by mouth daily.  . metoprolol tartrate (LOPRESSOR) 50 MG tablet Take 1 tablet (50 mg total) by mouth 3 (three) times daily.  . Multiple Vitamin (MULTIVITAMIN WITH MINERALS) TABS tablet Take 1 tablet by mouth daily.  . nitroGLYCERIN (NITROSTAT) 0.4 MG SL tablet Place 1 tablet (0.4 mg total) under the tongue every 5 (five) minutes x 3 doses as needed for chest pain.  . potassium chloride (KLOR-CON) 10 MEQ tablet TAKE ONE TABLET BY MOUTH DAILY  . Probiotic Product (PROBIOTIC DAILY PO) Take 1 capsule by mouth daily.      Allergies:   Patient has no known allergies.   Social History   Socioeconomic History  . Marital status: Married    Spouse name: [name]  . Number of children: 1  .  Years of education: 60  . Highest education level: Master's degree (e.g., MA, MS, MEng, MEd, MSW, MBA)  Occupational History  . Occupation: retired    Comment: from Manufacturing engineer  Tobacco Use  . Smoking status: Never Smoker  . Smokeless tobacco: Never Used  Vaping Use  . Vaping Use: Never used  Substance and Sexual Activity  . Alcohol use: Yes    Comment: 1 glass of wine daily  . Drug use: No  . Sexual activity: Not on file    Comment: retired, married. 1 son  Other Topics Concern  . Not on file  Social History Narrative  . Not on file   Social Determinants of Health   Financial Resource Strain: Not on file  Food Insecurity: Not on file  Transportation Needs: Not on file  Physical Activity: Not on file  Stress: Not on file  Social Connections: Not on file     Family History: The patient's family history includes Arthritis in his mother; Congestive Heart Failure in his mother; Diabetes Mellitus II in his mother; Osteoarthritis in his  mother; Other in his father.    ROS:   Please see the history of present illness.    All other systems reviewed and are negative.   EKGs/Labs/Other Studies Reviewed:    The following studies were reviewed today:  Echo 10/16/2019 1. Left ventricular ejection fraction, by estimation, is 60 to 65%. The  left ventricle has normal function. The left ventricle has no regional  wall motion abnormalities. There is mild left ventricular hypertrophy of  the basal-septal segment. Left  ventricular diastolic parameters are consistent with Grade I diastolic  dysfunction (impaired relaxation). The average left ventricular global  longitudinal strain is normal at -17.0 %.  2. Right ventricular systolic function is normal. The right ventricular  size is mildly enlarged. Tricuspid regurgitation signal is inadequate for  assessing PA pressure.  3. The mitral valve is normal in structure. Mild mitral valve  regurgitation. No evidence of mitral stenosis.  4. The aortic valve is tricuspid. Aortic valve regurgitation is not  visualized. Moderate aortic valve sclerosis/calcification is present,  without any evidence of aortic stenosis.  5. Aortic dilatation noted. There is mild dilatation of the aortic root  measuring 39 mm.  6. The inferior vena cava is normal in size with greater than 50%  respiratory variability, suggesting right atrial pressure of 3 mmHg.   EKG:  EKG is  ordered today.  The ekg ordered today demonstrates sinus bradycardia rate of 59 bpm  Recent Labs: 09/22/2019: ALT 18; BUN 10; Creatinine, Ser 1.33; Potassium 4.0; Sodium 137 12/10/2019: Hemoglobin 15.6; Platelets 313  Recent Lipid Panel    Component Value Date/Time   CHOL 157 12/15/2019 0941   TRIG 188 (H) 12/15/2019 0941   HDL 62 12/15/2019 0941   CHOLHDL 2.5 12/15/2019 0941   CHOLHDL 3.4 04/08/2019 0300   VLDL 37 04/08/2019 0300   LDLCALC 64 12/15/2019 0941     Physical Exam:    VS:  BP 122/72   Pulse 88   Ht 5'  10" (1.778 m)   Wt 181 lb 12.8 oz (82.5 kg)   SpO2 97%   BMI 26.09 kg/m     Wt Readings from Last 3 Encounters:  07/02/20 181 lb 12.8 oz (82.5 kg)  12/24/19 179 lb 4 oz (81.3 kg)  12/17/19 178 lb 9.6 oz (81 kg)     GEN: Well nourished, well developed in no acute distress HEENT: Normal NECK: No  JVD; No carotid bruits LYMPHATICS: No lymphadenopathy CARDIAC: RRR, no murmurs, rubs, gallops RESPIRATORY:  Clear to auscultation without rales, wheezing or rhonchi  ABDOMEN: Soft, non-tender, non-distended MUSCULOSKELETAL:  No edema; No deformity  SKIN: Warm and dry NEUROLOGIC:  Alert and oriented x 3 PSYCHIATRIC:  Normal affect   ASSESSMENT AND PLAN:    1. CAD No angina.  He is greater than 1 year post of his last PCI.  He held his dual antiplatelet therapy of aspirin and Brilinta July 2021 for urgent endoscopy and did well.  He is concerned about cost of Brilinta.  Will review antiplatelet therapy with Dr. Acie Roberts.  Could do monotherapy of Plavix instead of aspirin.  Continue statin and beta-blocker.  2. Hx of VF arrest 2nd to acute ischemia 04/07/19  -Continue beta-blocker  3. HTN -Blood pressure stable and well-controlled on current medication  4. HLD -Continue statin -12/15/2019: Cholesterol, Total 157; HDL 62; LDL Chol Calc (NIH) 64; Triglycerides 188  5.  Intermittent palpitation -Based on history it seems like due to caffeine use.  I have advised him to cut back on coffee.  He will take his morning dose of metoprolol at 7 AM for so it equally spread out. He will call us if interereted in monitor or ongoing symptoms.  - Continue BB - HR of 59 today.   Medication Adjustments/Labs and Tests Ordered: Current medicines are reviewed at length with the patient today.  Concerns regarding medicines are outlined above.  Orders Placed This Encounter  Procedures  . EKG 12-Lead   No orders of the defined types were placed in this encounter.   Patient Instructions  Medication  Instructions:  Your physician recommends that you continue on your current medications as directed. Please refer to the Current Medication list given to you today.  *If you need a refill on your cardiac medications before your next appointment, please call your pharmacy*   Lab Work: None ordered  If you have labs (blood work) drawn today and your tests are completely normal, you will receive your results only by: Jared Roberts MyChart Message (if you have MyChart) OR . A paper copy in the mail If you have any lab test that is abnormal or we need to change your treatment, we will call you to review the results.   Testing/Procedures: None ordered   Follow-Up: At [facility], you and your health needs are our priority.  As part of our continuing mission to provide you with exceptional heart care, we have created designated Provider Care Teams.  These Care Teams include your primary Cardiologist (physician) and Advanced Practice Providers (APPs -  Physician Assistants and Nurse Practitioners) who all work together to provide you with the care you need, when you need it.  We recommend signing up for the patient portal called "MyChart".  Sign up information is provided on this After Visit Summary.  MyChart is used to connect with patients for Virtual Visits (Telemedicine).  Patients are able to view lab/test results, encounter notes, upcoming appointments, etc.  Non-urgent messages can be sent to your provider as well.   To learn more about what you can do with MyChart, go to [email or web address].    Your next appointment:   6 month(s)  The format for your next appointment:   In Person  Provider:   You may see Mertie Moores, MD or one of the following Advanced Practice Providers on your designated Care Team:    Richardson Dopp, PA-C  [place], [place]  Other Instructions      Signed, Leanor Kail, [place]  07/02/2020 2:19 PM    [facility]

## 2020-07-02 ENCOUNTER — Encounter: Payer: Self-pay | Admitting: Physician Assistant

## 2020-07-02 ENCOUNTER — Ambulatory Visit: Payer: Medicare HMO | Admitting: Physician Assistant

## 2020-07-02 ENCOUNTER — Other Ambulatory Visit: Payer: Self-pay | Admitting: Cardiovascular Disease

## 2020-07-02 ENCOUNTER — Other Ambulatory Visit: Payer: Self-pay

## 2020-07-02 VITALS — BP 122/72 | HR 88 | Ht 70.0 in | Wt 181.8 lb

## 2020-07-02 DIAGNOSIS — E785 Hyperlipidemia, unspecified: Secondary | ICD-10-CM | POA: Diagnosis not present

## 2020-07-02 DIAGNOSIS — I10 Essential (primary) hypertension: Secondary | ICD-10-CM | POA: Diagnosis not present

## 2020-07-02 DIAGNOSIS — I2510 Atherosclerotic heart disease of native coronary artery without angina pectoris: Secondary | ICD-10-CM | POA: Diagnosis not present

## 2020-07-02 DIAGNOSIS — R002 Palpitations: Secondary | ICD-10-CM | POA: Diagnosis not present

## 2020-07-02 DIAGNOSIS — I119 Hypertensive heart disease without heart failure: Secondary | ICD-10-CM

## 2020-07-02 NOTE — Patient Instructions (Signed)

## 2020-07-18 ENCOUNTER — Other Ambulatory Visit: Payer: Self-pay | Admitting: Cardiovascular Disease

## 2020-07-20 ENCOUNTER — Other Ambulatory Visit: Payer: Self-pay | Admitting: *Deleted

## 2020-07-20 DIAGNOSIS — I6523 Occlusion and stenosis of bilateral carotid arteries: Secondary | ICD-10-CM

## 2020-07-27 ENCOUNTER — Other Ambulatory Visit: Payer: Self-pay | Admitting: Cardiovascular Disease

## 2020-08-02 ENCOUNTER — Ambulatory Visit: Payer: Medicare HMO | Admitting: Physician Assistant

## 2020-08-02 ENCOUNTER — Ambulatory Visit (HOSPITAL_COMMUNITY)
Admission: RE | Admit: 2020-08-02 | Discharge: 2020-08-02 | Disposition: A | Discharge status: Home / Self Care | Payer: Medicare HMO | Source: Ambulatory Visit | Attending: Physician Assistant | Admitting: Physician Assistant

## 2020-08-02 ENCOUNTER — Other Ambulatory Visit: Payer: Self-pay

## 2020-08-02 VITALS — BP 125/84 | HR 73 | Temp 98.5°F | Resp 18 | Ht 70.0 in | Wt 180.0 lb

## 2020-08-02 DIAGNOSIS — I6523 Occlusion and stenosis of bilateral carotid arteries: Secondary | ICD-10-CM | POA: Diagnosis not present

## 2020-08-02 NOTE — Progress Notes (Signed)
HISTORY AND PHYSICAL     CC:  follow up. Requesting Provider:  Deland Pretty, MD  HPI: This is a 75 y.o. male here for follow up for carotid artery stenosis.  He was originally seen by Dr. Donzetta Matters in October 2019 for evaluation of bilateral carotid artery stenosis and he was asymptomatic.  He did not have any intervention and advised to f/u in one year.   He was seen a little more than a year later and was asymptomatic from his carotid disease.  He did have hx of CAD with CABG x 3 in August 2020 and presented back in September via EMS with VF arrest.  He was found to have an occlusion of the saphenous vein graft to the distal Cx and probable embolization into the OM beyond graft site.  He had successful mechanical reperfusion with angioplasty.  His echo revealed preserved LVEF.  He was admitted a couple weeks later and had cardiac catheterization with DES x 1 to mid Cx along with balloon angioplasty to small caliber OM branch.  He was seen by cardiology in December 2021 and was doing well.   Pt was last seen December 2020 and at that time he was doing well without stroke sx.    Pt returns today for follow up.    Pt denies any amaurosis fugax, speech difficulties, weakness, numbness, paralysis or clumsiness or facial droop.    The pt is on a statin for cholesterol management.  The pt is on a daily aspirin.   Other AC:  Brilinta The pt is on BB for hypertension.   The pt is not diabetic.   Tobacco hx:  never  Pt does not have family hx of AAA.  Past Medical History:  Diagnosis Date  . Coronary artery disease   . DDD (degenerative disc disease), cervical   . Depression   . ED (erectile dysfunction) of organic origin   . Generalized osteoarthritis   . GERD (gastroesophageal reflux disease)   . Hiatal hernia   . History of chronic bronchitis   . History of gastritis    2003  . History of Helicobacter pylori infection    2003  . History of melanoma excision    2015--  right ear  .  History of prostate cancer urologist-  dr [name]   2007--  pT2a Nx Mx,  Gleason 3+3, S/P  PROSTATECTOMY  . History of squamous cell carcinoma excision    2011  left knee  . Hyperlipidemia   . Hyperplastic colon polyp 2009  . Hypertension   . Male hypogonadism   . Melanoma ([place])    left 4th toe  . Nephrolithiasis   . Prostate cancer ([place])   . Ventricular fibrillation ([place]) 04/07/2019   VF, en route witnessed by EMS, defibrillated x 1    Past Surgical History:  Procedure Laterality Date  . AMPUTATION TOE Left 09/07/2015   Procedure: PARTIAL AMPUTATION TOE 4TH LEFT;  Surgeon: [name], MD;  Location: [place];  Service: Podiatry;  Laterality: Left;  . APPENDECTOMY  1970's  . CORONARY ARTERY BYPASS GRAFT N/A 03/12/2019   Procedure: CORONARY ARTERY BYPASS GRAFTING (CABG) x 3 WITH ENDOSCOPIC HARVESTING OF RIGHT GREATER SAPHENOUS VEIN;  Surgeon: [name], MD;  Location: [place];  Service: Open Heart Surgery;  Laterality: N/A;  . CORONARY BALLOON ANGIOPLASTY N/A 04/07/2019   Procedure: CORONARY BALLOON ANGIOPLASTY;  Surgeon: [name], MD;  Location: [place] [facility];  Service: Cardiovascular;  Laterality: N/A;  .  CORONARY STENT INTERVENTION N/A 04/21/2019   Procedure: CORONARY STENT INTERVENTION;  Surgeon: [name], MD;  Location: [place] [facility];  Service: Cardiovascular;  Laterality: N/A;  . CYSTO/  CLOT EVACUATION POST PROSTATECTOMY  11-19-2005  . CYSTO/  REMOVAL URETHRAL STONE AND URETHRAL STAPLES  06-02-2010  . CYSTO/  URETEROSCOPIC STONE EXTRACTION  X2  1990's  . KIDNEY STONE SURGERY    . LEFT HEART CATH AND CORONARY ANGIOGRAPHY N/A 02/25/2019   Procedure: LEFT HEART CATH AND CORONARY ANGIOGRAPHY;  Surgeon: [name], MD;  Location: [facility];  Service: Cardiovascular;  Laterality: N/A;  . LEFT HEART CATH AND CORS/GRAFTS ANGIOGRAPHY N/A 04/07/2019   Procedure: LEFT HEART CATH AND CORS/GRAFTS ANGIOGRAPHY;  Surgeon:  [name], MD;  Location: [place];  Service: Cardiovascular;  Laterality: N/A;  . LEFT HEART CATH AND CORS/GRAFTS ANGIOGRAPHY N/A 04/21/2019   Procedure: LEFT HEART CATH AND CORS/GRAFTS ANGIOGRAPHY;  Surgeon: [name], MD;  Location: [place] [facility];  Service: Cardiovascular;  Laterality: N/A;  . MELANOMA EXCISION  2015   right ear  . ROBOT ASSISTED LAPAROSCOPIC RADICAL PROSTATECTOMY  11-09-2005  . SKIN CANCER EXCISION    . TEE WITHOUT CARDIOVERSION N/A 03/12/2019   Procedure: TRANSESOPHAGEAL ECHOCARDIOGRAM (TEE);  Surgeon: [name], MD;  Location: [place];  Service: Open Heart Surgery;  Laterality: N/A;  . TONSILLECTOMY  child    No Known Allergies  Current Outpatient Medications  Medication Sig Dispense Refill  . ALPHA LIPOIC ACID PO Take by mouth daily.    . Ascorbic Acid (VITAMIN C PO) Take by mouth daily.    Marland Kitchen aspirin EC 81 MG EC tablet Take 1 tablet (81 mg total) by mouth daily.    Marland Kitchen atorvastatin (LIPITOR) 20 MG tablet Take 1 tablet (20 mg total) by mouth daily. 90 tablet 3  . BRILINTA 90 MG TABS tablet TAKE ONE TABLET BY MOUTH TWICE A DAY 180 tablet 3  . calcium carbonate (TUMS - DOSED IN MG ELEMENTAL CALCIUM) 500 MG chewable tablet Chew 2 tablets by mouth as needed for indigestion or heartburn.     . Cholecalciferol (VITAMIN D) 2000 units CAPS Take 2,000 Units by mouth 2 (two) times a day.     . CHROMIUM PO Take by mouth daily.    Marland Kitchen FAMOTIDINE PO Take 10 mg by mouth as needed.    . Fish Oil-Cholecalciferol (FISH OIL + D3 PO) Take by mouth daily.    Nyoka Cowden Tea, Camellia sinensis, (GREEN TEA PO) Take by mouth daily.    . hydrochlorothiazide (HYDRODIURIL) 25 MG tablet TAKE ONE TABLET BY MOUTH DAILY 90 tablet 3  . ibuprofen (ADVIL) 400 MG tablet Take 400 mg by mouth as needed for moderate pain.    . isosorbide mononitrate (IMDUR) 30 MG 24 hr tablet TAKE 1/2 TABLET BY MOUTH DAILY 45 tablet 3  . losartan (COZAAR) 100 MG tablet TAKE ONE  TABLET BY MOUTH DAILY 90 tablet 3  . MAGNESIUM PO Take 300 mg by mouth daily.    . metoprolol tartrate (LOPRESSOR) 50 MG tablet Take 1 tablet (50 mg total) by mouth 3 (three) times daily. 270 tablet 3  . Multiple Vitamin (MULTIVITAMIN WITH MINERALS) TABS tablet Take 1 tablet by mouth daily.    . nitroGLYCERIN (NITROSTAT) 0.4 MG SL tablet Place 1 tablet (0.4 mg total) under the tongue every 5 (five) minutes x 3 doses as needed for chest pain. 25 tablet 0  . potassium chloride (  KLOR-CON) 10 MEQ tablet TAKE ONE TABLET BY MOUTH DAILY 90 tablet 2  . Probiotic Product (PROBIOTIC DAILY PO) Take 1 capsule by mouth daily.      No current facility-administered medications for this visit.    Family History  Problem Relation Age of Onset  . Arthritis Mother   . Diabetes Mellitus II Mother   . Congestive Heart Failure Mother   . Osteoarthritis Mother        CHRONIC  . Other Father        KILLED IN WWII    Social History   Socioeconomic History  . Marital status: Married    Spouse name: [name]  . Number of children: 1  . Years of education: 72  . Highest education level: Master's degree (e.g., MA, MS, MEng, MEd, MSW, MBA)  Occupational History  . Occupation: retired    Comment: from Manufacturing engineer  Tobacco Use  . Smoking status: Never Smoker  . Smokeless tobacco: Never Used  Vaping Use  . Vaping Use: Never used  Substance and Sexual Activity  . Alcohol use: Yes    Comment: 1 glass of wine daily  . Drug use: No  . Sexual activity: Not on file    Comment: retired, married. 1 son  Other Topics Concern  . Not on file  Social History Narrative  . Not on file   Social Determinants of Health   Financial Resource Strain: Not on file  Food Insecurity: Not on file  Transportation Needs: Not on file  Physical Activity: Not on file  Stress: Not on file  Social Connections: Not on file  Intimate Partner Violence: Not on file     REVIEW OF SYSTEMS:   [X]  denotes positive finding, [ ]   denotes negative finding Cardiac  Comments:  Chest pain or chest pressure:    Shortness of breath upon exertion:    Short of breath when lying flat:    Irregular heart rhythm:        Vascular    Pain in calf, thigh, or hip brought on by ambulation:    Pain in feet at night that wakes you up from your sleep:     Blood clot in your veins:    Leg swelling:         Pulmonary    Oxygen at home:    Productive cough:     Wheezing:         Neurologic    Sudden weakness in arms or legs:     Sudden numbness in arms or legs:     Sudden onset of difficulty speaking or slurred speech:    Temporary loss of vision in one eye:     Problems with dizziness:         Gastrointestinal    Blood in stool:     Vomited blood:         Genitourinary    Burning when urinating:     Blood in urine:        Psychiatric    Major depression:         Hematologic    Bleeding problems:    Problems with blood clotting too easily:        Skin    Rashes or ulcers:        Constitutional    Fever or chills:      PHYSICAL EXAMINATION:  Today's Vitals   08/02/20 1222 08/02/20 1229  BP: 128/87 125/84  Pulse: 69 73  Resp: 18   Temp: 98.5 F (36.9 C)   TempSrc: Temporal   SpO2: 95%   Weight: 180 lb (81.6 kg)   Height: 5\' 10"  (1.778 m)    Body mass index is 25.83 kg/m.   General:  WDWN in NAD; vital signs documented above Gait: Not observed HENT: WNL, normocephalic Pulmonary: normal non-labored breathing Cardiac: regular HR, without carotid bruits Abdomen: soft, NT, no masses; aortic pulse is not palpable Skin: without rashes Vascular Exam/Pulses:  Right Left  Radial 2+ (normal) 2+ (normal)  Ulnar 2+ (normal) 2+ (normal)  Popliteal Unable to palpate Unable to palpate  DP 2+ (normal) 2+ (normal)  PT 2+ (normal) 2+ (normal)   Extremities: without ischemic changes, without Gangrene , without cellulitis; without open wounds Musculoskeletal: no muscle wasting or atrophy  Neurologic: A&O  X 3 Psychiatric:  The pt has Normal affect.   Non-Invasive Vascular Imaging:   Carotid Duplex on 08/02/2020: Right:  1-39% ICA stenosis Left:  1-39% ICA stenosis Vertebrals: Bilateral vertebral arteries demonstrate antegrade flow.  Subclavians: Normal flow hemodynamics were seen in bilateral subclavian arteries.  Previous Carotid duplex on 07/03/2019: Right: 1-39% ICA stenosis Left:   1-39% ICA stenosis Vertebrals: Bilateral vertebral arteries demonstrate antegrade flow.  Subclavians: Normal flow hemodynamics were seen in bilateral subclavian arteries.    ASSESSMENT/PLAN:: 75 y.o. male here for follow up carotid artery stenosis.  -duplex today reveals 1-39% bilateral ICA stenosis.  Pt has remained 1-39% for the past couple of years.  Will have him return in one year with carotid duplex and if he remains 1-39% bilaterally, we can discuss going to 2 year follow up. -discussed s/s of stroke with pt and he understands should he develop any of these sx, he will go to the nearest ER. -pt will call sooner should they have any issues. -continue statin/asa   Leontine Locket, [facility] [phone]  Clinic MD:  Trula Slade

## 2020-08-04 DIAGNOSIS — D485 Neoplasm of uncertain behavior of skin: Secondary | ICD-10-CM | POA: Diagnosis not present

## 2020-08-04 DIAGNOSIS — L820 Inflamed seborrheic keratosis: Secondary | ICD-10-CM | POA: Diagnosis not present

## 2020-08-06 DIAGNOSIS — H5213 Myopia, bilateral: Secondary | ICD-10-CM | POA: Diagnosis not present

## 2020-08-06 DIAGNOSIS — H5310 Unspecified subjective visual disturbances: Secondary | ICD-10-CM | POA: Diagnosis not present

## 2020-08-06 DIAGNOSIS — R7989 Other specified abnormal findings of blood chemistry: Secondary | ICD-10-CM | POA: Diagnosis not present

## 2020-08-06 DIAGNOSIS — H2513 Age-related nuclear cataract, bilateral: Secondary | ICD-10-CM | POA: Diagnosis not present

## 2020-08-08 ENCOUNTER — Other Ambulatory Visit: Payer: Self-pay | Admitting: Cardiovascular Disease

## 2020-08-16 ENCOUNTER — Other Ambulatory Visit: Payer: Self-pay | Admitting: Cardiovascular Disease

## 2020-09-07 NOTE — Telephone Encounter (Signed)
Left message for the pt to call an make an appt with Dr. Acie Fredrickson or APP.

## 2020-09-08 ENCOUNTER — Encounter: Payer: Self-pay | Admitting: Cardiovascular Disease

## 2020-09-08 ENCOUNTER — Ambulatory Visit: Payer: Medicare HMO | Admitting: Cardiovascular Disease

## 2020-09-08 ENCOUNTER — Other Ambulatory Visit: Payer: Self-pay

## 2020-09-08 VITALS — BP 120/82 | HR 66 | Ht 70.0 in | Wt 177.8 lb

## 2020-09-08 DIAGNOSIS — R002 Palpitations: Secondary | ICD-10-CM | POA: Diagnosis not present

## 2020-09-08 MED ORDER — ISOSORBIDE MONONITRATE ER 30 MG PO TB24: 30.0000 mg | ORAL_TABLET | Freq: Every day | ORAL | 3 refills | Status: DC

## 2020-09-08 NOTE — Patient Instructions (Signed)
Medication Instructions:  Your physician has recommended you make the following change in your medication:  Take Imdur 30mg  daily   *If you need a refill on your cardiac medications before your next appointment, please call your pharmacy*   Lab Work: TODAY:BMET, Mg  If you have labs (blood work) drawn today and your tests are completely normal, you will receive your results only by: Marland Kitchen MyChart Message (if you have MyChart) OR . A paper copy in the mail If you have any lab test that is abnormal or we need to change your treatment, we will call you to review the results.   Testing/Procedures: none   Follow-Up: At [facility], you and your health needs are our priority.  As part of our continuing mission to provide you with exceptional heart care, we have created designated Provider Care Teams.  These Care Teams include your primary Cardiologist (physician) and Advanced Practice Providers (APPs -  Physician Assistants and Nurse Practitioners) who all work together to provide you with the care you need, when you need it.     Your next appointment:    10/29/20 at 1:45pm  The format for your next appointment:   In person    Provider:   Melina Copa

## 2020-09-08 NOTE — Progress Notes (Signed)
Cardiology Office Note:    Date:  09/08/2020   ID:  Jared Roberts, DOB [birth year], MRN [id number]  PCP:  Deland Pretty, MD  Cardiologist:  Bobbyjo Marulanda  Electrophysiologist:  None   Referring MD: Deland Pretty, MD   Chief Complaint  Patient presents with  . Coronary Artery Disease  . Hypertension    January 31, 2019    Jullian Clayson Mccalip is a 75 y.o. male with a hx of  HTN, TIA and carotid stenosis .  Has been having some chest pain  A week ago, mowed the lawn,  Had CP and profound fatigue. Upper left chest ,  Left shoulder and arm,  Left jaw.   Comes and goes ,  Last for seconds - minutes ,   Resolves if he stops to rest.  Associated with dyspnea but also has dyspnea on its own.  Doesn't exercise,  Works in the yard . Can climb 2 flights of stairs - makes him breath hard.   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  Non smoker, Drinks 1 drink per night   March 26, 2019:  Rush Landmark is seen back today for a follow-up office visit.  He recently had a heart catheterization which revealed severe coronary artery disease and he had coronary artery bypass grafting. BP has been variable Lower in the am Higher in the PM Cannot tell when his BP is high or low Chest is sore but no angina  Exercising some - walking around the house .   Has a referral for cardiac rehab  May 08, 2019: Rush Landmark is seen back today for follow-up of his coronary artery disease.  He status post coronary artery bypass grafting.  He had early graft failure and had a PCI of his left circumflex artery. He presented several weeks later with angina  and was found restenosis of his LCx.   He had stenting of his LCX and is now seen for follow up    Still is not feeling well Is really tired.  He is 8 weeks out from his CABG  1 month out from graft closure and PCI 2 weeks out from 2nd PCI .   No angina .   Has chest wall pain   Brought Pressure log with him.  His evening blood pressures were running  a little high.  We recently increased his Benicar up to 20 mg twice a day.  Has back pain since his cardiac arrest on Sept. 14.  Was given flexeril which helps but makes him sleepy .  Has developed double vision starting a week ago.   Has Right cranial nerve IV palsy .    Has continued back pain   May 09, 2019: Was seen yesterday late morning Left the office, went to [employer] very poorly  Took a nap.  Woke up,  HR was 120  Took a SL NTG .   Measured BP which was low  Took metoprolol 50 mg ,   Eventually felt better.  Review of labs from yesterday suggest volume depletion Hb is normal ECG is normal sinus rhythm  today  Will give him propranolol to take as needed.   June 26, 2019:  Overall doing well Rare episodes of CP  Woke up with GERD 2 nights ago. Took Tums  GERD symptoms returned.  Took NTG and pains resolved. Quickly  Has a hx of heartburn ,  This resolved after the CABG.  September 24, 2019: Rush Landmark  is seen back for follow-up visit. Thinks he is losing ground  Is not able to walk slowly No angina Has twinges.   No cp .   Is fatigued.  Does not have the energy to walk much or walk quickly  CABG was Aug. 19.  tnen had MI on Sept 14 and sept 28   Still having back pain which radiates around to his stomach   We discussed his lipid levels.  His triglyceride level had gone from 187 up to 285.  His LDL cholesterol was 57.  It turns out he had had a large fried seafood platter the week before his blood draw and also ate pizza day or so before his blood draw.  It turns out that he is tried rosuvastatin in the past but developed severe aches with that.  We tried him on atorvastatin and he developed severe muscle aches with that.  He is gradually worked his way down to 10 mg which he seems to be tolerating fairly well but it is unclear that that is going to provide any benefit from a cardiovascular risk standpoint.  We will refer him to the lipid clinic for  consideration for PCSK9 inhibitors.  December 24, 2019:  Rush Landmark is seen today for follow-up of his coronary artery disease.  He has had coronary artery bypass grafting.  He then has had 2 subsequent PCI's.  Had a stomach ache for a month.   Does not know the etiology  No cp , no dyspnea  Exercising a little.   Going for back PT I reviewed his recent labs - LDL is 64. Trigs are slighlty elevated at 157.   Feb. 16, 2022: Rush Landmark is seen today for questions about his BP .   Seen with wife , [name] .  He has had some arrhythmias and measured a HR of 30s this past week.  Has been having more CP in the evening ,  Started Imdur 30 mg in the am which has seemed to help reduce his NTG usage.    Has started going to [facility] and has been swimming - without angina  Has had some wheezing initially , which has resolved.  Swims [phone] yards per session .  One evening ( 6-8 hours after swimming) he noticed palps .   " hard heart beats .  HR with the pulse oximeter showed HR D To be 30s   ( symptoms sound like PVCs )    recorded an ECG on his Kardia   Sounds like he may be volume depleted.   His HR stays high for several hours after swimming       Past Medical History:  Diagnosis Date  . Coronary artery disease   . DDD (degenerative disc disease), cervical   . Depression   . ED (erectile dysfunction) of organic origin   . Generalized osteoarthritis   . GERD (gastroesophageal reflux disease)   . Hiatal hernia   . History of chronic bronchitis   . History of gastritis    2003  . History of Helicobacter pylori infection    2003  . History of melanoma excision    2015--  right ear  . History of prostate cancer urologist-  dr [name]   2007--  pT2a Nx Mx,  Gleason 3+3, S/P  PROSTATECTOMY  . History of squamous cell carcinoma excision    2011  left knee  . Hyperlipidemia   . Hyperplastic colon polyp 2009  . Hypertension   . Male  hypogonadism   . Melanoma ([place])    left 4th toe  . Nephrolithiasis    . Prostate cancer ([place])   . Ventricular fibrillation ([place]) 04/07/2019   VF, en route witnessed by EMS, defibrillated x 1    Past Surgical History:  Procedure Laterality Date  . AMPUTATION TOE Left 09/07/2015   Procedure: PARTIAL AMPUTATION TOE 4TH LEFT;  Surgeon: [name], MD;  Location: [facility];  Service: Podiatry;  Laterality: Left;  . APPENDECTOMY  1970's  . CORONARY ARTERY BYPASS GRAFT N/A 03/12/2019   Procedure: CORONARY ARTERY BYPASS GRAFTING (CABG) x 3 WITH ENDOSCOPIC HARVESTING OF RIGHT GREATER SAPHENOUS VEIN;  Surgeon: [name], MD;  Location: [facility];  Service: Open Heart Surgery;  Laterality: N/A;  . CORONARY BALLOON ANGIOPLASTY N/A 04/07/2019   Procedure: CORONARY BALLOON ANGIOPLASTY;  Surgeon: [name], MD;  Location: [facility];  Service: Cardiovascular;  Laterality: N/A;  . CORONARY STENT INTERVENTION N/A 04/21/2019   Procedure: CORONARY STENT INTERVENTION;  Surgeon: [name], MD;  Location: [facility];  Service: Cardiovascular;  Laterality: N/A;  . CYSTO/  CLOT EVACUATION POST PROSTATECTOMY  11-19-2005  . CYSTO/  REMOVAL URETHRAL STONE AND URETHRAL STAPLES  06-02-2010  . CYSTO/  URETEROSCOPIC STONE EXTRACTION  X2  1990's  . KIDNEY STONE SURGERY    . LEFT HEART CATH AND CORONARY ANGIOGRAPHY N/A 02/25/2019   Procedure: LEFT HEART CATH AND CORONARY ANGIOGRAPHY;  Surgeon: [name], MD;  Location: [facility];  Service: Cardiovascular;  Laterality: N/A;  . LEFT HEART CATH AND CORS/GRAFTS ANGIOGRAPHY N/A 04/07/2019   Procedure: LEFT HEART CATH AND CORS/GRAFTS ANGIOGRAPHY;  Surgeon: [name], MD;  Location: [facility];  Service: Cardiovascular;  Laterality: N/A;  . LEFT HEART CATH AND CORS/GRAFTS ANGIOGRAPHY N/A 04/21/2019   Procedure: LEFT HEART CATH AND CORS/GRAFTS ANGIOGRAPHY;  Surgeon: [name], MD;  Location: [facility];  Service: Cardiovascular;  Laterality:  N/A;  . MELANOMA EXCISION  2015   right ear  . ROBOT ASSISTED LAPAROSCOPIC RADICAL PROSTATECTOMY  11-09-2005  . SKIN CANCER EXCISION    . TEE WITHOUT CARDIOVERSION N/A 03/12/2019   Procedure: TRANSESOPHAGEAL ECHOCARDIOGRAM (TEE);  Surgeon: [name], MD;  Location: [facility];  Service: Open Heart Surgery;  Laterality: N/A;  . TONSILLECTOMY  child    Current Medications: Current Meds  Medication Sig  . ALPHA LIPOIC ACID PO Take by mouth daily.  . Ascorbic Acid (VITAMIN C PO) Take by mouth daily.  Marland Kitchen aspirin EC 81 MG EC tablet Take 1 tablet (81 mg total) by mouth daily.  Marland Kitchen atorvastatin (LIPITOR) 20 MG tablet Take 1 tablet (20 mg total) by mouth daily.  Marland Kitchen BRILINTA 90 MG TABS tablet TAKE ONE TABLET BY MOUTH TWICE A DAY  . calcium carbonate (TUMS - DOSED IN MG ELEMENTAL CALCIUM) 500 MG chewable tablet Chew 2 tablets by mouth as needed for indigestion or heartburn.   . Cholecalciferol (VITAMIN D) 2000 units CAPS Take 2,000 Units by mouth 2 (two) times a day.   . CHROMIUM PO Take by mouth daily.  . famotidine (PEPCID) 20 MG tablet Take 1 tablet by mouth at bedtime.  . Fish Oil-Cholecalciferol (FISH OIL + D3 PO) Take by mouth daily.  Nyoka Cowden Tea, Camellia sinensis, (GREEN TEA PO) Take by mouth daily.  . hydrochlorothiazide (HYDRODIURIL) 25 MG tablet TAKE ONE TABLET BY MOUTH DAILY  . ibuprofen (ADVIL) 400 MG tablet Take 400 mg  by mouth as needed for moderate pain.  . isosorbide mononitrate (IMDUR) 30 MG 24 hr tablet Take 1 tablet (30 mg total) by mouth daily.  Marland Kitchen losartan (COZAAR) 100 MG tablet TAKE ONE TABLET BY MOUTH DAILY  . MAGNESIUM PO Take 300 mg by mouth daily.  . metoprolol tartrate (LOPRESSOR) 50 MG tablet Take 75 mg by mouth 2 (two) times daily.  . Multiple Vitamin (MULTIVITAMIN WITH MINERALS) TABS tablet Take 1 tablet by mouth daily.  . nitroGLYCERIN (NITROSTAT) 0.4 MG SL tablet DISSOLVE 1 TAB UNDER TONGUE FOR CHEST PAIN - IF PAIN REMAINS AFTER 5 MIN, CALL 911 AND REPEAT DOSE.  MAX 3 TABS IN 15 MINUTES  . potassium chloride (KLOR-CON) 10 MEQ tablet TAKE ONE TABLET BY MOUTH DAILY  . Probiotic Product (PROBIOTIC DAILY PO) Take 1 capsule by mouth daily.   . [DISCONTINUED] isosorbide mononitrate (IMDUR) 30 MG 24 hr tablet TAKE 1/2 TABLET BY MOUTH DAILY (Patient taking differently: 30 mg.)  . [DISCONTINUED] isosorbide mononitrate (IMDUR) 30 MG 24 hr tablet Take 30 mg by mouth daily.  . [DISCONTINUED] metoprolol tartrate (LOPRESSOR) 50 MG tablet TAKE ONE TABLET BY MOUTH THREE TIMES A DAY (Patient taking differently: Take 75 mg by mouth 2 (two) times daily.)     Allergies:   Patient has no known allergies.   Social History   Socioeconomic History  . Marital status: Married    Spouse name: [name]  . Number of children: 1  . Years of education: 18  . Highest education level: Master's degree (e.g., MA, MS, MEng, MEd, MSW, MBA)  Occupational History  . Occupation: retired    Comment: from Manufacturing engineer  Tobacco Use  . Smoking status: Never Smoker  . Smokeless tobacco: Never Used  Vaping Use  . Vaping Use: Never used  Substance and Sexual Activity  . Alcohol use: Yes    Comment: 1 glass of wine daily  . Drug use: No  . Sexual activity: Not on file    Comment: retired, married. 1 son  Other Topics Concern  . Not on file  Social History Narrative  . Not on file   Social Determinants of Health   Financial Resource Strain: Not on file  Food Insecurity: Not on file  Transportation Needs: Not on file  Physical Activity: Not on file  Stress: Not on file  Social Connections: Not on file     Family History: The patient's family history includes Arthritis in his mother; Congestive Heart Failure in his mother; Diabetes Mellitus II in his mother; Osteoarthritis in his mother; Other in his father.  ROS:   Please see the history of present illness.     All other systems reviewed and are negative.  EKGs/Labs/Other Studies Reviewed:    The following studies were  reviewed today:   EKG:     Recent Labs: 09/22/2019: ALT 18; BUN 10; Creatinine, Ser 1.33; Potassium 4.0; Sodium 137 12/10/2019: Hemoglobin 15.6; Platelets 313  Recent Lipid Panel    Component Value Date/Time   CHOL 157 12/15/2019 0941   TRIG 188 (H) 12/15/2019 0941   HDL 62 12/15/2019 0941   CHOLHDL 2.5 12/15/2019 0941   CHOLHDL 3.4 04/08/2019 0300   VLDL 37 04/08/2019 0300   LDLCALC 64 12/15/2019 0941    Physical Exam:    Physical Exam: Blood pressure 120/82, pulse 66, height 5\' 10"  (1.778 m), weight 177 lb 12.8 oz (80.6 kg), SpO2 96 %.  GEN:  Well nourished, well developed in no acute distress HEENT:  Normal NECK: No JVD; No carotid bruits LYMPHATICS: No lymphadenopathy CARDIAC: RRR , no murmurs, rubs, gallops RESPIRATORY:  Clear to auscultation without rales, wheezing or rhonchi  ABDOMEN: Soft, non-tender, non-distended MUSCULOSKELETAL:  No edema; No deformity  SKIN: Warm and dry NEUROLOGIC:  Alert and oriented x 3     ASSESSMENT:    1. Palpitations    PLAN:      CAD :     Angina seems to be better after increasing his isosorbide to 30 mg a day.  We will continue with his current dose of isosorbide 30 mg a day.  2.  Palpitations: He is having some palpitations.  These clinically sound like premature ventricular contractions.  He was able to record some EKG readings on his cardia monitor.  I will ask him to send those in through his MyChart.  We will check a basic metabolic profile and magnesium level today.  2.  HTN: Blood pressure seems to be fairly well controlled at this time.  3.  Hyperlipidemia:    Continue current medications. 4.   Back pain   .     Medication Adjustments/Labs and Tests Ordered: Current medicines are reviewed at length with the patient today.  Concerns regarding medicines are outlined above.  Orders Placed This Encounter  Procedures  . Basic metabolic panel  . Magnesium   Meds ordered this encounter  Medications  . isosorbide  mononitrate (IMDUR) 30 MG 24 hr tablet    Sig: Take 1 tablet (30 mg total) by mouth daily.    Dispense:  90 tablet    Refill:  3     There are no Patient Instructions on file for this visit.   Signed, Mertie Moores, MD  09/08/2020 4:21 PM    [facility]

## 2020-09-08 NOTE — Telephone Encounter (Signed)
Appointment scheduled for 09/08/2020 at 320pm with Dr Acie Fredrickson.  Pt is aware.

## 2020-09-09 LAB — MAGNESIUM: Magnesium: 2.2 mg/dL (ref 1.6–2.3)

## 2020-09-09 LAB — BASIC METABOLIC PANEL
BUN/Creatinine Ratio: 13 (ref 10–24)
BUN: 16 mg/dL (ref 8–27)
CO2: 19 mmol/L — ABNORMAL LOW (ref 20–29)
Calcium: 10.3 mg/dL — ABNORMAL HIGH (ref 8.6–10.2)
Chloride: 102 mmol/L (ref 96–106)
Creatinine, Ser: 1.22 mg/dL (ref 0.76–1.27)
GFR calc Af Amer: 66 mL/min/{1.73_m2} (ref >59)
GFR calc non Af Amer: 57 mL/min/{1.73_m2} — ABNORMAL LOW (ref >59)
Glucose: 98 mg/dL (ref 65–99)
Potassium: 4 mmol/L (ref 3.5–5.2)
Sodium: 140 mmol/L (ref 134–144)

## 2020-09-15 MED ORDER — CLOPIDOGREL BISULFATE 75 MG PO TABS
75.0000 mg | ORAL_TABLET | Freq: Every day | ORAL | 3 refills | Status: DC
Start: 2020-09-15 — End: 2021-09-05

## 2020-09-17 DIAGNOSIS — D2261 Melanocytic nevi of right upper limb, including shoulder: Secondary | ICD-10-CM | POA: Diagnosis not present

## 2020-09-17 DIAGNOSIS — Z85828 Personal history of other malignant neoplasm of skin: Secondary | ICD-10-CM | POA: Diagnosis not present

## 2020-09-17 DIAGNOSIS — L821 Other seborrheic keratosis: Secondary | ICD-10-CM | POA: Diagnosis not present

## 2020-09-17 DIAGNOSIS — L570 Actinic keratosis: Secondary | ICD-10-CM | POA: Diagnosis not present

## 2020-09-17 DIAGNOSIS — D2271 Melanocytic nevi of right lower limb, including hip: Secondary | ICD-10-CM | POA: Diagnosis not present

## 2020-09-17 DIAGNOSIS — L578 Other skin changes due to chronic exposure to nonionizing radiation: Secondary | ICD-10-CM | POA: Diagnosis not present

## 2020-09-17 DIAGNOSIS — Z85820 Personal history of malignant melanoma of skin: Secondary | ICD-10-CM | POA: Diagnosis not present

## 2020-09-17 DIAGNOSIS — D2371 Other benign neoplasm of skin of right lower limb, including hip: Secondary | ICD-10-CM | POA: Diagnosis not present

## 2020-10-12 ENCOUNTER — Other Ambulatory Visit: Payer: Self-pay | Admitting: Cardiovascular Disease

## 2020-10-26 ENCOUNTER — Other Ambulatory Visit: Payer: Self-pay

## 2020-10-26 ENCOUNTER — Ambulatory Visit: Payer: Medicare HMO | Attending: Internal Medicine

## 2020-10-26 DIAGNOSIS — Z23 Encounter for immunization: Secondary | ICD-10-CM

## 2020-10-26 DIAGNOSIS — I10 Essential (primary) hypertension: Secondary | ICD-10-CM | POA: Diagnosis not present

## 2020-10-26 DIAGNOSIS — E7800 Pure hypercholesterolemia, unspecified: Secondary | ICD-10-CM | POA: Diagnosis not present

## 2020-10-26 DIAGNOSIS — Z125 Encounter for screening for malignant neoplasm of prostate: Secondary | ICD-10-CM | POA: Diagnosis not present

## 2020-10-27 ENCOUNTER — Encounter: Payer: Self-pay | Admitting: Physician Assistant

## 2020-10-27 NOTE — Progress Notes (Signed)
Cardiology Office Note    Date:  10/29/2020   ID:  Jared Roberts, DOB 10/02/1945, MRN [id number]  PCP:  Jared Pretty, MD  Cardiologist:  Mertie Moores, MD  Electrophysiologist:  None   Chief Complaint: f/u palpitations  History of Present Illness:   Jared Roberts is a 75 y.o. male with history of CAD (s/p CABG 02/2019 with  LIMA-LAD, SVG-OM, SVG-PDA, NSTEMI/VF arrest 03/2019 felt due to early graft failure s/p PTCA to distal Cx/OM2, then repeat PCI later 03/2019 with DES to [facility] and PTCA to OM branch), right cranial nerve VI palsy, HTN, HLD (myalgias on statins), TIA, PVCs, prostate CA, mild dilation of aortic root by echo 09/2019, mild 1-39% BICA stenosis 07/2020, probable mild CKD IIIa by labs who presents for follow-up.  He established care with Dr. Acie Fredrickson in 01/2019 for symptoms concerning for angina. Coronary CTA was abnormal prompting cath demonstrating MVCAD. He underwent CABG as above in 02/2019. He presented back to [facility] 09/14/2020via EMS w/ VF arrest. He was administered a single shock at 200J and NSR was restored. Cath showed occlusion of the saphenous vein graft to the distal circumflex and probable embolization into the obtuse marginal beyond the graft insertion site.He had successful mechanical reperfusion with PTCA only of the distal Cx/OM2 secondary to smallvessel size.Echo showed preserved LVEF.VF was felt related to acute ischemia, felt no indication for ischemia. He then returned to the hospital later that month with recurrent chest pain resulting in DES to [facility] and PTCA of OM branch. EF normal at that time. Last echo 09/2019 showed EF 60-65%, mild LVH of basal-septal segment, grade 1 DD, mild MR, mild dilation of the aortic root. He was seen by Dr. Acie Fredrickson in February with palpitations. His pulse ox was picking up a HR in the 30s. Per MyChart messages, his Brilinta and aspirin were stopped and he was changed to Plavix due to cost. He had sent in [place] tracings showing PVCs. No  further changes advised at that time; Dr. Acie Fredrickson felt reassuring.  He is seen back for follow-up today with his wife. His PVCs have subsided. He swims regularly at [facility] and has not had any recent anginal or dyspnea. No dizziness or syncope. He does show me his [place] tracings that he was doing back in February. Some of them appear to show bradycardia in the low 40s (I.e.42bpm). It looks like sinus bradycardia but there is some baseline artifact making interpretation challenging. He states he would get the [facility] when he would notice his HR registering low by pulse ox, not necessarily prompted by symptoms. Overall he feels that he is doing well.  Labwork independently reviewed: 10/2020 K 3.9, Cr 1.15, Cr 9.4, normal LFTs, Hgb 16.4, LDL 58 (pt has portal) 08/2020 Mg 2.2, K 4.0, calcium 10.3, Cr 1.22 11/2019 LDL 64, trig 188 11/2019 Hgb 15.6  Past Medical History:  Diagnosis Date  . Carotid artery disease ([place])   . CKD (chronic kidney disease), stage III ([place])   . Coronary artery disease    s/p CABG 02/2009 with  LIMA-LAD, SVG-OM, SVG-PDA, NSTEMI/VF arrest 03/2019 felt due to early graft failure s/p PTCA to distal Cx/OM2, then repeat PCI later 03/2019 with DES to mLCx and PTCA to OM branch  . Cranial nerve VI palsy   . DDD (degenerative disc disease), cervical   . Depression   . Dilated aortic root ([place])   . ED (erectile dysfunction) of organic origin   . Generalized osteoarthritis   . GERD (  gastroesophageal reflux disease)   . Hiatal hernia   . History of chronic bronchitis   . History of gastritis    2003  . History of Helicobacter pylori infection    2003  . History of melanoma excision    2015--  right ear  . History of prostate cancer urologist-  dr [name]   2007--  pT2a Nx Mx,  Gleason 3+3, S/P  PROSTATECTOMY  . History of squamous cell carcinoma excision    2011  left knee  . Hyperlipidemia   . Hyperplastic colon polyp 2009  . Hypertension   . Male hypogonadism   . Melanoma  ([place])    left 4th toe  . Nephrolithiasis   . Prostate cancer ([place])   . Ventricular fibrillation ([place]) 04/07/2019   VF, en route witnessed by EMS, defibrillated x 1    Past Surgical History:  Procedure Laterality Date  . AMPUTATION TOE Left 09/07/2015   Procedure: PARTIAL AMPUTATION TOE 4TH LEFT;  Surgeon: [name], MD;  Location: [place];  Service: Podiatry;  Laterality: Left;  . APPENDECTOMY  1970's  . CORONARY ARTERY BYPASS GRAFT N/A 03/12/2019   Procedure: CORONARY ARTERY BYPASS GRAFTING (CABG) x 3 WITH ENDOSCOPIC HARVESTING OF RIGHT GREATER SAPHENOUS VEIN;  Surgeon: [name], MD;  Location: [place];  Service: Open Heart Surgery;  Laterality: N/A;  . CORONARY BALLOON ANGIOPLASTY N/A 04/07/2019   Procedure: CORONARY BALLOON ANGIOPLASTY;  Surgeon: [name], MD;  Location: [place] [facility];  Service: Cardiovascular;  Laterality: N/A;  . CORONARY STENT INTERVENTION N/A 04/21/2019   Procedure: CORONARY STENT INTERVENTION;  Surgeon: [name], MD;  Location: [place] [facility];  Service: Cardiovascular;  Laterality: N/A;  . CYSTO/  CLOT EVACUATION POST PROSTATECTOMY  11-19-2005  . CYSTO/  REMOVAL URETHRAL STONE AND URETHRAL STAPLES  06-02-2010  . CYSTO/  URETEROSCOPIC STONE EXTRACTION  X2  1990's  . KIDNEY STONE SURGERY    . LEFT HEART CATH AND CORONARY ANGIOGRAPHY N/A 02/25/2019   Procedure: LEFT HEART CATH AND CORONARY ANGIOGRAPHY;  Surgeon: [name], MD;  Location: [place] [facility];  Service: Cardiovascular;  Laterality: N/A;  . LEFT HEART CATH AND CORS/GRAFTS ANGIOGRAPHY N/A 04/07/2019   Procedure: LEFT HEART CATH AND CORS/GRAFTS ANGIOGRAPHY;  Surgeon: [name], MD;  Location: [place] [facility];  Service: Cardiovascular;  Laterality: N/A;  . LEFT HEART CATH AND CORS/GRAFTS ANGIOGRAPHY N/A 04/21/2019   Procedure: LEFT HEART CATH AND CORS/GRAFTS ANGIOGRAPHY;  Surgeon: [name], MD;  Location: [place] [facility];   Service: Cardiovascular;  Laterality: N/A;  . MELANOMA EXCISION  2015   right ear  . ROBOT ASSISTED LAPAROSCOPIC RADICAL PROSTATECTOMY  11-09-2005  . SKIN CANCER EXCISION    . TEE WITHOUT CARDIOVERSION N/A 03/12/2019   Procedure: TRANSESOPHAGEAL ECHOCARDIOGRAM (TEE);  Surgeon: [name], MD;  Location: [employer];  Service: Open Heart Surgery;  Laterality: N/A;  . TONSILLECTOMY  child    Current Medications: Current Meds  Medication Sig  . ALPHA LIPOIC ACID PO Take by mouth daily.  . Ascorbic Acid (VITAMIN C PO) Take by mouth daily.  Marland Kitchen atorvastatin (LIPITOR) 20 MG tablet TAKE ONE TABLET BY MOUTH DAILY  . calcium carbonate (TUMS - DOSED IN MG ELEMENTAL CALCIUM) 500 MG chewable tablet Chew 2 tablets by mouth as needed for indigestion or heartburn.   . Cholecalciferol (VITAMIN D) 2000 units CAPS Take 2,000 Units by mouth 2 (two) times a day.   Marland Kitchen  CHROMIUM PO Take by mouth daily.  . clopidogrel (PLAVIX) 75 MG tablet Take 1 tablet (75 mg total) by mouth daily.  . famotidine (PEPCID) 40 MG tablet Take 40 mg by mouth daily. Per patient taking 1/2 tablet of the 40 mg tabs.  . Fish Oil-Cholecalciferol (FISH OIL + D3 PO) Take by mouth daily.  Nyoka Cowden Tea, Camellia sinensis, (GREEN TEA PO) Take by mouth daily.  . hydrochlorothiazide (HYDRODIURIL) 25 MG tablet TAKE ONE TABLET BY MOUTH DAILY  . ibuprofen (ADVIL) 400 MG tablet Take 400 mg by mouth as needed for moderate pain.  . isosorbide mononitrate (IMDUR) 30 MG 24 hr tablet Take 1 tablet (30 mg total) by mouth daily.  Marland Kitchen losartan (COZAAR) 100 MG tablet TAKE ONE TABLET BY MOUTH DAILY  . MAGNESIUM PO Take 300 mg by mouth daily.  . metoprolol tartrate (LOPRESSOR) 50 MG tablet Take 1 tablet (50 mg total) by mouth 2 (two) times daily.  . Multiple Vitamin (MULTIVITAMIN WITH MINERALS) TABS tablet Take 1 tablet by mouth daily.  . nitroGLYCERIN (NITROSTAT) 0.4 MG SL tablet DISSOLVE 1 TAB UNDER TONGUE FOR CHEST PAIN - IF PAIN REMAINS AFTER 5 MIN, CALL  911 AND REPEAT DOSE. MAX 3 TABS IN 15 MINUTES  . potassium chloride (KLOR-CON) 10 MEQ tablet TAKE ONE TABLET BY MOUTH DAILY  . Probiotic Product (PROBIOTIC DAILY PO) Take 1 capsule by mouth daily.   . [DISCONTINUED] metoprolol tartrate (LOPRESSOR) 50 MG tablet Take 75 mg by mouth 2 (two) times daily.      Allergies:   Patient has no known allergies.   Social History   Socioeconomic History  . Marital status: Married    Spouse name: [name]  . Number of children: 1  . Years of education: 28  . Highest education level: Master's degree (e.g., MA, MS, MEng, MEd, MSW, MBA)  Occupational History  . Occupation: retired    Comment: from Manufacturing engineer  Tobacco Use  . Smoking status: Never Smoker  . Smokeless tobacco: Never Used  Vaping Use  . Vaping Use: Never used  Substance and Sexual Activity  . Alcohol use: Yes    Comment: 1 glass of wine daily  . Drug use: No  . Sexual activity: Not on file    Comment: retired, married. 1 son  Other Topics Concern  . Not on file  Social History Narrative  . Not on file   Social Determinants of Health   Financial Resource Strain: Not on file  Food Insecurity: Not on file  Transportation Needs: Not on file  Physical Activity: Not on file  Stress: Not on file  Social Connections: Not on file     Family History:  The patient's family history includes Arthritis in his mother; Congestive Heart Failure in his mother; Diabetes Mellitus II in his mother; Osteoarthritis in his mother; Other in his father.  ROS:   Please see the history of present illness. ROS + chronic back issues. Contemplating kyphoplasty but will w/u monitor first. All other systems are reviewed and otherwise negative.    EKGs/Labs/Other Studies Reviewed:    Studies reviewed are outlined and summarized above. Reports included below if pertinent.  Last echo 09/2019  1. Left ventricular ejection fraction, by estimation, is 60 to 65%. The  left ventricle has normal  function. The left ventricle has no regional  wall motion abnormalities. There is mild left ventricular hypertrophy of  the basal-septal segment. Left  ventricular diastolic parameters are consistent with Grade I diastolic  dysfunction (impaired relaxation). The average left ventricular global  longitudinal strain is normal at -17.0 %.  2. Right ventricular systolic function is normal. The right ventricular  size is mildly enlarged. Tricuspid regurgitation signal is inadequate for  assessing PA pressure.  3. The mitral valve is normal in structure. Mild mitral valve  regurgitation. No evidence of mitral stenosis.  4. The aortic valve is tricuspid. Aortic valve regurgitation is not  visualized. Moderate aortic valve sclerosis/calcification is present,  without any evidence of aortic stenosis.  5. Aortic dilatation noted. There is mild dilatation of the aortic root  measuring 39 mm.  6. The inferior vena cava is normal in size with greater than 50%  respiratory variability, suggesting right atrial pressure of 3 mmHg.   Last cath 03/2019  Prox LAD to Mid LAD lesion is 60% stenosed.  Mid LAD lesion is 80% stenosed.  Dist Cx lesion is 95% stenosed.  Balloon angioplasty was performed.  Prox RCA lesion is 90% stenosed.  Origin to Prox Graft lesion is 100% stenosed.  SVG.  LIMA and is normal in caliber.  Graft was not visualized.  Prox Cx to Mid Cx lesion is 80% stenosed.  The left ventricular systolic function is normal.  LV end diastolic pressure is normal.  The left ventricular ejection fraction is 55-65% by visual estimate.  There is no mitral valve regurgitation.   1. Triple vessel CAD s/p 3V CABG with 2/3 patent bypass grafts 2. Severe stenosis mid LAD. Patent LIMA graft to LAD 3. Severe stenosis mid Circumflex. Severe stenosis in the very small caliber first obtuse marginal branch at the site of the vein graft anastomosis. Occluded vein graft to SVG (known, not  injected today).  4. Successful DES x 1 mid Circumflex.  5. Successful balloon angioplasty small caliber obtuse marginal branch 6. Severe stenosis proximal RCA. Patent vein graft to PDA.  7. Normal LV systolic function  Recommendations: ASA and Brilinta for one year. Continue statin and beta blocker. Will add a long acting nitrate.       EKG:  EKG is ordered today, personally reviewed, demonstrating NSR 70bpm, nonspecific changes in III, avF   Recent Labs: 12/10/2019: Hemoglobin 15.6; Platelets 313 09/08/2020: BUN 16; Creatinine, Ser 1.22; Magnesium 2.2; Potassium 4.0; Sodium 140  Recent Lipid Panel    Component Value Date/Time   CHOL 157 12/15/2019 0941   TRIG 188 (H) 12/15/2019 0941   HDL 62 12/15/2019 0941   CHOLHDL 2.5 12/15/2019 0941   CHOLHDL 3.4 04/08/2019 0300   VLDL 37 04/08/2019 0300   LDLCALC 64 12/15/2019 0941    PHYSICAL EXAM:    VS:  BP 126/84   Pulse 78   Ht 5\' 10"  (1.778 m)   Wt 176 lb 12.8 oz (80.2 kg)   SpO2 96%   BMI 25.37 kg/m   BMI: Body mass index is 25.37 kg/m.  GEN: Well nourished, well developed male in no acute distress HEENT: normocephalic, atraumatic Neck: no JVD, carotid bruits, or masses Cardiac: RRR; no murmurs, rubs, or gallops, no edema  Respiratory:  clear to auscultation bilaterally, normal work of breathing GI: soft, nontender, nondistended, + BS MS: no deformity or atrophy Skin: warm and dry, no rash Neuro:  Alert and Oriented x 3, Strength and sensation are intact, follows commands Psych: euthymic mood, full affect  Wt Readings from Last 3 Encounters:  10/29/20 176 lb 12.8 oz (80.2 kg)  09/08/20 177 lb 12.8 oz (80.6 kg)  08/02/20 180 lb (81.6 kg)  ASSESSMENT & PLAN:   1. PVCs, also bradycardia by Jodelle Red monitor - his symptoms of PVCs have subsided since last month so he hasn't really been following his HR as much as he was before. At last visit, it was thought the bradycardia he was registering was related to PVCs, he  does appear to have tracings that show what looks like sinus bradycardia in the low 40s as well. This is when is awake. We do not have a good sense of how often this is happening but since it was proven during daytime hours it seems prudent to decrease metoprolol to 50mg  BID. We will have to see how this impacts his PVCs symptomatically. We will order a 2 week Zio XT to assess both bradycardia and PVC burden. Will review with MA regarding swim instructions with monitor. Will also check Mg, TSH. BMET reviewed from recent PCP office.  2. CAD s/p CABG, PCIs with hx of HLD goal LDL <70 - doing well without recent angina. Now on Plavix monotherapy by Dr. Acie Fredrickson. See above regarding BB. Lipids are at goal by review of recent PCP labs.  3. Dilated aortic root - due for re-assessment by echocardiogram. Follow BP, currently controlled.  4. Essential HTN - currently within goal. Follow with med change above. Check TSH.  Disposition: F/u with me in 6 weeks.  Medication Adjustments/Labs and Tests Ordered: Current medicines are reviewed at length with the patient today.  Concerns regarding medicines are outlined above. Medication changes, Labs and Tests ordered today are summarized above and listed in the Patient Instructions accessible in Encounters.   Signed, Charlie Pitter, PA-C  10/29/2020 2:44 PM    [facility] [address], [place], [place]  [address] Phone: [phone]; Fax: [phone]

## 2020-10-29 ENCOUNTER — Encounter: Payer: Self-pay | Admitting: Physician Assistant

## 2020-10-29 ENCOUNTER — Ambulatory Visit: Payer: Medicare HMO | Admitting: Physician Assistant

## 2020-10-29 ENCOUNTER — Other Ambulatory Visit: Payer: Self-pay

## 2020-10-29 ENCOUNTER — Other Ambulatory Visit (HOSPITAL_BASED_OUTPATIENT_CLINIC_OR_DEPARTMENT_OTHER): Payer: Self-pay

## 2020-10-29 ENCOUNTER — Ambulatory Visit: Payer: Medicare HMO

## 2020-10-29 VITALS — BP 126/84 | HR 78 | Ht 70.0 in | Wt 176.8 lb

## 2020-10-29 DIAGNOSIS — I493 Ventricular premature depolarization: Secondary | ICD-10-CM

## 2020-10-29 DIAGNOSIS — R001 Bradycardia, unspecified: Secondary | ICD-10-CM | POA: Diagnosis not present

## 2020-10-29 DIAGNOSIS — E785 Hyperlipidemia, unspecified: Secondary | ICD-10-CM

## 2020-10-29 DIAGNOSIS — I10 Essential (primary) hypertension: Secondary | ICD-10-CM | POA: Diagnosis not present

## 2020-10-29 DIAGNOSIS — I77810 Thoracic aortic ectasia: Secondary | ICD-10-CM | POA: Diagnosis not present

## 2020-10-29 DIAGNOSIS — I2510 Atherosclerotic heart disease of native coronary artery without angina pectoris: Secondary | ICD-10-CM | POA: Diagnosis not present

## 2020-10-29 MED ORDER — METOPROLOL TARTRATE 50 MG PO TABS: 50.0000 mg | ORAL_TABLET | Freq: Two times a day (BID) | ORAL | 3 refills | Status: DC

## 2020-10-29 NOTE — Patient Instructions (Signed)
Medication Instructions:  Your physician has recommended you make the following change in your medication:  1.  REDUCE the Lopressor to 50 mg taking 1 tablet twice a day  *If you need a refill on your cardiac medications before your next appointment, please call your pharmacy*   Lab Work: TODAY:  MAG & TSH  If you have labs (blood work) drawn today and your tests are completely normal, you will receive your results only by: Marland Kitchen MyChart Message (if you have MyChart) OR . A paper copy in the mail If you have any lab test that is abnormal or we need to change your treatment, we will call you to review the results.   Testing/Procedures: Your physician has requested that you have an echocardiogram. Echocardiography is a painless test that uses sound waves to create images of your heart. It provides your doctor with information about the size and shape of your heart and how well your heart's chambers and valves are working. This procedure takes approximately one hour. There are no restrictions for this procedure.     Follow-Up: At [facility], you and your health needs are our priority.  As part of our continuing mission to provide you with exceptional heart care, we have created designated Provider Care Teams.  These Care Teams include your primary Cardiologist (physician) and Advanced Practice Providers (APPs -  Physician Assistants and Nurse Practitioners) who all work together to provide you with the care you need, when you need it.  We recommend signing up for the patient portal called "MyChart".  Sign up information is provided on this After Visit Summary.  MyChart is used to connect with patients for Virtual Visits (Telemedicine).  Patients are able to view lab/test results, encounter notes, upcoming appointments, etc.  Non-urgent messages can be sent to your provider as well.   To learn more about what you can do with MyChart, go to [email or web address].    Your next appointment:    6 week(s)  The format for your next appointment:   In Person  Provider:   Melina Copa, PA-C   Other Instructions  Echocardiogram An echocardiogram is a test that uses sound waves (ultrasound) to produce images of the heart. Images from an echocardiogram can provide important information about:  Heart size and shape.  The size and thickness and movement of your heart's walls.  Heart muscle function and strength.  Heart valve function or if you have stenosis. Stenosis is when the heart valves are too narrow.  If blood is flowing backward through the heart valves (regurgitation).  A tumor or infectious growth around the heart valves.  Areas of heart muscle that are not working well because of poor blood flow or injury from a heart attack.  Aneurysm detection. An aneurysm is a weak or damaged part of an artery wall. The wall bulges out from the normal force of blood pumping through the body. Tell a health care provider about:  Any allergies you have.  All medicines you are taking, including vitamins, herbs, eye drops, creams, and over-the-counter medicines.  Any blood disorders you have.  Any surgeries you have had.  Any medical conditions you have.  Whether you are pregnant or may be pregnant. What are the risks? Generally, this is a safe test. However, problems may occur, including an allergic reaction to dye (contrast) that may be used during the test. What happens before the test? No specific preparation is needed. You may eat and drink normally. What happens during  the test?  You will take off your clothes from the waist up and put on a hospital gown.  Electrodes or electrocardiogram (ECG)patches may be placed on your chest. The electrodes or patches are then connected to a device that monitors your heart rate and rhythm.  You will lie down on a table for an ultrasound exam. A gel will be applied to your chest to help sound waves pass through your skin.  A  handheld device, called a transducer, will be pressed against your chest and moved over your heart. The transducer produces sound waves that travel to your heart and bounce back (or "echo" back) to the transducer. These sound waves will be captured in real-time and changed into images of your heart that can be viewed on a video monitor. The images will be recorded on a computer and reviewed by your health care provider.  You may be asked to change positions or hold your breath for a short time. This makes it easier to get different views or better views of your heart.  In some cases, you may receive contrast through an IV in one of your veins. This can improve the quality of the pictures from your heart. The procedure may vary among health care providers and hospitals.   What can I expect after the test? You may return to your normal, everyday life, including diet, activities, and medicines, unless your health care provider tells you not to do that. Follow these instructions at home:  It is up to you to get the results of your test. Ask your health care provider, or the department that is doing the test, when your results will be ready.  Keep all follow-up visits. This is important. Summary  An echocardiogram is a test that uses sound waves (ultrasound) to produce images of the heart.  Images from an echocardiogram can provide important information about the size and shape of your heart, heart muscle function, heart valve function, and other possible heart problems.  You do not need to do anything to prepare before this test. You may eat and drink normally.  After the echocardiogram is completed, you may return to your normal, everyday life, unless your health care provider tells you not to do that. This information is not intended to replace advice given to you by your health care provider. Make sure you discuss any questions you have with your health care provider. Document Revised:  03/02/2020 Document Reviewed: 03/02/2020 Elsevier Patient Education  2021 [employer].

## 2020-10-30 LAB — MAGNESIUM: Magnesium: 2 mg/dL (ref 1.6–2.3)

## 2020-10-30 LAB — TSH: TSH: 2.51 u[IU]/mL (ref 0.450–4.500)

## 2020-11-01 ENCOUNTER — Ambulatory Visit (INDEPENDENT_AMBULATORY_CARE_PROVIDER_SITE_OTHER): Payer: Medicare HMO

## 2020-11-02 DIAGNOSIS — N5234 Erectile dysfunction following simple prostatectomy: Secondary | ICD-10-CM | POA: Diagnosis not present

## 2020-11-02 DIAGNOSIS — E782 Mixed hyperlipidemia: Secondary | ICD-10-CM | POA: Diagnosis not present

## 2020-11-02 DIAGNOSIS — I6523 Occlusion and stenosis of bilateral carotid arteries: Secondary | ICD-10-CM | POA: Diagnosis not present

## 2020-11-02 DIAGNOSIS — Z85820 Personal history of malignant melanoma of skin: Secondary | ICD-10-CM | POA: Diagnosis not present

## 2020-11-02 DIAGNOSIS — I2510 Atherosclerotic heart disease of native coronary artery without angina pectoris: Secondary | ICD-10-CM | POA: Diagnosis not present

## 2020-11-02 DIAGNOSIS — K219 Gastro-esophageal reflux disease without esophagitis: Secondary | ICD-10-CM | POA: Diagnosis not present

## 2020-11-02 DIAGNOSIS — I10 Essential (primary) hypertension: Secondary | ICD-10-CM | POA: Diagnosis not present

## 2020-11-02 DIAGNOSIS — T148XXA Other injury of unspecified body region, initial encounter: Secondary | ICD-10-CM | POA: Diagnosis not present

## 2020-11-02 DIAGNOSIS — I25119 Atherosclerotic heart disease of native coronary artery with unspecified angina pectoris: Secondary | ICD-10-CM | POA: Diagnosis not present

## 2020-11-02 DIAGNOSIS — Z0000 Encounter for general adult medical examination without abnormal findings: Secondary | ICD-10-CM | POA: Diagnosis not present

## 2020-11-04 ENCOUNTER — Other Ambulatory Visit (HOSPITAL_BASED_OUTPATIENT_CLINIC_OR_DEPARTMENT_OTHER): Payer: Self-pay

## 2020-11-04 MED ORDER — COVID-19 MRNA VACCINE (PFIZER) 30 MCG/0.3ML IM SUSP
INTRAMUSCULAR | 0 refills | Status: DC
  Filled 2020-11-04: qty 0.3, 1d supply, fill #0

## 2020-11-05 DIAGNOSIS — I493 Ventricular premature depolarization: Secondary | ICD-10-CM | POA: Diagnosis not present

## 2020-11-10 IMAGING — CR CHEST - 2 VIEW
2 series · 2 of 2 positions shown · non-contrast
Comparison: None.

CLINICAL DATA: Coronary artery disease

EXAM:
CHEST - 2 VIEW

[w chest pa]
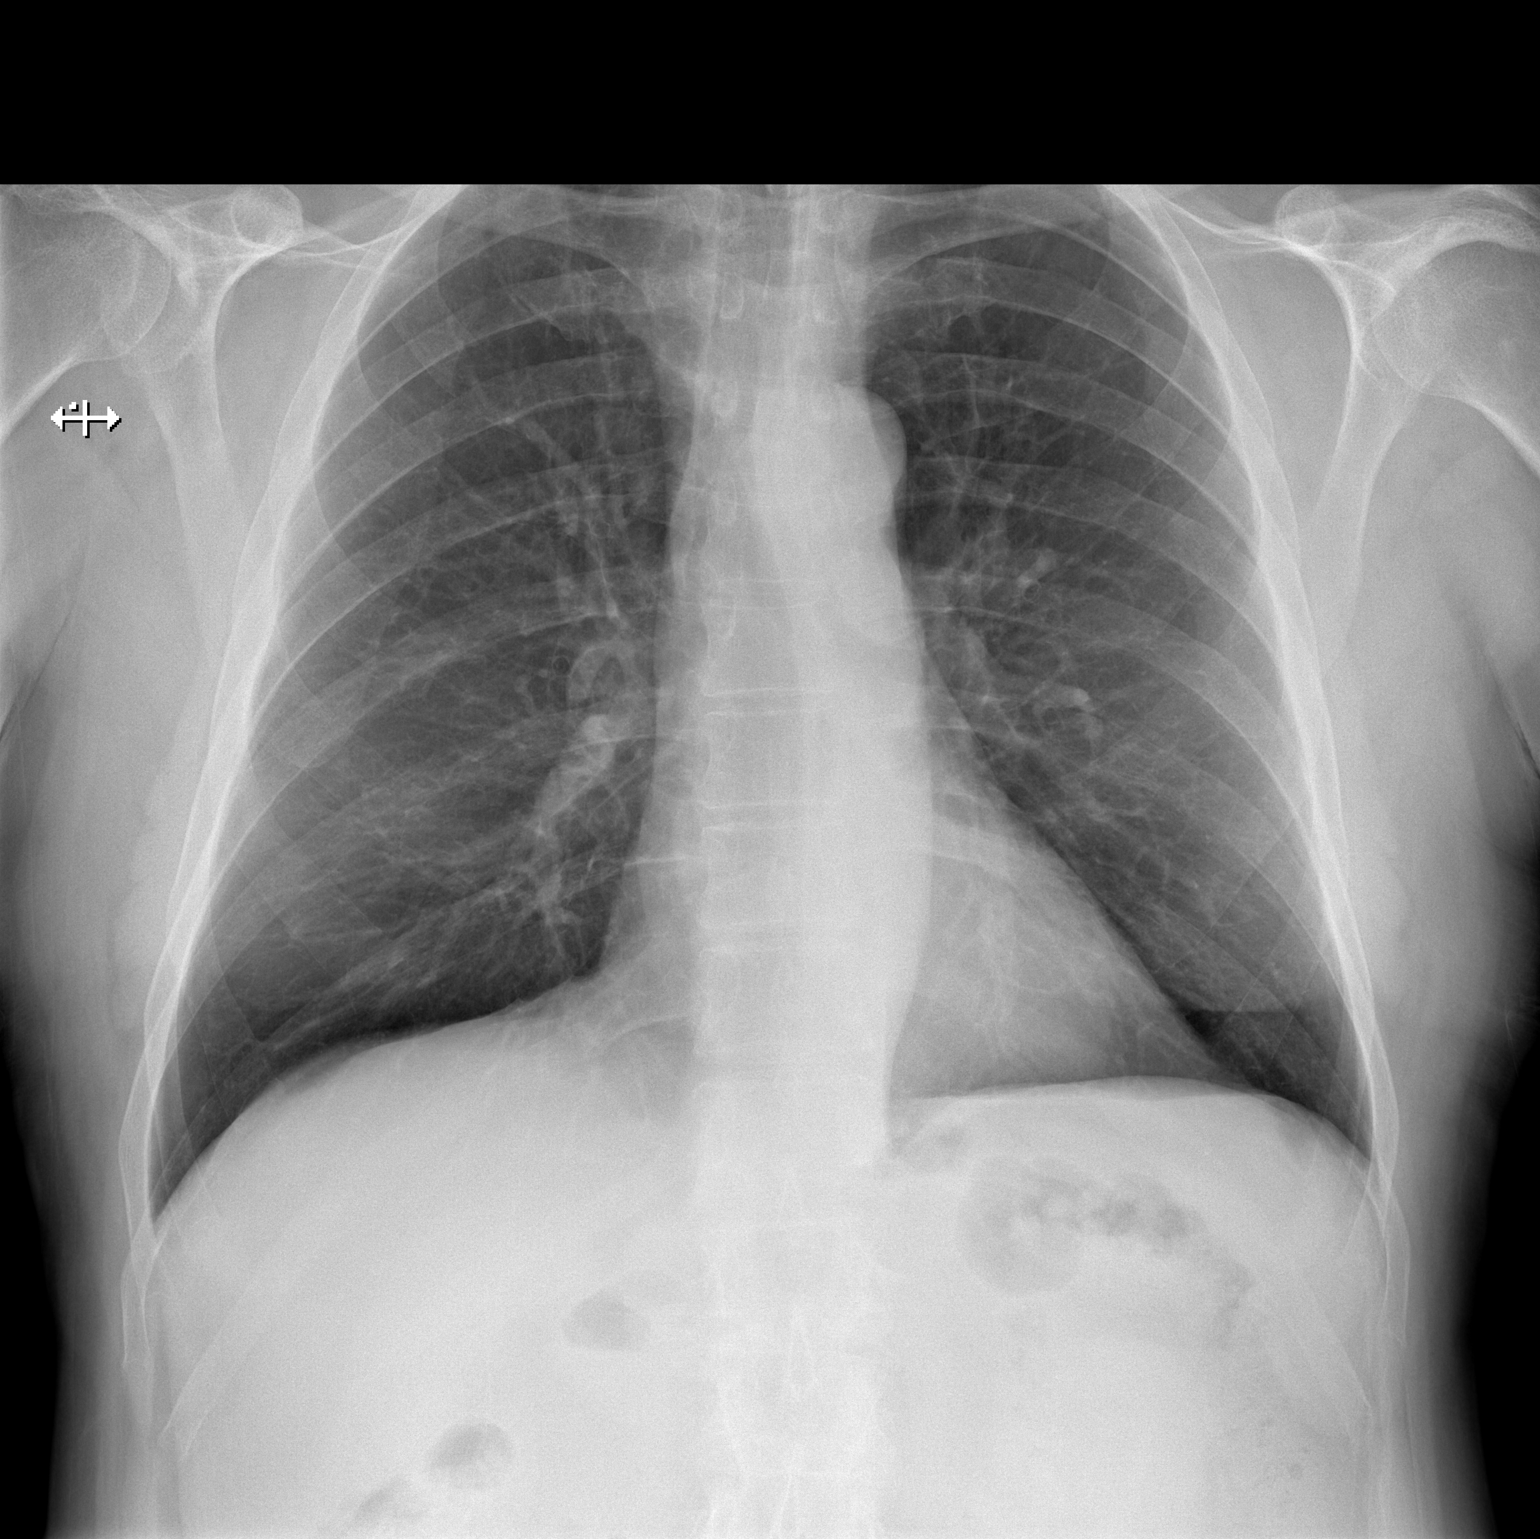

[w chest lat]
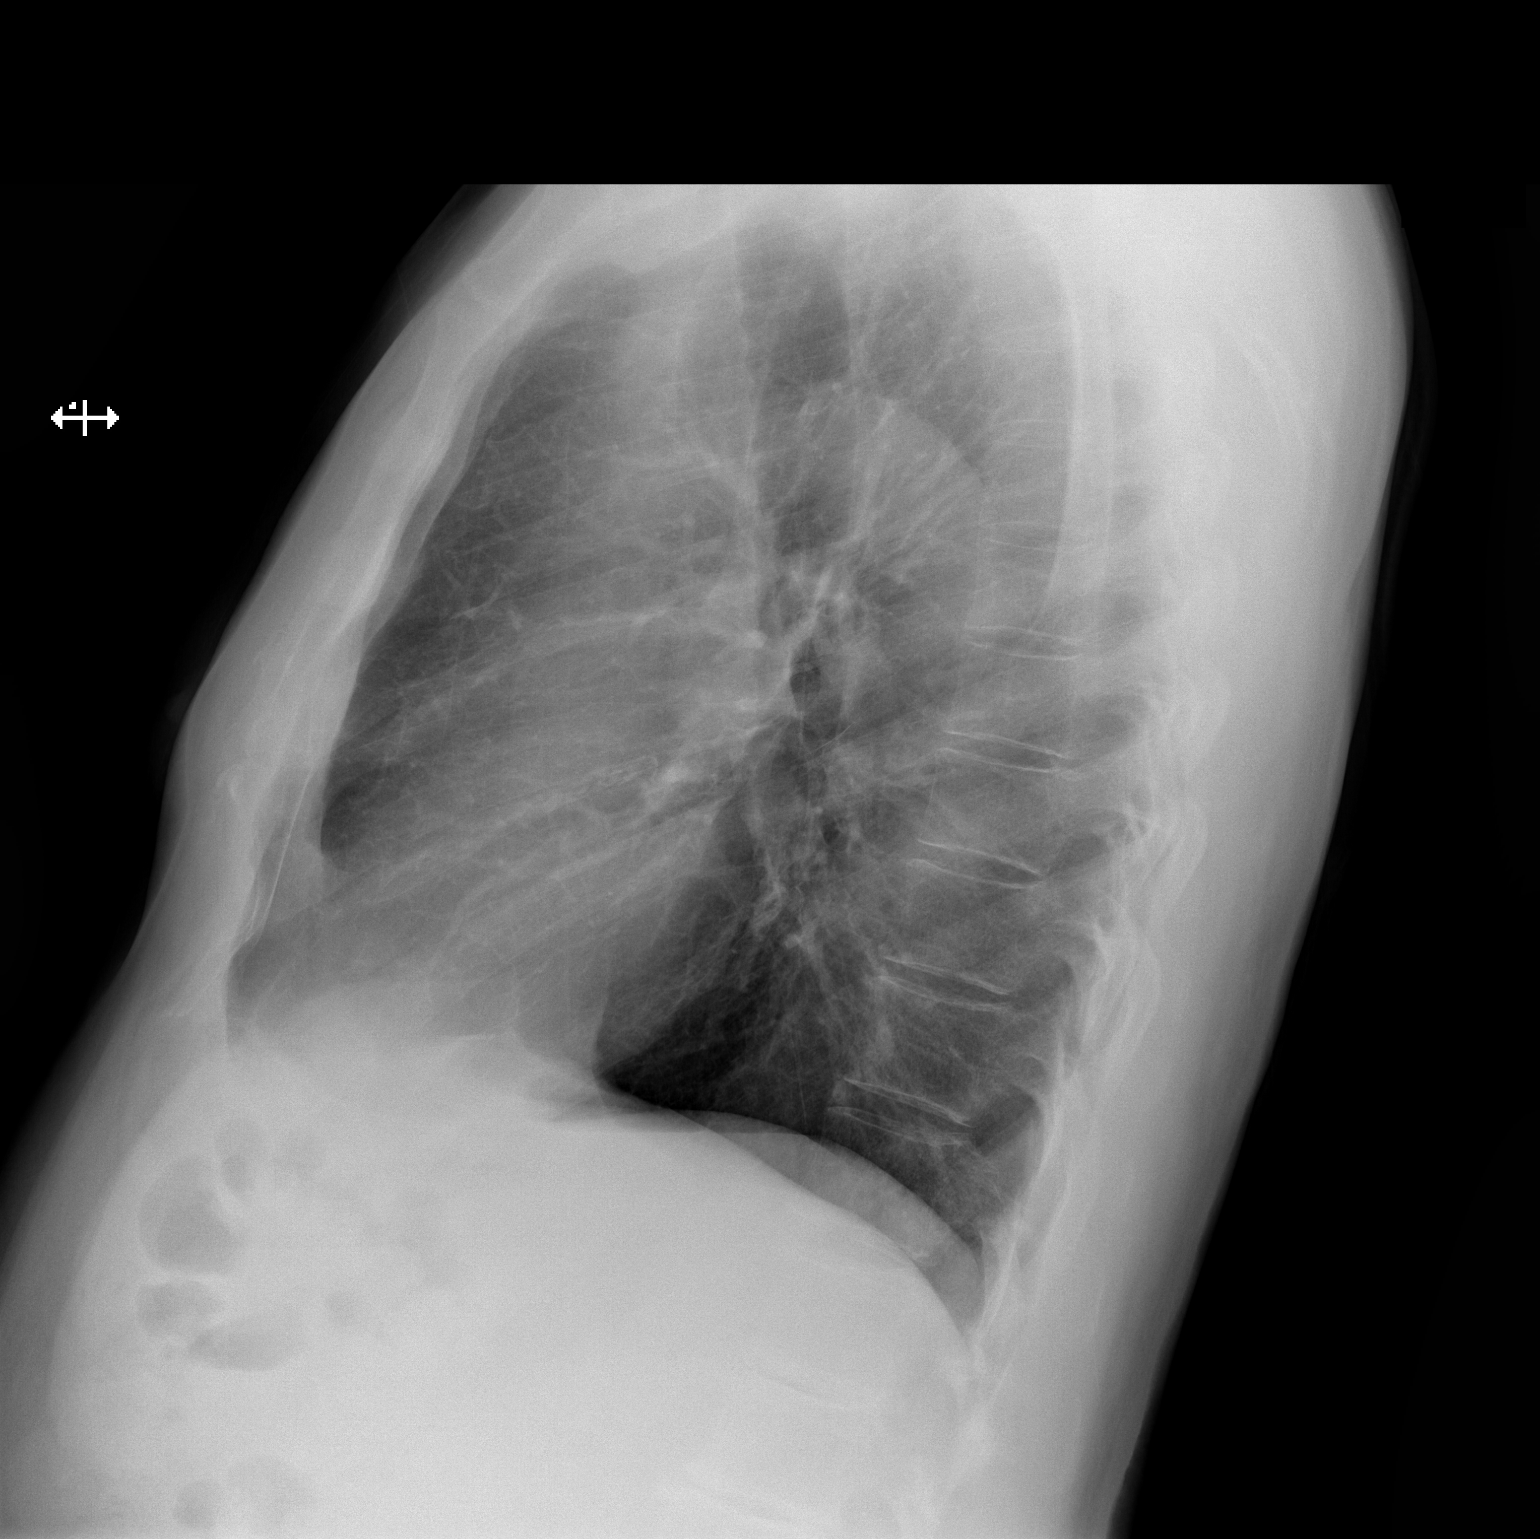

[2 of 2 positions shown; findings below may reference images not displayed]

FINDINGS: The heart size and mediastinal contours are within normal limits.
Both lungs are clear. The visualized skeletal structures are
unremarkable.
IMPRESSION: No active cardiopulmonary disease.

## 2020-12-03 ENCOUNTER — Other Ambulatory Visit: Payer: Self-pay

## 2020-12-03 ENCOUNTER — Ambulatory Visit (HOSPITAL_COMMUNITY): Payer: Medicare HMO | Attending: Cardiovascular Disease

## 2020-12-03 DIAGNOSIS — I77810 Thoracic aortic ectasia: Secondary | ICD-10-CM

## 2020-12-03 DIAGNOSIS — E785 Hyperlipidemia, unspecified: Secondary | ICD-10-CM

## 2020-12-03 DIAGNOSIS — I2510 Atherosclerotic heart disease of native coronary artery without angina pectoris: Secondary | ICD-10-CM

## 2020-12-03 DIAGNOSIS — R001 Bradycardia, unspecified: Secondary | ICD-10-CM | POA: Diagnosis not present

## 2020-12-03 DIAGNOSIS — I493 Ventricular premature depolarization: Secondary | ICD-10-CM

## 2020-12-03 DIAGNOSIS — I10 Essential (primary) hypertension: Secondary | ICD-10-CM

## 2020-12-03 LAB — ECHOCARDIOGRAM COMPLETE
Area-P 1/2: 4.06 cm2
S' Lateral: 3.1 cm

## 2020-12-05 ENCOUNTER — Encounter: Payer: Self-pay | Admitting: Physician Assistant

## 2020-12-05 NOTE — Progress Notes (Signed)
Cardiology Office Note    Date:  12/07/2020   ID:  Jared Roberts, DOB [birth year], MRN [id number]  PCP:  Jared Pretty, MD  Cardiologist:  Jared Moores, MD  Electrophysiologist:  None   Chief Complaint: f/u PVCs  History of Present Illness:   Jared Roberts is a 75 y.o. male with history of CAD (s/p CABG 02/2019 with LIMA-LAD, SVG-OM, SVG-PDA, NSTEMI/VF arrest 03/2019 felt due to early graft failure s/p PTCA to distal Cx/OM2, then repeat PCI later 03/2019 with DES to Jared Roberts and PTCA to OM branch), right cranial nerve VI palsy, HTN, HLD (myalgias on statins), TIA, PVCs, sinus bradycardia, prostate CA, mild dilation of aortic root/ascending aorta, mild 1-39% BICA stenosis 07/2020, probable mild CKD IIIa by labs who presents for follow-up.  He established care with Jared Roberts in 01/2019 for symptoms concerning for angina. Coronary CTA was abnormal prompting cath demonstrating MVCAD. He underwent CABG as above in 02/2019. He presented back to Jared Roberts 09/14/2020via EMS w/ VF arrest.He was administered a single shock at 200J and NSR was restored. Cath showed occlusion of Jared saphenous vein graft to Jared distal circumflex and probable embolization into Jared obtuse marginal beyond Jared graft insertion site.He had successful mechanical reperfusion with PTCA only of Jared distal Cx/OM2 secondary to smallvessel size.Echo showed preserved LVEF.VF was felt related to acute ischemia, felt no indication for ischemia. He then returned to Jared hospital later that month with recurrent chest pain resulting in DES to Jared Roberts and PTCA of OM branch. EF normal at that time. He was seen by Jared Roberts in February with palpitations. His pulse ox was picking up a HR in Jared 30s. Per MyChart messages, his Brilinta and aspirin were stopped and he was changed to Plavix due to cost. He had sent in [place] tracings showing PVCs. No further changes advised at that time; Jared Roberts felt reassuring.   I met him in follow-up 10/2020 at which time  we reviewed some of Jared tracings from back in February - he was also having sinus bradycardia down to Jared low 40s while awake (I.e. 42bpm). He stated he would get Jared Jared Roberts when he would notice his HR registering low by pulse ox, not necessarily prompted by symptoms - was otherwise doing well and swimming regularly. Given Jared episodic bradycardia, we reduced his metoprolol to 36m BID. Cardiac monitor was planned. We also obtained an echo to f/u his aorta which showed EF 50-55%, hypokinesis of Jared basal to mid anteroseptal and inferoseptal myocardium, mild LVH of Jared basal-septal segment, grade 1 DD, moderately reduced RV function, normal PASP, mild dilation of aortic root/ascending aorta.  Cardiac monitor is pending formal report but preliminary report: *Jared observed rhythms are sinus bradycardia to sinus tachycardia. *Jared Maximum Heart Rate recorded was 178 bpm, Day 8 / 07:09:19 am, Jared Minimum Heart Rate recorded was 40 bpm, Day 4 / 11:54:20 pm and Jared Average Heart Rate was 64 bpm. *There were 5408 PVCs with a burden of 0.46 %. There were 2 occurrences of Ventricular Tachycardia with Jared longest episode 4 beats, Day 8 / 05:27:58 am and Jared fastest episode 174 bpm, Day 12 / 02:51:44 pm. *There were 2836 PSVCs with a burden of 0.24 %. There were 2 occurrences of Supraventricular Tachycardia with Jared longest episode 5 beats, Day 8 / 07:09:23 am and Jared fastest episode 178 bpm, Day 8 / 07:09:23 am. *There was 1 Patient reported event (unassociated with arrhythmia).  He returns for follow-up overall stable from prior  visit without any new/abrupt clinical changes. No angina or dyspnea with exertion. Still feels occasional skip. Back in April he did notice rare episodic HR in Jared 40s in Jared AM, but no symptoms associated with this. He has not been swimming at much because of some schedule changes. [place]. Now that he has been paying attention he just reports a generalized sense of  decreased stamina over Jared past few years. Whenever he does do exercise, he feels OK with Jared activity, but notices his energy feels zapped Jared following few hours. He and his wife report that he does snore. No pre-syncope or syncope.   Labwork independently reviewed: 10/2020 Mg 2.0, TSH wnl 10/2020 K 3.9, Cr 1.15, Cr 9.4, normal LFTs, Hgb 16.4, LDL 58 (pt showed [place] portal) 08/2020 Mg 2.2, K 4.0, calcium 10.3, Cr 1.22 11/2019 LDL 64, trig 188 11/2019 Hgb 15.6    Past Medical History:  Diagnosis Date  . Carotid artery disease ([place])   . CKD (chronic kidney disease), stage III ([place])   . Coronary artery disease    s/p CABG 02/2009 with  LIMA-LAD, SVG-OM, SVG-PDA, NSTEMI/VF arrest 03/2019 felt due to early graft failure s/p PTCA to distal Cx/OM2, then repeat PCI later 03/2019 with DES to mLCx and PTCA to OM branch  . Cranial nerve VI palsy   . DDD (degenerative disc disease), cervical   . Depression   . Dilated aortic root ([place])   . ED (erectile dysfunction) of organic origin   . Generalized osteoarthritis   . GERD (gastroesophageal reflux disease)   . Hiatal hernia   . History of chronic bronchitis   . History of gastritis    2003  . History of Helicobacter pylori infection    2003  . History of melanoma excision    2015--  right ear  . History of prostate cancer urologist-  dr Jared Roberts   2007--  pT2a Nx Mx,  Gleason 3+3, S/P  PROSTATECTOMY  . History of squamous cell carcinoma excision    2011  left knee  . Hyperlipidemia   . Hyperplastic colon polyp 2009  . Hypertension   . Male hypogonadism   . Melanoma ([place])    left 4th toe  . Nephrolithiasis   . Prostate cancer ([place])   . Sinus bradycardia   . Ventricular fibrillation ([place]) 04/07/2019   VF, en route witnessed by EMS, defibrillated x 1    Past Surgical History:  Procedure Laterality Date  . AMPUTATION TOE Left 09/07/2015   Procedure: PARTIAL AMPUTATION TOE 4TH LEFT;  Surgeon: Jared Piccolo, MD;  Location: [facility];  Service: Podiatry;  Laterality: Left;  . APPENDECTOMY  1970's  . CORONARY ARTERY BYPASS GRAFT N/A 03/12/2019   Procedure: CORONARY ARTERY BYPASS GRAFTING (CABG) x 3 WITH ENDOSCOPIC HARVESTING OF RIGHT GREATER SAPHENOUS VEIN;  Surgeon: [name], MD;  Location: [facility];  Service: Open Heart Surgery;  Laterality: N/A;  . CORONARY BALLOON ANGIOPLASTY N/A 04/07/2019   Procedure: CORONARY BALLOON ANGIOPLASTY;  Surgeon: [name], MD;  Location: [facility];  Service: Cardiovascular;  Laterality: N/A;  . CORONARY STENT INTERVENTION N/A 04/21/2019   Procedure: CORONARY STENT INTERVENTION;  Surgeon: [name], MD;  Location: [facility];  Service: Cardiovascular;  Laterality: N/A;  . CYSTO/  CLOT EVACUATION POST PROSTATECTOMY  11-19-2005  . CYSTO/  REMOVAL URETHRAL STONE AND URETHRAL STAPLES  06-02-2010  . CYSTO/  URETEROSCOPIC STONE EXTRACTION  X2  1990's  . KIDNEY STONE SURGERY    . LEFT HEART CATH AND CORONARY ANGIOGRAPHY N/A 02/25/2019   Procedure: LEFT HEART CATH AND CORONARY ANGIOGRAPHY;  Surgeon: [name], MD;  Location: [facility];  Service: Cardiovascular;  Laterality: N/A;  . LEFT HEART CATH AND CORS/GRAFTS ANGIOGRAPHY N/A 04/07/2019   Procedure: LEFT HEART CATH AND CORS/GRAFTS ANGIOGRAPHY;  Surgeon: [name], MD;  Location: [facility];  Service: Cardiovascular;  Laterality: N/A;  . LEFT HEART CATH AND CORS/GRAFTS ANGIOGRAPHY N/A 04/21/2019   Procedure: LEFT HEART CATH AND CORS/GRAFTS ANGIOGRAPHY;  Surgeon: [name], MD;  Location: [facility];  Service: Cardiovascular;  Laterality: N/A;  . MELANOMA EXCISION  2015   right ear  . ROBOT ASSISTED LAPAROSCOPIC RADICAL PROSTATECTOMY  11-09-2005  . SKIN CANCER EXCISION    . TEE WITHOUT CARDIOVERSION N/A 03/12/2019   Procedure: TRANSESOPHAGEAL ECHOCARDIOGRAM (TEE);  Surgeon: [name], MD;  Location: [facility];  Service: Open Heart Surgery;   Laterality: N/A;  . TONSILLECTOMY  child    Current Medications: Current Meds  Medication Sig  . ALPHA LIPOIC ACID PO Take 1 tablet by mouth daily.  . Ascorbic Acid (VITAMIN C PO) Take 1 tablet by mouth daily.  Marland Kitchen atorvastatin (LIPITOR) 20 MG tablet TAKE ONE TABLET BY MOUTH DAILY  . calcium carbonate (TUMS - DOSED IN MG ELEMENTAL CALCIUM) 500 MG chewable tablet Chew 2 tablets by mouth as needed for indigestion or heartburn.   . Cholecalciferol (VITAMIN D) 2000 units CAPS Take 2,000 Units by mouth 2 (two) times a day.   . CHROMIUM PO Take by mouth daily.  . clopidogrel (PLAVIX) 75 MG tablet Take 1 tablet (75 mg total) by mouth daily.  Marland Kitchen COVID-19 mRNA vaccine, Pfizer, 30 MCG/0.3ML injection Inject into Jared muscle.  . famotidine (PEPCID) 40 MG tablet Take 40 mg by mouth daily. Per patient taking 1/2 tablet of Jared 40 mg tabs.  . Fish Oil-Cholecalciferol (FISH OIL + D3 PO) Take 1 capsule by mouth daily.  Nyoka Cowden Tea, Camellia sinensis, (GREEN TEA PO) Take 1 tablet by mouth daily.  . hydrochlorothiazide (HYDRODIURIL) 25 MG tablet TAKE ONE TABLET BY MOUTH DAILY  . ibuprofen (ADVIL) 400 MG tablet Take 400 mg by mouth as needed for moderate pain.  . isosorbide mononitrate (IMDUR) 30 MG 24 hr tablet Take 1 tablet (30 mg total) by mouth daily.  Marland Kitchen losartan (COZAAR) 100 MG tablet TAKE ONE TABLET BY MOUTH DAILY  . MAGNESIUM PO Take 300 mg by mouth daily.  . metoprolol tartrate (LOPRESSOR) 50 MG tablet Take 1 tablet (50 mg total) by mouth 2 (two) times daily.  . Multiple Vitamin (MULTIVITAMIN WITH MINERALS) TABS tablet Take 1 tablet by mouth daily.  . nitroGLYCERIN (NITROSTAT) 0.4 MG SL tablet DISSOLVE 1 TAB UNDER TONGUE FOR CHEST PAIN - IF PAIN REMAINS AFTER 5 MIN, CALL 911 AND REPEAT DOSE. MAX 3 TABS IN 15 MINUTES  . potassium chloride (KLOR-CON) 10 MEQ tablet TAKE ONE TABLET BY MOUTH DAILY  . Probiotic Product (PROBIOTIC DAILY PO) Take 1 capsule by mouth daily.      Allergies:   Patient has no known  allergies.   Social History   Socioeconomic History  . Marital status: Married    Spouse name: [name]  . Number of children: 1  . Years of education: 53  . Highest education level: Master's degree (e.g., MA, MS, MEng, MEd, MSW, MBA)  Occupational History  . Occupation: retired  Comment: from chemical sales  Tobacco Use  . Smoking status: Never Smoker  . Smokeless tobacco: Never Used  Vaping Use  . Vaping Use: Never used  Substance and Sexual Activity  . Alcohol use: Yes    Comment: 1 glass of wine daily  . Drug use: No  . Sexual activity: Not on file    Comment: retired, married. 1 son  Other Topics Concern  . Not on file  Social History Narrative  . Not on file   Social Determinants of Health   Financial Resource Strain: Not on file  Food Insecurity: Not on file  Transportation Needs: Not on file  Physical Activity: Not on file  Stress: Not on file  Social Connections: Not on file     Family History:  Jared patient's family history includes Arthritis in his mother; Congestive Heart Failure in his mother; Diabetes Mellitus II in his mother; Osteoarthritis in his mother; Other in his father.  ROS:   Please see Jared history of present illness.  All other systems are reviewed and otherwise negative.    EKGs/Labs/Other Studies Reviewed:    Studies reviewed are outlined and summarized above. Reports included below if pertinent.  Echo 11/2020   1. Hypokinesis of Jared basal to mid anteroseptal and inferoseptal  myocardium. Left ventricular ejection fraction, by estimation, is 50 to  55%. Jared left ventricle has low normal function. Jared left ventricle  demonstrates regional wall motion abnormalities  (see scoring diagram/findings for description). There is mild left  ventricular hypertrophy of Jared basal-septal segment. Left ventricular  diastolic parameters are consistent with Grade I diastolic dysfunction  (impaired relaxation). Jared average left  ventricular global  longitudinal strain is -20.7 %. Jared global longitudinal  strain is normal.  2. Right ventricular systolic function is moderately reduced. Jared right  ventricular size is normal. There is normal pulmonary artery systolic  pressure.  3. Jared mitral valve is normal in structure. No evidence of mitral valve  regurgitation. No evidence of mitral stenosis.  4. Jared aortic valve is tricuspid. Aortic valve regurgitation is trivial.  No aortic stenosis is present.  5. Aortic dilatation noted. There is mild dilatation of Jared aortic root,  measuring 38 mm. There is mild dilatation of Jared ascending aorta,  measuring 38 mm.  6. Jared inferior vena cava is normal in size with greater than 50%  respiratory variability, suggesting right atrial pressure of 3 mmHg.   Comparison(s): 10/16/19 EF 60-65%. GLS -17%.   Echo 09/2019  1. Left ventricular ejection fraction, by estimation, is 60 to 65%. Jared  left ventricle has normal function. Jared left ventricle has no regional  wall motion abnormalities. There is mild left ventricular hypertrophy of  Jared basal-septal segment. Left  ventricular diastolic parameters are consistent with Grade I diastolic  dysfunction (impaired relaxation). Jared average left ventricular global  longitudinal strain is normal at -17.0 %.  2. Right ventricular systolic function is normal. Jared right ventricular  size is mildly enlarged. Tricuspid regurgitation signal is inadequate for  assessing PA pressure.  3. Jared mitral valve is normal in structure. Mild mitral valve  regurgitation. No evidence of mitral stenosis.  4. Jared aortic valve is tricuspid. Aortic valve regurgitation is not  visualized. Moderate aortic valve sclerosis/calcification is present,  without any evidence of aortic stenosis.  5. Aortic dilatation noted. There is mild dilatation of Jared aortic root  measuring 39 mm.  6. Jared inferior vena cava is normal in size with greater than 50%  respiratory variability,  suggesting right atrial pressure of 3 mmHg.   Last cath 03/2019  Prox LAD to Mid LAD lesion is 60% stenosed.  Mid LAD lesion is 80% stenosed.  Dist Cx lesion is 95% stenosed.  Balloon angioplasty was performed.  Prox RCA lesion is 90% stenosed.  Origin to Prox Graft lesion is 100% stenosed.  SVG.  LIMA and is normal in caliber.  Graft was not visualized.  Prox Cx to Mid Cx lesion is 80% stenosed.  Jared left ventricular systolic function is normal.  LV end diastolic pressure is normal.  Jared left ventricular ejection fraction is 55-65% by visual estimate.  There is no mitral valve regurgitation.  1. Triple vessel CAD s/p 3V CABG with 2/3 patent bypass grafts 2. Severe stenosis mid LAD. Patent LIMA graft to LAD 3. Severe stenosis mid Circumflex. Severe stenosis in Jared very small caliber first obtuse marginal branch at Jared site of Jared vein graft anastomosis. Occluded vein graft to SVG (known, not injected today).  4. Successful DES x 1 mid Circumflex.  5. Successful balloon angioplasty small caliber obtuse marginal branch 6. Severe stenosis proximal RCA. Patent vein graft to PDA.  7. Normal LV systolic function  Recommendations: ASA and Brilinta for one year. Continue statin and beta blocker. Will add a long acting nitrate.     EKG:  EKG is not ordered today  Recent Labs: 12/10/2019: Hemoglobin 15.6; Platelets 313 09/08/2020: BUN 16; Creatinine, Ser 1.22; Potassium 4.0; Sodium 140 10/29/2020: Magnesium 2.0; TSH 2.510  Recent Lipid Panel    Component Value Date/Time   CHOL 157 12/15/2019 0941   TRIG 188 (H) 12/15/2019 0941   HDL 62 12/15/2019 0941   CHOLHDL 2.5 12/15/2019 0941   CHOLHDL 3.4 04/08/2019 0300   VLDL 37 04/08/2019 0300   LDLCALC 64 12/15/2019 0941    PHYSICAL EXAM:    VS:  BP 120/82   Pulse 70   Ht _0  (1.778 m)   Wt 176 lb 9.6 oz (80.1 kg)   SpO2 96%   BMI 25.34 kg/m   BMI: Body mass index is 25.34 kg/m.  GEN: Well nourished, well  developed male in no acute distress HEENT: normocephalic, atraumatic Neck: no JVD, carotid bruits, or masses Cardiac: RRR; no murmurs, rubs, or gallops, no edema  Respiratory:  clear to auscultation bilaterally, normal work of breathing GI: soft, nontender, nondistended, + BS MS: no deformity or atrophy Skin: warm and dry, no rash Neuro:  Alert and Oriented x 3, Strength and sensation are intact, follows commands Psych: euthymic mood, full affect   Wt Readings from Last 3 Encounters:  12/07/20 176 lb 9.6 oz (80.1 kg)  10/29/20 176 lb 12.8 oz (80.2 kg)  09/08/20 177 lb 12.8 oz (80.6 kg)     ASSESSMENT & PLAN:   1. Generalized fatigue - formal event monitor read is pending but preliminary report was reviewed above, generally reassuring. He has no clear convincing anginal symptoms. His 2D echocardiogram does show a decline in LV function with wall motion abnormalities. I will ask Jared Roberts to review his echo side by side with prior study to determine if this is a clear change from before. If so, would suggest Lexiscan nuclear stress test. I did discuss consent with him at this visit and he agrees to proceed if that is what Jared Roberts recommends. Some of his symptoms sound convincing for OSA. His wife was just diagnosed so they are familiar. Will set him up for sleep study.  2. Cardiomyopathy - LVEF 50-55% with WMA, also moderately reduced RV function. See above regarding echo discussion. He appears euvolemic on exam. Will otherwise continue current regimen including metoprolol, losartan, isosorbide.  3. PVCs, also sinus bradycardia - event monitor reviewed - fairly low PVC burden by recent monitor with other findings as outlined. We recently reduced his beta blocker due to presence of baseline bradycardia. He did previously notice HR in Jared 40s at times per his report but is asymptomatic with this. Event monitor correlated that this is mostly nocturnal in nature with average HR 64bpm, no  pauses or AV block. Given his history of ventricular arrhythmias, coronary disease, and PVCs would prefer to continue beta blocker (metoprolol) as tolerated so will hold dose at present state of 72m BID. He follows VS extremely closely with an app on his phone and will notify for any change in symptoms or trends.   5. CAD s/p prior CABG, PCI (see prior note) - see above. No recent angina. He is on Plavix monotherapy by Dr. NAcie Roberts Lipids at goal by recent review of labs. Continue BB, Imdur as above. BP well controlled.  6. Mild dilation of ascending aorta/aortic root - anticipate repeat echo in 1 year - can be arranged at discretion of primary cardiologist in follow-up.  Disposition: F/u with Dr. NAcie Fredricksonin 6 months, sooner if needed.   Medication Adjustments/Labs and Tests Ordered: Current medicines are reviewed at length with Jared patient today.  Concerns regarding medicines are outlined above. Medication changes, Labs and Tests ordered today are summarized above and listed in Jared Patient Instructions accessible in Encounters.   Signed, DCharlie Pitter PA-C  12/07/2020 3:22 PM    [facility] [address] [place]  [id number]: [phone] Fax: [phone]

## 2020-12-07 ENCOUNTER — Ambulatory Visit: Payer: Medicare HMO | Admitting: Physician Assistant

## 2020-12-07 ENCOUNTER — Other Ambulatory Visit: Payer: Self-pay

## 2020-12-07 ENCOUNTER — Encounter: Payer: Self-pay | Admitting: Physician Assistant

## 2020-12-07 VITALS — BP 120/82 | HR 70 | Ht 70.0 in | Wt 176.6 lb

## 2020-12-07 DIAGNOSIS — R5383 Other fatigue: Secondary | ICD-10-CM

## 2020-12-07 DIAGNOSIS — I77810 Thoracic aortic ectasia: Secondary | ICD-10-CM

## 2020-12-07 DIAGNOSIS — I495 Sick sinus syndrome: Secondary | ICD-10-CM

## 2020-12-07 DIAGNOSIS — I2510 Atherosclerotic heart disease of native coronary artery without angina pectoris: Secondary | ICD-10-CM

## 2020-12-07 DIAGNOSIS — I429 Cardiomyopathy, unspecified: Secondary | ICD-10-CM

## 2020-12-07 DIAGNOSIS — I493 Ventricular premature depolarization: Secondary | ICD-10-CM

## 2020-12-07 NOTE — Progress Notes (Signed)
Patient Name: STOP BANG RISK ASSESSMENT S (snore) Have you been told that you snore?     YES   T (tired) Are you often tired, fatigued, or sleepy during the day?   YES  O (obstruction) Do you stop breathing, choke, or gasp during sleep? YES   P (pressure) Do you have or are you being treated for high blood pressure? YES   B (BMI) Is your body index greater than 35 kg/m? NO   A (age) Are you 75 years old or older? YES   N (neck) Do you have a neck circumference greater than 16 inches?   NO   G (gender) Are you a male? YES   TOTAL STOP/BANG "YES" ANSWERS 6                                                                       For Office Use Only              Procedure Order Form

## 2020-12-07 NOTE — Patient Instructions (Signed)
Medication Instructions:  Your physician recommends that you continue on your current medications as directed. Please refer to the Current Medication list given to you today.  *If you need a refill on your cardiac medications before your next appointment, please call your pharmacy*   Lab Work: -NONE   If you have labs (blood work) drawn today and your tests are completely normal, you will receive your results only by: Marland Kitchen MyChart Message (if you have MyChart) OR . A paper copy in the mail If you have any lab test that is abnormal or we need to change your treatment, we will call you to review the results.   Testing/Procedures: Your physician has recommended that you have a sleep study. This test records several body functions during sleep, including: brain activity, eye movement, oxygen and carbon dioxide blood levels, heart rate and rhythm, breathing rate and rhythm, the flow of air through your mouth and nose, snoring, body muscle movements, and chest and belly movement.  Follow-Up: At [facility], you and your health needs are our priority.  As part of our continuing mission to provide you with exceptional heart care, we have created designated Provider Care Teams.  These Care Teams include your primary Cardiologist (physician) and Advanced Practice Providers (APPs -  Physician Assistants and Nurse Practitioners) who all work together to provide you with the care you need, when you need it.  We recommend signing up for the patient portal called "MyChart".  Sign up information is provided on this After Visit Summary.  MyChart is used to connect with patients for Virtual Visits (Telemedicine).  Patients are able to view lab/test results, encounter notes, upcoming appointments, etc.  Non-urgent messages can be sent to your provider as well.   To learn more about what you can do with MyChart, go to [email or web address].    Your next appointment:   6 month(s)  The format for your next  appointment:   In Person  Provider:   Mertie Moores, MD   Other Instructions Your physician wants you to follow-up in: 6 month with Dr. Vilinda Boehringer will receive a reminder letter in the mail two months in advance. If you don't receive a letter, please call our office to schedule the follow-up appointment.

## 2020-12-08 ENCOUNTER — Telehealth: Payer: Self-pay | Admitting: Physician Assistant

## 2020-12-08 NOTE — Telephone Encounter (Signed)
At the end of yesterday's OV, patient had additional questions but I was occupied with next patient already. I called to discuss his question - he has an article he would like me to review about average sodium levels in HF patients. We discussed relationship of sodium to HF. He has been drinking a lot of V8 and liquid IV as previously instructed by Dr. Acie Fredrickson for possible dehydration given elevated HR after swimming. These have not made much of a difference so he will return to replacing this more with pure hydration/i.e. water. He will send the article along for review and was appreciative of call.  Salvador Bigbee PA-C

## 2020-12-10 ENCOUNTER — Telehealth: Payer: Self-pay | Admitting: Physician Assistant

## 2020-12-10 DIAGNOSIS — I77810 Thoracic aortic ectasia: Secondary | ICD-10-CM

## 2020-12-10 DIAGNOSIS — I429 Cardiomyopathy, unspecified: Secondary | ICD-10-CM

## 2020-12-10 NOTE — Telephone Encounter (Signed)
Please call Jared Roberts and let him know Dr. Acie Fredrickson has been out of the office but Dr. Harrington Challenger reviewed his echo side by side compared to prior. She states the image quality was not great and did not fully get to see what needed to be seen in the study. She would recommend a follow-up LIMITED echo with Definity contrast to further evaluate the right and left heart pumping function. Please arrange, thx.

## 2020-12-10 NOTE — Telephone Encounter (Signed)
Spoke with the patient and have scheduled him for a limited echocardiogram.

## 2020-12-13 ENCOUNTER — Telehealth: Payer: Self-pay | Admitting: *Deleted

## 2020-12-13 ENCOUNTER — Encounter (INDEPENDENT_AMBULATORY_CARE_PROVIDER_SITE_OTHER): Payer: Medicare HMO | Admitting: Physician Assistant

## 2020-12-13 ENCOUNTER — Telehealth: Payer: Self-pay

## 2020-12-13 DIAGNOSIS — I10 Essential (primary) hypertension: Secondary | ICD-10-CM

## 2020-12-13 DIAGNOSIS — G4733 Obstructive sleep apnea (adult) (pediatric): Secondary | ICD-10-CM | POA: Diagnosis not present

## 2020-12-13 IMAGING — DX DG CHEST 1V PORT
1 series · 1 of 1 positions shown · non-contrast
Comparison: Radiographs March 15, 2019.

CLINICAL DATA: Chest pain.

EXAM:
PORTABLE CHEST 1 VIEW

[chest]
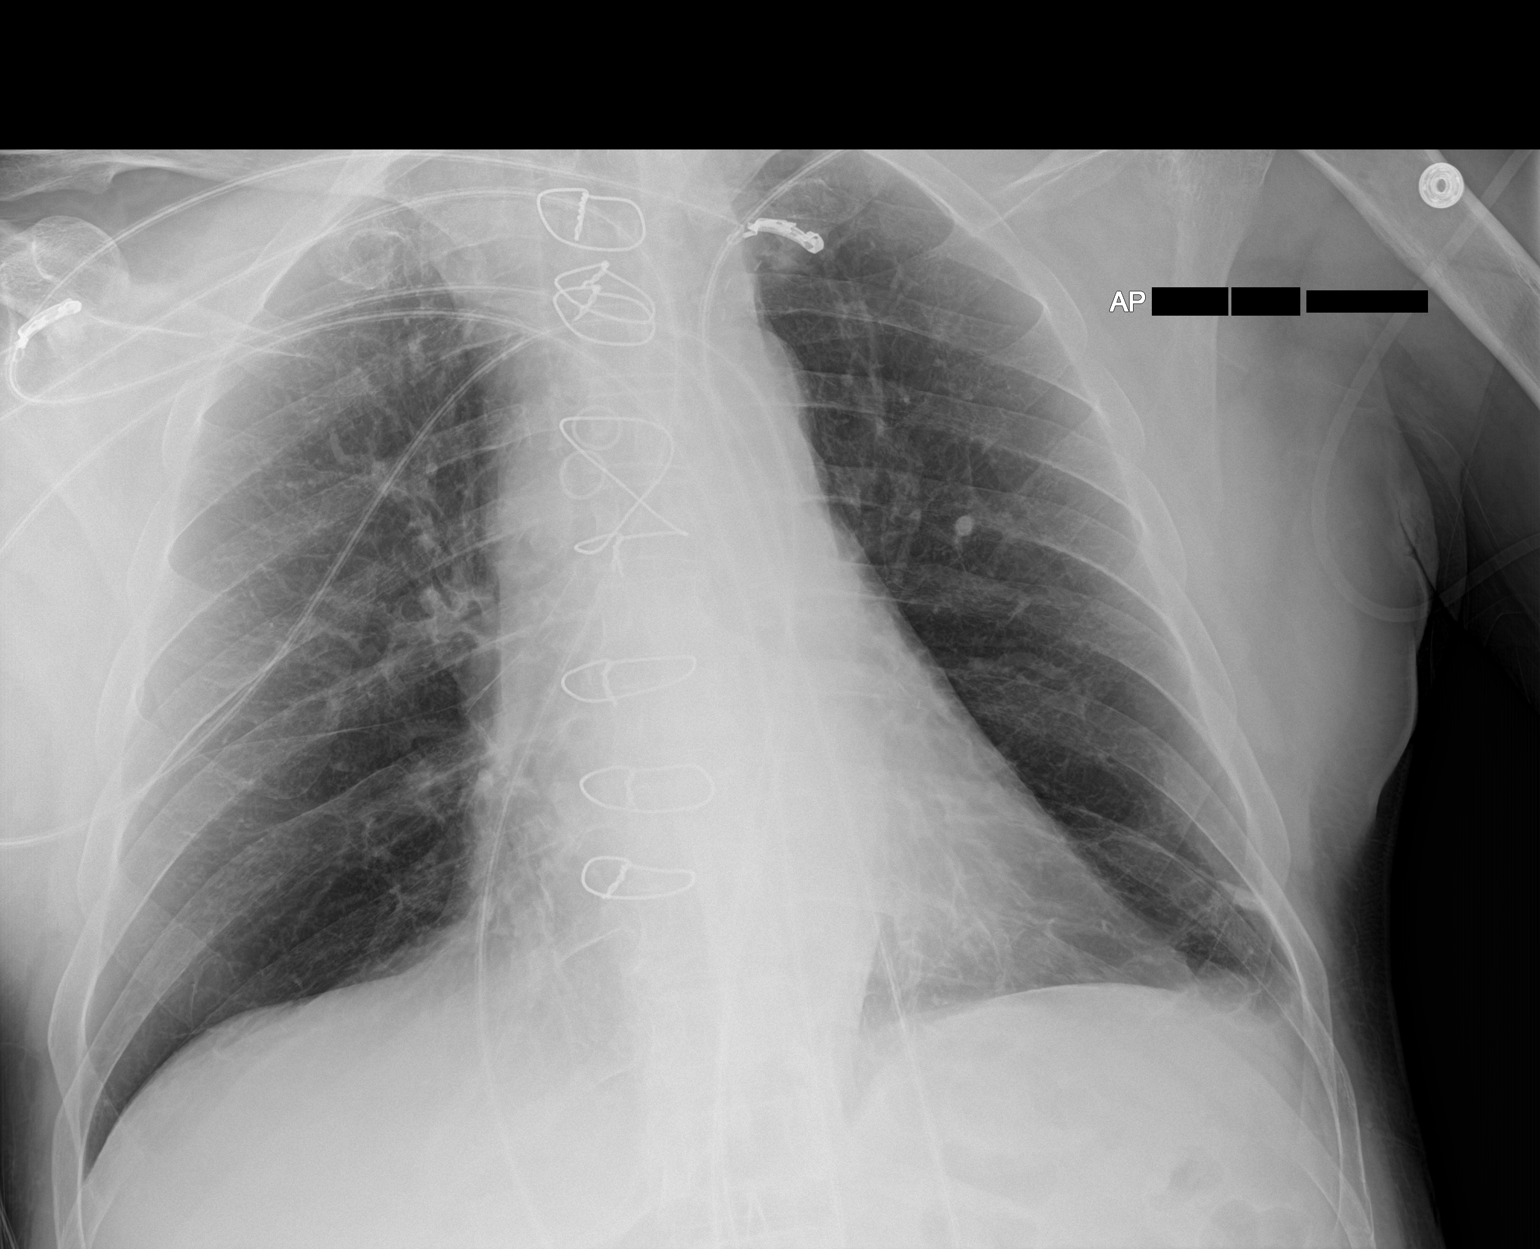

[1 of 1 positions shown; findings below may reference images not displayed]

FINDINGS: Stable cardiomediastinal silhouette. Status post coronary bypass
graft. No pneumothorax is noted. Right lung is clear. Minimal left
basilar subsegmental atelectasis is noted with small pleural
effusion. Bony thorax is unremarkable.
IMPRESSION: Minimal left basilar subsegmental atelectasis is noted with small
left pleural effusion.

## 2020-12-13 NOTE — Telephone Encounter (Signed)
Per [employer] precertification is not required for [id number].  Ref# [id number]. Forwarding message to [employer].

## 2020-12-13 NOTE — Telephone Encounter (Signed)
Left message to call the office in regards to [facility] Sleep Study, need to give pt instructions and PIN #.

## 2020-12-13 NOTE — Telephone Encounter (Signed)
-----   Message from Chapman Moss, RN sent at 12/13/2020  2:32 PM EDT ----- Regarding: Precertificaion for Francella Solian Per [employer] precertification is not required for [id number].  Ref# [id number]. ----- Message ----- From: Tamsen Snider Sent: 12/07/2020   3:27 PM EDT To: [facility] Pre Cert/Auth, #  Itmar  fatigue  Stevens Rettig  [phone]  Thanks danielle

## 2020-12-14 NOTE — Telephone Encounter (Signed)
S/w pt today to make sure he did receive the message I sent yesterday through [facility] in regards to sleep study Itmar. Pt said he did and he did the sleep study last night. I assured the pt that once the sleep study has been read we will let him know the results. Pt thanked me for the call and the help.

## 2020-12-23 ENCOUNTER — Telehealth: Payer: Self-pay | Admitting: Physician Assistant

## 2020-12-23 ENCOUNTER — Ambulatory Visit: Payer: Medicare HMO

## 2020-12-23 DIAGNOSIS — R5383 Other fatigue: Secondary | ICD-10-CM

## 2020-12-23 NOTE — Telephone Encounter (Signed)
Spoke with pt and advised of normal sleep study with recommendations as below per Melina Copa, PA.  Pt verbalizes understanding and states he will follow up with his PCP and plans to keep echo appointment scheduled for 06/202/2022.  Pt thanked Therapist, sports for the call.

## 2020-12-23 NOTE — Procedures (Signed)
   Sleep Study Report  Patient Information Name: Jared Roberts  ID: [id number] Birth Date: 10-15-1945  Age: 75  Gender:Male Insurer: BMI: 25.2 (W=176 lb, H=5' 10'') Study Date: 12/14/2020 Referring Physician: Melina Copa, NPN  TEST DESCRIPTION: Home sleep apnea testing was completed using the WatchPat, a Type 1 device, utilizing  peripheral arterial tonometry (PAT), chest movement, actigraphy, pulse oximetry, pulse rate, body position and snore.  AHI was calculated with apnea and hypopnea using valid sleep time as the denominator. RDI includes apneas,  hypopneas, and RERAs. The data acquired and the scoring of sleep and all associated events were performed in  accordance with the recommended standards and specifications as outlined in the AASM Manual for the Scoring of  Sleep and Associated Events 2.2.0 (2015).   FINDINGS: 1. No evidence of Obstructive Sleep Apnea with AHI 3.4/hr.  2. No Central Sleep Apnea. 3. Oxygen desaturations as low as 89%. 4. Mild snoring was present. O2 sats were < 88% for 33minutes. 5. Total sleep time was 6 hrs and 56 min. 6. 25.9% of total sleep time was spent in REM sleep.  7. Normal sleep onset latency at 20 min.  8. Shortened REM sleep onset latency at 57 min.  9. Total awakenings were 3.   DIAGNOSIS:  Normal study with no significant sleep disordered breathing.  RECOMMENDATIONS: 1. Normal study with no significant sleep disordered breathing.  2. Healthy sleep recommendations include: adequate nightly sleep (normal 7-9 hrs/night), avoidance of caffeine after  noon and alcohol near bedtime, and maintaining a sleep environment that is cool, dark and quiet.  3. Weight loss for overweight patients is recommended.   4. Snoring recommendations include: weight loss where appropriate, side sleeping, and avoidance of alcohol before  Bed.  5. Operation of motor vehicle or dangerous equipment must be avoided when feeling drowsy, excessively sleepy, or   mentally fatigued.   6. An ENT consultation which may be useful for specific causes of and possible treatment of bothersome snoring .   7. Weight loss may be of benefit in reducing the severity of snoring.   Report prepared by: Signature: Fransico Him, MD; [facility]; [place], [place] Board of Sleep Medicine  Electronically Signed: Dec 23, 2020

## 2020-12-23 NOTE — Telephone Encounter (Signed)
Sleep study has resulted. Please let pt know the following results/recommendations:  "RECOMMENDATIONS: 1. Normal study with no significant sleep disordered breathing.  2. Healthy sleep recommendations include: adequate nightly sleep (normal 7-9 hrs/night), avoidance of caffeine after  noon and alcohol near bedtime, and maintaining a sleep environment that is cool, dark and quiet.  3. Weight loss for overweight patients is recommended. (FYI Patient's BMI is near normal)  4. Snoring recommendations include: weight loss where appropriate, side sleeping, and avoidance of alcohol before bed.  5. Operation of motor vehicle or dangerous equipment must be avoided when feeling drowsy, excessively sleepy, or  mentally fatigued.   6. An ENT consultation which may be useful for specific causes of and possible treatment of bothersome snoring ."  In addition to plan for echo like we talked about, would suggest f/u with primary care for other causes of general fatigue.

## 2021-01-04 ENCOUNTER — Telehealth: Payer: Self-pay | Admitting: *Deleted

## 2021-01-04 NOTE — Telephone Encounter (Signed)
-----   Message from Sueanne Margarita, MD sent at 12/23/2020 11:43 AM EDT ----- Please let patient know that sleep study showed no significant sleep apnea.  Please set up in lab PSG given normal HST

## 2021-01-04 NOTE — Telephone Encounter (Signed)
The patient has been notified of the result and verbalized understanding.  All questions (if any) were answered. Jared Roberts, [place] 01/04/2021 4:19 PM   Per DPR left message on vm.

## 2021-01-10 ENCOUNTER — Ambulatory Visit (HOSPITAL_COMMUNITY): Payer: Medicare HMO | Attending: Cardiology

## 2021-01-10 ENCOUNTER — Other Ambulatory Visit: Payer: Self-pay

## 2021-01-10 DIAGNOSIS — E785 Hyperlipidemia, unspecified: Secondary | ICD-10-CM | POA: Diagnosis not present

## 2021-01-10 DIAGNOSIS — I2510 Atherosclerotic heart disease of native coronary artery without angina pectoris: Secondary | ICD-10-CM | POA: Insufficient documentation

## 2021-01-10 DIAGNOSIS — Z8249 Family history of ischemic heart disease and other diseases of the circulatory system: Secondary | ICD-10-CM | POA: Diagnosis not present

## 2021-01-10 DIAGNOSIS — N189 Chronic kidney disease, unspecified: Secondary | ICD-10-CM | POA: Insufficient documentation

## 2021-01-10 DIAGNOSIS — Z951 Presence of aortocoronary bypass graft: Secondary | ICD-10-CM | POA: Diagnosis not present

## 2021-01-10 DIAGNOSIS — I77810 Thoracic aortic ectasia: Secondary | ICD-10-CM | POA: Diagnosis not present

## 2021-01-10 DIAGNOSIS — I428 Other cardiomyopathies: Secondary | ICD-10-CM | POA: Diagnosis not present

## 2021-01-10 DIAGNOSIS — I429 Cardiomyopathy, unspecified: Secondary | ICD-10-CM | POA: Diagnosis not present

## 2021-01-10 LAB — ECHOCARDIOGRAM LIMITED
Area-P 1/2: 1.74 cm2
P 1/2 time: 661 msec
S' Lateral: 3.3 cm

## 2021-01-12 ENCOUNTER — Telehealth: Payer: Self-pay | Admitting: Physician Assistant

## 2021-01-12 NOTE — Telephone Encounter (Signed)
   See echo result note - Spoke with Dr. Acie Fredrickson regarding wall motion changes by echocardiogram along with new RV dysfunction as well. He feels since we cannot explain his wall motion abnormalities otherwise, would recommend OV to discuss heart catheterization. (Greater than 30 days from last OV.) Tentatively the plan will be to work him in next Fri in 10am overbook slot but need to confirm with patient first that he can make it. This patient needs reminder to arrive 15-20 minutes early to get checked in for focused visit. I called patient to let him know but he did not answer. LMOM to call back. This message can be relayed to him by nursing staff when he calls back. Please CC Para March when he calls back so she is aware of his ability to attend this appt so she can formally schedule it.

## 2021-01-13 NOTE — Telephone Encounter (Signed)
Addendum: I forwarded echo result note to Para March to try and reach out to patient again today to discuss adding on appt on Fri 7/1 for OV with Dr. Acie Fredrickson to discuss R/L heart cath. Dr. Acie Fredrickson had authorized putting the patient in for a 10am appt. Donnetta Simpers is going out on maternity leave after today and will subsequently forward to triage if she is unsuccessful at reaching the patient. Appreciate the assistance! See echo result note details on the study.

## 2021-01-21 ENCOUNTER — Encounter: Payer: Self-pay | Admitting: Cardiovascular Disease

## 2021-01-21 ENCOUNTER — Other Ambulatory Visit: Payer: Self-pay

## 2021-01-21 ENCOUNTER — Ambulatory Visit: Payer: Medicare HMO | Admitting: Cardiovascular Disease

## 2021-01-21 VITALS — BP 120/70 | HR 71 | Ht 70.0 in | Wt 176.4 lb

## 2021-01-21 DIAGNOSIS — I2510 Atherosclerotic heart disease of native coronary artery without angina pectoris: Secondary | ICD-10-CM | POA: Diagnosis not present

## 2021-01-21 DIAGNOSIS — I10 Essential (primary) hypertension: Secondary | ICD-10-CM

## 2021-01-21 NOTE — Patient Instructions (Signed)
Medication Instructions:  Your physician recommends that you continue on your current medications as directed. Please refer to the Current Medication list given to you today.  Labwork: None ordered.  Testing/Procedures: None ordered.  Follow-Up: Your physician recommends that you schedule a follow-up appointment in:   July 06, 2021 @ 11:20am with Dr. Acie Fredrickson   Any Other Special Instructions Will Be Listed Below (If Applicable).     If you need a refill on your cardiac medications before your next appointment, please call your pharmacy.

## 2021-01-21 NOTE — Progress Notes (Signed)
Cardiology Office Note:    Date:  01/21/2021   ID:  Jared Roberts, DOB Mar 16, 1946, MRN [id number]  PCP:  Deland Pretty, MD  Cardiologist:  Silvie Obremski  Electrophysiologist:  None   Referring MD: Deland Pretty, MD   Chief Complaint  Patient presents with   Coronary Artery Disease   Hypertension    January 31, 2019    Jamone Garrido Naraine is a 75 y.o. male with a hx of  HTN, TIA and carotid stenosis .  Has been having some chest pain  A week ago, mowed the lawn,  Had CP and profound fatigue. Upper left chest ,  Left shoulder and arm,  Left jaw.   Comes and goes ,  Last for seconds - minutes ,   Resolves if he stops to rest.  Associated with dyspnea but also has dyspnea on its own.  Doesn't exercise,  Works in the yard . Can climb 2 flights of stairs - makes him breath hard.   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  Non smoker, Drinks 1 drink per night   March 26, 2019:  Rush Landmark is seen back today for a follow-up office visit.  He recently had a heart catheterization which revealed severe coronary artery disease and he had coronary artery bypass grafting. BP has been variable Lower in the am Higher in the PM Cannot tell when his BP is high or low Chest is sore but no angina  Exercising some - walking around the house .   Has a referral for cardiac rehab  May 08, 2019: Rush Landmark is seen back today for follow-up of his coronary artery disease.  He status post coronary artery bypass grafting.  He had early graft failure and had a PCI of his left circumflex artery. He presented several weeks later with angina  and was found restenosis of his LCx.   He had stenting of his LCX and is now seen for follow up    Still is not feeling well Is really tired.  He is 8 weeks out from his CABG  1 month out from graft closure and PCI 2 weeks out from 2nd PCI .   No angina .   Has chest wall pain   Brought Pressure log with him.  His evening blood pressures were running a  little high.  We recently increased his Benicar up to 20 mg twice a day.  Has back pain since his cardiac arrest on Sept. 14.  Was given flexeril which helps but makes him sleepy .  Has developed double vision starting a week ago.   Has Right cranial nerve IV palsy .    Has continued back pain   May 09, 2019: Was seen yesterday late morning Left the office, went to [employer] very poorly  Took a nap.  Woke up,  HR was 120  Took a SL NTG .   Measured BP which was low  Took metoprolol 50 mg ,   Eventually felt better.  Review of labs from yesterday suggest volume depletion Hb is normal ECG is normal sinus rhythm  today  Will give him propranolol to take as needed.   June 26, 2019:  Overall doing well Rare episodes of CP  Woke up with GERD 2 nights ago. Took Tums  GERD symptoms returned.  Took NTG and pains resolved. Quickly  Has a hx of heartburn ,  This resolved after the CABG.  September 24, 2019: Rush Landmark  is seen back for follow-up visit. Thinks he is losing ground  Is not able to walk slowly No angina Has twinges.   No cp .   Is fatigued.  Does not have the energy to walk much or walk quickly  CABG was Aug. 19.  tnen had MI on Sept 14 and sept 28   Still having back pain which radiates around to his stomach   We discussed his lipid levels.  His triglyceride level had gone from 187 up to 285.  His LDL cholesterol was 57.  It turns out he had had a large fried seafood platter the week before his blood draw and also ate pizza day or so before his blood draw.  It turns out that he is tried rosuvastatin in the past but developed severe aches with that.  We tried him on atorvastatin and he developed severe muscle aches with that.  He is gradually worked his way down to 10 mg which he seems to be tolerating fairly well but it is unclear that that is going to provide any benefit from a cardiovascular risk standpoint.  We will refer him to the lipid clinic for  consideration for PCSK9 inhibitors.  December 24, 2019:  Rush Landmark is seen today for follow-up of his coronary artery disease.  He has had coronary artery bypass grafting.  He then has had 2 subsequent PCI's.  Had a stomach ache for a month.   Does not know the etiology  No cp , no dyspnea  Exercising a little.   Going for back PT I reviewed his recent labs - LDL is 64. Trigs are slighlty elevated at 157.   Feb. 16, 2022: Rush Landmark is seen today for questions about his BP .   Seen with wife , [name] .  He has had some arrhythmias and measured a HR of 30s this past week.  Has been having more CP in the evening ,  Started Imdur 30 mg in the am which has seemed to help reduce his NTG usage.    Has started going to [facility] and has been swimming - without angina  Has had some wheezing initially , which has resolved.  Swims [phone] yards per session .  One evening ( 6-8 hours after swimming) he noticed palps .   " hard heart beats .  HR with the pulse oximeter showed HR D To be 30s   ( symptoms sound like PVCs )    recorded an ECG on his Kardia   Sounds like he may be volume depleted.   His HR stays high for several hours after swimming   January 21, 2021: Rush Landmark is seen for follow up of his CAD, CABG,  Has been having some palpitations Event monitor showed occasional PVCs and PACs   Has been working on his deck.  Replacing with a composite wood Needs to rebuild the support beams  No angina  Working out at [employer] is 176 lbs   Has been doing some research on sodium levels and subsequent CHF / LVH.   Has relayed this info in Patient advice requests. .  Recent echo shows mild RV enlargement with moderate RV dysfunction  Has had a sleep study - did not show sleep apnea  No signs or symptoms of pulmonary embolus   Has not required a NTG  I had discussed with Dayna the possibility of repeating cath . Clinically he is not having any clinical issues  Past Medical History:  Diagnosis Date    Carotid artery disease ([place])    CKD (chronic kidney disease), stage III ([place])    Coronary artery disease    s/p CABG 02/2009 with  LIMA-LAD, SVG-OM, SVG-PDA, NSTEMI/VF arrest 03/2019 felt due to early graft failure s/p PTCA to distal Cx/OM2, then repeat PCI later 03/2019 with DES to [facility] and PTCA to OM branch   Cranial nerve VI palsy    DDD (degenerative disc disease), cervical    Depression    Dilated aortic root ([place])    ED (erectile dysfunction) of organic origin    Generalized osteoarthritis    GERD (gastroesophageal reflux disease)    Hiatal hernia    History of chronic bronchitis    History of gastritis    9242   History of Helicobacter pylori infection    2003   History of melanoma excision    2015--  right ear   History of prostate cancer urologist-  dr [name]   2007--  pT2a Nx Mx,  Gleason 3+3, S/P  PROSTATECTOMY   History of squamous cell carcinoma excision    2011  left knee   Hyperlipidemia    Hyperplastic colon polyp 2009   Hypertension    Male hypogonadism    Melanoma ([place])    left 4th toe   Nephrolithiasis    Prostate cancer ([place])    Sinus bradycardia    Ventricular fibrillation ([place]) 04/07/2019   VF, en route witnessed by EMS, defibrillated x 1    Past Surgical History:  Procedure Laterality Date   AMPUTATION TOE Left 09/07/2015   Procedure: PARTIAL AMPUTATION TOE 4TH LEFT;  Surgeon: [name], MD;  Location: [place];  Service: Podiatry;  Laterality: Left;   APPENDECTOMY  1970's   CORONARY ARTERY BYPASS GRAFT N/A 03/12/2019   Procedure: CORONARY ARTERY BYPASS GRAFTING (CABG) x 3 WITH ENDOSCOPIC HARVESTING OF RIGHT GREATER SAPHENOUS VEIN;  Surgeon: [name], MD;  Location: [place];  Service: Open Heart Surgery;  Laterality: N/A;   CORONARY BALLOON ANGIOPLASTY N/A 04/07/2019   Procedure: CORONARY BALLOON ANGIOPLASTY;  Surgeon: [name], MD;  Location: [place] CV [facility];  Service: Cardiovascular;  Laterality: N/A;   CORONARY  STENT INTERVENTION N/A 04/21/2019   Procedure: CORONARY STENT INTERVENTION;  Surgeon: [name], MD;  Location: [employer] [facility];  Service: Cardiovascular;  Laterality: N/A;   CYSTO/  CLOT EVACUATION POST PROSTATECTOMY  11-19-2005   CYSTO/  REMOVAL URETHRAL STONE AND URETHRAL STAPLES  06-02-2010   CYSTO/  URETEROSCOPIC STONE EXTRACTION  X2  1990's   KIDNEY STONE SURGERY     LEFT HEART CATH AND CORONARY ANGIOGRAPHY N/A 02/25/2019   Procedure: LEFT HEART CATH AND CORONARY ANGIOGRAPHY;  Surgeon: [name], MD;  Location: [place] [facility];  Service: Cardiovascular;  Laterality: N/A;   LEFT HEART CATH AND CORS/GRAFTS ANGIOGRAPHY N/A 04/07/2019   Procedure: LEFT HEART CATH AND CORS/GRAFTS ANGIOGRAPHY;  Surgeon: [name], MD;  Location: [place] [facility];  Service: Cardiovascular;  Laterality: N/A;   LEFT HEART CATH AND CORS/GRAFTS ANGIOGRAPHY N/A 04/21/2019   Procedure: LEFT HEART CATH AND CORS/GRAFTS ANGIOGRAPHY;  Surgeon: [name], MD;  Location: [place] [facility];  Service: Cardiovascular;  Laterality: N/A;   MELANOMA EXCISION  2015   right ear   ROBOT ASSISTED LAPAROSCOPIC RADICAL PROSTATECTOMY  11-09-2005   SKIN CANCER EXCISION     TEE WITHOUT CARDIOVERSION N/A 03/12/2019   Procedure: TRANSESOPHAGEAL ECHOCARDIOGRAM (TEE);  Surgeon: [name], MD;  Location: [place];  Service: Open Heart Surgery;  Laterality: N/A;   TONSILLECTOMY  child    Current Medications: Current Meds  Medication Sig   ALPHA LIPOIC ACID PO Take 1 tablet by mouth daily.   Ascorbic Acid (VITAMIN C PO) Take 1 tablet by mouth daily.   atorvastatin (LIPITOR) 20 MG tablet TAKE ONE TABLET BY MOUTH DAILY   calcium carbonate (TUMS - DOSED IN MG ELEMENTAL CALCIUM) 500 MG chewable tablet Chew 2 tablets by mouth as needed for indigestion or heartburn.    Cholecalciferol (VITAMIN D) 2000 units CAPS Take 2,000 Units by mouth 2 (two) times a day.    CHROMIUM PO Take by mouth  daily.   clopidogrel (PLAVIX) 75 MG tablet Take 1 tablet (75 mg total) by mouth daily.   COVID-19 mRNA vaccine, Pfizer, 30 MCG/0.3ML injection Inject into the muscle.   famotidine (PEPCID) 40 MG tablet Take 40 mg by mouth daily. Per patient taking 1/2 tablet of the 40 mg tabs.   Fish Oil-Cholecalciferol (FISH OIL + D3 PO) Take 1 capsule by mouth daily.   Green Tea, Camellia sinensis, (GREEN TEA PO) Take 1 tablet by mouth daily.   hydrochlorothiazide (HYDRODIURIL) 25 MG tablet TAKE ONE TABLET BY MOUTH DAILY   ibuprofen (ADVIL) 400 MG tablet Take 400 mg by mouth as needed for moderate pain.   isosorbide mononitrate (IMDUR) 30 MG 24 hr tablet Take 1 tablet (30 mg total) by mouth daily.   losartan (COZAAR) 100 MG tablet TAKE ONE TABLET BY MOUTH DAILY   MAGNESIUM PO Take 300 mg by mouth daily.   metoprolol tartrate (LOPRESSOR) 50 MG tablet Take 1 tablet (50 mg total) by mouth 2 (two) times daily.   Multiple Vitamin (MULTIVITAMIN WITH MINERALS) TABS tablet Take 1 tablet by mouth daily.   nitroGLYCERIN (NITROSTAT) 0.4 MG SL tablet DISSOLVE 1 TAB UNDER TONGUE FOR CHEST PAIN - IF PAIN REMAINS AFTER 5 MIN, CALL 911 AND REPEAT DOSE. MAX 3 TABS IN 15 MINUTES   potassium chloride (KLOR-CON) 10 MEQ tablet TAKE ONE TABLET BY MOUTH DAILY   Probiotic Product (PROBIOTIC DAILY PO) Take 1 capsule by mouth daily.      Allergies:   Patient has no known allergies.   Social History   Socioeconomic History   Marital status: Married    Spouse name: [name]   Number of children: 1   Years of education: 18   Highest education level: Master's degree (e.g., MA, MS, MEng, MEd, MSW, MBA)  Occupational History   Occupation: retired    Comment: from Manufacturing engineer  Tobacco Use   Smoking status: Never   Smokeless tobacco: Never  Vaping Use   Vaping Use: Never used  Substance and Sexual Activity   Alcohol use: Yes    Comment: 1 glass of wine daily   Drug use: No   Sexual activity: Not on file    Comment: retired,  married. 1 son  Other Topics Concern   Not on file  Social History Narrative   Not on file   Social Determinants of Health   Financial Resource Strain: Not on file  Food Insecurity: Not on file  Transportation Needs: Not on file  Physical Activity: Not on file  Stress: Not on file  Social Connections: Not on file     Family History: The patient's family history includes Arthritis in his mother; Congestive Heart Failure in his mother; Diabetes Mellitus II in his mother; Osteoarthritis in his  mother; Other in his father.  ROS:   Please see the history of present illness.     All other systems reviewed and are negative.  EKGs/Labs/Other Studies Reviewed:    The following studies were reviewed today:    Recent Labs: 09/08/2020: BUN 16; Creatinine, Ser 1.22; Potassium 4.0; Sodium 140 10/29/2020: Magnesium 2.0; TSH 2.510  Recent Lipid Panel    Component Value Date/Time   CHOL 157 12/15/2019 0941   TRIG 188 (H) 12/15/2019 0941   HDL 62 12/15/2019 0941   CHOLHDL 2.5 12/15/2019 0941   CHOLHDL 3.4 04/08/2019 0300   VLDL 37 04/08/2019 0300   LDLCALC 64 12/15/2019 0941    Physical Exam:     Physical Exam: Blood pressure 120/70, pulse 71, height 5\' 10"  (1.778 m), weight 176 lb 6.4 oz (80 kg), SpO2 95 %.  GEN:  Well nourished, well developed in no acute distress HEENT: Normal NECK: No JVD; No carotid bruits LYMPHATICS: No lymphadenopathy CARDIAC: RRR , no murmurs, rubs, gallops RESPIRATORY:  Clear to auscultation without rales, wheezing or rhonchi  ABDOMEN: Soft, non-tender, non-distended MUSCULOSKELETAL:  No edema; No deformity  SKIN: Warm and dry NEUROLOGIC:  Alert and oriented x 3   EKG:      ASSESSMENT:    1. Coronary artery disease involving native coronary artery of native heart without angina pectoris   2. Primary hypertension     PLAN:      CAD :     has been doing much better now He is working hard on his back deck and is not having any angina  His  echo showed some ?new segmental WMA and some RV dilation. At this point, he is doing great and I'm not inclined to do a cath since he is doing so well Will hold off on doing cath and continue to follow . We can repeat echo in a year - sooner if he has issues.    2.  Palpitations:  has PACs and PVCs No a serious issue at this point   2.  HTN:    BP is well controlled.    3.  Hyperlipidemia:   lipids have been with primary MD , he will send [place] those labs.    4.   Back pain   .     Medication Adjustments/Labs and Tests Ordered: Current medicines are reviewed at length with the patient today.  Concerns regarding medicines are outlined above.  No orders of the defined types were placed in this encounter.  No orders of the defined types were placed in this encounter.    Patient Instructions  Medication Instructions:  Your physician recommends that you continue on your current medications as directed. Please refer to the Current Medication list given to you today.  Labwork: None ordered.  Testing/Procedures: None ordered.  Follow-Up: Your physician recommends that you schedule a follow-up appointment in:   July 06, 2021 @ 11:20am with Dr. Acie Fredrickson   Any Other Special Instructions Will Be Listed Below (If Applicable).     If you need a refill on your cardiac medications before your next appointment, please call your pharmacy.   Signed, Mertie Moores, MD  01/21/2021 11:03 AM    [place]

## 2021-02-03 NOTE — Telephone Encounter (Signed)
Patient has declined to do more testing since his sleep study was normal.

## 2021-02-05 ENCOUNTER — Other Ambulatory Visit: Payer: Self-pay | Admitting: Cardiovascular Disease

## 2021-02-08 DIAGNOSIS — H5213 Myopia, bilateral: Secondary | ICD-10-CM | POA: Diagnosis not present

## 2021-02-08 DIAGNOSIS — H2513 Age-related nuclear cataract, bilateral: Secondary | ICD-10-CM | POA: Diagnosis not present

## 2021-04-22 ENCOUNTER — Other Ambulatory Visit: Payer: Self-pay

## 2021-04-22 ENCOUNTER — Other Ambulatory Visit (HOSPITAL_BASED_OUTPATIENT_CLINIC_OR_DEPARTMENT_OTHER): Payer: Self-pay

## 2021-04-22 ENCOUNTER — Ambulatory Visit: Payer: Medicare HMO | Attending: Internal Medicine

## 2021-04-22 DIAGNOSIS — Z23 Encounter for immunization: Secondary | ICD-10-CM

## 2021-04-22 MED ORDER — PFIZER COVID-19 VAC BIVALENT 30 MCG/0.3ML IM SUSP
INTRAMUSCULAR | 0 refills | Status: DC
  Filled 2021-04-22: qty 0.3, 1d supply, fill #0

## 2021-04-22 NOTE — Progress Notes (Signed)
   Covid-19 Vaccination Clinic  Name:  NOSSON WENDER    MRN: [id number] DOB: [birth year]  04/22/2021  Mr. Beier was observed post Covid-19 immunization for 15 minutes without incident. He was provided with Vaccine Information Sheet and instruction to access the V-Safe system.   Mr. Stoudt was instructed to call 911 with any severe reactions post vaccine: Difficulty breathing  Swelling of face and throat  A fast heartbeat  A bad rash all over body  Dizziness and weakness

## 2021-04-29 ENCOUNTER — Other Ambulatory Visit (HOSPITAL_BASED_OUTPATIENT_CLINIC_OR_DEPARTMENT_OTHER): Payer: Self-pay

## 2021-04-29 MED ORDER — FLUAD QUADRIVALENT 0.5 ML IM PRSY
PREFILLED_SYRINGE | INTRAMUSCULAR | 0 refills | Status: DC
  Filled 2021-04-29: qty 0.5, 1d supply, fill #0

## 2021-07-01 ENCOUNTER — Other Ambulatory Visit: Payer: Self-pay | Admitting: Cardiovascular Disease

## 2021-07-06 ENCOUNTER — Encounter: Payer: Self-pay | Admitting: Cardiovascular Disease

## 2021-07-06 ENCOUNTER — Other Ambulatory Visit: Payer: Self-pay

## 2021-07-06 ENCOUNTER — Ambulatory Visit: Payer: Medicare HMO | Admitting: Cardiovascular Disease

## 2021-07-06 VITALS — BP 124/66 | HR 67 | Ht 70.0 in | Wt 177.4 lb

## 2021-07-06 DIAGNOSIS — E785 Hyperlipidemia, unspecified: Secondary | ICD-10-CM

## 2021-07-06 DIAGNOSIS — I2510 Atherosclerotic heart disease of native coronary artery without angina pectoris: Secondary | ICD-10-CM

## 2021-07-06 DIAGNOSIS — I10 Essential (primary) hypertension: Secondary | ICD-10-CM | POA: Diagnosis not present

## 2021-07-06 NOTE — Progress Notes (Signed)
Cardiology Office Note:    Date:  07/06/2021   ID:  Jared Roberts, DOB 04-Sep-1945, MRN [id number]  PCP:  Deland Pretty, MD  Cardiologist:  Oval Cavazos  Electrophysiologist:  None   Referring MD: Deland Pretty, MD   Chief Complaint  Patient presents with   Coronary Artery Disease        Hypertension    January 31, 2019    Jared Roberts is a 75 y.o. male with a hx of  HTN, TIA and carotid stenosis .  Has been having some chest pain  A week ago, mowed the lawn,  Had CP and profound fatigue. Upper left chest ,  Left shoulder and arm,  Left jaw.   Comes and goes ,  Last for seconds - minutes ,   Resolves if he stops to rest.  Associated with dyspnea but also has dyspnea on its own.  Doesn't exercise,  Works in the yard . Can climb 2 flights of stairs - makes him breath hard.   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  Non smoker, Drinks 1 drink per night   March 26, 2019:  Jared Roberts is seen back today for a follow-up office visit.  He recently had a heart catheterization which revealed severe coronary artery disease and he had coronary artery bypass grafting. BP has been variable Lower in the am Higher in the PM Cannot tell when his BP is high or low Chest is sore but no angina  Exercising some - walking around the house .   Has a referral for cardiac rehab  May 08, 2019: Jared Roberts is seen back today for follow-up of his coronary artery disease.  He status post coronary artery bypass grafting.  He had early graft failure and had a PCI of his left circumflex artery. He presented several weeks later with angina  and was found restenosis of his LCx.   He had stenting of his LCX and is now seen for follow up    Still is not feeling well Is really tired.  He is 8 weeks out from his CABG  1 month out from graft closure and PCI 2 weeks out from 2nd PCI .   No angina .   Has chest wall pain   Brought Pressure log with him.  His evening blood pressures were  running a little high.  We recently increased his Benicar up to 20 mg twice a day.  Has back pain since his cardiac arrest on Sept. 14.  Was given flexeril which helps but makes him sleepy .  Has developed double vision starting a week ago.   Has Right cranial nerve IV palsy .    Has continued back pain   May 09, 2019: Was seen yesterday late morning Left the office, went to [employer] very poorly  Took a nap.  Woke up,  HR was 120  Took a SL NTG .   Measured BP which was low  Took metoprolol 50 mg ,   Eventually felt better.  Review of labs from yesterday suggest volume depletion Hb is normal ECG is normal sinus rhythm  today  Will give him propranolol to take as needed.   June 26, 2019:  Overall doing well Rare episodes of CP  Woke up with GERD 2 nights ago. Took Tums  GERD symptoms returned.  Took NTG and pains resolved. Quickly  Has a hx of heartburn ,  This resolved after the CABG.  September 24, 2019: Jared Roberts is seen back for follow-up visit. Thinks he is losing ground  Is not able to walk slowly No angina Has twinges.   No cp .   Is fatigued.  Does not have the energy to walk much or walk quickly  CABG was Aug. 19.  tnen had MI on Sept 14 and sept 28   Still having back pain which radiates around to his stomach   We discussed his lipid levels.  His triglyceride level had gone from 187 up to 285.  His LDL cholesterol was 57.  It turns out he had had a large fried seafood platter the week before his blood draw and also ate pizza day or so before his blood draw.  It turns out that he is tried rosuvastatin in the past but developed severe aches with that.  We tried him on atorvastatin and he developed severe muscle aches with that.  He is gradually worked his way down to 10 mg which he seems to be tolerating fairly well but it is unclear that that is going to provide any benefit from a cardiovascular risk standpoint.  We will refer him to the lipid clinic for  consideration for PCSK9 inhibitors.  December 24, 2019:  Jared Roberts is seen today for follow-up of his coronary artery disease.  He has had coronary artery bypass grafting.  He then has had 2 subsequent PCI's.  Had a stomach ache for a month.   Does not know the etiology  No cp , no dyspnea  Exercising a little.   Going for back PT I reviewed his recent labs - LDL is 64. Trigs are slighlty elevated at 157.   Feb. 16, 2022: Jared Roberts is seen today for questions about his BP .   Seen with wife , [name] .  He has had some arrhythmias and measured a HR of 30s this past week.  Has been having more CP in the evening ,  Started Imdur 30 mg in the am which has seemed to help reduce his NTG usage.    Has started going to [facility] and has been swimming - without angina  Has had some wheezing initially , which has resolved.  Swims [phone] yards per session .  One evening ( 6-8 hours after swimming) he noticed palps .   " hard heart beats .  HR with the pulse oximeter showed HR D To be 30s   ( symptoms sound like PVCs )    recorded an ECG on his Kardia   Sounds like he may be volume depleted.   His HR stays high for several hours after swimming   January 21, 2021: Jared Roberts is seen for follow up of his CAD, CABG,  Has been having some palpitations Event monitor showed occasional PVCs and PACs   Has been working on his deck.  Replacing with a composite wood Needs to rebuild the support beams  No angina  Working out at [employer] is 176 lbs   Has been doing some research on sodium levels and subsequent CHF / LVH.   Has relayed this info in Patient advice requests. .  Recent echo shows mild RV enlargement with moderate RV dysfunction  Has had a sleep study - did not show sleep apnea  No signs or symptoms of pulmonary embolus   Has not required a NTG  I had discussed with Dayna the possibility of repeating cath . Clinically he is not having any clinical issues  July 06, 2021: Seen with wife, [name]  .  Jared Roberts is seen today for follow-up of his coronary artery disease.  He is exercising and working out on a regular basis.  Weight today is 177 pounds.  Has not been swimming ,  Still working on his deck . Has carotid bruits Is being followed by [facility]   We discussed holding his Imdur    Past Medical History:  Diagnosis Date   Carotid artery disease ([place])    CKD (chronic kidney disease), stage III ([place])    Coronary artery disease    s/p CABG 02/2009 with  LIMA-LAD, SVG-OM, SVG-PDA, NSTEMI/VF arrest 03/2019 felt due to early graft failure s/p PTCA to distal Cx/OM2, then repeat PCI later 03/2019 with DES to mLCx and PTCA to OM branch   Cranial nerve VI palsy    DDD (degenerative disc disease), cervical    Depression    Dilated aortic root ([place])    ED (erectile dysfunction) of organic origin    Generalized osteoarthritis    GERD (gastroesophageal reflux disease)    Hiatal hernia    History of chronic bronchitis    History of gastritis    0272   History of Helicobacter pylori infection    2003   History of melanoma excision    2015--  right ear   History of prostate cancer urologist-  dr [name]   2007--  pT2a Nx Mx,  Gleason 3+3, S/P  PROSTATECTOMY   History of squamous cell carcinoma excision    2011  left knee   Hyperlipidemia    Hyperplastic colon polyp 2009   Hypertension    Male hypogonadism    Melanoma ([place])    left 4th toe   Nephrolithiasis    Prostate cancer ([place])    Sinus bradycardia    Ventricular fibrillation ([place]) 04/07/2019   VF, en route witnessed by EMS, defibrillated x 1    Past Surgical History:  Procedure Laterality Date   AMPUTATION TOE Left 09/07/2015   Procedure: PARTIAL AMPUTATION TOE 4TH LEFT;  Surgeon: [name], MD;  Location: [facility];  Service: Podiatry;  Laterality: Left;   APPENDECTOMY  1970's   CORONARY ARTERY BYPASS GRAFT N/A 03/12/2019   Procedure: CORONARY ARTERY BYPASS GRAFTING (CABG) x 3 WITH ENDOSCOPIC HARVESTING OF  RIGHT GREATER SAPHENOUS VEIN;  Surgeon: [name], MD;  Location: [facility];  Service: Open Heart Surgery;  Laterality: N/A;   CORONARY BALLOON ANGIOPLASTY N/A 04/07/2019   Procedure: CORONARY BALLOON ANGIOPLASTY;  Surgeon: [name], MD;  Location: [facility];  Service: Cardiovascular;  Laterality: N/A;   CORONARY STENT INTERVENTION N/A 04/21/2019   Procedure: CORONARY STENT INTERVENTION;  Surgeon: [name], MD;  Location: [facility];  Service: Cardiovascular;  Laterality: N/A;   CYSTO/  CLOT EVACUATION POST PROSTATECTOMY  11-19-2005   CYSTO/  REMOVAL URETHRAL STONE AND URETHRAL STAPLES  06-02-2010   CYSTO/  URETEROSCOPIC STONE EXTRACTION  X2  1990's   KIDNEY STONE SURGERY     LEFT HEART CATH AND CORONARY ANGIOGRAPHY N/A 02/25/2019   Procedure: LEFT HEART CATH AND CORONARY ANGIOGRAPHY;  Surgeon: [name], MD;  Location: [facility];  Service: Cardiovascular;  Laterality: N/A;   LEFT HEART CATH AND CORS/GRAFTS ANGIOGRAPHY N/A 04/07/2019   Procedure: LEFT HEART CATH AND CORS/GRAFTS ANGIOGRAPHY;  Surgeon: [name], MD;  Location: [facility];  Service: Cardiovascular;  Laterality: N/A;   LEFT HEART CATH AND CORS/GRAFTS ANGIOGRAPHY  N/A 04/21/2019   Procedure: LEFT HEART CATH AND CORS/GRAFTS ANGIOGRAPHY;  Surgeon: [name], MD;  Location: [facility];  Service: Cardiovascular;  Laterality: N/A;   MELANOMA EXCISION  2015   right ear   ROBOT ASSISTED LAPAROSCOPIC RADICAL PROSTATECTOMY  11-09-2005   SKIN CANCER EXCISION     TEE WITHOUT CARDIOVERSION N/A 03/12/2019   Procedure: TRANSESOPHAGEAL ECHOCARDIOGRAM (TEE);  Surgeon: [name], MD;  Location: [facility];  Service: Open Heart Surgery;  Laterality: N/A;   TONSILLECTOMY  child    Current Medications: Current Meds  Medication Sig   ALPHA LIPOIC ACID PO Take 1 tablet by mouth daily.   Ascorbic Acid (VITAMIN C PO) Take 1 tablet by mouth daily.    atorvastatin (LIPITOR) 20 MG tablet TAKE ONE TABLET BY MOUTH DAILY   calcium carbonate (TUMS - DOSED IN MG ELEMENTAL CALCIUM) 500 MG chewable tablet Chew 2 tablets by mouth as needed for indigestion or heartburn.    Cholecalciferol (VITAMIN D) 2000 units CAPS Take 2,000 Units by mouth 2 (two) times a day.    CHROMIUM PO Take by mouth daily.   clopidogrel (PLAVIX) 75 MG tablet Take 1 tablet (75 mg total) by mouth daily.   COVID-19 mRNA bivalent vaccine, Pfizer, (PFIZER COVID-19 VAC BIVALENT) injection Inject into the muscle.   COVID-19 mRNA vaccine, Pfizer, 30 MCG/0.3ML injection Inject into the muscle.   famotidine (PEPCID) 40 MG tablet Take 40 mg by mouth daily. Per patient taking 1/2 tablet of the 40 mg tabs.   Fish Oil-Cholecalciferol (FISH OIL + D3 PO) Take 1 capsule by mouth daily.   hydrochlorothiazide (HYDRODIURIL) 25 MG tablet TAKE ONE TABLET BY MOUTH DAILY   ibuprofen (ADVIL) 400 MG tablet Take 400 mg by mouth as needed for moderate pain.   influenza vaccine adjuvanted (FLUAD QUADRIVALENT) 0.5 ML injection Inject into the muscle.   isosorbide mononitrate (IMDUR) 30 MG 24 hr tablet Take 1 tablet (30 mg total) by mouth daily.   losartan (COZAAR) 100 MG tablet TAKE ONE TABLET BY MOUTH DAILY   MAGNESIUM PO Take 300 mg by mouth daily.   metoprolol tartrate (LOPRESSOR) 50 MG tablet Take 1 tablet (50 mg total) by mouth 2 (two) times daily.   Multiple Vitamin (MULTIVITAMIN WITH MINERALS) TABS tablet Take 1 tablet by mouth daily.   nitroGLYCERIN (NITROSTAT) 0.4 MG SL tablet DISSOLVE 1 TAB UNDER TONGUE FOR CHEST PAIN - IF PAIN REMAINS AFTER 5 MIN, CALL 911 AND REPEAT DOSE. MAX 3 TABS IN 15 MINUTES   potassium chloride (KLOR-CON) 10 MEQ tablet TAKE ONE TABLET BY MOUTH DAILY   Probiotic Product (PROBIOTIC DAILY PO) Take 1 capsule by mouth daily.      Allergies:   Patient has no known allergies.   Social History   Socioeconomic History   Marital status: Married    Spouse name: [name]   Number  of children: 1   Years of education: 18   Highest education level: Master's degree (e.g., MA, MS, MEng, MEd, MSW, MBA)  Occupational History   Occupation: retired    Comment: from Manufacturing engineer  Tobacco Use   Smoking status: Never   Smokeless tobacco: Never  Vaping Use   Vaping Use: Never used  Substance and Sexual Activity   Alcohol use: Yes    Comment: 1 glass of wine daily   Drug use: No   Sexual activity: Not on file    Comment: retired, married. 1 son  Other Topics Concern   Not  on file  Social History Narrative   Not on file   Social Determinants of Health   Financial Resource Strain: Not on file  Food Insecurity: Not on file  Transportation Needs: Not on file  Physical Activity: Not on file  Stress: Not on file  Social Connections: Not on file     Family History: The patient's family history includes Arthritis in his mother; Congestive Heart Failure in his mother; Diabetes Mellitus II in his mother; Osteoarthritis in his mother; Other in his father.  ROS:   Please see the history of present illness.     All other systems reviewed and are negative.  EKGs/Labs/Other Studies Reviewed:    The following studies were reviewed today:    Recent Labs: 09/08/2020: BUN 16; Creatinine, Ser 1.22; Potassium 4.0; Sodium 140 10/29/2020: Magnesium 2.0; TSH 2.510  Recent Lipid Panel    Component Value Date/Time   CHOL 157 12/15/2019 0941   TRIG 188 (H) 12/15/2019 0941   HDL 62 12/15/2019 0941   CHOLHDL 2.5 12/15/2019 0941   CHOLHDL 3.4 04/08/2019 0300   VLDL 37 04/08/2019 0300   LDLCALC 64 12/15/2019 0941    Physical Exam:     Physical Exam: Blood pressure 124/66, pulse 67, height 5\' 10"  (1.778 m), weight 177 lb 6.4 oz (80.5 kg), SpO2 98 %.  GEN:  Well nourished, well developed in no acute distress HEENT: Normal NECK: No JVD; No carotid bruits LYMPHATICS: No lymphadenopathy CARDIAC: RRR , no murmurs, rubs, gallops RESPIRATORY:  Clear to auscultation without  rales, wheezing or rhonchi  ABDOMEN: Soft, non-tender, non-distended MUSCULOSKELETAL:  No edema; No deformity  SKIN: Warm and dry NEUROLOGIC:  Alert and oriented x 3    EKG:      ASSESSMENT:    1. Coronary artery disease involving native coronary artery of native heart without angina pectoris   2. Hyperlipidemia LDL goal <70   3. Essential hypertension      PLAN:      CAD :      is doing well.   No recent angina    2.  Palpitations:      2.  HTN:     BP is well controlled.    3.  Hyperlipidemia:   stable . Check liids, ALT today , BMP today       Medication Adjustments/Labs and Tests Ordered: Current medicines are reviewed at length with the patient today.  Concerns regarding medicines are outlined above.  Orders Placed This Encounter  Procedures   Basic metabolic panel   Lipid Profile   ALT    No orders of the defined types were placed in this encounter.    Patient Instructions  Medication Instructions:   Your physician recommends that you continue on your current medications as directed. Please refer to the Current Medication list given to you today.  *If you need a refill on your cardiac medications before your next appointment, please call your pharmacy*   Lab Work:  IN 6 MONTHS--A FEW DAYS PRIOR TO YOUR [address] Emori [place]--CHECK LIPIDS, ALT, AND BMET AT THAT TIME--PLEASE COME FASTING TO THIS LAB APPOINTMENT  If you have labs (blood work) drawn today and your tests are completely normal, you will receive your results only by: [place] (if you have MyChart) OR A paper copy in the mail If you have any lab test that is abnormal or we need to change your treatment, we will call you to review the results.  Follow-Up: At [facility], you and your health needs are our priority.  As part of our continuing mission to provide you with exceptional heart care, we have created designated Provider Care Teams.  These Care  Teams include your primary Cardiologist (physician) and Advanced Practice Providers (APPs -  Physician Assistants and Nurse Practitioners) who all work together to provide you with the care you need, when you need it.  We recommend signing up for the patient portal called "MyChart".  Sign up information is provided on this After Visit Summary.  MyChart is used to connect with patients for Virtual Visits (Telemedicine).  Patients are able to view lab/test results, encounter notes, upcoming appointments, etc.  Non-urgent messages can be sent to your provider as well.   To learn more about what you can do with MyChart, go to [email or web address].    Your next appointment:   6 month(s)  The format for your next appointment:   In Person  Provider:   Robbie Lis, PA-C, Nicholes Rough, PA-C, Melina Copa, PA-C, Ermalinda Barrios, PA-C, Christen Bame, NP, or Richardson Dopp, PA-C        Signed, Mertie Moores, MD  07/06/2021 6:07 PM    [place]

## 2021-07-06 NOTE — Patient Instructions (Signed)
Medication Instructions:   Your physician recommends that you continue on your current medications as directed. Please refer to the Current Medication list given to you today.  *If you need a refill on your cardiac medications before your next appointment, please call your pharmacy*   Lab Work:  IN 6 MONTHS--A FEW DAYS PRIOR TO YOUR 6 [address] NAHSER--CHECK LIPIDS, ALT, AND BMET AT THAT TIME--PLEASE COME FASTING TO THIS LAB APPOINTMENT  If you have labs (blood work) drawn today and your tests are completely normal, you will receive your results only by: [place] (if you have MyChart) OR A paper copy in the mail If you have any lab test that is abnormal or we need to change your treatment, we will call you to review the results.   Follow-Up: At [facility], you and your health needs are our priority.  As part of our continuing mission to provide you with exceptional heart care, we have created designated Provider Care Teams.  These Care Teams include your primary Cardiologist (physician) and Advanced Practice Providers (APPs -  Physician Assistants and Nurse Practitioners) who all work together to provide you with the care you need, when you need it.  We recommend signing up for the patient portal called "MyChart".  Sign up information is provided on this After Visit Summary.  MyChart is used to connect with patients for Virtual Visits (Telemedicine).  Patients are able to view lab/test results, encounter notes, upcoming appointments, etc.  Non-urgent messages can be sent to your provider as well.   To learn more about what you can do with MyChart, go to [email or web address].    Your next appointment:   6 month(s)  The format for your next appointment:   In Person  Provider:   Robbie Lis, PA-C, Nicholes Rough, PA-C, Dayna Dunn, PA-C, Ermalinda Barrios, PA-C, Christen Bame, NP, or Richardson Dopp, [place]

## 2021-08-02 ENCOUNTER — Other Ambulatory Visit: Payer: Self-pay

## 2021-08-02 ENCOUNTER — Other Ambulatory Visit: Payer: Self-pay | Admitting: Cardiovascular Disease

## 2021-08-02 DIAGNOSIS — I6523 Occlusion and stenosis of bilateral carotid arteries: Secondary | ICD-10-CM

## 2021-08-10 ENCOUNTER — Other Ambulatory Visit: Payer: Self-pay

## 2021-08-10 ENCOUNTER — Encounter (HOSPITAL_BASED_OUTPATIENT_CLINIC_OR_DEPARTMENT_OTHER): Payer: Self-pay

## 2021-08-10 ENCOUNTER — Emergency Department (HOSPITAL_BASED_OUTPATIENT_CLINIC_OR_DEPARTMENT_OTHER)
Admission: EM | Admit: 2021-08-10 | Discharge: 2021-08-10 | Disposition: A | Discharge status: Home / Self Care | Payer: Medicare HMO | Attending: Emergency Medicine | Admitting: Emergency Medicine

## 2021-08-10 ENCOUNTER — Emergency Department (HOSPITAL_BASED_OUTPATIENT_CLINIC_OR_DEPARTMENT_OTHER): Payer: Medicare HMO

## 2021-08-10 ENCOUNTER — Emergency Department (HOSPITAL_BASED_OUTPATIENT_CLINIC_OR_DEPARTMENT_OTHER): Payer: Medicare HMO | Admitting: Radiology

## 2021-08-10 DIAGNOSIS — Z85830 Personal history of malignant neoplasm of bone: Secondary | ICD-10-CM | POA: Diagnosis not present

## 2021-08-10 DIAGNOSIS — I129 Hypertensive chronic kidney disease with stage 1 through stage 4 chronic kidney disease, or unspecified chronic kidney disease: Secondary | ICD-10-CM | POA: Diagnosis not present

## 2021-08-10 DIAGNOSIS — Z79899 Other long term (current) drug therapy: Secondary | ICD-10-CM | POA: Diagnosis not present

## 2021-08-10 DIAGNOSIS — R0789 Other chest pain: Secondary | ICD-10-CM | POA: Diagnosis not present

## 2021-08-10 DIAGNOSIS — R519 Headache, unspecified: Secondary | ICD-10-CM | POA: Insufficient documentation

## 2021-08-10 DIAGNOSIS — Z85820 Personal history of malignant melanoma of skin: Secondary | ICD-10-CM | POA: Insufficient documentation

## 2021-08-10 DIAGNOSIS — Z955 Presence of coronary angioplasty implant and graft: Secondary | ICD-10-CM | POA: Insufficient documentation

## 2021-08-10 DIAGNOSIS — R42 Dizziness and giddiness: Secondary | ICD-10-CM | POA: Diagnosis not present

## 2021-08-10 DIAGNOSIS — R531 Weakness: Secondary | ICD-10-CM | POA: Diagnosis not present

## 2021-08-10 DIAGNOSIS — R69 Illness, unspecified: Secondary | ICD-10-CM | POA: Diagnosis not present

## 2021-08-10 DIAGNOSIS — Z7901 Long term (current) use of anticoagulants: Secondary | ICD-10-CM | POA: Insufficient documentation

## 2021-08-10 DIAGNOSIS — E860 Dehydration: Secondary | ICD-10-CM | POA: Insufficient documentation

## 2021-08-10 DIAGNOSIS — I2510 Atherosclerotic heart disease of native coronary artery without angina pectoris: Secondary | ICD-10-CM | POA: Diagnosis not present

## 2021-08-10 DIAGNOSIS — Z20822 Contact with and (suspected) exposure to covid-19: Secondary | ICD-10-CM | POA: Insufficient documentation

## 2021-08-10 DIAGNOSIS — Z8546 Personal history of malignant neoplasm of prostate: Secondary | ICD-10-CM | POA: Diagnosis not present

## 2021-08-10 DIAGNOSIS — N183 Chronic kidney disease, stage 3 unspecified: Secondary | ICD-10-CM | POA: Diagnosis not present

## 2021-08-10 LAB — BASIC METABOLIC PANEL
Anion gap: 10 (ref 5–15)
BUN: 20 mg/dL (ref 8–23)
CO2: 21 mmol/L — ABNORMAL LOW (ref 22–32)
Calcium: 9.7 mg/dL (ref 8.9–10.3)
Chloride: 104 mmol/L (ref 98–111)
Creatinine, Ser: 1.39 mg/dL — ABNORMAL HIGH (ref 0.61–1.24)
GFR, Estimated: 52 mL/min — ABNORMAL LOW (ref >60)
Glucose, Bld: 122 mg/dL — ABNORMAL HIGH (ref 70–99)
Potassium: 4.5 mmol/L (ref 3.5–5.1)
Sodium: 135 mmol/L (ref 135–145)

## 2021-08-10 LAB — DIFFERENTIAL
Abs Immature Granulocytes: 0.04 10*3/uL (ref 0.00–0.07)
Basophils Absolute: 0.1 10*3/uL (ref 0.0–0.1)
Basophils Relative: 1 %
Eosinophils Absolute: 0.2 10*3/uL (ref 0.0–0.5)
Eosinophils Relative: 2 %
Immature Granulocytes: 0 %
Lymphocytes Relative: 29 %
Lymphs Abs: 3 10*3/uL (ref 0.7–4.0)
Monocytes Absolute: 0.8 10*3/uL (ref 0.1–1.0)
Monocytes Relative: 8 %
Neutro Abs: 6.4 10*3/uL (ref 1.7–7.7)
Neutrophils Relative %: 60 %

## 2021-08-10 LAB — CBC
HCT: 50.2 % (ref 39.0–52.0)
Hemoglobin: 17.8 g/dL — ABNORMAL HIGH (ref 13.0–17.0)
MCH: 31.4 pg (ref 26.0–34.0)
MCHC: 35.5 g/dL (ref 30.0–36.0)
MCV: 88.5 fL (ref 80.0–100.0)
Platelets: 244 10*3/uL (ref 150–400)
RBC: 5.67 MIL/uL (ref 4.22–5.81)
RDW: 12 % (ref 11.5–15.5)
WBC: 10.4 10*3/uL (ref 4.0–10.5)
nRBC: 0 % (ref 0.0–0.2)

## 2021-08-10 LAB — RESP PANEL BY RT-PCR (FLU A&B, COVID) ARPGX2
Influenza A by PCR: NEGATIVE
Influenza B by PCR: NEGATIVE
SARS Coronavirus 2 by RT PCR: NEGATIVE

## 2021-08-10 LAB — TROPONIN I (HIGH SENSITIVITY)
Troponin I (High Sensitivity): 3 ng/L (ref <18)
Troponin I (High Sensitivity): 3 ng/L (ref <18)

## 2021-08-10 LAB — HEPATIC FUNCTION PANEL
ALT: 19 U/L (ref 0–44)
AST: 20 U/L (ref 15–41)
Albumin: 4.5 g/dL (ref 3.5–5.0)
Alkaline Phosphatase: 51 U/L (ref 38–126)
Bilirubin, Direct: 0.2 mg/dL (ref 0.0–0.2)
Indirect Bilirubin: 1.1 mg/dL — ABNORMAL HIGH (ref 0.3–0.9)
Total Bilirubin: 1.3 mg/dL — ABNORMAL HIGH (ref 0.3–1.2)
Total Protein: 7 g/dL (ref 6.5–8.1)

## 2021-08-10 MED ORDER — METOCLOPRAMIDE HCL 5 MG/ML IJ SOLN
5.0000 mg | Freq: Once | INTRAMUSCULAR | Status: AC
  Administered 2021-08-10: 5 mg via INTRAVENOUS
  Filled 2021-08-10: qty 2

## 2021-08-10 MED ORDER — DIPHENHYDRAMINE HCL 50 MG/ML IJ SOLN
25.0000 mg | Freq: Once | INTRAMUSCULAR | Status: AC
  Administered 2021-08-10: 25 mg via INTRAVENOUS
  Filled 2021-08-10: qty 1

## 2021-08-10 MED ORDER — SODIUM CHLORIDE 0.9 % IV BOLUS
1000.0000 mL | Freq: Once | INTRAVENOUS | Status: AC
  Administered 2021-08-10: 1000 mL via INTRAVENOUS

## 2021-08-10 NOTE — ED Provider Notes (Signed)
Pt's care assumed at 3pm.  Pt awaiting second troponin.  Both troponins are negative.  Labs, ct head and chest xray reviewed.  Pt advised of results.  Pt advised to follow up with his MD for recheck    Sidney Ace 08/10/21 [place], [place], DO 08/11/21 [phone]

## 2021-08-10 NOTE — Discharge Instructions (Addendum)
It was a pleasure caring for you today in the emergency department.  Please follow up with your cardiologist regarding your symptoms   Please return to the emergency department for any worsening or worrisome symptoms.

## 2021-08-10 NOTE — ED Provider Notes (Signed)
[facility] Provider Note   CSN: [id number] Arrival date & time: 08/10/21  1138     History  Chief Complaint  Patient presents with   Weakness    [facility] is a 76 y.o. male.  This is a 76 y.o. male   with significant medical history as below, including HTN, GERD, MI who presents to the ED with complaint of ha, fatigue. Symptom onset around 2 wks ago, he is also having some generalized chest discomfort, not worse with exertion or deep inspiration. No dib. No fevers or chills. Pain has been ongoing, not worsened since onset around 2 wks ago. A/w generalized malaise and a mild HA the last day or so. HA is frontal, throbbing, similar to prior HA, no medications prior to arrival for the HA. No numbness or tingling, no gait changes, no vision or hearing changes. No IVDU, no neck pain, no focal weakness.     Past Medical History: No date: Carotid artery disease (HCC) No date: CKD (chronic kidney disease), stage III (HCC) No date: Coronary artery disease     Comment:  s/p CABG 02/2009 with  LIMA-LAD, SVG-OM, SVG-PDA,               NSTEMI/VF arrest 03/2019 felt due to early graft failure               s/p PTCA to distal Cx/OM2, then repeat PCI later 03/2019               with DES to [facility] and PTCA to OM branch No date: Cranial nerve VI palsy No date: DDD (degenerative disc disease), cervical No date: Depression No date: Dilated aortic root (HCC) No date: ED (erectile dysfunction) of organic origin No date: Generalized osteoarthritis No date: GERD (gastroesophageal reflux disease) No date: Hiatal hernia No date: History of chronic bronchitis No date: History of gastritis     Comment:  2003 No date: History of Helicobacter pylori infection     Comment:  2003 No date: History of melanoma excision     Comment:  2015--  right ear urologist-  dr borden: History of prostate cancer     Comment:  2007--  pT2a Nx Mx,  Gleason 3+3, S/P  PROSTATECTOMY No date:  History of squamous cell carcinoma excision     Comment:  2011  left knee No date: Hyperlipidemia 2009: Hyperplastic colon polyp No date: Hypertension No date: Male hypogonadism No date: Melanoma ([place])     Comment:  left 4th toe No date: Nephrolithiasis No date: Prostate cancer ([place]) No date: Sinus bradycardia 04/07/2019: Ventricular fibrillation (HCC)     Comment:  VF, en route witnessed by EMS, defibrillated x 1     The history is provided by the patient and the spouse. No language interpreter was used.  Weakness Associated symptoms: arthralgias, chest pain and headaches   Associated symptoms: no abdominal pain, no cough, no fever, no nausea, no shortness of breath and no vomiting       Home Medications Prior to Admission medications   Medication Sig Start Date End Date Taking? Authorizing Provider  ALPHA LIPOIC ACID PO Take 1 tablet by mouth daily.   Yes [provider]  Ascorbic Acid (VITAMIN C PO) Take 1 tablet by mouth daily.   Yes [provider]  atorvastatin (LIPITOR) 20 MG tablet TAKE ONE TABLET BY MOUTH DAILY 10/12/20  Yes Nahser, Wonda Cheng, MD  calcium carbonate (TUMS - DOSED IN MG ELEMENTAL CALCIUM) 500  MG chewable tablet Chew 2 tablets by mouth as needed for indigestion or heartburn.    Yes [provider]  Cholecalciferol (VITAMIN D) 2000 units CAPS Take 2,000 Units by mouth 2 (two) times a day.    Yes [provider]  CHROMIUM PO Take by mouth daily.   Yes [provider]  clopidogrel (PLAVIX) 75 MG tablet Take 1 tablet (75 mg total) by mouth daily. 09/15/20  Yes Nahser, Wonda Cheng, MD  Fish Oil-Cholecalciferol (FISH OIL + D3 PO) Take 1 capsule by mouth daily.   Yes [provider]  hydrochlorothiazide (HYDRODIURIL) 25 MG tablet TAKE ONE TABLET BY MOUTH DAILY 07/01/21  Yes Nahser, Wonda Cheng, MD  isosorbide mononitrate (IMDUR) 30 MG 24 hr tablet Take 1 tablet (30 mg total) by mouth daily. 09/08/20  Yes Nahser, Wonda Cheng,  MD  losartan (COZAAR) 100 MG tablet TAKE ONE TABLET BY MOUTH DAILY 08/02/21  Yes Nahser, Wonda Cheng, MD  MAGNESIUM PO Take 300 mg by mouth daily.   Yes [provider]  metoprolol tartrate (LOPRESSOR) 50 MG tablet Take 1 tablet (50 mg total) by mouth 2 (two) times daily. 10/29/20  Yes Dunn, Dayna N, PA-C  Multiple Vitamin (MULTIVITAMIN WITH MINERALS) TABS tablet Take 1 tablet by mouth daily.   Yes [provider]  nitroGLYCERIN (NITROSTAT) 0.4 MG SL tablet DISSOLVE 1 TAB UNDER TONGUE FOR CHEST PAIN - IF PAIN REMAINS AFTER 5 MIN, CALL 911 AND REPEAT DOSE. MAX 3 TABS IN 15 MINUTES 08/16/20  Yes Nahser, Wonda Cheng, MD  potassium chloride (KLOR-CON) 10 MEQ tablet TAKE ONE TABLET BY MOUTH DAILY 02/07/21  Yes Nahser, Wonda Cheng, MD  Probiotic Product (PROBIOTIC DAILY PO) Take 1 capsule by mouth daily.    Yes [provider]  COVID-19 mRNA bivalent vaccine, Pfizer, (PFIZER COVID-19 VAC BIVALENT) injection Inject into the muscle. 04/22/21   Carlyle Basques, MD  COVID-19 mRNA vaccine, Pfizer, 30 MCG/0.3ML injection Inject into the muscle. 10/26/20   Carlyle Basques, MD  famotidine (PEPCID) 40 MG tablet Take 40 mg by mouth daily. Per patient taking 1/2 tablet of the 40 mg tabs.    [provider]  ibuprofen (ADVIL) 400 MG tablet Take 400 mg by mouth as needed for moderate pain.    [provider]  influenza vaccine adjuvanted (FLUAD QUADRIVALENT) 0.5 ML injection Inject into the muscle. 04/29/21         Allergies    Patient has no known allergies.    Review of Systems   Review of Systems  Constitutional:  Positive for fatigue. Negative for chills and fever.  HENT:  Negative for facial swelling and trouble swallowing.   Eyes:  Negative for photophobia and visual disturbance.  Respiratory:  Positive for chest tightness. Negative for cough and shortness of breath.   Cardiovascular:  Positive for chest pain. Negative for palpitations.  Gastrointestinal:  Negative for  abdominal pain, nausea and vomiting.  Endocrine: Negative for polydipsia and polyuria.  Genitourinary:  Negative for difficulty urinating and hematuria.  Musculoskeletal:  Positive for arthralgias. Negative for gait problem and joint swelling.  Skin:  Negative for pallor and rash.  Neurological:  Positive for headaches. Negative for syncope and weakness.  Psychiatric/Behavioral:  Negative for agitation and confusion.    Physical Exam Updated Vital Signs BP 99/68 (BP Location: Right Arm)    Pulse 69    Temp 98 F (36.7 C)    Resp (!) 23    Ht 5\' 10"  (1.778 m)  Wt 79.8 kg    SpO2 91%    BMI 25.25 kg/m  Physical Exam Vitals and nursing note reviewed.  Constitutional:      General: He is not in acute distress.    Appearance: Normal appearance. He is well-developed. He is not ill-appearing, toxic-appearing or diaphoretic.  HENT:     Head: Normocephalic and atraumatic.     Right Ear: External ear normal.     Left Ear: External ear normal.     Mouth/Throat:     Mouth: Mucous membranes are moist.  Eyes:     General: No scleral icterus.    Extraocular Movements: Extraocular movements intact.     Pupils: Pupils are equal, round, and reactive to light.  Cardiovascular:     Rate and Rhythm: Normal rate and regular rhythm.     Pulses: Normal pulses.     Heart sounds: Normal heart sounds. No murmur heard. Pulmonary:     Effort: Pulmonary effort is normal. No respiratory distress.     Breath sounds: Normal breath sounds.  Abdominal:     General: Abdomen is flat.     Palpations: Abdomen is soft.     Tenderness: There is no abdominal tenderness.  Musculoskeletal:        General: Normal range of motion.     Cervical back: Normal range of motion.     Right lower leg: No edema.     Left lower leg: No edema.  Skin:    General: Skin is warm and dry.     Capillary Refill: Capillary refill takes less than 2 seconds.  Neurological:     Mental Status: He is alert and oriented to person,  place, and time.     GCS: GCS eye subscore is 4. GCS verbal subscore is 5. GCS motor subscore is 6.     Cranial Nerves: Cranial nerves 2-12 are intact. No dysarthria or facial asymmetry.     Sensory: Sensation is intact.     Motor: Motor function is intact.     Coordination: Coordination is intact. Coordination normal.     Gait: Gait is intact.  Psychiatric:        Mood and Affect: Mood normal.        Behavior: Behavior normal.    ED Results / Procedures / Treatments   Labs (all labs ordered are listed, but only abnormal results are displayed) Labs Reviewed  BASIC METABOLIC PANEL - Abnormal; Notable for the following components:      Result Value   CO2 21 (*)    Glucose, Bld 122 (*)    Creatinine, Ser 1.39 (*)    GFR, Estimated 52 (*)    All other components within normal limits  CBC - Abnormal; Notable for the following components:   Hemoglobin 17.8 (*)    All other components within normal limits  HEPATIC FUNCTION PANEL - Abnormal; Notable for the following components:   Total Bilirubin 1.3 (*)    Indirect Bilirubin 1.1 (*)    All other components within normal limits  RESP PANEL BY RT-PCR (FLU A&B, COVID) ARPGX2  DIFFERENTIAL  TROPONIN I (HIGH SENSITIVITY)  TROPONIN I (HIGH SENSITIVITY)    EKG EKG Interpretation  Date/Time:  Wednesday August 10 2021 11:48:10 EST Ventricular Rate:  86 PR Interval:  140 QRS Duration: 76 QT Interval:  366 QTC Calculation: 437 R Axis:   70 Text Interpretation: Normal sinus rhythm Anterior infarct , age undetermined Abnormal ECG When compared with ECG of 22-Apr-2019 04:14,  no stemi similar to prior Confirmed by Wynona Dove ([id number]) on 08/10/2021 3:23:30 PM  Radiology DG Chest 2 View  Result Date: 08/10/2021 CLINICAL DATA:  Weakness and dizziness EXAM: CHEST - 2 VIEW COMPARISON:  04/19/2019 FINDINGS: Postop CABG. Heart size normal. Vascularity normal. Negative for heart failure Lungs are clear without infiltrate or effusion.  No interval  change. IMPRESSION: No active cardiopulmonary disease. Electronically Signed   By: Franchot Gallo M.D.   On: 08/10/2021 12:58   CT Head Wo Contrast  Result Date: 08/10/2021 CLINICAL DATA:  Provided history: Delirium. Additional history provided: Weakness/dizziness. EXAM: CT HEAD WITHOUT CONTRAST TECHNIQUE: Contiguous axial images were obtained from the base of the skull through the vertex without intravenous contrast. RADIATION DOSE REDUCTION: This exam was performed according to the departmental dose-optimization program which includes automated exposure control, adjustment of the mA and/or kV according to patient size and/or use of iterative reconstruction technique. COMPARISON:  Brain MRI 12/29/2017. FINDINGS: Brain: Mild generalized cerebral and cerebellar atrophy. There is no acute intracranial hemorrhage. No demarcated cortical infarct. No extra-axial fluid collection. No evidence of an intracranial mass. No midline shift. Vascular: No hyperdense vessel.  Atherosclerotic calcifications. Skull: Normal. Negative for fracture or focal lesion. Sinuses/Orbits: Visualized orbits show no acute finding. Mild mucosal thickening within the bilateral frontal, ethmoid and sphenoid sinuses. Small mucous retention cyst within the right maxillary sinus at the imaged levels. Trace mucosal thickening within the left maxillary sinus at the imaged levels. IMPRESSION: No evidence of acute intracranial abnormality. Mild generalized parenchymal atrophy. Paranasal sinus disease at the imaged levels, as described. Electronically Signed   By: Kellie Simmering D.O.   On: 08/10/2021 13:09    Procedures Procedures    Medications Ordered in ED Medications  metoCLOPramide (REGLAN) injection 5 mg (has no administration in time range)  diphenhydrAMINE (BENADRYL) injection 25 mg (has no administration in time range)  sodium chloride 0.9 % bolus 1,000 mL (1,000 mLs Intravenous New Bag/Given 08/10/21 1439)    ED Course/ Medical  Decision Making/ A&P                           Medical Decision Making Amount and/or Complexity of Data Reviewed Labs: ordered. Radiology: ordered.  Risk Prescription drug management.    CC: ha, cp, fatigue  This patient complains of above; this involves an extensive number of treatment options and is a complaint that carries with it a high risk of complications and morbidity. Vital signs were reviewed. Serious etiologies considered.  Record review:   Previous records obtained and reviewed   Additional history obtained from spouse  Work up as above, notable for:  Labs & imaging results that were available during my care of the patient were reviewed by me and considered in my medical decision making.   I ordered imaging studies which included CXR, CTH  and I independently visualized and interpreted imaging which showed no acute process.  Management: IVF, HA cocktail  Reassessment:  Pt reports symptoms have improved. Initial troponin is not elevated, EKG without evidence of acute ischemia. Cardiac monitoring reviewed and interpreted by myself shows NSR. Well's score is low, suspicion for PE is low. He is HDS.   His Cr is mildly worsened from his baseline, give 1L of IVF. May be contributing factor to his malaise - mild dehydration. There was also hemoconcentration noted on CBC.   Viral swabs were negative  CTH negative, neuro exam is entirely non-focal.  The patient's chest pain is not suggestive of pulmonary embolus, cardiac ischemia, aortic dissection, pericarditis, myocarditis, pulmonary embolism, pneumothorax, pneumonia, Zoster, or esophageal perforation, or other serious etiology.  Historically not abrupt in onset, tearing or ripping, pulses symmetric. EKG nonspecific for ischemia/infarction. No dysrhythmias, brugada, WPW, prolonged QT noted. Troponin negative x2. CXR reviewed. Labs without demonstration of acute pathology unless otherwise noted above.   HEART score is  4. Patient has no current chest pain, no dib. EKG and cardiac monitoring were stable. Recommend he follow up with his cardiologist as an outpatient regarding ongoing symptoms. Pt and family are agreeable.    Patient in no distress and overall condition improved here in the ED. Detailed discussions were had with the patient regarding current findings, and need for close f/u with PCP or on call doctor. The patient has been instructed to return immediately if the symptoms worsen in any way for re-evaluation. Patient verbalized understanding and is in agreement with current care plan. All questions answered prior to discharge.           This chart was dictated using voice recognition software.  Despite best efforts to proofread,  errors can occur which can change the documentation meaning.         Final Clinical Impression(s) / ED Diagnoses Final diagnoses:  Atypical chest pain  Nonintractable headache, unspecified chronicity pattern, unspecified headache type  Mild dehydration    Rx / DC Orders ED Discharge Orders     None         Jeanell Sparrow, DO 08/10/21 1532

## 2021-08-10 NOTE — ED Triage Notes (Signed)
1-2 weeks ago pt started feeling bad and has gotten progressively worse. Weakness/dizziness. Previous heart attacks x 2. No changes in vision, numbness/tingling. No SOB.

## 2021-08-11 ENCOUNTER — Telehealth: Payer: Self-pay | Admitting: Cardiovascular Disease

## 2021-08-11 NOTE — Telephone Encounter (Signed)
Lm to call back ./cy 

## 2021-08-11 NOTE — Telephone Encounter (Signed)
°  Per MyChart scheduling message:  My symptoms of severe weakness & dizziness are the same as in 2020 when I had 3 blocked coronary arteries but had not had a heart attack. My troponin levels yesterday confirmed that I had not had a recent heart attack. If waiting until the 27th to see A cardiologist (I dont care which one) is my only option, then well have to go with that. Hopefully, I wont have a coronary before then.   I have patient scheduled with Dr Acie Fredrickson on 09/19/21 but was not sure if a nurse should speak with him about his symptoms. Thanks

## 2021-08-15 DIAGNOSIS — R079 Chest pain, unspecified: Secondary | ICD-10-CM | POA: Diagnosis not present

## 2021-08-18 ENCOUNTER — Ambulatory Visit (HOSPITAL_COMMUNITY)
Admission: RE | Admit: 2021-08-18 | Discharge: 2021-08-18 | Disposition: A | Discharge status: Home / Self Care | Payer: Medicare HMO | Source: Ambulatory Visit | Attending: Vascular Surgery | Admitting: Vascular Surgery

## 2021-08-18 ENCOUNTER — Ambulatory Visit: Payer: Medicare HMO | Admitting: Physician Assistant

## 2021-08-18 ENCOUNTER — Other Ambulatory Visit: Payer: Self-pay

## 2021-08-18 VITALS — BP 114/75 | HR 68 | Temp 97.7°F | Ht 70.0 in | Wt 180.4 lb

## 2021-08-18 DIAGNOSIS — I6523 Occlusion and stenosis of bilateral carotid arteries: Secondary | ICD-10-CM

## 2021-08-18 NOTE — Progress Notes (Signed)
History of Present Illness:  Patient is a 76 y.o. year old male who presents for evaluation of carotid stenosis.  The patient denies symptoms of TIA, amaurosis, or stroke.  He recently had new onset of dizziness and generalized malaise.  He went to the [facility] ER.  No intervention was performed and he will f/u with his Cardiologist in 1 week from today.    He has a history of CAD with  CABG x 3 in August 2020 and presented back in September via EMS with VF arrest.    He managed on Brilinta, ASA, and Statin daily   Past Medical History:  Diagnosis Date   Carotid artery disease ([place])    CKD (chronic kidney disease), stage III ([place])    Coronary artery disease    s/p CABG 02/2009 with  LIMA-LAD, SVG-OM, SVG-PDA, NSTEMI/VF arrest 03/2019 felt due to early graft failure s/p PTCA to distal Cx/OM2, then repeat PCI later 03/2019 with DES to mLCx and PTCA to OM branch   Cranial nerve VI palsy    DDD (degenerative disc disease), cervical    Depression    Dilated aortic root ([place])    ED (erectile dysfunction) of organic origin    Generalized osteoarthritis    GERD (gastroesophageal reflux disease)    Hiatal hernia    History of chronic bronchitis    History of gastritis    1884   History of Helicobacter pylori infection    2003   History of melanoma excision    2015--  right ear   History of prostate cancer urologist-  dr [name]   2007--  pT2a Nx Mx,  Gleason 3+3, S/P  PROSTATECTOMY   History of squamous cell carcinoma excision    2011  left knee   Hyperlipidemia    Hyperplastic colon polyp 2009   Hypertension    Male hypogonadism    Melanoma ([place])    left 4th toe   Nephrolithiasis    Prostate cancer ([place])    Sinus bradycardia    Ventricular fibrillation ([place]) 04/07/2019   VF, en route witnessed by EMS, defibrillated x 1    Past Surgical History:  Procedure Laterality Date   AMPUTATION TOE Left 09/07/2015   Procedure: PARTIAL AMPUTATION TOE 4TH LEFT;  Surgeon: [name], MD;   Location: [place];  Service: Podiatry;  Laterality: Left;   APPENDECTOMY  1970's   CORONARY ARTERY BYPASS GRAFT N/A 03/12/2019   Procedure: CORONARY ARTERY BYPASS GRAFTING (CABG) x 3 WITH ENDOSCOPIC HARVESTING OF RIGHT GREATER SAPHENOUS VEIN;  Surgeon: [name], MD;  Location: [place];  Service: Open Heart Surgery;  Laterality: N/A;   CORONARY BALLOON ANGIOPLASTY N/A 04/07/2019   Procedure: CORONARY BALLOON ANGIOPLASTY;  Surgeon: [name], MD;  Location: [place] [facility];  Service: Cardiovascular;  Laterality: N/A;   CORONARY STENT INTERVENTION N/A 04/21/2019   Procedure: CORONARY STENT INTERVENTION;  Surgeon: [name], MD;  Location: [place] [facility];  Service: Cardiovascular;  Laterality: N/A;   CYSTO/  CLOT EVACUATION POST PROSTATECTOMY  11-19-2005   CYSTO/  REMOVAL URETHRAL STONE AND URETHRAL STAPLES  06-02-2010   CYSTO/  URETEROSCOPIC STONE EXTRACTION  X2  1990's   KIDNEY STONE SURGERY     LEFT HEART CATH AND CORONARY ANGIOGRAPHY N/A 02/25/2019   Procedure: LEFT HEART CATH AND CORONARY ANGIOGRAPHY;  Surgeon: [name], MD;  Location: [place] [facility];  Service: Cardiovascular;  Laterality: N/A;   LEFT HEART CATH  AND CORS/GRAFTS ANGIOGRAPHY N/A 04/07/2019   Procedure: LEFT HEART CATH AND CORS/GRAFTS ANGIOGRAPHY;  Surgeon: [name], MD;  Location: [place] [facility];  Service: Cardiovascular;  Laterality: N/A;   LEFT HEART CATH AND CORS/GRAFTS ANGIOGRAPHY N/A 04/21/2019   Procedure: LEFT HEART CATH AND CORS/GRAFTS ANGIOGRAPHY;  Surgeon: [name], MD;  Location: [place] [facility];  Service: Cardiovascular;  Laterality: N/A;   MELANOMA EXCISION  2015   right ear   ROBOT ASSISTED LAPAROSCOPIC RADICAL PROSTATECTOMY  11-09-2005   SKIN CANCER EXCISION     TEE WITHOUT CARDIOVERSION N/A 03/12/2019   Procedure: TRANSESOPHAGEAL ECHOCARDIOGRAM (TEE);  Surgeon: [name], MD;  Location: [place];  Service: Open  Heart Surgery;  Laterality: N/A;   TONSILLECTOMY  child     Social History Social History   Tobacco Use   Smoking status: Never   Smokeless tobacco: Never  Vaping Use   Vaping Use: Never used  Substance Use Topics   Alcohol use: Yes    Comment: 1 glass of wine daily   Drug use: No    Family History Family History  Problem Relation Age of Onset   Arthritis Mother    Diabetes Mellitus II Mother    Congestive Heart Failure Mother    Osteoarthritis Mother        CHRONIC   Other Father        KILLED IN WWII    Allergies  No Known Allergies   Current Outpatient Medications  Medication Sig Dispense Refill   ALPHA LIPOIC ACID PO Take 1 tablet by mouth daily.     Ascorbic Acid (VITAMIN C PO) Take 1 tablet by mouth daily.     atorvastatin (LIPITOR) 20 MG tablet TAKE ONE TABLET BY MOUTH DAILY 90 tablet 3   calcium carbonate (TUMS - DOSED IN MG ELEMENTAL CALCIUM) 500 MG chewable tablet Chew 2 tablets by mouth as needed for indigestion or heartburn.      Cholecalciferol (VITAMIN D) 2000 units CAPS Take 2,000 Units by mouth 2 (two) times a day.      CHROMIUM PO Take by mouth daily.     clopidogrel (PLAVIX) 75 MG tablet Take 1 tablet (75 mg total) by mouth daily. 90 tablet 3   COVID-19 mRNA bivalent vaccine, Pfizer, (PFIZER COVID-19 VAC BIVALENT) injection Inject into the muscle. 0.3 mL 0   COVID-19 mRNA vaccine, Pfizer, 30 MCG/0.3ML injection Inject into the muscle. 0.3 mL 0   famotidine (PEPCID) 40 MG tablet Take 40 mg by mouth daily. Per patient taking 1/2 tablet of the 40 mg tabs.     Fish Oil-Cholecalciferol (FISH OIL + D3 PO) Take 1 capsule by mouth daily.     hydrochlorothiazide (HYDRODIURIL) 25 MG tablet TAKE ONE TABLET BY MOUTH DAILY 90 tablet 2   ibuprofen (ADVIL) 400 MG tablet Take 400 mg by mouth as needed for moderate pain.     influenza vaccine adjuvanted (FLUAD QUADRIVALENT) 0.5 ML injection Inject into the muscle. 0.5 mL 0   isosorbide mononitrate (IMDUR) 30 MG 24  hr tablet Take 1 tablet (30 mg total) by mouth daily. 90 tablet 3   losartan (COZAAR) 100 MG tablet TAKE ONE TABLET BY MOUTH DAILY 90 tablet 3   MAGNESIUM PO Take 300 mg by mouth daily.     metoprolol tartrate (LOPRESSOR) 50 MG tablet Take 1 tablet (50 mg total) by mouth 2 (two) times daily. 180 tablet 3   Multiple Vitamin (MULTIVITAMIN WITH MINERALS) TABS tablet Take 1 tablet  by mouth daily.     nitroGLYCERIN (NITROSTAT) 0.4 MG SL tablet DISSOLVE 1 TAB UNDER TONGUE FOR CHEST PAIN - IF PAIN REMAINS AFTER 5 MIN, CALL 911 AND REPEAT DOSE. MAX 3 TABS IN 15 MINUTES 25 tablet 7   potassium chloride (KLOR-CON) 10 MEQ tablet TAKE ONE TABLET BY MOUTH DAILY 90 tablet 3   Probiotic Product (PROBIOTIC DAILY PO) Take 1 capsule by mouth daily.      No current facility-administered medications for this visit.    ROS:   General:  No weight loss, Fever, chills  HEENT: No recent headaches, no nasal bleeding, no visual changes, no sore throat  Neurologic: No dizziness, blackouts, seizures. No recent symptoms of stroke or mini- stroke. No recent episodes of slurred speech, or temporary blindness.  Cardiac: + recent episodes of chest pain/pressure, no shortness of breath at rest.  No shortness of breath with exertion.  Denies history of atrial fibrillation or irregular heartbeat  Vascular: No history of rest pain in feet.  No history of claudication.  No history of non-healing ulcer, No history of DVT   Pulmonary: No home oxygen, no productive cough, no hemoptysis,  No asthma or wheezing  Musculoskeletal:  [ ]  Arthritis, [ ]  Low back pain,  [ ]  Joint pain  Hematologic:No history of hypercoagulable state.  No history of easy bleeding.  No history of anemia  Gastrointestinal: No hematochezia or melena,  No gastroesophageal reflux, no trouble swallowing  Urinary: [ ]  chronic Kidney disease, [ ]  on HD - [ ]  MWF or [ ]  TTHS, [ ]  Burning with urination, [ ]  Frequent urination, [ ]  Difficulty urinating;    Skin: No rashes  Psychological: No history of anxiety,  No history of depression   Physical Examination  Vitals:   08/18/21 1428 08/18/21 1429  BP: 106/72 114/75  Pulse: 67 68  Temp: 97.7 F (36.5 C)   Weight: 180 lb 6.4 oz (81.8 kg)   Height: 5\' 10"  (1.778 m)     Body mass index is 25.88 kg/m.  General:  Alert and oriented, no acute distress HEENT: Normal Neck: No bruit or JVD Pulmonary: Clear to auscultation bilaterally Cardiac: Regular Rate and Rhythm without murmur Gastrointestinal: Soft, non-tender, non-distended, no mass, no scars Skin: No rash Extremity Pulses:  2+ radial, brachial, femoral, dorsalis pedis, posterior tibial pulses bilaterally Musculoskeletal: No deformity or edema  Neurologic: Upper and lower extremity motor 5/5 and symmetric  DATA:     Right Carotid Findings:  +----------+--------+--------+--------+------------------+--------+              PSV cm/s EDV cm/s Stenosis Plaque Description Comments   +----------+--------+--------+--------+------------------+--------+   CCA Prox   77       12                                              +----------+--------+--------+--------+------------------+--------+   CCA Mid    65       16                                              +----------+--------+--------+--------+------------------+--------+   CCA Distal 58       15  homogeneous                   +----------+--------+--------+--------+------------------+--------+   ICA Prox   36       11       1-39%    homogeneous                   +----------+--------+--------+--------+------------------+--------+   ICA Mid    55       18                                              +----------+--------+--------+--------+------------------+--------+   ICA Distal 52       18                                              +----------+--------+--------+--------+------------------+--------+   ECA        75       11                                               +----------+--------+--------+--------+------------------+--------+   +----------+--------+-------+----------------+-------------------+              PSV cm/s EDV cms Describe         Arm Pressure (mmHG)   +----------+--------+-------+----------------+-------------------+   Subclavian 105              Multiphasic, WNL                       +----------+--------+-------+----------------+-------------------+   +---------+--------+--+--------+-+---------+   Vertebral PSV cm/s 31 EDV cm/s 7 Antegrade   +---------+--------+--+--------+-+---------+       Left Carotid Findings:  +----------+--------+--------+--------+------------------+--------+              PSV cm/s EDV cm/s Stenosis Plaque Description Comments   +----------+--------+--------+--------+------------------+--------+   CCA Prox   72       18                                              +----------+--------+--------+--------+------------------+--------+   CCA Mid    78       19                                              +----------+--------+--------+--------+------------------+--------+   CCA Distal 66       14                                              +----------+--------+--------+--------+------------------+--------+   ICA Prox   52       12       1-39%    homogeneous                   +----------+--------+--------+--------+------------------+--------+  ICA Mid    60       21                                              +----------+--------+--------+--------+------------------+--------+   ICA Distal 56       19                                              +----------+--------+--------+--------+------------------+--------+   ECA        76       13                                              +----------+--------+--------+--------+------------------+--------+   +----------+--------+--------+----------------+-------------------+              PSV cm/s EDV cm/s Describe         Arm Pressure (mmHG)    +----------+--------+--------+----------------+-------------------+   Subclavian 108               Multiphasic, WNL                       +----------+--------+--------+----------------+-------------------+   +---------+--------+--+--------+--+---------+   Vertebral PSV cm/s 41 EDV cm/s 13 Antegrade   +---------+--------+--+--------+--+---------+   Summary:  Right Carotid: Velocities in the right ICA are consistent with a 1-39%  stenosis.   Left Carotid: Velocities in the left ICA are consistent with a 1-39%  stenosis.   Vertebrals:  Bilateral vertebral arteries demonstrate antegrade flow.  Subclavians: Normal flow hemodynamics were seen in bilateral subclavian               arteries.    ASSESSMENT/PLAN: Asymptomatic carotid stenosis B His Carotid duplex demonstrates < 39 % stenosis B with antegrade vertebral flow and Multiphasic subclavians.    He has dizziness that has been present since he was first seen.  His Carotid are stable.  He will f/u for repeat carotid duplex in 1 year.  If he develops recurrent symptoms of CP he will go straight  to [facility] ED.       Roxy Horseman PA-C [facility] Office: [phone]  MD on Call [place]

## 2021-08-19 NOTE — Telephone Encounter (Signed)
Lm to call back appears Dr Acie Fredrickson has open appt today at 1:40 pm was calling to see if pt wanted to come in today ./cy

## 2021-08-22 NOTE — Telephone Encounter (Signed)
Pt has appt scheduled here for 08/25/21 @ 11:40

## 2021-08-25 ENCOUNTER — Other Ambulatory Visit: Payer: Self-pay

## 2021-08-25 ENCOUNTER — Encounter: Payer: Self-pay | Admitting: *Deleted

## 2021-08-25 ENCOUNTER — Encounter: Payer: Self-pay | Admitting: Cardiovascular Disease

## 2021-08-25 ENCOUNTER — Ambulatory Visit: Payer: Medicare HMO | Admitting: Cardiovascular Disease

## 2021-08-25 VITALS — BP 140/74 | HR 51 | Ht 70.0 in | Wt 179.0 lb

## 2021-08-25 DIAGNOSIS — I200 Unstable angina: Secondary | ICD-10-CM | POA: Diagnosis not present

## 2021-08-25 LAB — BASIC METABOLIC PANEL
BUN/Creatinine Ratio: 13 (ref 10–24)
BUN: 15 mg/dL (ref 8–27)
CO2: 23 mmol/L (ref 20–29)
Calcium: 9.8 mg/dL (ref 8.6–10.2)
Chloride: 102 mmol/L (ref 96–106)
Creatinine, Ser: 1.16 mg/dL (ref 0.76–1.27)
Glucose: 109 mg/dL — ABNORMAL HIGH (ref 70–99)
Potassium: 4.4 mmol/L (ref 3.5–5.2)
Sodium: 139 mmol/L (ref 134–144)
eGFR: 65 mL/min/{1.73_m2} (ref >59)

## 2021-08-25 NOTE — H&P (View-Only) (Signed)
Cardiology Office Note:    Date:  08/25/2021   ID:  Jared Roberts, DOB June 03, 1946, MRN [id number]  PCP:  Jared Pretty, MD  Cardiologist:  Jared Roberts  Electrophysiologist:  None   Referring MD: Jared Pretty, MD   Chief Complaint  Patient presents with   Coronary Artery Disease   Hypertension    January 31, 2019    Jared Roberts is a 76 y.o. male with a hx of  HTN, TIA and carotid stenosis .  Has been having some chest pain  A week ago, mowed the lawn,  Had CP and profound fatigue. Upper left chest ,  Left shoulder and arm,  Left jaw.   Comes and goes ,  Last for seconds - minutes ,   Resolves if he stops to rest.  Associated with dyspnea but also has dyspnea on its own.  Doesn't exercise,  Works in the yard . Can climb 2 flights of stairs - makes him breath hard.   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  Non smoker, Drinks 1 drink per night   March 26, 2019:  Jared Roberts is seen back today for a follow-up office visit.  He recently had a heart catheterization which revealed severe coronary artery disease and he had coronary artery bypass grafting. BP has been variable Lower in the am Higher in the PM Cannot tell when his BP is high or low Chest is sore but no angina  Exercising some - walking around the house .   Has a referral for cardiac rehab  May 08, 2019: Jared Roberts is seen back today for follow-up of his coronary artery disease.  He status post coronary artery bypass grafting.  He had early graft failure and had a PCI of his left circumflex artery. He presented several weeks later with angina  and was found restenosis of his LCx.   He had stenting of his LCX and is now seen for follow up    Still is not feeling well Is really tired.  He is 8 weeks out from his CABG  1 month out from graft closure and PCI 2 weeks out from 2nd PCI .   No angina .   Has chest wall pain   Brought Pressure log with him.  His evening blood pressures were running a  little high.  We recently increased his Benicar up to 20 mg twice a day.  Has back pain since his cardiac arrest on Sept. 14.  Was given flexeril which helps but makes him sleepy .  Has developed double vision starting a week ago.   Has Right cranial nerve IV palsy .    Has continued back pain   May 09, 2019: Was seen yesterday late morning Left the office, went to [employer] very poorly  Took a nap.  Woke up,  HR was 120  Took a SL NTG .   Measured BP which was low  Took metoprolol 50 mg ,   Eventually felt better.  Review of labs from yesterday suggest volume depletion Hb is normal ECG is normal sinus rhythm  today  Will give him propranolol to take as needed.   June 26, 2019:  Overall doing well Rare episodes of CP  Woke up with GERD 2 nights ago. Took Tums  GERD symptoms returned.  Took NTG and pains resolved. Quickly  Has a hx of heartburn ,  This resolved after the CABG.  September 24, 2019: Jared Roberts  is seen back for follow-up visit. Thinks he is losing ground  Is not able to walk slowly No angina Has twinges.   No cp .   Is fatigued.  Does not have the energy to walk much or walk quickly  CABG was Aug. 19.  tnen had MI on Sept 14 and sept 28   Still having back pain which radiates around to his stomach   We discussed his lipid levels.  His triglyceride level had gone from 187 up to 285.  His LDL cholesterol was 57.  It turns out he had had a large fried seafood platter the week before his blood draw and also ate pizza day or so before his blood draw.  It turns out that he is tried rosuvastatin in the past but developed severe aches with that.  We tried him on atorvastatin and he developed severe muscle aches with that.  He is gradually worked his way down to 10 mg which he seems to be tolerating fairly well but it is unclear that that is going to provide any benefit from a cardiovascular risk standpoint.  We will refer him to the lipid clinic for  consideration for PCSK9 inhibitors.  December 24, 2019:  Jared Roberts is seen today for follow-up of his coronary artery disease.  He has had coronary artery bypass grafting.  He then has had 2 subsequent PCI's.  Had a stomach ache for a month.   Does not know the etiology  No cp , no dyspnea  Exercising a little.   Going for back PT I reviewed his recent labs - LDL is 64. Trigs are slighlty elevated at 157.   Feb. 16, 2022: Jared Roberts is seen today for questions about his BP .   Seen with wife , Jared Roberts .  He has had some arrhythmias and measured a HR of 30s this past week.  Has been having more CP in the evening ,  Started Imdur 30 mg in the am which has seemed to help reduce his NTG usage.    Has started going to [facility] and has been swimming - without angina  Has had some wheezing initially , which has resolved.  Swims [phone] yards per session .  One evening ( 6-8 hours after swimming) he noticed palps .   " hard heart beats .  HR with the pulse oximeter showed HR D To be 30s   ( symptoms sound like PVCs )    recorded an ECG on his Kardia   Sounds like he may be volume depleted.   His HR stays high for several hours after swimming   January 21, 2021: Seen with wife, Jared Roberts is seen for follow up of his CAD, CABG,  Has been having some palpitations Event monitor showed occasional PVCs and PACs   Has been working on his deck.  Replacing with a composite wood Needs to rebuild the support beams  No angina  Working out at [employer] is 176 lbs   Has been doing some research on sodium levels and subsequent CHF / LVH.   Has relayed this info in Patient advice requests. .  Recent echo shows mild RV enlargement with moderate RV dysfunction  Has had a sleep study - did not show sleep apnea  No signs or symptoms of pulmonary embolus   Has not required a NTG  I had discussed with Dayna the possibility of repeating cath . Clinically he is not having any clinical issues  July 06, 2021: Seen with wife, Jared Roberts .  Jared Roberts is seen today for follow-up of his coronary artery disease.  He is exercising and working out on a regular basis.  Weight today is 177 pounds.  Has not been swimming ,  Still working on his deck . Has carotid bruits Is being followed by [facility]   We discussed holding his Imdur   Feb. 2, 2023; Jared Roberts is seen for follow up of his CAD  Was seen in the  [place] [facility] for CP on Jan. 18, 2023 Trops were negative Still not feeling well.   Has continued to have dizziness, weakness Has some angina like pains - similar to prior to his CABG .  He held his Imdur but this did not affect his CP or his dizziness.    His chest pain is a throbbing pain ,  might radiate to his left shoulder .  Belt like sensation   We discussed increasing Imdur or perhaps starting Ranexa 500 PO BID .  He would like to have a coronary CT angio instead of going straight to cath . Will send a message to Dr. Marisue Ivan to see if a Cor CTA would be interpretable .  If we cannot get the info from a CCA, will need to do a cardiac cath .     Past Medical History:  Diagnosis Date   Carotid artery disease ([place])    CKD (chronic kidney disease), stage III ([place])    Coronary artery disease    s/p CABG 02/2009 with  LIMA-LAD, SVG-OM, SVG-PDA, NSTEMI/VF arrest 03/2019 felt due to early graft failure s/p PTCA to distal Cx/OM2, then repeat PCI later 03/2019 with DES to [facility] and PTCA to OM branch   Cranial nerve VI palsy    DDD (degenerative disc disease), cervical    Depression    Dilated aortic root ([place])    ED (erectile dysfunction) of organic origin    Generalized osteoarthritis    GERD (gastroesophageal reflux disease)    Hiatal hernia    History of chronic bronchitis    History of gastritis    6269   History of Helicobacter pylori infection    2003   History of melanoma excision    2015--  right ear   History of prostate cancer urologist-  dr [name]   2007--  pT2a Nx Mx,  Gleason 3+3, S/P   PROSTATECTOMY   History of squamous cell carcinoma excision    2011  left knee   Hyperlipidemia    Hyperplastic colon polyp 2009   Hypertension    Male hypogonadism    Melanoma ([place])    left 4th toe   Nephrolithiasis    Prostate cancer ([place])    Sinus bradycardia    Ventricular fibrillation ([place]) 04/07/2019   VF, en route witnessed by EMS, defibrillated x 1    Past Surgical History:  Procedure Laterality Date   AMPUTATION TOE Left 09/07/2015   Procedure: PARTIAL AMPUTATION TOE 4TH LEFT;  Surgeon: [name], MD;  Location: [place];  Service: Podiatry;  Laterality: Left;   APPENDECTOMY  1970's   CORONARY ARTERY BYPASS GRAFT N/A 03/12/2019   Procedure: CORONARY ARTERY BYPASS GRAFTING (CABG) x 3 WITH ENDOSCOPIC HARVESTING OF RIGHT GREATER SAPHENOUS VEIN;  Surgeon: [name], MD;  Location: [place];  Service: Open Heart Surgery;  Laterality: N/A;   CORONARY BALLOON ANGIOPLASTY N/A 04/07/2019   Procedure: CORONARY BALLOON ANGIOPLASTY;  Surgeon: [name], MD;  Location: [facility]  INVASIVE CV LAB;  Service: Cardiovascular;  Laterality: N/A;   CORONARY STENT INTERVENTION N/A 04/21/2019   Procedure: CORONARY STENT INTERVENTION;  Surgeon: [name], MD;  Location: [place] CV LAB;  Service: Cardiovascular;  Laterality: N/A;   CYSTO/  CLOT EVACUATION POST PROSTATECTOMY  11-19-2005   CYSTO/  REMOVAL URETHRAL STONE AND URETHRAL STAPLES  06-02-2010   CYSTO/  URETEROSCOPIC STONE EXTRACTION  X2  1990's   KIDNEY STONE SURGERY     LEFT HEART CATH AND CORONARY ANGIOGRAPHY N/A 02/25/2019   Procedure: LEFT HEART CATH AND CORONARY ANGIOGRAPHY;  Surgeon: [name], MD;  Location: [place] CV LAB;  Service: Cardiovascular;  Laterality: N/A;   LEFT HEART CATH AND CORS/GRAFTS ANGIOGRAPHY N/A 04/07/2019   Procedure: LEFT HEART CATH AND CORS/GRAFTS ANGIOGRAPHY;  Surgeon: [name], MD;  Location: [place] CV LAB;  Service: Cardiovascular;  Laterality:  N/A;   LEFT HEART CATH AND CORS/GRAFTS ANGIOGRAPHY N/A 04/21/2019   Procedure: LEFT HEART CATH AND CORS/GRAFTS ANGIOGRAPHY;  Surgeon: [name], MD;  Location: [place] CV LAB;  Service: Cardiovascular;  Laterality: N/A;   MELANOMA EXCISION  2015   right ear   ROBOT ASSISTED LAPAROSCOPIC RADICAL PROSTATECTOMY  11-09-2005   SKIN CANCER EXCISION     TEE WITHOUT CARDIOVERSION N/A 03/12/2019   Procedure: TRANSESOPHAGEAL ECHOCARDIOGRAM (TEE);  Surgeon: [name], MD;  Location: [place];  Service: Open Heart Surgery;  Laterality: N/A;   TONSILLECTOMY  child    Current Medications: Current Meds  Medication Sig   ALPHA LIPOIC ACID PO Take 1 tablet by mouth daily.   Ascorbic Acid (VITAMIN C PO) Take 1 tablet by mouth daily.   atorvastatin (LIPITOR) 20 MG tablet TAKE ONE TABLET BY MOUTH DAILY   calcium carbonate (TUMS - DOSED IN MG ELEMENTAL CALCIUM) 500 MG chewable tablet Chew 2 tablets by mouth as needed for indigestion or heartburn.    Cholecalciferol (VITAMIN D) 2000 units CAPS Take 2,000 Units by mouth 2 (two) times a day.    CHROMIUM PO Take by mouth daily.   clopidogrel (PLAVIX) 75 MG tablet Take 1 tablet (75 mg total) by mouth daily.   COVID-19 mRNA bivalent vaccine, Pfizer, (PFIZER COVID-19 VAC BIVALENT) injection Inject into the muscle.   COVID-19 mRNA vaccine, Pfizer, 30 MCG/0.3ML injection Inject into the muscle.   famotidine (PEPCID) 40 MG tablet Take 40 mg by mouth daily. Per patient taking 1/2 tablet of the 40 mg tabs.   Fish Oil-Cholecalciferol (FISH OIL + D3 PO) Take 1 capsule by mouth daily.   hydrochlorothiazide (HYDRODIURIL) 25 MG tablet TAKE ONE TABLET BY MOUTH DAILY   ibuprofen (ADVIL) 400 MG tablet Take 400 mg by mouth as needed for moderate pain.   influenza vaccine adjuvanted (FLUAD QUADRIVALENT) 0.5 ML injection Inject into the muscle.   isosorbide mononitrate (IMDUR) 30 MG 24 hr tablet Take 1 tablet (30 mg total) by mouth daily.   losartan  (COZAAR) 100 MG tablet TAKE ONE TABLET BY MOUTH DAILY   MAGNESIUM PO Take 300 mg by mouth daily.   metoprolol tartrate (LOPRESSOR) 50 MG tablet Take 1 tablet (50 mg total) by mouth 2 (two) times daily.   Multiple Vitamin (MULTIVITAMIN WITH MINERALS) TABS tablet Take 1 tablet by mouth daily.   nitroGLYCERIN (NITROSTAT) 0.4 MG SL tablet DISSOLVE 1 TAB UNDER TONGUE FOR CHEST PAIN - IF PAIN REMAINS AFTER 5 MIN, CALL 911 AND REPEAT DOSE. MAX 3 TABS IN 15 MINUTES   potassium chloride (KLOR-CON) 10  MEQ tablet TAKE ONE TABLET BY MOUTH DAILY   Probiotic Product (PROBIOTIC DAILY PO) Take 1 capsule by mouth daily.      Allergies:   Patient has no known allergies.   Social History   Socioeconomic History   Marital status: Married    Spouse name: Jared Roberts   Number of children: 1   Years of education: 18   Highest education level: Master's degree (e.g., MA, MS, MEng, MEd, MSW, MBA)  Occupational History   Occupation: retired    Comment: from Manufacturing engineer  Tobacco Use   Smoking status: Never   Smokeless tobacco: Never  Vaping Use   Vaping Use: Never used  Substance and Sexual Activity   Alcohol use: Yes    Comment: 1 glass of wine daily   Drug use: No   Sexual activity: Not on file    Comment: retired, married. 1 son  Other Topics Concern   Not on file  Social History Narrative   Not on file   Social Determinants of Health   Financial Resource Strain: Not on file  Food Insecurity: Not on file  Transportation Needs: Not on file  Physical Activity: Not on file  Stress: Not on file  Social Connections: Not on file     Family History: The patient's family history includes Arthritis in his mother; Congestive Heart Failure in his mother; Diabetes Mellitus II in his mother; Osteoarthritis in his mother; Other in his father.  ROS:   Please see the history of present illness.     All other systems reviewed and are negative.  EKGs/Labs/Other Studies Reviewed:    The following studies  were reviewed today:    Recent Labs: 10/29/2020: Magnesium 2.0; TSH 2.510 08/10/2021: ALT 19; BUN 20; Creatinine, Ser 1.39; Hemoglobin 17.8; Platelets 244; Potassium 4.5; Sodium 135  Recent Lipid Panel    Component Value Date/Time   CHOL 157 12/15/2019 0941   TRIG 188 (H) 12/15/2019 0941   HDL 62 12/15/2019 0941   CHOLHDL 2.5 12/15/2019 0941   CHOLHDL 3.4 04/08/2019 0300   VLDL 37 04/08/2019 0300   LDLCALC 64 12/15/2019 0941    Physical Exam:     Physical Exam: Blood pressure 140/74, pulse (!) 51, height 5\' 10"  (1.778 m), weight 179 lb (81.2 kg), SpO2 97 %.  GEN:  Well nourished, well developed in no acute distress HEENT: Normal NECK: No JVD; No carotid bruits LYMPHATICS: No lymphadenopathy CARDIAC: RRR , no murmurs, rubs, gallops RESPIRATORY:  Clear to auscultation without rales, wheezing or rhonchi  ABDOMEN: Soft, non-tender, non-distended MUSCULOSKELETAL:  No edema; No deformity  SKIN: Warm and dry NEUROLOGIC:  Alert and oriented x 3    EKG:      ASSESSMENT:    1. Unstable angina pectoris [facility])       PLAN:      CAD :        Jared Roberts is having recurrent angina .   Just a dull ache / pressure .   He will need some additional imaging.  We discussed the possibility of doing a coronary CT angiogram versus diagnostic heart catheterization.  He would rather do a coronary CT angiogram if it would show [place] the anatomy accurately.  I have written a note to Dr. Grayling Congress to see if that might be feasible.  Jared Roberts has an LIMA to the LAD.  The saphenous vein graft to the right coronary artery is patent.  The saphenous vein graft to the OM 2 is occluded.  Dr. Burt Knack attempted PTCA of his native OM  in Sept, 2020- the vessel was too small for stenting   We discussed increasing Imdur or perhaps adding Ranexa.  Will wait until after the CCA or cath to decide    Cont ASA and plavix for now    2.  Palpitations:      2.  HTN:         3.  Hyperlipidemia:    cont current meds.        Medication Adjustments/Labs and Tests Ordered: Current medicines are reviewed at length with the patient today.  Concerns regarding medicines are outlined above.  Orders Placed This Encounter  Procedures   CT CORONARY MORPH W/CTA COR W/SCORE W/CA W/CM &/OR WO/CM   Basic metabolic panel    No orders of the defined types were placed in this encounter.     Patient Instructions  Medication Instructions:  Your physician recommends that you continue on your current medications as directed. Please refer to the Current Medication list given to you today.  *If you need a refill on your cardiac medications before your next appointment, please call your pharmacy*   Lab Work: TODAY:  BMET  If you have labs (blood work) drawn today and your tests are completely normal, you will receive your results only by: [place] (if you have MyChart) OR A paper copy in the mail If you have any lab test that is abnormal or we need to change your treatment, we will call you to review the results.   Testing/Procedures: Your physician has requested that you have cardiac CT. Cardiac computed tomography (CT) is a painless test that uses an x-ray machine to take clear, detailed pictures of your heart. For further information please visit [email or web address]. Please follow instruction sheet as BELOW:     Your cardiac CT will be scheduled at one of the below locations:   [facility] [address] [place], [place] [address] [phone]  Please arrive at the [facility] main entrance (entrance A) of [facility] 30 minutes prior to test start time. You can use the FREE valet parking offered at the main entrance (encouraged to control the heart rate for the test) Proceed to the [facility] Radiology Department (first floor) to check-in and test prep.   Please follow these instructions carefully (unless otherwise directed):  Hold all erectile dysfunction medications  at least 3 days (72 hrs) prior to test.  On the Night Before the Test: Be sure to Drink plenty of water. Do not consume any caffeinated/decaffeinated beverages or chocolate 12 hours prior to your test. Do not take any antihistamines 12 hours prior to your test.   On the Day of the Test: Drink plenty of water until 1 hour prior to the test. Do not eat any food 4 hours prior to the test. You may take your regular medications prior to the test.  Take metoprolol (Lopressor) two hours prior to test. HOLD Hydrochlorothiazide morning of the test.       After the Test: Drink plenty of water. After receiving IV contrast, you may experience a mild flushed feeling. This is normal. On occasion, you may experience a mild rash up to 24 hours after the test. This is not dangerous. If this occurs, you can take Benadryl 25 mg and increase your fluid intake. If you experience trouble breathing, this can be serious. If it is severe call 911 IMMEDIATELY. If it is mild, please call our office.  We will call to schedule your test 2-4 weeks out understanding that some insurance companies will need an authorization prior to the service being performed.   For non-scheduling related questions, please contact the cardiac imaging nurse navigator should you have any questions/concerns: Marchia Bond, Cardiac Imaging Nurse Navigator Gordy Clement, Cardiac Imaging Nurse Navigator [facility] Direct Office Dial: [phone]   For scheduling needs, including cancellations and rescheduling, please call [place], [phone]. .     Follow-Up: At [facility], you and your health needs are our priority.  As part of our continuing mission to provide you with exceptional heart care, we have created designated Provider Care Teams.  These Care Teams include your primary Cardiologist (physician) and Advanced Practice Providers (APPs -  Physician Assistants and Nurse Practitioners) who  all work together to provide you with the care you need, when you need it.  We recommend signing up for the patient portal called "MyChart".  Sign up information is provided on this After Visit Summary.  MyChart is used to connect with patients for Virtual Visits (Telemedicine).  Patients are able to view lab/test results, encounter notes, upcoming appointments, etc.  Non-urgent messages can be sent to your provider as well.   To learn more about what you can do with MyChart, go to [email or web address].    Your next appointment:   2-3 month(s)  The format for your next appointment:   In Person  Provider:   Mertie Moores, MD     Other Instructions     Signed, Mertie Moores, MD  08/25/2021 12:23 PM    [place]

## 2021-08-25 NOTE — Patient Instructions (Addendum)
Medication Instructions:  Your physician recommends that you continue on your current medications as directed. Please refer to the Current Medication list given to you today.  *If you need a refill on your cardiac medications before your next appointment, please call your pharmacy*   Lab Work: TODAY:  BMET  If you have labs (blood work) drawn today and your tests are completely normal, you will receive your results only by: [place] (if you have MyChart) OR A paper copy in the mail If you have any lab test that is abnormal or we need to change your treatment, we will call you to review the results.   Testing/Procedures: Your physician has requested that you have a cardiac catheterization. Cardiac catheterization is used to diagnose and/or treat various heart conditions. Doctors may recommend this procedure for a number of different reasons. The most common reason is to evaluate chest pain. Chest pain can be a symptom of coronary artery disease (CAD), and cardiac catheterization can show whether plaque is narrowing or blocking your hearts arteries. This procedure is also used to evaluate the valves, as well as measure the blood flow and oxygen levels in different parts of your heart. For further information please visit [email or web address]. Please follow instruction sheet, BELOW:   [facility] OFFICE [place], [place] [address] Dept: [phone] Loc: [place]  08/25/2021  You are scheduled for a Cardiac Catheterization on Wednesday, February 8 with Dr. Lenna Sciara.  1. Please arrive at the [facility] (Main Entrance A) at [facility]: [address] [place], [place] [address] at 6:30 AM (This time is two hours before your procedure to ensure your preparation). Free valet parking service is available.   Special note: Every effort is made to have your  procedure done on time. Please understand that emergencies sometimes delay scheduled procedures.  2. Diet: Do not eat solid foods after midnight.  The patient may have clear liquids until 5am upon the day of the procedure.  3. Labs: You will need to have blood drawn on TODAY   4. Medication instructions in preparation for your procedure:   Contrast Allergy: No  Stop taking, HTCZ (Hydrochlorothiazide)     On the morning of your procedure, take your Aspirin and Plavix/Clopidogrel and any morning medicines NOT listed above.  You may use sips of water.  5. Plan for one night stay--bring personal belongings. 6. Bring a current list of your medications and current insurance cards. 7. You MUST have a responsible person to drive you home. 8. Someone MUST be with you the first 24 hours after you arrive home or your discharge will be delayed. 9. Please wear clothes that are easy to get on and off and wear slip-on shoes.  Thank you for allowing [place] to care for you!   -- [facility]      Follow-Up: At [facility], you and your health needs are our priority.  As part of our continuing mission to provide you with exceptional heart care, we have created designated Provider Care Teams.  These Care Teams include your primary Cardiologist (physician) and Advanced Practice Providers (APPs -  Physician Assistants and Nurse Practitioners) who all work together to provide you with the care you need, when you need it.  We recommend signing up for the patient portal called "MyChart".  Sign up information is provided on this After Visit Summary.  MyChart is used to connect with patients for Virtual Visits (Telemedicine).  Patients are able to view lab/test results, encounter notes, upcoming appointments, etc.  Non-urgent messages can be sent to your provider as well.   To learn more about what you can do with MyChart, go to [email or web address].    Your next  appointment:   2-3 month(s)  The format for your next appointment:   In Person  Provider:   Mertie Moores, MD     Other Instructions

## 2021-08-25 NOTE — Progress Notes (Signed)
Cardiology Office Note:    Date:  08/25/2021   ID:  Jared Roberts, DOB 01-13-1946, MRN [id number]  PCP:  Deland Pretty, MD  Cardiologist:  Jandy Brackens  Electrophysiologist:  None   Referring MD: Deland Pretty, MD   Chief Complaint  Patient presents with   Coronary Artery Disease   Hypertension    January 31, 2019    Jared Roberts is a 76 y.o. male with a hx of  HTN, TIA and carotid stenosis .  Has been having some chest pain  A week ago, mowed the lawn,  Had CP and profound fatigue. Upper left chest ,  Left shoulder and arm,  Left jaw.   Comes and goes ,  Last for seconds - minutes ,   Resolves if he stops to rest.  Associated with dyspnea but also has dyspnea on its own.  Doesn't exercise,  Works in the yard . Can climb 2 flights of stairs - makes him breath hard.   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  Non smoker, Drinks 1 drink per night   March 26, 2019:  Jared Roberts is seen back today for a follow-up office visit.  He recently had a heart catheterization which revealed severe coronary artery disease and he had coronary artery bypass grafting. BP has been variable Lower in the am Higher in the PM Cannot tell when his BP is high or low Chest is sore but no angina  Exercising some - walking around the house .   Has a referral for cardiac rehab  May 08, 2019: Jared Roberts is seen back today for follow-up of his coronary artery disease.  He status post coronary artery bypass grafting.  He had early graft failure and had a PCI of his left circumflex artery. He presented several weeks later with angina  and was found restenosis of his LCx.   He had stenting of his LCX and is now seen for follow up    Still is not feeling well Is really tired.  He is 8 weeks out from his CABG  1 month out from graft closure and PCI 2 weeks out from 2nd PCI .   No angina .   Has chest wall pain   Brought Pressure log with him.  His evening blood pressures were running a  little high.  We recently increased his Benicar up to 20 mg twice a day.  Has back pain since his cardiac arrest on Sept. 14.  Was given flexeril which helps but makes him sleepy .  Has developed double vision starting a week ago.   Has Right cranial nerve IV palsy .    Has continued back pain   May 09, 2019: Was seen yesterday late morning Left the office, went to [employer] very poorly  Took a nap.  Woke up,  HR was 120  Took a SL NTG .   Measured BP which was low  Took metoprolol 50 mg ,   Eventually felt better.  Review of labs from yesterday suggest volume depletion Hb is normal ECG is normal sinus rhythm  today  Will give him propranolol to take as needed.   June 26, 2019:  Overall doing well Rare episodes of CP  Woke up with GERD 2 nights ago. Took Tums  GERD symptoms returned.  Took NTG and pains resolved. Quickly  Has a hx of heartburn ,  This resolved after the CABG.  September 24, 2019: Jared Roberts  is seen back for follow-up visit. Thinks he is losing ground  Is not able to walk slowly No angina Has twinges.   No cp .   Is fatigued.  Does not have the energy to walk much or walk quickly  CABG was Aug. 19.  tnen had MI on Sept 14 and sept 28   Still having back pain which radiates around to his stomach   We discussed his lipid levels.  His triglyceride level had gone from 187 up to 285.  His LDL cholesterol was 57.  It turns out he had had a large fried seafood platter the week before his blood draw and also ate pizza day or so before his blood draw.  It turns out that he is tried rosuvastatin in the past but developed severe aches with that.  We tried him on atorvastatin and he developed severe muscle aches with that.  He is gradually worked his way down to 10 mg which he seems to be tolerating fairly well but it is unclear that that is going to provide any benefit from a cardiovascular risk standpoint.  We will refer him to the lipid clinic for  consideration for PCSK9 inhibitors.  December 24, 2019:  Jared Roberts is seen today for follow-up of his coronary artery disease.  He has had coronary artery bypass grafting.  He then has had 2 subsequent PCI's.  Had a stomach ache for a month.   Does not know the etiology  No cp , no dyspnea  Exercising a little.   Going for back PT I reviewed his recent labs - LDL is 64. Trigs are slighlty elevated at 157.   Feb. 16, 2022: Jared Roberts is seen today for questions about his BP .   Seen with wife , [name] .  He has had some arrhythmias and measured a HR of 30s this past week.  Has been having more CP in the evening ,  Started Imdur 30 mg in the am which has seemed to help reduce his NTG usage.    Has started going to [facility] and has been swimming - without angina  Has had some wheezing initially , which has resolved.  Swims [phone] yards per session .  One evening ( 6-8 hours after swimming) he noticed palps .   " hard heart beats .  HR with the pulse oximeter showed HR D To be 30s   ( symptoms sound like PVCs )    recorded an ECG on his Kardia   Sounds like he may be volume depleted.   His HR stays high for several hours after swimming   January 21, 2021: Seen with wife, Brand Males is seen for follow up of his CAD, CABG,  Has been having some palpitations Event monitor showed occasional PVCs and PACs   Has been working on his deck.  Replacing with a composite wood Needs to rebuild the support beams  No angina  Working out at [employer] is 176 lbs   Has been doing some research on sodium levels and subsequent CHF / LVH.   Has relayed this info in Patient advice requests. .  Recent echo shows mild RV enlargement with moderate RV dysfunction  Has had a sleep study - did not show sleep apnea  No signs or symptoms of pulmonary embolus   Has not required a NTG  I had discussed with Dayna the possibility of repeating cath . Clinically he is not having any clinical issues  July 06, 2021: Seen with wife, [name] .  Jared Roberts is seen today for follow-up of his coronary artery disease.  He is exercising and working out on a regular basis.  Weight today is 177 pounds.  Has not been swimming ,  Still working on his deck . Has carotid bruits Is being followed by [facility]   We discussed holding his Imdur   Feb. 2, 2023; Jared Roberts is seen for follow up of his CAD  Was seen in the  [place] ER for CP on Jan. 18, 2023 Trops were negative Still not feeling well.   Has continued to have dizziness, weakness Has some angina like pains - similar to prior to his CABG .  He held his Imdur but this did not affect his CP or his dizziness.    His chest pain is a throbbing pain ,  might radiate to his left shoulder .  Belt like sensation   We discussed increasing Imdur or perhaps starting Ranexa 500 PO BID .  He would like to have a coronary CT angio instead of going straight to cath . Will send a message to Dr. Marisue Ivan to see if a Cor CTA would be interpretable .  If we cannot get the info from a CCA, will need to do a cardiac cath .     Past Medical History:  Diagnosis Date   Carotid artery disease ([place])    CKD (chronic kidney disease), stage III ([place])    Coronary artery disease    s/p CABG 02/2009 with  LIMA-LAD, SVG-OM, SVG-PDA, NSTEMI/VF arrest 03/2019 felt due to early graft failure s/p PTCA to distal Cx/OM2, then repeat PCI later 03/2019 with DES to [facility] and PTCA to OM branch   Cranial nerve VI palsy    DDD (degenerative disc disease), cervical    Depression    Dilated aortic root ([place])    ED (erectile dysfunction) of organic origin    Generalized osteoarthritis    GERD (gastroesophageal reflux disease)    Hiatal hernia    History of chronic bronchitis    History of gastritis    2376   History of Helicobacter pylori infection    2003   History of melanoma excision    2015--  right ear   History of prostate cancer urologist-  dr [name]   2007--  pT2a Nx Mx,  Gleason 3+3, S/P   PROSTATECTOMY   History of squamous cell carcinoma excision    2011  left knee   Hyperlipidemia    Hyperplastic colon polyp 2009   Hypertension    Male hypogonadism    Melanoma ([place])    left 4th toe   Nephrolithiasis    Prostate cancer ([place])    Sinus bradycardia    Ventricular fibrillation ([place]) 04/07/2019   VF, en route witnessed by EMS, defibrillated x 1    Past Surgical History:  Procedure Laterality Date   AMPUTATION TOE Left 09/07/2015   Procedure: PARTIAL AMPUTATION TOE 4TH LEFT;  Surgeon: [name], MD;  Location: [place];  Service: Podiatry;  Laterality: Left;   APPENDECTOMY  1970's   CORONARY ARTERY BYPASS GRAFT N/A 03/12/2019   Procedure: CORONARY ARTERY BYPASS GRAFTING (CABG) x 3 WITH ENDOSCOPIC HARVESTING OF RIGHT GREATER SAPHENOUS VEIN;  Surgeon: [name], MD;  Location: [place];  Service: Open Heart Surgery;  Laterality: N/A;   CORONARY BALLOON ANGIOPLASTY N/A 04/07/2019   Procedure: CORONARY BALLOON ANGIOPLASTY;  Surgeon: [name], MD;  Location: [facility]  INVASIVE CV LAB;  Service: Cardiovascular;  Laterality: N/A;   CORONARY STENT INTERVENTION N/A 04/21/2019   Procedure: CORONARY STENT INTERVENTION;  Surgeon: [name], MD;  Location: [place] CV LAB;  Service: Cardiovascular;  Laterality: N/A;   CYSTO/  CLOT EVACUATION POST PROSTATECTOMY  11-19-2005   CYSTO/  REMOVAL URETHRAL STONE AND URETHRAL STAPLES  06-02-2010   CYSTO/  URETEROSCOPIC STONE EXTRACTION  X2  1990's   KIDNEY STONE SURGERY     LEFT HEART CATH AND CORONARY ANGIOGRAPHY N/A 02/25/2019   Procedure: LEFT HEART CATH AND CORONARY ANGIOGRAPHY;  Surgeon: [name], MD;  Location: [place] CV LAB;  Service: Cardiovascular;  Laterality: N/A;   LEFT HEART CATH AND CORS/GRAFTS ANGIOGRAPHY N/A 04/07/2019   Procedure: LEFT HEART CATH AND CORS/GRAFTS ANGIOGRAPHY;  Surgeon: [name], MD;  Location: [place] CV LAB;  Service: Cardiovascular;  Laterality:  N/A;   LEFT HEART CATH AND CORS/GRAFTS ANGIOGRAPHY N/A 04/21/2019   Procedure: LEFT HEART CATH AND CORS/GRAFTS ANGIOGRAPHY;  Surgeon: [name], MD;  Location: [place] CV LAB;  Service: Cardiovascular;  Laterality: N/A;   MELANOMA EXCISION  2015   right ear   ROBOT ASSISTED LAPAROSCOPIC RADICAL PROSTATECTOMY  11-09-2005   SKIN CANCER EXCISION     TEE WITHOUT CARDIOVERSION N/A 03/12/2019   Procedure: TRANSESOPHAGEAL ECHOCARDIOGRAM (TEE);  Surgeon: [name], MD;  Location: [place];  Service: Open Heart Surgery;  Laterality: N/A;   TONSILLECTOMY  child    Current Medications: Current Meds  Medication Sig   ALPHA LIPOIC ACID PO Take 1 tablet by mouth daily.   Ascorbic Acid (VITAMIN C PO) Take 1 tablet by mouth daily.   atorvastatin (LIPITOR) 20 MG tablet TAKE ONE TABLET BY MOUTH DAILY   calcium carbonate (TUMS - DOSED IN MG ELEMENTAL CALCIUM) 500 MG chewable tablet Chew 2 tablets by mouth as needed for indigestion or heartburn.    Cholecalciferol (VITAMIN D) 2000 units CAPS Take 2,000 Units by mouth 2 (two) times a day.    CHROMIUM PO Take by mouth daily.   clopidogrel (PLAVIX) 75 MG tablet Take 1 tablet (75 mg total) by mouth daily.   COVID-19 mRNA bivalent vaccine, Pfizer, (PFIZER COVID-19 VAC BIVALENT) injection Inject into the muscle.   COVID-19 mRNA vaccine, Pfizer, 30 MCG/0.3ML injection Inject into the muscle.   famotidine (PEPCID) 40 MG tablet Take 40 mg by mouth daily. Per patient taking 1/2 tablet of the 40 mg tabs.   Fish Oil-Cholecalciferol (FISH OIL + D3 PO) Take 1 capsule by mouth daily.   hydrochlorothiazide (HYDRODIURIL) 25 MG tablet TAKE ONE TABLET BY MOUTH DAILY   ibuprofen (ADVIL) 400 MG tablet Take 400 mg by mouth as needed for moderate pain.   influenza vaccine adjuvanted (FLUAD QUADRIVALENT) 0.5 ML injection Inject into the muscle.   isosorbide mononitrate (IMDUR) 30 MG 24 hr tablet Take 1 tablet (30 mg total) by mouth daily.   losartan  (COZAAR) 100 MG tablet TAKE ONE TABLET BY MOUTH DAILY   MAGNESIUM PO Take 300 mg by mouth daily.   metoprolol tartrate (LOPRESSOR) 50 MG tablet Take 1 tablet (50 mg total) by mouth 2 (two) times daily.   Multiple Vitamin (MULTIVITAMIN WITH MINERALS) TABS tablet Take 1 tablet by mouth daily.   nitroGLYCERIN (NITROSTAT) 0.4 MG SL tablet DISSOLVE 1 TAB UNDER TONGUE FOR CHEST PAIN - IF PAIN REMAINS AFTER 5 MIN, CALL 911 AND REPEAT DOSE. MAX 3 TABS IN 15 MINUTES   potassium chloride (KLOR-CON) 10  MEQ tablet TAKE ONE TABLET BY MOUTH DAILY   Probiotic Product (PROBIOTIC DAILY PO) Take 1 capsule by mouth daily.      Allergies:   Patient has no known allergies.   Social History   Socioeconomic History   Marital status: Married    Spouse name: [name]   Number of children: 1   Years of education: 18   Highest education level: Master's degree (e.g., MA, MS, MEng, MEd, MSW, MBA)  Occupational History   Occupation: retired    Comment: from Manufacturing engineer  Tobacco Use   Smoking status: Never   Smokeless tobacco: Never  Vaping Use   Vaping Use: Never used  Substance and Sexual Activity   Alcohol use: Yes    Comment: 1 glass of wine daily   Drug use: No   Sexual activity: Not on file    Comment: retired, married. 1 son  Other Topics Concern   Not on file  Social History Narrative   Not on file   Social Determinants of Health   Financial Resource Strain: Not on file  Food Insecurity: Not on file  Transportation Needs: Not on file  Physical Activity: Not on file  Stress: Not on file  Social Connections: Not on file     Family History: The patient's family history includes Arthritis in his mother; Congestive Heart Failure in his mother; Diabetes Mellitus II in his mother; Osteoarthritis in his mother; Other in his father.  ROS:   Please see the history of present illness.     All other systems reviewed and are negative.  EKGs/Labs/Other Studies Reviewed:    The following studies  were reviewed today:    Recent Labs: 10/29/2020: Magnesium 2.0; TSH 2.510 08/10/2021: ALT 19; BUN 20; Creatinine, Ser 1.39; Hemoglobin 17.8; Platelets 244; Potassium 4.5; Sodium 135  Recent Lipid Panel    Component Value Date/Time   CHOL 157 12/15/2019 0941   TRIG 188 (H) 12/15/2019 0941   HDL 62 12/15/2019 0941   CHOLHDL 2.5 12/15/2019 0941   CHOLHDL 3.4 04/08/2019 0300   VLDL 37 04/08/2019 0300   LDLCALC 64 12/15/2019 0941    Physical Exam:     Physical Exam: Blood pressure 140/74, pulse (!) 51, height 5\' 10"  (1.778 m), weight 179 lb (81.2 kg), SpO2 97 %.  GEN:  Well nourished, well developed in no acute distress HEENT: Normal NECK: No JVD; No carotid bruits LYMPHATICS: No lymphadenopathy CARDIAC: RRR , no murmurs, rubs, gallops RESPIRATORY:  Clear to auscultation without rales, wheezing or rhonchi  ABDOMEN: Soft, non-tender, non-distended MUSCULOSKELETAL:  No edema; No deformity  SKIN: Warm and dry NEUROLOGIC:  Alert and oriented x 3    EKG:      ASSESSMENT:    1. Unstable angina pectoris [facility])       PLAN:      CAD :        Jared Roberts is having recurrent angina .   Just a dull ache / pressure .   He will need some additional imaging.  We discussed the possibility of doing a coronary CT angiogram versus diagnostic heart catheterization.  He would rather do a coronary CT angiogram if it would show [place] the anatomy accurately.  I have written a note to Dr. Grayling Congress to see if that might be feasible.  Jared Roberts has an LIMA to the LAD.  The saphenous vein graft to the right coronary artery is patent.  The saphenous vein graft to the OM 2 is occluded.  Dr. Burt Knack attempted PTCA of his native OM  in Sept, 2020- the vessel was too small for stenting   We discussed increasing Imdur or perhaps adding Ranexa.  Will wait until after the CCA or cath to decide    Cont ASA and plavix for now    2.  Palpitations:      2.  HTN:         3.  Hyperlipidemia:    cont current meds.        Medication Adjustments/Labs and Tests Ordered: Current medicines are reviewed at length with the patient today.  Concerns regarding medicines are outlined above.  Orders Placed This Encounter  Procedures   CT CORONARY MORPH W/CTA COR W/SCORE W/CA W/CM &/OR WO/CM   Basic metabolic panel    No orders of the defined types were placed in this encounter.     Patient Instructions  Medication Instructions:  Your physician recommends that you continue on your current medications as directed. Please refer to the Current Medication list given to you today.  *If you need a refill on your cardiac medications before your next appointment, please call your pharmacy*   Lab Work: TODAY:  BMET  If you have labs (blood work) drawn today and your tests are completely normal, you will receive your results only by: [place] (if you have MyChart) OR A paper copy in the mail If you have any lab test that is abnormal or we need to change your treatment, we will call you to review the results.   Testing/Procedures: Your physician has requested that you have cardiac CT. Cardiac computed tomography (CT) is a painless test that uses an x-ray machine to take clear, detailed pictures of your heart. For further information please visit [email or web address]. Please follow instruction sheet as BELOW:     Your cardiac CT will be scheduled at one of the below locations:   [facility] [address] [place], [place] [address] [phone]  Please arrive at the [facility] main entrance (entrance A) of [facility] 30 minutes prior to test start time. You can use the FREE valet parking offered at the main entrance (encouraged to control the heart rate for the test) Proceed to the [facility] Department (first floor) to check-in and test prep.   Please follow these instructions carefully (unless otherwise directed):  Hold all erectile dysfunction medications  at least 3 days (72 hrs) prior to test.  On the Night Before the Test: Be sure to Drink plenty of water. Do not consume any caffeinated/decaffeinated beverages or chocolate 12 hours prior to your test. Do not take any antihistamines 12 hours prior to your test.   On the Day of the Test: Drink plenty of water until 1 hour prior to the test. Do not eat any food 4 hours prior to the test. You may take your regular medications prior to the test.  Take metoprolol (Lopressor) two hours prior to test. HOLD Hydrochlorothiazide morning of the test.       After the Test: Drink plenty of water. After receiving IV contrast, you may experience a mild flushed feeling. This is normal. On occasion, you may experience a mild rash up to 24 hours after the test. This is not dangerous. If this occurs, you can take Benadryl 25 mg and increase your fluid intake. If you experience trouble breathing, this can be serious. If it is severe call 911 IMMEDIATELY. If it is mild, please call our office.  We will call to schedule your test 2-4 weeks out understanding that some insurance companies will need an authorization prior to the service being performed.   For non-scheduling related questions, please contact the cardiac imaging nurse navigator should you have any questions/concerns: Marchia Bond, Cardiac Imaging Nurse Navigator Gordy Clement, Cardiac Imaging Nurse Navigator [facility] Direct Office Dial: [phone]   For scheduling needs, including cancellations and rescheduling, please call [place], [phone]. .     Follow-Up: At [facility], you and your health needs are our priority.  As part of our continuing mission to provide you with exceptional heart care, we have created designated Provider Care Teams.  These Care Teams include your primary Cardiologist (physician) and Advanced Practice Providers (APPs -  Physician Assistants and Nurse Practitioners) who  all work together to provide you with the care you need, when you need it.  We recommend signing up for the patient portal called "MyChart".  Sign up information is provided on this After Visit Summary.  MyChart is used to connect with patients for Virtual Visits (Telemedicine).  Patients are able to view lab/test results, encounter notes, upcoming appointments, etc.  Non-urgent messages can be sent to your provider as well.   To learn more about what you can do with MyChart, go to [email or web address].    Your next appointment:   2-3 month(s)  The format for your next appointment:   In Person  Provider:   Mertie Moores, MD     Other Instructions     Signed, Mertie Moores, MD  08/25/2021 12:23 PM    [place]

## 2021-08-25 NOTE — Addendum Note (Signed)
Addended by: Gaetano Net on: 08/25/2021 01:33 PM   Modules accepted: Orders

## 2021-08-29 ENCOUNTER — Other Ambulatory Visit: Payer: Medicare HMO

## 2021-08-29 ENCOUNTER — Other Ambulatory Visit: Payer: Self-pay

## 2021-08-29 DIAGNOSIS — I200 Unstable angina: Secondary | ICD-10-CM | POA: Diagnosis not present

## 2021-08-29 LAB — CBC
Hematocrit: 45.3 % (ref 37.5–51.0)
Hemoglobin: 15.9 g/dL (ref 13.0–17.7)
MCH: 31.7 pg (ref 26.6–33.0)
MCHC: 35.1 g/dL (ref 31.5–35.7)
MCV: 90 fL (ref 79–97)
Platelets: 189 10*3/uL (ref 150–450)
RBC: 5.02 x10E6/uL (ref 4.14–5.80)
RDW: 13.6 % (ref 11.6–15.4)
WBC: 9.9 10*3/uL (ref 3.4–10.8)

## 2021-08-30 ENCOUNTER — Telehealth: Payer: Self-pay | Admitting: *Deleted

## 2021-08-30 NOTE — Telephone Encounter (Signed)
Cardiac catheterization scheduled at [facility] for: Wednesday August 31, 2021 8:30 AM [facility] Main Entrance A [facility]) at: 6:30 AM   Diet-no solid food after midnight prior to cath, clear liquids until 5 AM day of procedure.  Medication instructions for procedure: -Hold:   HCTZ-AM of procedure -Except hold medications usual morning medications can be taken pre-cath with sips of water including aspirin 81 mg and Plavix 75 mg.    Must have responsible adult to drive home post procedure and be with patient first 24 hours after arriving home.  [facility] does allow one visitor to accompany you and wait in the hospital waiting room while you are there for your procedure. You and your visitor will be asked to wear a mask once you enter the hospital.   Patient reports does not currently have any new symptoms concerning for COVID-19 and no household members with COVID-19 like illness.     Left message to call back to review procedure instructions.

## 2021-08-30 NOTE — Telephone Encounter (Signed)
See MyChart message 08/30/21

## 2021-08-31 ENCOUNTER — Ambulatory Visit (HOSPITAL_COMMUNITY)
Admission: RE | Admit: 2021-08-31 | Discharge: 2021-08-31 | Disposition: A | Discharge status: Home / Self Care | Payer: Medicare HMO | Attending: Internal Medicine | Admitting: Internal Medicine

## 2021-08-31 ENCOUNTER — Other Ambulatory Visit: Payer: Self-pay

## 2021-08-31 ENCOUNTER — Encounter (HOSPITAL_COMMUNITY): Payer: Self-pay | Admitting: Internal Medicine

## 2021-08-31 ENCOUNTER — Encounter (HOSPITAL_COMMUNITY)
Admission: RE | Disposition: A | Discharge status: Home / Self Care | Payer: Self-pay | Source: Home / Self Care | Attending: Internal Medicine

## 2021-08-31 DIAGNOSIS — E785 Hyperlipidemia, unspecified: Secondary | ICD-10-CM | POA: Insufficient documentation

## 2021-08-31 DIAGNOSIS — I25119 Atherosclerotic heart disease of native coronary artery with unspecified angina pectoris: Secondary | ICD-10-CM | POA: Diagnosis not present

## 2021-08-31 DIAGNOSIS — I2582 Chronic total occlusion of coronary artery: Secondary | ICD-10-CM | POA: Diagnosis not present

## 2021-08-31 DIAGNOSIS — I25110 Atherosclerotic heart disease of native coronary artery with unstable angina pectoris: Secondary | ICD-10-CM | POA: Diagnosis not present

## 2021-08-31 DIAGNOSIS — Z955 Presence of coronary angioplasty implant and graft: Secondary | ICD-10-CM | POA: Insufficient documentation

## 2021-08-31 DIAGNOSIS — R002 Palpitations: Secondary | ICD-10-CM | POA: Insufficient documentation

## 2021-08-31 DIAGNOSIS — I130 Hypertensive heart and chronic kidney disease with heart failure and stage 1 through stage 4 chronic kidney disease, or unspecified chronic kidney disease: Secondary | ICD-10-CM | POA: Diagnosis not present

## 2021-08-31 DIAGNOSIS — I252 Old myocardial infarction: Secondary | ICD-10-CM | POA: Diagnosis not present

## 2021-08-31 DIAGNOSIS — Z7902 Long term (current) use of antithrombotics/antiplatelets: Secondary | ICD-10-CM | POA: Insufficient documentation

## 2021-08-31 DIAGNOSIS — Z8673 Personal history of transient ischemic attack (TIA), and cerebral infarction without residual deficits: Secondary | ICD-10-CM | POA: Insufficient documentation

## 2021-08-31 DIAGNOSIS — N183 Chronic kidney disease, stage 3 unspecified: Secondary | ICD-10-CM | POA: Insufficient documentation

## 2021-08-31 DIAGNOSIS — Z951 Presence of aortocoronary bypass graft: Secondary | ICD-10-CM | POA: Diagnosis not present

## 2021-08-31 DIAGNOSIS — I509 Heart failure, unspecified: Secondary | ICD-10-CM | POA: Insufficient documentation

## 2021-08-31 DIAGNOSIS — Z7982 Long term (current) use of aspirin: Secondary | ICD-10-CM | POA: Diagnosis not present

## 2021-08-31 HISTORY — PX: LEFT HEART CATH AND CORS/GRAFTS ANGIOGRAPHY: CATH118250

## 2021-08-31 SURGERY — LEFT HEART CATH AND CORS/GRAFTS ANGIOGRAPHY
Anesthesia: LOCAL

## 2021-08-31 MED ORDER — HEPARIN (PORCINE) IN NACL 1000-0.9 UT/500ML-% IV SOLN
INTRAVENOUS | Status: AC
  Filled 2021-08-31: qty 500

## 2021-08-31 MED ORDER — SODIUM CHLORIDE 0.9 % WEIGHT BASED INFUSION: 1.0000 mL/kg/h | INTRAVENOUS | Status: DC

## 2021-08-31 MED ORDER — LIDOCAINE HCL (PF) 1 % IJ SOLN
INTRAMUSCULAR | Status: DC | PRN
  Administered 2021-08-31: 2 mL

## 2021-08-31 MED ORDER — VERAPAMIL HCL 2.5 MG/ML IV SOLN
INTRAVENOUS | Status: DC | PRN
  Administered 2021-08-31: 10 mL via INTRA_ARTERIAL

## 2021-08-31 MED ORDER — FENTANYL CITRATE (PF) 100 MCG/2ML IJ SOLN
INTRAMUSCULAR | Status: DC | PRN
  Administered 2021-08-31 (×2): 25 ug via INTRAVENOUS

## 2021-08-31 MED ORDER — SODIUM CHLORIDE 0.9% FLUSH: 3.0000 mL | INTRAVENOUS | Status: DC | PRN

## 2021-08-31 MED ORDER — CLOPIDOGREL BISULFATE 75 MG PO TABS: 75.0000 mg | ORAL_TABLET | ORAL | Status: DC

## 2021-08-31 MED ORDER — ACETAMINOPHEN 325 MG PO TABS: 650.0000 mg | ORAL_TABLET | ORAL | Status: DC | PRN

## 2021-08-31 MED ORDER — SODIUM CHLORIDE 0.9% FLUSH: 3.0000 mL | Freq: Two times a day (BID) | INTRAVENOUS | Status: DC

## 2021-08-31 MED ORDER — SODIUM CHLORIDE 0.9 % WEIGHT BASED INFUSION
3.0000 mL/kg/h | INTRAVENOUS | Status: AC
  Administered 2021-08-31: 3 mL/kg/h via INTRAVENOUS

## 2021-08-31 MED ORDER — ONDANSETRON HCL 4 MG/2ML IJ SOLN: 4.0000 mg | Freq: Four times a day (QID) | INTRAMUSCULAR | Status: DC | PRN

## 2021-08-31 MED ORDER — VERAPAMIL HCL 2.5 MG/ML IV SOLN
INTRAVENOUS | Status: AC
  Filled 2021-08-31: qty 2

## 2021-08-31 MED ORDER — SODIUM CHLORIDE 0.9 % IV SOLN: INTRAVENOUS | Status: DC

## 2021-08-31 MED ORDER — MIDAZOLAM HCL 2 MG/2ML IJ SOLN
INTRAMUSCULAR | Status: DC | PRN
  Administered 2021-08-31 (×2): 1 mg via INTRAVENOUS

## 2021-08-31 MED ORDER — MIDAZOLAM HCL 2 MG/2ML IJ SOLN
INTRAMUSCULAR | Status: AC
  Filled 2021-08-31: qty 2

## 2021-08-31 MED ORDER — FENTANYL CITRATE (PF) 100 MCG/2ML IJ SOLN
INTRAMUSCULAR | Status: AC
  Filled 2021-08-31: qty 2

## 2021-08-31 MED ORDER — HEPARIN SODIUM (PORCINE) 1000 UNIT/ML IJ SOLN
INTRAMUSCULAR | Status: DC | PRN
  Administered 2021-08-31: 5000 [IU] via INTRAVENOUS

## 2021-08-31 MED ORDER — HEPARIN SODIUM (PORCINE) 1000 UNIT/ML IJ SOLN
INTRAMUSCULAR | Status: AC
  Filled 2021-08-31: qty 10

## 2021-08-31 MED ORDER — IOHEXOL 350 MG/ML SOLN
INTRAVENOUS | Status: DC | PRN
  Administered 2021-08-31: 130 mL

## 2021-08-31 MED ORDER — SODIUM CHLORIDE 0.9 % IV SOLN: 250.0000 mL | INTRAVENOUS | Status: DC | PRN

## 2021-08-31 MED ORDER — LIDOCAINE HCL (PF) 1 % IJ SOLN
INTRAMUSCULAR | Status: AC
  Filled 2021-08-31: qty 30

## 2021-08-31 MED ORDER — LABETALOL HCL 5 MG/ML IV SOLN: 10.0000 mg | INTRAVENOUS | Status: DC | PRN

## 2021-08-31 MED ORDER — HYDRALAZINE HCL 20 MG/ML IJ SOLN: 10.0000 mg | INTRAMUSCULAR | Status: DC | PRN

## 2021-08-31 MED ORDER — HEPARIN (PORCINE) IN NACL 1000-0.9 UT/500ML-% IV SOLN
INTRAVENOUS | Status: DC | PRN
  Administered 2021-08-31 (×2): 500 mL

## 2021-08-31 MED ORDER — ASPIRIN 81 MG PO CHEW: 81.0000 mg | CHEWABLE_TABLET | ORAL | Status: DC

## 2021-08-31 SURGICAL SUPPLY — 12 items
CATH INFINITI 5 FR IM (CATHETERS) ×1 IMPLANT
CATH INFINITI 5 FR MPA2 (CATHETERS) ×1 IMPLANT
CATH INFINITI 6F ANG MULTIPACK (CATHETERS) ×1 IMPLANT
CATH INFINITI 6F FL3.5 (CATHETERS) ×1 IMPLANT
DEVICE RAD COMP TR BAND LRG (VASCULAR PRODUCTS) ×1 IMPLANT
GLIDESHEATH SLEND SS 6F .021 (SHEATH) ×1 IMPLANT
GUIDEWIRE INQWIRE 1.5J.035X260 (WIRE) IMPLANT
INQWIRE 1.5J .035X260CM (WIRE) ×2
KIT HEART LEFT (KITS) ×3 IMPLANT
PACK CARDIAC CATHETERIZATION (CUSTOM PROCEDURE TRAY) ×3 IMPLANT
TRANSDUCER W/STOPCOCK (MISCELLANEOUS) ×3 IMPLANT
TUBING CIL FLEX 10 FLL-RA (TUBING) ×3 IMPLANT

## 2021-08-31 NOTE — Interval H&P Note (Signed)
History and Physical Interval Note:  08/31/2021 6:47 AM  Jared Roberts  has presented today for surgery, with the diagnosis of angina.  The various methods of treatment have been discussed with the patient and family. After consideration of risks, benefits and other options for treatment, the patient has consented to  Procedure(s): LEFT HEART CATH AND CORS/GRAFTS ANGIOGRAPHY (N/A) as a surgical intervention.  The patient's history has been reviewed, patient examined, no change in status, stable for surgery.  I have reviewed the patient's chart and labs.  Questions were answered to the patient's satisfaction.    Cath Lab Visit (complete for each Cath Lab visit)  Clinical Evaluation Leading to the Procedure:   ACS: No.  Non-ACS:    Anginal Classification: CCS II  Anti-ischemic medical therapy: Maximal Therapy (2 or more classes of medications)  Non-Invasive Test Results: No non-invasive testing performed  Prior CABG: Previous CABG        [name]

## 2021-09-05 ENCOUNTER — Other Ambulatory Visit: Payer: Self-pay | Admitting: Cardiovascular Disease

## 2021-09-17 ENCOUNTER — Encounter: Payer: Self-pay | Admitting: Cardiovascular Disease

## 2021-09-19 ENCOUNTER — Ambulatory Visit: Payer: Medicare HMO | Admitting: Cardiovascular Disease

## 2021-09-19 ENCOUNTER — Other Ambulatory Visit: Payer: Self-pay | Admitting: Cardiovascular Disease

## 2021-09-19 ENCOUNTER — Encounter (HOSPITAL_COMMUNITY): Payer: Self-pay | Admitting: Internal Medicine

## 2021-09-19 LAB — CARDIAC CATHETERIZATION: Cath EF Quantitative: 55 %

## 2021-09-21 DIAGNOSIS — D229 Melanocytic nevi, unspecified: Secondary | ICD-10-CM | POA: Diagnosis not present

## 2021-09-21 DIAGNOSIS — Z85828 Personal history of other malignant neoplasm of skin: Secondary | ICD-10-CM | POA: Diagnosis not present

## 2021-09-21 DIAGNOSIS — Z85820 Personal history of malignant melanoma of skin: Secondary | ICD-10-CM | POA: Diagnosis not present

## 2021-09-21 DIAGNOSIS — Z23 Encounter for immunization: Secondary | ICD-10-CM | POA: Diagnosis not present

## 2021-09-21 DIAGNOSIS — L821 Other seborrheic keratosis: Secondary | ICD-10-CM | POA: Diagnosis not present

## 2021-09-21 DIAGNOSIS — D2261 Melanocytic nevi of right upper limb, including shoulder: Secondary | ICD-10-CM | POA: Diagnosis not present

## 2021-09-21 DIAGNOSIS — D2371 Other benign neoplasm of skin of right lower limb, including hip: Secondary | ICD-10-CM | POA: Diagnosis not present

## 2021-09-21 DIAGNOSIS — L578 Other skin changes due to chronic exposure to nonionizing radiation: Secondary | ICD-10-CM | POA: Diagnosis not present

## 2021-10-09 ENCOUNTER — Other Ambulatory Visit: Payer: Self-pay | Admitting: Cardiovascular Disease

## 2021-10-23 ENCOUNTER — Encounter: Payer: Self-pay | Admitting: Cardiovascular Disease

## 2021-10-23 NOTE — Progress Notes (Signed)
?Cardiology Office Note:   ? ?Date:  10/24/2021  ? ?ID:  Jared Roberts, DOB 1945-12-30, MRN [id number] ? ?PCP:  Deland Pretty, MD  ?Cardiologist:  Shelbylynn Walczyk  ?Electrophysiologist:  None  ? ?Referring MD: Deland Pretty, MD  ? ?Chief Complaint  ?Patient presents with  ? Coronary Artery Disease  ?   ?  ? Hyperlipidemia  ? ? ?January 31, 2019   ? ?Jared Roberts is a 76 y.o. male with a hx of  HTN, TIA and carotid stenosis . ? ?Has been having some chest pain  ?A week ago, mowed the lawn,  Had CP and profound fatigue. ?Upper left chest ,  Left shoulder and arm,  Left jaw.   ?Comes and goes ,  Last for seconds - minutes ,   Resolves if he stops to rest.  ?Associated with dyspnea but also has dyspnea on its own.  ?Doesn't exercise,  Works in the yard . Can climb 2 flights of stairs - makes him breath hard.  ? ?Takes a nap frequently  ? ?Retired from Nurse, children's.  ?Non smoker, ?Drinks 1 drink per night  ? ?March 26, 2019:  ?Jared Roberts is seen back today for a follow-up office visit.  He recently had a heart catheterization which revealed severe coronary artery disease and he had coronary artery bypass grafting. ?BP has been variable ?Lower in the am ?Higher in the PM ?Cannot tell when his BP is high or low ?Chest is sore but no angina  ?Exercising some - walking around the house .   ?Has a referral for cardiac rehab ? ?May 08, 2019: ?Jared Roberts is seen back today for follow-up of his coronary artery disease.  He status post coronary artery bypass grafting.  He had early graft failure and had a PCI of his left circumflex artery. ?He presented several weeks later with angina  and was found restenosis of his LCx.   He had stenting of his LCX and is now seen for follow up  ? ? ?Still is not feeling well ?Is really tired.  ?He is 8 weeks out from his CABG  ?1 month out from graft closure and PCI ?2 weeks out from 2nd PCI .  ? ?No angina .   Has chest wall pain  ? ?Brought Pressure log with him.  His evening blood pressures were  running a little high.  We recently increased his Benicar up to 20 mg twice a day. ? ?Has back pain since his cardiac arrest on Sept. 14.  ?Was given flexeril which helps but makes him sleepy .  ?Has developed double vision starting a week ago.   ?Has Right cranial nerve IV palsy .   ? ?Has continued back pain  ? ?May 09, 2019: ?Was seen yesterday late morning ?Left the office, went to [employer] ?Felt very poorly  ?Took a nap.  Woke up,  HR was 120  ?Took a SL NTG .   Measured BP which was low  ?Took metoprolol 50 mg ,   ?Eventually felt better.  ?Review of labs from yesterday suggest volume depletion ?Hb is normal ?ECG is normal sinus rhythm  today  ?Will give him propranolol to take as needed.  ? ?June 26, 2019: ? ?Overall doing well ?Rare episodes of CP  ?Woke up with GERD 2 nights ago. ?Took Tums  ?GERD symptoms returned.  Took NTG and pains resolved. Quickly  ?Has a hx of heartburn ,  This resolved after the CABG. ? ?  September 24, 2019: ?Jared Roberts is seen back for follow-up visit. ?Thinks he is losing ground  ?Is not able to walk slowly ?No angina ?Has twinges.   No cp .   Is fatigued.  ?Does not have the energy to walk much or walk quickly  ?CABG was Aug. 19.  ?tnen had MI on Sept 14 and sept 28  ? ?Still having back pain which radiates around to his stomach  ? ?We discussed his lipid levels.  His triglyceride level had gone from 187 up to 285.  His LDL cholesterol was 57.  It turns out he had had a large fried seafood platter the week before his blood draw and also ate pizza day or so before his blood draw. ? ?It turns out that he is tried rosuvastatin in the past but developed severe aches with that.  We tried him on atorvastatin and he developed severe muscle aches with that.  He is gradually worked his way down to 10 mg which he seems to be tolerating fairly well but it is unclear that that is going to provide any benefit from a cardiovascular risk standpoint.  We will refer him to the lipid clinic for  consideration for PCSK9 inhibitors. ? ?December 24, 2019: ? ?Jared Roberts is seen today for follow-up of his coronary artery disease.  He has had coronary artery bypass grafting.  He then has had 2 subsequent PCI's. ? ?Had a stomach ache for a month.   Does not know the etiology  ?No cp , no dyspnea  ?Exercising a little.   Going for back PT ?I reviewed his recent labs - LDL is 64. ?Trigs are slighlty elevated at 157.  ? ?Feb. 16, 2022: ?Jared Roberts is seen today for questions about his BP .   Seen with wife , [name] .  ?He has had some arrhythmias and measured a HR of 30s this past week.  ?Has been having more CP in the evening ,  Started Imdur 30 mg in the am which has seemed to help reduce his NTG usage.   ? ?Has started going to [facility] and has been swimming - without angina  ?Has had some wheezing initially , which has resolved.  ?Swims [phone] yards per session .  ?One evening ( 6-8 hours after swimming) he noticed palps .   " hard heart beats .  HR with the pulse oximeter showed HR D To be 30s   ( symptoms sound like PVCs )   ? recorded an ECG on his [facility] Red  ? ?Sounds like he may be volume depleted.   His HR stays high for several hours after swimming  ? ?January 21, 2021: ?Seen with wife, [name]  ?Jared Roberts is seen for follow up of his CAD, CABG,  ?Has been having some palpitations ?Event monitor showed occasional PVCs and PACs  ? ?Has been working on his deck.  Replacing with a composite wood ?Needs to rebuild the support beams  ?No angina  ?Working out at [employer]  ?Wt is 176 lbs  ? ?Has been doing some research on sodium levels and subsequent CHF / LVH.   Has relayed this info in Patient advice requests. . ? ?Recent echo shows mild RV enlargement with moderate RV dysfunction  ?Has had a sleep study - did not show sleep apnea  ?No signs or symptoms of pulmonary embolus  ? ?Has not required a NTG  ?I had discussed with Dayna the possibility of repeating cath . ?Clinically he is not having  any clinical issues  ? ? ?July 06, 2021: ?Seen with wife, [name] . ? ?Jared Roberts is seen today for follow-up of his coronary artery disease.  He is exercising and working out on a regular basis.  Weight today is 177 pounds. ? ?Has not been swimming ,  ?Still working on his deck . ?Has carotid bruits ?Is being followed by [facility]  ? ?We discussed holding his Imdur  ? ?Feb. 2, 2023; ?Jared Roberts is seen for follow up of his CAD  ?Was seen in the  [place] ER for CP on Jan. 18, 2023 ?Trops were negative ?Still not feeling well.   ?Has continued to have dizziness, weakness ?Has some angina like pains - similar to prior to his CABG .  ?He held his Imdur but this did not affect his CP or his dizziness.   ? ?His chest pain is a throbbing pain ,  might radiate to his left shoulder .  Belt like sensation  ? ?We discussed increasing Imdur or perhaps starting Ranexa 500 PO BID .  ?He would like to have a coronary CT angio instead of going straight to cath . ?Will send a message to Dr. Marisue Ivan to see if a Cor CTA would be interpretable .  If we cannot get the info from a CCA, will need to do a cardiac cath .  ? ?October 24, 2021 ?Jared Roberts is seen today for follow up of his unstable angina . ?He was having angina at his last vist ?Cath showed ? ?  Prox LAD to Mid LAD lesion is 60% stenosed. ?  Mid LAD lesion is 60% stenosed. ?  Dist Cx lesion is 70% stenosed. ?  Prox RCA lesion is 90% stenosed. ?  Origin to Prox Graft lesion is 100% stenosed. ?  Dist RCA lesion is 100% stenosed. ?  Prox Cx lesion is 10% stenosed. ?  SVG. ?  LIMA and is normal in caliber. ?  LV end diastolic pressure is normal. ?  ?1.  Patent LIMA to LAD and vein graft to PDA; the vein graft to obtuse marginal was not imaged as it is known occluded. ?2.  Patent mid left circumflex stent.   ?3.  Preserved LV function with LVEDP of 6 to 8 mmHg. ?  ?Recommendation: Medical therapy ? ?I have personally reviewed the cath films  ?Is feeling better   - no further episodes of CP  ?Encouraged him to work out regularly . ?Wants to  get back to swimming .  ? ?Past Medical History:  ?Diagnosis Date  ? Carotid artery disease ([place])   ? CKD (chronic kidney disease), stage III ([place])   ? Coronary artery disease   ? s/p CABG 02/2009 with  LIMA-LAD, SV

## 2021-10-24 ENCOUNTER — Encounter: Payer: Self-pay | Admitting: Cardiovascular Disease

## 2021-10-24 ENCOUNTER — Ambulatory Visit: Payer: Medicare HMO | Admitting: Cardiovascular Disease

## 2021-10-24 VITALS — BP 122/72 | HR 68 | Ht 70.0 in | Wt 178.0 lb

## 2021-10-24 DIAGNOSIS — I2510 Atherosclerotic heart disease of native coronary artery without angina pectoris: Secondary | ICD-10-CM | POA: Diagnosis not present

## 2021-10-24 DIAGNOSIS — E785 Hyperlipidemia, unspecified: Secondary | ICD-10-CM

## 2021-10-24 DIAGNOSIS — I119 Hypertensive heart disease without heart failure: Secondary | ICD-10-CM | POA: Diagnosis not present

## 2021-10-24 LAB — BASIC METABOLIC PANEL
BUN/Creatinine Ratio: 17 (ref 10–24)
BUN: 18 mg/dL (ref 8–27)
CO2: 23 mmol/L (ref 20–29)
Calcium: 9.8 mg/dL (ref 8.6–10.2)
Chloride: 103 mmol/L (ref 96–106)
Creatinine, Ser: 1.08 mg/dL (ref 0.76–1.27)
Glucose: 101 mg/dL — ABNORMAL HIGH (ref 70–99)
Potassium: 4.1 mmol/L (ref 3.5–5.2)
Sodium: 139 mmol/L (ref 134–144)
eGFR: 70 mL/min/{1.73_m2} (ref >59)

## 2021-10-24 LAB — LIPID PANEL
Chol/HDL Ratio: 2.5 ratio (ref 0.0–5.0)
Cholesterol, Total: 148 mg/dL (ref 100–199)
HDL: 59 mg/dL (ref >39)
LDL Chol Calc (NIH): 51 mg/dL (ref 0–99)
Triglycerides: 243 mg/dL — ABNORMAL HIGH (ref 0–149)
VLDL Cholesterol Cal: 38 mg/dL (ref 5–40)

## 2021-10-24 LAB — ALT: ALT: 19 IU/L (ref 0–44)

## 2021-10-24 NOTE — Patient Instructions (Signed)
Medication Instructions:  ?Your physician recommends that you continue on your current medications as directed. Please refer to the Current Medication list given to you today. ? ?*If you need a refill on your cardiac medications before your next appointment, please call your pharmacy* ? ?Lab Work: ?TODAY: Lipids, ALT, BMP ?If you have labs (blood work) drawn today and your tests are completely normal, you will receive your results only by: ?MyChart Message (if you have MyChart) OR ?A paper copy in the mail ?If you have any lab test that is abnormal or we need to change your treatment, we will call you to review the results. ? ?Testing/Procedures: ?NONE ? ?Follow-Up: ?At [facility], you and your health needs are our priority.  As part of our continuing mission to provide you with exceptional heart care, we have created designated Provider Care Teams.  These Care Teams include your primary Cardiologist (physician) and Advanced Practice Providers (APPs -  Physician Assistants and Nurse Practitioners) who all work together to provide you with the care you need, when you need it. ? ?Your next appointment:   ?6 month(s) ? ?The format for your next appointment:   ?In Person ? ?Provider:   ?Robbie Lis, PA-C or Richardson Dopp, PA-C  ?

## 2021-11-01 DIAGNOSIS — Z125 Encounter for screening for malignant neoplasm of prostate: Secondary | ICD-10-CM | POA: Diagnosis not present

## 2021-11-01 DIAGNOSIS — I10 Essential (primary) hypertension: Secondary | ICD-10-CM | POA: Diagnosis not present

## 2021-11-01 DIAGNOSIS — E7801 Familial hypercholesterolemia: Secondary | ICD-10-CM | POA: Diagnosis not present

## 2021-11-06 ENCOUNTER — Other Ambulatory Visit: Payer: Self-pay | Admitting: Physician Assistant

## 2021-11-08 DIAGNOSIS — Z0000 Encounter for general adult medical examination without abnormal findings: Secondary | ICD-10-CM | POA: Diagnosis not present

## 2021-11-08 DIAGNOSIS — I6523 Occlusion and stenosis of bilateral carotid arteries: Secondary | ICD-10-CM | POA: Diagnosis not present

## 2021-11-08 DIAGNOSIS — M810 Age-related osteoporosis without current pathological fracture: Secondary | ICD-10-CM | POA: Diagnosis not present

## 2021-11-08 DIAGNOSIS — M8580 Other specified disorders of bone density and structure, unspecified site: Secondary | ICD-10-CM | POA: Diagnosis not present

## 2021-11-08 DIAGNOSIS — Z8546 Personal history of malignant neoplasm of prostate: Secondary | ICD-10-CM | POA: Diagnosis not present

## 2021-11-08 DIAGNOSIS — M542 Cervicalgia: Secondary | ICD-10-CM | POA: Diagnosis not present

## 2021-11-08 DIAGNOSIS — I10 Essential (primary) hypertension: Secondary | ICD-10-CM | POA: Diagnosis not present

## 2021-11-08 DIAGNOSIS — I25119 Atherosclerotic heart disease of native coronary artery with unspecified angina pectoris: Secondary | ICD-10-CM | POA: Diagnosis not present

## 2021-11-08 DIAGNOSIS — R062 Wheezing: Secondary | ICD-10-CM | POA: Diagnosis not present

## 2021-11-16 DIAGNOSIS — S40861A Insect bite (nonvenomous) of right upper arm, initial encounter: Secondary | ICD-10-CM | POA: Diagnosis not present

## 2021-11-16 DIAGNOSIS — W57XXXA Bitten or stung by nonvenomous insect and other nonvenomous arthropods, initial encounter: Secondary | ICD-10-CM | POA: Diagnosis not present

## 2021-11-19 ENCOUNTER — Other Ambulatory Visit: Payer: Self-pay

## 2021-11-19 ENCOUNTER — Encounter (HOSPITAL_BASED_OUTPATIENT_CLINIC_OR_DEPARTMENT_OTHER): Payer: Self-pay | Admitting: Emergency Medicine

## 2021-11-19 ENCOUNTER — Emergency Department (HOSPITAL_BASED_OUTPATIENT_CLINIC_OR_DEPARTMENT_OTHER)
Admission: EM | Admit: 2021-11-19 | Discharge: 2021-11-19 | Disposition: A | Discharge status: Home / Self Care | Payer: Medicare HMO | Attending: Emergency Medicine | Admitting: Emergency Medicine

## 2021-11-19 DIAGNOSIS — Z7902 Long term (current) use of antithrombotics/antiplatelets: Secondary | ICD-10-CM | POA: Insufficient documentation

## 2021-11-19 DIAGNOSIS — Z79899 Other long term (current) drug therapy: Secondary | ICD-10-CM | POA: Insufficient documentation

## 2021-11-19 DIAGNOSIS — K148 Other diseases of tongue: Secondary | ICD-10-CM | POA: Diagnosis not present

## 2021-11-19 DIAGNOSIS — K1370 Unspecified lesions of oral mucosa: Secondary | ICD-10-CM | POA: Insufficient documentation

## 2021-11-19 NOTE — ED Triage Notes (Signed)
Patient complain of pain to tongue onset yesterday that is causing inability to tolerate PO food or fluids. Pt noticed a red rash to right arm. Pt seen by PCP and was given doxycycline which he is still currently taking. ?

## 2021-11-19 NOTE — ED Notes (Signed)
Discharge instructions and follow up care reviewed and explained, pt verbalized understanding and had no further questions.  ?

## 2021-11-19 NOTE — Discharge Instructions (Signed)
Follow up with your doctor in 1-2 weeks.

## 2021-11-19 NOTE — ED Provider Notes (Signed)
?[facility] ?Provider Note ? ? ?CSN: [id number] ?Arrival date & time: 11/19/21  1108 ? ?  ? ?History ? ?Chief Complaint  ?Patient presents with  ? Mouth Lesions  ? ? ?Jared Roberts is a 76 y.o. male. ? ?Presents chief complaint of a tender spot on his tongue.  He has noticed this for about 3 to 4 days.  At same time he states that he had an insect bite on his right upper forearm with surrounding redness.  He has seen his primary care doctor and was given doxycycline which he has been taking.  The redness appears to be improving per patient.  Otherwise denies any fevers or cough no vomiting or diarrhea.  He states that the tender spot on his tongue is at the tip of the tongue and is painful when he touches anything with it or tries to eat food that touches that area.  He has been eating food on the other side of his mouth meanwhile.  Otherwise denies any new symptoms. ? ? ?  ? ?Home Medications ?Prior to Admission medications   ?Medication Sig Start Date End Date Taking? Authorizing Provider  ?ALPHA LIPOIC ACID PO Take 1 tablet by mouth daily.    [provider]  ?Ascorbic Acid (VITAMIN C PO) Take 1 tablet by mouth daily.    [provider]  ?atorvastatin (LIPITOR) 20 MG tablet TAKE ONE TABLET BY MOUTH DAILY 10/10/21   Nahser, Wonda Cheng, MD  ?calcium carbonate (TUMS - DOSED IN MG ELEMENTAL CALCIUM) 500 MG chewable tablet Chew 2 tablets by mouth as needed for indigestion or heartburn.     [provider]  ?Cholecalciferol (VITAMIN D) 2000 units CAPS Take 2,000 Units by mouth 2 (two) times a day.     [provider]  ?CHROMIUM PO Take by mouth daily.    [provider]  ?clopidogrel (PLAVIX) 75 MG tablet TAKE ONE TABLET BY MOUTH DAILY 09/05/21   Nahser, Wonda Cheng, MD  ?famotidine (PEPCID) 40 MG tablet Take 20 mg by mouth daily. Per patient taking 1/2 tablet of the 40 mg tabs.    [provider]  ?Fish Oil-Cholecalciferol (FISH OIL + D3 PO)  Take 1 capsule by mouth daily.    [provider]  ?hydrochlorothiazide (HYDRODIURIL) 25 MG tablet TAKE ONE TABLET BY MOUTH DAILY 07/01/21   Nahser, Wonda Cheng, MD  ?ibuprofen (ADVIL) 400 MG tablet Take 400 mg by mouth as needed for moderate pain.    [provider]  ?influenza vaccine adjuvanted (FLUAD QUADRIVALENT) 0.5 ML injection Inject into the muscle. 04/29/21     ?isosorbide mononitrate (IMDUR) 30 MG 24 hr tablet TAKE ONE TABLET BY MOUTH DAILY 09/19/21   Nahser, Wonda Cheng, MD  ?losartan (COZAAR) 100 MG tablet TAKE ONE TABLET BY MOUTH DAILY 08/02/21   Nahser, Wonda Cheng, MD  ?MAGNESIUM PO Take 300 mg by mouth daily.    [provider]  ?metoprolol tartrate (LOPRESSOR) 50 MG tablet TAKE ONE TABLET BY MOUTH TWICE A DAY 11/08/21   Nahser, Wonda Cheng, MD  ?Multiple Vitamin (MULTIVITAMIN WITH MINERALS) TABS tablet Take 1 tablet by mouth daily.    [provider]  ?nitroGLYCERIN (NITROSTAT) 0.4 MG SL tablet DISSOLVE 1 TAB UNDER TONGUE FOR CHEST PAIN - IF PAIN REMAINS AFTER 5 MIN, CALL 911 AND REPEAT DOSE. MAX 3 TABS IN 15 MINUTES 08/16/20   Nahser, Wonda Cheng, MD  ?potassium chloride (KLOR-CON) 10 MEQ tablet TAKE ONE TABLET BY MOUTH DAILY  02/07/21   Nahser, Wonda Cheng, MD  ?Probiotic Product (PROBIOTIC DAILY PO) Take 1 capsule by mouth daily.     [provider]  ?   ? ?Allergies    ?Patient has no known allergies.   ? ?Review of Systems   ?Review of Systems  ?Constitutional:  Negative for fever.  ?HENT:  Negative for ear pain and sore throat.   ?Eyes:  Negative for pain.  ?Respiratory:  Negative for cough.   ?Cardiovascular:  Negative for chest pain.  ?Gastrointestinal:  Negative for abdominal pain.  ?Genitourinary:  Negative for flank pain.  ?Musculoskeletal:  Negative for back pain.  ?Neurological:  Negative for syncope.  ?All other systems reviewed and are negative. ? ?Physical Exam ?Updated Vital Signs ?BP (!) 145/98   Pulse 68   Temp 98.5 ?F (36.9 ?C)   Resp 14   Ht '5\' 10"'$  (1.778  m)   Wt 79.8 kg   SpO2 91%   BMI 25.25 kg/m?  ?Physical Exam ?Constitutional:   ?   Appearance: He is well-developed.  ?HENT:  ?   Head: Normocephalic.  ?   Nose: Nose normal.  ?   Mouth/Throat:  ?   Comments: Patient has a small 0.15 cm whitish discoloration at the tip of his tongue that is tender to palpation.  No additional mass or lesion noted.  Appears consistent with an inflamed papule of the tongue. ?Eyes:  ?   Extraocular Movements: Extraocular movements intact.  ?Cardiovascular:  ?   Rate and Rhythm: Normal rate.  ?Pulmonary:  ?   Effort: Pulmonary effort is normal.  ?Skin: ?   Coloration: Skin is not jaundiced.  ?Neurological:  ?   Mental Status: He is alert. Mental status is at baseline.  ? ? ?ED Results / Procedures / Treatments   ?Labs ?(all labs ordered are listed, but only abnormal results are displayed) ?Labs Reviewed - No data to display ? ?EKG ?None ? ?Radiology ?No results found. ? ?Procedures ?Procedures  ? ? ?Medications Ordered in ED ?Medications - No data to display ? ?ED Course/ Medical Decision Making/ A&P ?  ?                        ?Medical Decision Making ? ?History augmented by patient's wife at bedside. ? ?Chart review shows primary care doctor visit 3 days ago. ? ?No additional testing or studies were done.  Advised Tylenol or Motrin as needed for pain. ? ?Recommended outpatient follow-up with his doctor again within 1 week.  Advise completion of his doxycycline in the meanwhile. ? ? ? ? ? ? ? ? ? ?Final Clinical Impression(s) / ED Diagnoses ?Final diagnoses:  ?Tongue lesion  ? ? ?Rx / DC Orders ?ED Discharge Orders   ? ? None  ? ?  ? ? ?  ?Luna Fuse, MD ?11/19/21 1338 ? ?

## 2021-11-22 DIAGNOSIS — R238 Other skin changes: Secondary | ICD-10-CM | POA: Diagnosis not present

## 2021-11-22 DIAGNOSIS — S00522A Blister (nonthermal) of oral cavity, initial encounter: Secondary | ICD-10-CM | POA: Diagnosis not present

## 2021-11-22 DIAGNOSIS — R21 Rash and other nonspecific skin eruption: Secondary | ICD-10-CM | POA: Diagnosis not present

## 2021-12-01 ENCOUNTER — Other Ambulatory Visit: Payer: Self-pay | Admitting: Cardiovascular Disease

## 2021-12-16 ENCOUNTER — Other Ambulatory Visit: Payer: Medicare HMO

## 2021-12-23 ENCOUNTER — Ambulatory Visit: Payer: Medicare HMO | Admitting: Nurse Practitioner

## 2021-12-27 ENCOUNTER — Encounter: Payer: Self-pay | Admitting: Cardiovascular Disease

## 2022-01-29 ENCOUNTER — Other Ambulatory Visit: Payer: Self-pay | Admitting: Cardiovascular Disease

## 2022-02-06 DIAGNOSIS — H5213 Myopia, bilateral: Secondary | ICD-10-CM | POA: Diagnosis not present

## 2022-02-06 DIAGNOSIS — H2513 Age-related nuclear cataract, bilateral: Secondary | ICD-10-CM | POA: Diagnosis not present

## 2022-03-07 DIAGNOSIS — J029 Acute pharyngitis, unspecified: Secondary | ICD-10-CM | POA: Diagnosis not present

## 2022-03-07 DIAGNOSIS — K219 Gastro-esophageal reflux disease without esophagitis: Secondary | ICD-10-CM | POA: Diagnosis not present

## 2022-03-26 ENCOUNTER — Other Ambulatory Visit: Payer: Self-pay | Admitting: Cardiovascular Disease

## 2022-04-14 ENCOUNTER — Other Ambulatory Visit (HOSPITAL_BASED_OUTPATIENT_CLINIC_OR_DEPARTMENT_OTHER): Payer: Self-pay

## 2022-04-14 MED ORDER — INFLUENZA VAC A&B SA ADJ QUAD 0.5 ML IM PRSY
PREFILLED_SYRINGE | INTRAMUSCULAR | 0 refills | Status: DC
  Filled 2022-04-14: qty 0.5, 1d supply, fill #0

## 2022-04-19 DIAGNOSIS — K219 Gastro-esophageal reflux disease without esophagitis: Secondary | ICD-10-CM | POA: Diagnosis not present

## 2022-04-19 DIAGNOSIS — R0989 Other specified symptoms and signs involving the circulatory and respiratory systems: Secondary | ICD-10-CM | POA: Diagnosis not present

## 2022-04-25 ENCOUNTER — Ambulatory Visit: Payer: Medicare HMO | Admitting: Physician Assistant

## 2022-05-04 ENCOUNTER — Encounter: Payer: Self-pay | Admitting: Physician Assistant

## 2022-05-04 ENCOUNTER — Ambulatory Visit: Payer: Medicare HMO | Attending: Physician Assistant | Admitting: Physician Assistant

## 2022-05-04 VITALS — BP 122/88 | HR 75 | Ht 70.0 in | Wt 178.0 lb

## 2022-05-04 DIAGNOSIS — I2510 Atherosclerotic heart disease of native coronary artery without angina pectoris: Secondary | ICD-10-CM | POA: Diagnosis not present

## 2022-05-04 DIAGNOSIS — E785 Hyperlipidemia, unspecified: Secondary | ICD-10-CM

## 2022-05-04 DIAGNOSIS — I10 Essential (primary) hypertension: Secondary | ICD-10-CM

## 2022-05-04 NOTE — Patient Instructions (Signed)
Medication Instructions:  Your physician recommends that you continue on your current medications as directed. Please refer to the Current Medication list given to you today. *If you need a refill on your cardiac medications before your next appointment, please call your pharmacy*   Lab Work: None Ordered If you have labs (blood work) drawn today and your tests are completely normal, you will receive your results only by: [place] (if you have MyChart) OR A paper copy in the mail If you have any lab test that is abnormal or we need to change your treatment, we will call you to review the results.   Testing/Procedures: None Ordered   Follow-Up: At [facility], you and your health needs are our priority.  As part of our continuing mission to provide you with exceptional heart care, we have created designated Provider Care Teams.  These Care Teams include your primary Cardiologist (physician) and Advanced Practice Providers (APPs -  Physician Assistants and Nurse Practitioners) who all work together to provide you with the care you need, when you need it.  We recommend signing up for the patient portal called "MyChart".  Sign up information is provided on this After Visit Summary.  MyChart is used to connect with patients for Virtual Visits (Telemedicine).  Patients are able to view lab/test results, encounter notes, upcoming appointments, etc.  Non-urgent messages can be sent to your provider as well.   To learn more about what you can do with MyChart, go to [email or web address].    Your next appointment:   6 month(s)  The format for your next appointment:   In Person  Provider:   Mertie Moores, MD     Other Instructions   Important Information About Sugar

## 2022-05-04 NOTE — Progress Notes (Signed)
Cardiology Office Note:    Date:  05/04/2022   ID:  Jared Roberts, DOB 05/30/1946, MRN [id number]  PCP:  Jared Pretty, MD  [facility] Cardiologist:  Mertie Moores, MD  [place] Electrophysiologist:  None   Chief Complaint: 6 months follow up  History of Present Illness:    Jared Roberts is a 76 y.o. male with a hx of CAD s/p /p CABG with early graft failure leading to VF arrest requiring PCI to graft,  HTN, TIA, carotid artery dz, dilated aortic root, HLD and CKD IIIa seen for follow up.   Hx of CAD CABG 02/2019 with  LIMA-LAD, SVG-OM, SVG-PDA NSTEMI/VF arrest 03/2019 felt due to early graft failure s/p PTCA to distal Cx/OM2, PCI later 04/21/2019 with DES to [facility] and PTCA to OM branch Last cath 08/2021 with patient LIMA to LAD and vein graft to PDA; the vein graft to obtuse marginal was not imaged as it is known occluded. Patent mid left circumflex stent.  >>> medical therapy  Last doppler 07/2021 with bilateral ICA with 1-39% stenosis.   Here today for follow up.  Denies any chest pain or shortness of breath.  No regular exercise but active doing yard work and household chores.  Compliant with medications.  No orthopnea, PND, syncope, lower extremity edema, melena or blood in the stool or urine.  Past Medical History:  Diagnosis Date   Carotid artery disease ([place])    CKD (chronic kidney disease), stage III ([place])    Coronary artery disease    s/p CABG 02/2009 with  LIMA-LAD, SVG-OM, SVG-PDA, NSTEMI/VF arrest 03/2019 felt due to early graft failure s/p PTCA to distal Cx/OM2, then repeat PCI later 03/2019 with DES to [facility] and PTCA to OM branch   Cranial nerve VI palsy    DDD (degenerative disc disease), cervical    Depression    Dilated aortic root (Jared Roberts)    ED (erectile dysfunction) of organic origin    Generalized osteoarthritis    GERD (gastroesophageal reflux disease)    Hiatal hernia    History of chronic bronchitis    History of gastritis    2003   History of  Helicobacter pylori infection    2003   History of melanoma excision    2015--  right ear   History of prostate cancer urologist-  Jared Roberts   2007--  pT2a Nx Mx,  Gleason 3+3, S/P  PROSTATECTOMY   History of squamous cell carcinoma excision    2011  left knee   Hyperlipidemia    Hyperplastic colon polyp 2009   Hypertension    Male hypogonadism    Melanoma (Jared Roberts)    left 4th toe   Nephrolithiasis    Prostate cancer (Jared Roberts)    Sinus bradycardia    Ventricular fibrillation ([place]) 04/07/2019   VF, en route witnessed by EMS, defibrillated x 1    Past Surgical History:  Procedure Laterality Date   AMPUTATION TOE Left 09/07/2015   Procedure: PARTIAL AMPUTATION TOE 4TH LEFT;  Surgeon: [name], MD;  Location: Jared [facility];  Service: Podiatry;  Laterality: Left;   APPENDECTOMY  1970's   CORONARY ARTERY BYPASS GRAFT N/A 03/12/2019   Procedure: CORONARY ARTERY BYPASS GRAFTING (CABG) x 3 WITH ENDOSCOPIC HARVESTING OF RIGHT GREATER SAPHENOUS VEIN;  Surgeon: [name], MD;  Location: Jared Roberts;  Service: Open Heart Surgery;  Laterality: N/A;   CORONARY BALLOON ANGIOPLASTY N/A 04/07/2019   Procedure: CORONARY BALLOON ANGIOPLASTY;  Surgeon: [name],  MD;  Location: Jared Roberts CV LAB;  Service: Cardiovascular;  Laterality: N/A;   CORONARY STENT INTERVENTION N/A 04/21/2019   Procedure: CORONARY STENT INTERVENTION;  Surgeon: [name], MD;  Location: Jared Roberts CV LAB;  Service: Cardiovascular;  Laterality: N/A;   CYSTO/  CLOT EVACUATION POST PROSTATECTOMY  11-19-2005   CYSTO/  REMOVAL URETHRAL STONE AND URETHRAL STAPLES  06-02-2010   CYSTO/  URETEROSCOPIC STONE EXTRACTION  X2  1990's   KIDNEY STONE SURGERY     LEFT HEART CATH AND CORONARY ANGIOGRAPHY N/A 02/25/2019   Procedure: LEFT HEART CATH AND CORONARY ANGIOGRAPHY;  Surgeon: [name], MD;  Location: Jared Roberts CV LAB;  Service: Cardiovascular;  Laterality: N/A;   LEFT HEART CATH AND CORS/GRAFTS  ANGIOGRAPHY N/A 04/07/2019   Procedure: LEFT HEART CATH AND CORS/GRAFTS ANGIOGRAPHY;  Surgeon: [name], MD;  Location: Jared Roberts CV LAB;  Service: Cardiovascular;  Laterality: N/A;   LEFT HEART CATH AND CORS/GRAFTS ANGIOGRAPHY N/A 04/21/2019   Procedure: LEFT HEART CATH AND CORS/GRAFTS ANGIOGRAPHY;  Surgeon: [name], MD;  Location: [facility] CV LAB;  Service: Cardiovascular;  Laterality: N/A;   LEFT HEART CATH AND CORS/GRAFTS ANGIOGRAPHY N/A 08/31/2021   Procedure: LEFT HEART CATH AND CORS/GRAFTS ANGIOGRAPHY;  Surgeon: [name], MD;  Location: Jared Roberts CV LAB;  Service: Cardiovascular;  Laterality: N/A;   MELANOMA EXCISION  2015   right ear   ROBOT ASSISTED LAPAROSCOPIC RADICAL PROSTATECTOMY  11-09-2005   SKIN CANCER EXCISION     TEE WITHOUT CARDIOVERSION N/A 03/12/2019   Procedure: TRANSESOPHAGEAL ECHOCARDIOGRAM (TEE);  Surgeon: [name], MD;  Location: Jared Roberts;  Service: Open Heart Surgery;  Laterality: N/A;   TONSILLECTOMY  child    Current Medications: Current Meds  Medication Sig   ALPHA LIPOIC ACID PO Take 1 tablet by mouth daily.   Ascorbic Acid (VITAMIN C PO) Take 1 tablet by mouth daily.   atorvastatin (LIPITOR) 20 MG tablet TAKE ONE TABLET BY MOUTH DAILY   calcium carbonate (TUMS - DOSED IN MG ELEMENTAL CALCIUM) 500 MG chewable tablet Chew 2 tablets by mouth as needed for indigestion or heartburn.    Cholecalciferol (VITAMIN D) 2000 units CAPS Take 2,000 Units by mouth 2 (two) times a day.    CHROMIUM PO Take by mouth daily.   clopidogrel (PLAVIX) 75 MG tablet TAKE ONE TABLET BY MOUTH DAILY   Fish Oil-Cholecalciferol (FISH OIL + D3 PO) Take 1 capsule by mouth daily.   hydrochlorothiazide (HYDRODIURIL) 25 MG tablet TAKE ONE TABLET BY MOUTH DAILY   ibuprofen (ADVIL) 400 MG tablet Take 400 mg by mouth as needed for moderate pain.   isosorbide mononitrate (IMDUR) 30 MG 24 hr tablet TAKE ONE TABLET BY MOUTH DAILY   losartan (COZAAR) 100 MG  tablet TAKE ONE TABLET BY MOUTH DAILY   MAGNESIUM PO Take 300 mg by mouth daily.   metoprolol tartrate (LOPRESSOR) 50 MG tablet TAKE ONE TABLET BY MOUTH TWICE A DAY   Multiple Vitamin (MULTIVITAMIN WITH MINERALS) TABS tablet Take 1 tablet by mouth daily.   nitroGLYCERIN (NITROSTAT) 0.4 MG SL tablet DISSOLVE 1 TAB UNDER TONGUE FOR CHEST PAIN - IF PAIN REMAINS AFTER 5 MIN, CALL 911 AND REPEAT DOSE. MAX 3 TABS IN 15 MINUTES   pantoprazole (PROTONIX) 20 MG tablet Take 20 mg by mouth every other day.   potassium chloride (KLOR-CON) 10 MEQ tablet TAKE ONE TABLET BY MOUTH DAILY   Probiotic Product (PROBIOTIC DAILY PO) Take 1 capsule by mouth  daily.      Allergies:   Patient has no known allergies.   Social History   Socioeconomic History   Marital status: Married    Spouse name: [name]   Number of children: 1   Years of education: 18   Highest education level: Master's degree (e.g., MA, MS, MEng, MEd, MSW, MBA)  Occupational History   Occupation: retired    Comment: from Manufacturing engineer  Tobacco Use   Smoking status: Never   Smokeless tobacco: Never  Vaping Use   Vaping Use: Never used  Substance and Sexual Activity   Alcohol use: Yes    Comment: 1 glass of wine daily   Drug use: No   Sexual activity: Not on file    Comment: retired, married. 1 son  Other Topics Concern   Not on file  Social History Narrative   Not on file   Social Determinants of Health   Financial Resource Strain: Low Risk  (05/20/2019)   Overall Financial Resource Strain (CARDIA)    Difficulty of Paying Living Expenses: Not hard at all  Food Insecurity: No Food Insecurity (05/20/2019)   Hunger Vital Sign    Worried About Running Out of Food in the Last Year: Never true    Ran Out of Food in the Last Year: Never true  Transportation Needs: No Transportation Needs (05/20/2019)   PRAPARE - Hydrologist (Medical): No    Lack of Transportation (Non-Medical): No  Physical Activity:  Inactive (05/20/2019)   Exercise Vital Sign    Days of Exercise per Week: 0 days    Minutes of Exercise per Session: 0 min  Stress: No Stress Concern Present (05/20/2019)   [place]    Feeling of Stress : Not at all  Social Connections: Moderately Integrated (05/20/2019)   Social Connection and Isolation Panel [NHANES]    Frequency of Communication with Friends and Family: More than three times a week    Frequency of Social Gatherings with Friends and Family: Once a week    Attends Religious Services: 1 to 4 times per year    Active Member of [employer] or Organizations: No    Attends Music therapist: Never    Marital Status: Married     Family History: The patient's family history includes Arthritis in his mother; Congestive Heart Failure in his mother; Diabetes Mellitus II in his mother; Osteoarthritis in his mother; Other in his father.    ROS:   Please see the history of present illness.    All other systems reviewed and are negative.   EKGs/Labs/Other Studies Reviewed:    The following studies were reviewed today:  LEFT HEART CATH AND CORS/GRAFTS ANGIOGRAPHY  08/2021   Conclusion      Prox LAD to Mid LAD lesion is 60% stenosed.   Mid LAD lesion is 60% stenosed.   Dist Cx lesion is 70% stenosed.   Prox RCA lesion is 90% stenosed.   Origin to Prox Graft lesion is 100% stenosed.   Dist RCA lesion is 100% stenosed.   Prox Cx lesion is 10% stenosed.   SVG.   LIMA and is normal in caliber.   LV end diastolic pressure is normal.   1.  Patent LIMA to LAD and vein graft to PDA; the vein graft to obtuse marginal was not imaged as it is known occluded. 2.  Patent mid left circumflex stent.   3.  Preserved  LV function with LVEDP of 6 to 8 mmHg.   Recommendation: Medical therapy.  Diagnostic Dominance: Right  Echo 12/2020 1. Left ventricular ejection fraction, by estimation, is 60 to 65%. Left   ventricular ejection fraction by 3D volume is 66 %. The left ventricle has  normal function. The left ventricle demonstrates regional wall motion  abnormalities (see scoring  diagram/findings for description). Left ventricular diastolic parameters  are indeterminate. The average left ventricular global longitudinal strain  is 24.2 %. The global longitudinal strain is normal.   2. Right ventricular systolic function is moderately reduced. The right  ventricular size is mildly enlarged.   3. Right atrial size was mildly dilated.   4. The mitral valve is normal in structure. Trivial mitral valve  regurgitation. No evidence of mitral stenosis.   5. The aortic valve is grossly normal. There is mild calcification of the  aortic valve. Aortic valve regurgitation is trivial. Mild aortic valve  sclerosis is present, with no evidence of aortic valve stenosis.   6. Aortic dilatation noted. There is mild dilatation of the ascending  aorta, measuring 39 mm.   7. The inferior vena cava is normal in size with greater than 50%  respiratory variability, suggesting right atrial pressure of 3 mmHg.   Comparison(s): Changes from prior study are noted. EF slightly improved  from prior.    EKG:  EKG is not  ordered today.  Recent Labs: 08/29/2021: Hemoglobin 15.9; Platelets 189 10/24/2021: ALT 19; BUN 18; Creatinine, Ser 1.08; Potassium 4.1; Sodium 139  Recent Lipid Panel    Component Value Date/Time   CHOL 148 10/24/2021 1210   TRIG 243 (H) 10/24/2021 1210   HDL 59 10/24/2021 1210   CHOLHDL 2.5 10/24/2021 1210   CHOLHDL 3.4 04/08/2019 0300   VLDL 37 04/08/2019 0300   LDLCALC 51 10/24/2021 1210     Physical Exam:    VS:  BP 122/88   Pulse 75   Ht '5\' 10"'$  (1.778 m)   Wt 178 lb (80.7 kg)   SpO2 98%   BMI 25.54 kg/m     Wt Readings from Last 3 Encounters:  05/04/22 178 lb (80.7 kg)  11/19/21 176 lb (79.8 kg)  10/24/21 178 lb (80.7 kg)     GEN:  Well nourished, well developed in no acute  distress HEENT: Normal NECK: No JVD; No carotid bruits LYMPHATICS: No lymphadenopathy CARDIAC: RRR, no murmurs, rubs, gallops RESPIRATORY:  Clear to auscultation without rales, wheezing or rhonchi  ABDOMEN: Soft, non-tender, non-distended MUSCULOSKELETAL:  No edema; No deformity  SKIN: Warm and dry NEUROLOGIC:  Alert and oriented x 3 PSYCHIATRIC:  Normal affect   ASSESSMENT AND PLAN:    CAD No angina.  Continue Plavix, statin and beta-blocker.  2.  Hypertension Blood pressure stable and controlled on current medications.  3.  Hyperlipidemia -LDL excellently controlled.  Continue low-dose statin.  Medication Adjustments/Labs and Tests Ordered: Current medicines are reviewed at length with the patient today.  Concerns regarding medicines are outlined above.  No orders of the defined types were placed in this encounter.  No orders of the defined types were placed in this encounter.   Patient Instructions  Medication Instructions:  Your physician recommends that you continue on your current medications as directed. Please refer to the Current Medication list given to you today. *If you need a refill on your cardiac medications before your next appointment, please call your pharmacy*   Lab Work: None Ordered If you have labs (blood work)  drawn today and your tests are completely normal, you will receive your results only by: MyChart Message (if you have MyChart) OR A paper copy in the mail If you have any lab test that is abnormal or we need to change your treatment, we will call you to review the results.   Testing/Procedures: None Ordered   Follow-Up: At [facility], you and your health needs are our priority.  As part of our continuing mission to provide you with exceptional heart care, we have created designated Provider Care Teams.  These Care Teams include your primary Cardiologist (physician) and Advanced Practice Providers (APPs -  Physician Assistants  and Nurse Practitioners) who all work together to provide you with the care you need, when you need it.  We recommend signing up for the patient portal called "MyChart".  Sign up information is provided on this After Visit Summary.  MyChart is used to connect with patients for Virtual Visits (Telemedicine).  Patients are able to view lab/test results, encounter notes, upcoming appointments, etc.  Non-urgent messages can be sent to your provider as well.   To learn more about what you can do with MyChart, go to [email or web address].    Your next appointment:   6 month(s)  The format for your next appointment:   In Person  Provider:   Mertie Moores, MD     Other Instructions   Important Information About Sugar         Signed, Leanor Kail, [place]  05/04/2022 1:58 PM    [facility]

## 2022-06-07 DIAGNOSIS — K219 Gastro-esophageal reflux disease without esophagitis: Secondary | ICD-10-CM | POA: Diagnosis not present

## 2022-07-21 ENCOUNTER — Other Ambulatory Visit: Payer: Self-pay | Admitting: Cardiovascular Disease

## 2022-09-13 ENCOUNTER — Other Ambulatory Visit: Payer: Self-pay | Admitting: Cardiovascular Disease

## 2022-09-23 ENCOUNTER — Other Ambulatory Visit: Payer: Self-pay | Admitting: Cardiovascular Disease

## 2022-09-25 DIAGNOSIS — L570 Actinic keratosis: Secondary | ICD-10-CM | POA: Diagnosis not present

## 2022-09-25 DIAGNOSIS — D485 Neoplasm of uncertain behavior of skin: Secondary | ICD-10-CM | POA: Diagnosis not present

## 2022-09-25 DIAGNOSIS — D2261 Melanocytic nevi of right upper limb, including shoulder: Secondary | ICD-10-CM | POA: Diagnosis not present

## 2022-09-25 DIAGNOSIS — L578 Other skin changes due to chronic exposure to nonionizing radiation: Secondary | ICD-10-CM | POA: Diagnosis not present

## 2022-09-25 DIAGNOSIS — D2371 Other benign neoplasm of skin of right lower limb, including hip: Secondary | ICD-10-CM | POA: Diagnosis not present

## 2022-09-25 DIAGNOSIS — D2271 Melanocytic nevi of right lower limb, including hip: Secondary | ICD-10-CM | POA: Diagnosis not present

## 2022-09-25 DIAGNOSIS — Z85820 Personal history of malignant melanoma of skin: Secondary | ICD-10-CM | POA: Diagnosis not present

## 2022-09-25 DIAGNOSIS — Z85828 Personal history of other malignant neoplasm of skin: Secondary | ICD-10-CM | POA: Diagnosis not present

## 2022-09-25 DIAGNOSIS — D2272 Melanocytic nevi of left lower limb, including hip: Secondary | ICD-10-CM | POA: Diagnosis not present

## 2022-10-02 ENCOUNTER — Other Ambulatory Visit: Payer: Self-pay | Admitting: Cardiovascular Disease

## 2022-10-11 DIAGNOSIS — L570 Actinic keratosis: Secondary | ICD-10-CM | POA: Diagnosis not present

## 2022-10-11 DIAGNOSIS — D0430 Carcinoma in situ of skin of unspecified part of face: Secondary | ICD-10-CM | POA: Diagnosis not present

## 2022-10-27 ENCOUNTER — Other Ambulatory Visit: Payer: Self-pay | Admitting: Cardiovascular Disease

## 2022-10-31 ENCOUNTER — Other Ambulatory Visit: Payer: Self-pay | Admitting: *Deleted

## 2022-10-31 MED ORDER — METOPROLOL TARTRATE 50 MG PO TABS: 50.0000 mg | ORAL_TABLET | Freq: Two times a day (BID) | ORAL | 0 refills | Status: DC

## 2022-11-16 DIAGNOSIS — I10 Essential (primary) hypertension: Secondary | ICD-10-CM | POA: Diagnosis not present

## 2022-11-16 DIAGNOSIS — Z125 Encounter for screening for malignant neoplasm of prostate: Secondary | ICD-10-CM | POA: Diagnosis not present

## 2022-11-20 DIAGNOSIS — Z0000 Encounter for general adult medical examination without abnormal findings: Secondary | ICD-10-CM | POA: Diagnosis not present

## 2022-11-20 DIAGNOSIS — Z8546 Personal history of malignant neoplasm of prostate: Secondary | ICD-10-CM | POA: Diagnosis not present

## 2022-11-20 DIAGNOSIS — R809 Proteinuria, unspecified: Secondary | ICD-10-CM | POA: Diagnosis not present

## 2022-11-20 DIAGNOSIS — I10 Essential (primary) hypertension: Secondary | ICD-10-CM | POA: Diagnosis not present

## 2022-11-20 DIAGNOSIS — K219 Gastro-esophageal reflux disease without esophagitis: Secondary | ICD-10-CM | POA: Diagnosis not present

## 2022-11-20 DIAGNOSIS — E782 Mixed hyperlipidemia: Secondary | ICD-10-CM | POA: Diagnosis not present

## 2022-11-20 DIAGNOSIS — I25118 Atherosclerotic heart disease of native coronary artery with other forms of angina pectoris: Secondary | ICD-10-CM | POA: Diagnosis not present

## 2022-11-20 DIAGNOSIS — Z8673 Personal history of transient ischemic attack (TIA), and cerebral infarction without residual deficits: Secondary | ICD-10-CM | POA: Diagnosis not present

## 2022-11-20 DIAGNOSIS — M150 Primary generalized (osteo)arthritis: Secondary | ICD-10-CM | POA: Diagnosis not present

## 2022-11-20 DIAGNOSIS — M8580 Other specified disorders of bone density and structure, unspecified site: Secondary | ICD-10-CM | POA: Diagnosis not present

## 2022-11-20 DIAGNOSIS — I252 Old myocardial infarction: Secondary | ICD-10-CM | POA: Diagnosis not present

## 2022-11-20 DIAGNOSIS — R9430 Abnormal result of cardiovascular function study, unspecified: Secondary | ICD-10-CM | POA: Diagnosis not present

## 2022-11-21 LAB — LAB REPORT - SCANNED
Creatinine, POC: 152.4 mg/dL
EGFR: 64
Microalb Creat Ratio: 10
Microalbumin, Urine: 1.6

## 2022-11-26 ENCOUNTER — Other Ambulatory Visit: Payer: Self-pay | Admitting: Cardiovascular Disease

## 2023-01-18 ENCOUNTER — Other Ambulatory Visit: Payer: Self-pay | Admitting: Cardiovascular Disease

## 2023-02-05 ENCOUNTER — Other Ambulatory Visit: Payer: Self-pay | Admitting: Cardiovascular Disease

## 2023-02-05 MED ORDER — METOPROLOL TARTRATE 50 MG PO TABS: 50.0000 mg | ORAL_TABLET | Freq: Two times a day (BID) | ORAL | 0 refills | Status: DC

## 2023-03-27 ENCOUNTER — Other Ambulatory Visit: Payer: Self-pay | Admitting: Cardiovascular Disease

## 2023-03-28 DIAGNOSIS — Z86010 Personal history of colonic polyps: Secondary | ICD-10-CM | POA: Diagnosis not present

## 2023-03-28 DIAGNOSIS — Z7901 Long term (current) use of anticoagulants: Secondary | ICD-10-CM | POA: Diagnosis not present

## 2023-03-29 ENCOUNTER — Telehealth: Payer: Self-pay | Admitting: *Deleted

## 2023-03-29 NOTE — Telephone Encounter (Signed)
   Name: Jared Roberts  DOB: 11-02-1945  MRN: [id number]  Primary Cardiologist: Kristeen Miss, MD  Chart reviewed as part of pre-operative protocol coverage. Because of Jared Roberts past medical history and time since last visit, he will require a follow-up in-office visit in order to better assess preoperative cardiovascular risk.  Pre-op covering staff: - Please schedule appointment and call patient to inform them. If patient already had an upcoming appointment within acceptable timeframe, please add "pre-op clearance" to the appointment notes so provider is aware. - Please contact requesting surgeon's office via preferred method (i.e, phone, fax) to inform them of need for appointment prior to surgery.  Per office protocol, if patient is without any new symptoms or concerns at the time of their virtual visit, he/she may hold Plavix for 5 days prior to procedure. Please resume Plavix as soon as possible postprocedure, at the discretion of the surgeon.   Perlie Gold, PA-C  03/29/2023, 10:20 AM

## 2023-03-29 NOTE — Telephone Encounter (Signed)
   Pre-operative Risk Assessment    Patient Name: Jared Roberts  DOB: 1945/12/04 MRN: [id number]   DATE OF LAST VISIT: 05/04/22 VIN BHAGAT DATE OF NEXT VISIT: NONE  Request for Surgical Clearance    Procedure:   COLONOSCOPY ; H/O COLON POLYPS  Date of Surgery:  Clearance 05/07/23                                 Surgeon:  DR. MARC MAGOD Surgeon's Group or Practice Name:  [facility] Phone number:  [phone] Fax number:  [phone]   Type of Clearance Requested:   - Medical ; PLAVIX x 5 DAYS PRIOR   Type of Anesthesia:   PROPOFOL   Additional requests/questions:    [name]   03/29/2023, 10:09 AM

## 2023-03-30 ENCOUNTER — Encounter: Payer: Self-pay | Admitting: Cardiovascular Disease

## 2023-03-30 NOTE — Telephone Encounter (Signed)
1st attempt to reach pt to schedule IN OFFICE visit for preop clearance. Lvm

## 2023-04-02 NOTE — Telephone Encounter (Signed)
2nd attempt at scheduling in-office appt. lvmtrc

## 2023-04-02 NOTE — Telephone Encounter (Signed)
See MY CHART message. Pt has been scheduled for pre op clearance with Tereso Newcomer, [facility] 04/11/23 @ 1:55. I have sent a message back to the pt thru MY CHART. I will update all parties involved.

## 2023-04-05 ENCOUNTER — Other Ambulatory Visit (HOSPITAL_BASED_OUTPATIENT_CLINIC_OR_DEPARTMENT_OTHER): Payer: Self-pay

## 2023-04-05 MED ORDER — FLUAD 0.5 ML IM SUSY
PREFILLED_SYRINGE | INTRAMUSCULAR | 0 refills | Status: DC
  Filled 2023-04-05: qty 0.5, 1d supply, fill #0

## 2023-04-05 MED ORDER — COMIRNATY 30 MCG/0.3ML IM SUSY
PREFILLED_SYRINGE | INTRAMUSCULAR | 0 refills | Status: DC
  Filled 2023-04-05: qty 0.3, 1d supply, fill #0

## 2023-04-11 ENCOUNTER — Telehealth: Payer: Self-pay | Admitting: Cardiovascular Disease

## 2023-04-11 ENCOUNTER — Encounter: Payer: Self-pay | Admitting: Physician Assistant

## 2023-04-11 ENCOUNTER — Ambulatory Visit: Payer: Medicare HMO | Attending: Physician Assistant | Admitting: Physician Assistant

## 2023-04-11 VITALS — BP 110/70 | HR 74 | Ht 70.0 in | Wt 181.6 lb

## 2023-04-11 DIAGNOSIS — Z01810 Encounter for preprocedural cardiovascular examination: Secondary | ICD-10-CM | POA: Insufficient documentation

## 2023-04-11 DIAGNOSIS — E7800 Pure hypercholesterolemia, unspecified: Secondary | ICD-10-CM | POA: Diagnosis not present

## 2023-04-11 DIAGNOSIS — I10 Essential (primary) hypertension: Secondary | ICD-10-CM

## 2023-04-11 DIAGNOSIS — I2510 Atherosclerotic heart disease of native coronary artery without angina pectoris: Secondary | ICD-10-CM

## 2023-04-11 NOTE — Telephone Encounter (Signed)
Note sent to surgeon Tereso Newcomer, PA-C    04/11/2023 4:51 PM

## 2023-04-11 NOTE — Patient Instructions (Signed)
Medication Instructions:  Your physician recommends that you continue on your current medications as directed. Please refer to the Current Medication list given to you today.  *If you need a refill on your cardiac medications before your next appointment, please call your pharmacy*   Lab Work: None ordered  If you have labs (blood work) drawn today and your tests are completely normal, you will receive your results only by: MyChart Message (if you have MyChart) OR A paper copy in the mail If you have any lab test that is abnormal or we need to change your treatment, we will call you to review the results.   Testing/Procedures: None ordered   Follow-Up: At [facility], you and your health needs are our priority.  As part of our continuing mission to provide you with exceptional heart care, we have created designated Provider Care Teams.  These Care Teams include your primary Cardiologist (physician) and Advanced Practice Providers (APPs -  Physician Assistants and Nurse Practitioners) who all work together to provide you with the care you need, when you need it.  We recommend signing up for the patient portal called "MyChart".  Sign up information is provided on this After Visit Summary.  MyChart is used to connect with patients for Virtual Visits (Telemedicine).  Patients are able to view lab/test results, encounter notes, upcoming appointments, etc.  Non-urgent messages can be sent to your provider as well.   To learn more about what you can do with MyChart, go to [email or web address].    Your next appointment:   1 year(s)  Provider:   Kristeen Miss, MD     Other Instructions

## 2023-04-11 NOTE — Telephone Encounter (Signed)
Dr. Carolee Rota office is calling to inform Jared Roberts she has faxed over the labs she requested.   Please advise.

## 2023-04-11 NOTE — Progress Notes (Signed)
Cardiology Office Note:    Date:  04/11/2023  ID:  Kristine Garbe Brossart, DOB 1945/10/08, MRN [id number] PCP: Merri Brunette, MD  [facility] Providers Cardiologist:  Kristeen Miss, MD       Patient Profile:      Coronary artery disease s/p CABG 02/2019 NSTEMI c/b VF arrest 03/2019 s/p POBA to distal LCx/OM2 Recurrent NSTEMI s/p 2.25 x 16 mm DES to the mid LCx, s/p POBA to OM in 03/2019 TTE 01/10/2021: EF 60-65, inferolateral inferior and inferoseptal HK GLS -24.2, moderately reduced RVSF, mild RAE, trivial MR, trivial AI, AV sclerosis, mildly dilated ascending aorta (39 mm), RAP 3 LHC 08/31/2021: LIMA-LAD, SVG-PDA patent; SVG-OM known to be occluded; mid LCx stent patent Carotid artery disease Carotid US 08/18/2021: Bilateral ICA 1-39 Chronic kidney disease Dilated aortic root Hypertension Hyperlipidemia Prostate CA PVCs Monitor 11/2020: Rare PVCs, PVCs; few short bursts of NSVT (4 beats)        History of Present Illness:  Discussed the use of AI scribe software for clinical note transcription with the patient, who gave verbal consent to proceed.    Jared Roberts is a 77 y.o. male with a history of coronary artery disease, carotid artery disease, chronic kidney disease, dilated aortic root, hypertension, hyperlipidemia, and PVCs, presents for surgical clearance for a colonoscopy. He needs a colonoscopy 05/07/2023 with Dr. Ewing Schlein.  Request is to hold Plavix 5 days prior.  He is here alone. He reports feeling tired after strenuous activity but denies chest pain, shortness of breath, orthopnea, leg edema, syncope.      ROS: See HPI. No claudication.    Studies Reviewed:   EKG Interpretation Date/Time:  Wednesday April 11 2023 14:01:09 EDT Ventricular Rate:  74 PR Interval:  148 QRS Duration:  76 QT Interval:  390 QTC Calculation: 432 R Axis:   17  Text Interpretation: Normal sinus rhythm No significant change since last tracing Confirmed by Tereso Newcomer [phone]) on 04/11/2023  2:09:36 PM    Risk Assessment/Calculations:             Physical Exam:   VS:  BP 110/70   Pulse 74   Ht 5\' 10"  (1.778 m)   Wt 181 lb 9.6 oz (82.4 kg)   SpO2 96%   BMI 26.06 kg/m    Wt Readings from Last 3 Encounters:  04/11/23 181 lb 9.6 oz (82.4 kg)  05/04/22 178 lb (80.7 kg)  11/19/21 176 lb (79.8 kg)    Constitutional:      Appearance: Healthy appearance. Not in distress.  Neck:     Vascular: No carotid bruit or JVR. JVD normal.  Pulmonary:     Breath sounds: Normal breath sounds. No wheezing. No rales.  Cardiovascular:     Normal rate. Regular rhythm.     Murmurs: There is no murmur.  Edema:    Peripheral edema absent.  Abdominal:     Palpations: Abdomen is soft.        Assessment and Plan:     Coronary Artery Disease History of CABG in 2020.  He had graft failure and a non-STEMI complicated by V-fib arrest in September 2020 treated with angioplasty to the distal LCx/OM 2.  He had a recurrent non-STEMI in September 2020 treated with a DES to the mid LCx and balloon angioplasty to the OM.  Cardiac catheterization February 2023 demonstrated patent LIMA-LAD, SVG-PDA and mid LCx stent.  SVG-OM is known to be occluded.  He is currently stable without chest symptoms suggestive  of angina.  -Continue current medications:Lipitor 20mg  daily, Plavix 75mg  daily, Isosorbide mononitrate 30mg  daily, nitroglycerin as needed. -Follow up in one year.  Hypertension Controlled. -Continue current medications:Hydrochlorothiazide 25mg  daily, Isosorbide mononitrate 30mg  daily, Losartan 100mg  daily, Metoprolol tartrate 50mg  twice daily.  Hyperlipidemia Last lipid panel in April 2023 showed elevated triglycerides at 243, optimal LDL at 51. -Continue current medications:Lipitor 20mg  daily, Fish oil 1 capsule daily. -Request most recent lipid panel from primary care.  Surgical Clearance for Colonoscopy Revised cardiac risk index score is 1 with a perioperative risk of major cardiac event  at 0.9%.  He can achieve more than four METs.  He is currently not having any unstable cardiac conditions.  Ideally, we would recommend transitioning from Plavix to aspirin while Plavix is being held for procedure.  However, if the bleeding risk is too great, he can forego transition to aspirin and hold Plavix 5 days prior to his procedure. -He may proceed with colonoscopy at acceptable CV risk -If bleeding risk is acceptable, switch Plavix to aspirin 81 mg daily and switch back to Plavix after procedure -If bleeding risk is too great, hold Plavix 5 days prior to procedure and resume postop when safe        Dispo:  Return in about 1 year (around 04/10/2024) for Routine Follow Up, w/ Dr. Elease Hashimoto.  Signed, Tereso Newcomer, PA-C

## 2023-04-13 DIAGNOSIS — H5213 Myopia, bilateral: Secondary | ICD-10-CM | POA: Diagnosis not present

## 2023-04-13 DIAGNOSIS — H2513 Age-related nuclear cataract, bilateral: Secondary | ICD-10-CM | POA: Diagnosis not present

## 2023-04-13 NOTE — Telephone Encounter (Signed)
Received pt's labs and put them in [place], PA-C's box for review.

## 2023-04-22 ENCOUNTER — Other Ambulatory Visit: Payer: Self-pay | Admitting: Cardiovascular Disease

## 2023-04-24 ENCOUNTER — Other Ambulatory Visit: Payer: Self-pay

## 2023-04-24 MED ORDER — ATORVASTATIN CALCIUM 20 MG PO TABS: 20.0000 mg | ORAL_TABLET | Freq: Every day | ORAL | 3 refills | Status: DC

## 2023-05-04 ENCOUNTER — Other Ambulatory Visit: Payer: Self-pay | Admitting: Cardiovascular Disease

## 2023-05-04 MED ORDER — METOPROLOL TARTRATE 50 MG PO TABS: 50.0000 mg | ORAL_TABLET | Freq: Two times a day (BID) | ORAL | 0 refills | Status: DC

## 2023-05-07 DIAGNOSIS — K5730 Diverticulosis of large intestine without perforation or abscess without bleeding: Secondary | ICD-10-CM | POA: Diagnosis not present

## 2023-05-07 DIAGNOSIS — Z860100 Personal history of colon polyps, unspecified: Secondary | ICD-10-CM | POA: Diagnosis not present

## 2023-05-07 DIAGNOSIS — D123 Benign neoplasm of transverse colon: Secondary | ICD-10-CM | POA: Diagnosis not present

## 2023-05-07 DIAGNOSIS — Z09 Encounter for follow-up examination after completed treatment for conditions other than malignant neoplasm: Secondary | ICD-10-CM | POA: Diagnosis not present

## 2023-05-09 ENCOUNTER — Other Ambulatory Visit: Payer: Self-pay

## 2023-05-09 MED ORDER — METOPROLOL TARTRATE 50 MG PO TABS: 50.0000 mg | ORAL_TABLET | Freq: Two times a day (BID) | ORAL | 3 refills | Status: DC

## 2023-05-17 DIAGNOSIS — H01005 Unspecified blepharitis left lower eyelid: Secondary | ICD-10-CM | POA: Diagnosis not present

## 2023-05-24 ENCOUNTER — Other Ambulatory Visit: Payer: Self-pay | Admitting: Cardiovascular Disease

## 2023-06-08 ENCOUNTER — Other Ambulatory Visit: Payer: Self-pay | Admitting: Cardiovascular Disease

## 2023-06-19 ENCOUNTER — Other Ambulatory Visit: Payer: Self-pay | Admitting: Cardiovascular Disease

## 2023-06-22 ENCOUNTER — Other Ambulatory Visit (HOSPITAL_BASED_OUTPATIENT_CLINIC_OR_DEPARTMENT_OTHER): Payer: Self-pay

## 2023-06-22 MED ORDER — AREXVY 120 MCG/0.5ML IM SUSR
0.5000 mL | Freq: Once | INTRAMUSCULAR | 0 refills | Status: AC
  Filled 2023-06-22: qty 0.5, 1d supply, fill #0

## 2023-07-15 ENCOUNTER — Encounter: Payer: Self-pay | Admitting: Cardiovascular Disease

## 2023-07-16 MED ORDER — LOSARTAN POTASSIUM 100 MG PO TABS: 100.0000 mg | ORAL_TABLET | Freq: Every day | ORAL | 2 refills | Status: DC

## 2023-07-22 ENCOUNTER — Encounter: Payer: Self-pay | Admitting: Cardiovascular Disease

## 2023-07-23 MED ORDER — METOPROLOL TARTRATE 50 MG PO TABS: 50.0000 mg | ORAL_TABLET | Freq: Two times a day (BID) | ORAL | 3 refills | Status: AC

## 2023-07-23 NOTE — Telephone Encounter (Signed)
Pt just seen 04/11/23 by Tereso Newcomer: Hypertension Controlled. -Continue current medications:Hydrochlorothiazide 25mg  daily, Isosorbide mononitrate 30mg  daily, Losartan 100mg  daily, Metoprolol tartrate 50mg  twice daily.  Refill sent pharmacy at this time for 1 year. Pt was instructed to return for OV at that time.

## 2023-08-14 DIAGNOSIS — K080 Exfoliation of teeth due to systemic causes: Secondary | ICD-10-CM | POA: Diagnosis not present

## 2023-10-05 ENCOUNTER — Other Ambulatory Visit (HOSPITAL_BASED_OUTPATIENT_CLINIC_OR_DEPARTMENT_OTHER): Payer: Self-pay

## 2023-10-05 MED ORDER — COVID-19 MRNA VAC-TRIS(PFIZER) 30 MCG/0.3ML IM SUSY
0.3000 mL | PREFILLED_SYRINGE | Freq: Once | INTRAMUSCULAR | 0 refills | Status: AC
  Filled 2023-10-05: qty 0.3, 1d supply, fill #0

## 2023-10-11 DIAGNOSIS — L578 Other skin changes due to chronic exposure to nonionizing radiation: Secondary | ICD-10-CM | POA: Diagnosis not present

## 2023-10-11 DIAGNOSIS — D2261 Melanocytic nevi of right upper limb, including shoulder: Secondary | ICD-10-CM | POA: Diagnosis not present

## 2023-10-11 DIAGNOSIS — D2371 Other benign neoplasm of skin of right lower limb, including hip: Secondary | ICD-10-CM | POA: Diagnosis not present

## 2023-10-11 DIAGNOSIS — D485 Neoplasm of uncertain behavior of skin: Secondary | ICD-10-CM | POA: Diagnosis not present

## 2023-10-11 DIAGNOSIS — L820 Inflamed seborrheic keratosis: Secondary | ICD-10-CM | POA: Diagnosis not present

## 2023-10-11 DIAGNOSIS — L570 Actinic keratosis: Secondary | ICD-10-CM | POA: Diagnosis not present

## 2023-11-19 DIAGNOSIS — Z125 Encounter for screening for malignant neoplasm of prostate: Secondary | ICD-10-CM | POA: Diagnosis not present

## 2023-11-19 DIAGNOSIS — I10 Essential (primary) hypertension: Secondary | ICD-10-CM | POA: Diagnosis not present

## 2023-11-26 DIAGNOSIS — K219 Gastro-esophageal reflux disease without esophagitis: Secondary | ICD-10-CM | POA: Diagnosis not present

## 2023-11-26 DIAGNOSIS — R809 Proteinuria, unspecified: Secondary | ICD-10-CM | POA: Diagnosis not present

## 2023-11-26 DIAGNOSIS — Z0000 Encounter for general adult medical examination without abnormal findings: Secondary | ICD-10-CM | POA: Diagnosis not present

## 2023-11-26 DIAGNOSIS — M8580 Other specified disorders of bone density and structure, unspecified site: Secondary | ICD-10-CM | POA: Diagnosis not present

## 2023-11-26 DIAGNOSIS — I25718 Atherosclerosis of autologous vein coronary artery bypass graft(s) with other forms of angina pectoris: Secondary | ICD-10-CM | POA: Diagnosis not present

## 2023-11-30 DIAGNOSIS — L820 Inflamed seborrheic keratosis: Secondary | ICD-10-CM | POA: Diagnosis not present

## 2023-12-06 DIAGNOSIS — I10 Essential (primary) hypertension: Secondary | ICD-10-CM | POA: Diagnosis not present

## 2023-12-06 DIAGNOSIS — I252 Old myocardial infarction: Secondary | ICD-10-CM | POA: Diagnosis not present

## 2023-12-06 DIAGNOSIS — N182 Chronic kidney disease, stage 2 (mild): Secondary | ICD-10-CM | POA: Diagnosis not present

## 2023-12-10 ENCOUNTER — Encounter: Payer: Self-pay | Admitting: Cardiovascular Disease

## 2023-12-10 NOTE — Telephone Encounter (Signed)
 Please schedule pt

## 2023-12-18 NOTE — Progress Notes (Signed)
  Cardiology Office Note:  .   Date:  12/31/2023  ID:  Christ Courier Medici, DOB Feb 25, 1946, MRN [id number] PCP: Imelda Man, MD  [facility] Providers Cardiologist:  Ahmad Alert, MD    History of Present Illness: .   Jared Roberts is a 78 y.o. male  with a history of coronary artery disease, carotid artery disease, chronic kidney disease, dilated aortic root, hypertension, hyperlipidemia, and PVCs  Patient added onto my schedule for chest pain, lack of energy, tired and increased napping. Chest pain same area as GERD. Patient here with his wife. When he made the appt he was feeling terrible. Was taking too much ibuprofen and is now feeling better. His angina occurs when he's most relaxed. Aching in left chest into center of chest eases immediately with NTG SL. Doesn't notice it with exertion. He does yard work, house work, stairs without chest pain. He checks his Kardia mobile and sometimes HR 47-60-during nap time when he has the chest pain.  ROS:    Studies Reviewed: Aaron Aas         Prior CV Studies:   TTE 01/10/2021: EF 60-65, inferolateral inferior and inferoseptal HK GLS -24.2, moderately reduced RVSF, mild RAE, trivial MR, trivial AI, AV sclerosis, mildly dilated ascending aorta (39 mm), RAP 3 LHC 08/31/2021: LIMA-LAD, SVG-PDA patent; SVG-OM known to be occluded; mid LCx stent patent   Risk Assessment/Calculations:             Physical Exam:   VS:  BP 109/60   Pulse 74   Ht 5\' 10"  (1.778 m)   Wt 181 lb (82.1 kg)   SpO2 97%   BMI 25.97 kg/m    Wt Readings from Last 3 Encounters:  12/31/23 181 lb (82.1 kg)  04/11/23 181 lb 9.6 oz (82.4 kg)  05/04/22 178 lb (80.7 kg)    GEN: Well nourished, well developed in no acute distress NECK: No JVD; No carotid bruits CARDIAC:  RRR, no murmurs, rubs, gallops RESPIRATORY:  Clear to auscultation without rales, wheezing or rhonchi  ABDOMEN: Soft, non-tender, non-distended EXTREMITIES:  No edema; No deformity   ASSESSMENT AND PLAN: .     Coronary Artery Disease History of CABG in 2020.  He had graft failure and a non-STEMI complicated by V-fib arrest in September 2020 treated with angioplasty to the distal LCx/OM 2.  He had a recurrent non-STEMI in September 2020 treated with a DES to the mid LCx and balloon angioplasty to the OM.  Cardiac catheterization February 2023 demonstrated patent LIMA-LAD, SVG-PDA and mid LCx stent.  SVG-OM is known to be occluded.   He is currently having chest pain at rest relieved with NTG SL. -Continue current medications:Lipitor 20mg  daily, Plavix  75mg  daily, I Increase isosorbide  mononitrate 30mg  bid, nitroglycerin  as needed. -discussed stress testing but patient wants to try above changes first.   Hypertension On the low side today -decrease Losartan  50 mg daily,     Hyperlipidemia LDL 68 10/2023  Carotid disease 07/2021 1-39%   Dilated aortic root 39 mm 2022          Dispo: f/u Dr. Cooper Denver to establish in 3-4 months.  Signed, Theotis Flake, PA-C

## 2023-12-31 ENCOUNTER — Ambulatory Visit: Payer: Self-pay | Attending: Physician Assistant | Admitting: Physician Assistant

## 2023-12-31 ENCOUNTER — Encounter: Payer: Self-pay | Admitting: Physician Assistant

## 2023-12-31 VITALS — BP 109/60 | HR 74 | Ht 70.0 in | Wt 181.0 lb

## 2023-12-31 DIAGNOSIS — E785 Hyperlipidemia, unspecified: Secondary | ICD-10-CM

## 2023-12-31 DIAGNOSIS — I200 Unstable angina: Secondary | ICD-10-CM

## 2023-12-31 DIAGNOSIS — E7800 Pure hypercholesterolemia, unspecified: Secondary | ICD-10-CM | POA: Diagnosis not present

## 2023-12-31 DIAGNOSIS — I4901 Ventricular fibrillation: Secondary | ICD-10-CM

## 2023-12-31 DIAGNOSIS — I25118 Atherosclerotic heart disease of native coronary artery with other forms of angina pectoris: Secondary | ICD-10-CM | POA: Diagnosis not present

## 2023-12-31 DIAGNOSIS — I2510 Atherosclerotic heart disease of native coronary artery without angina pectoris: Secondary | ICD-10-CM

## 2023-12-31 DIAGNOSIS — I10 Essential (primary) hypertension: Secondary | ICD-10-CM | POA: Diagnosis not present

## 2023-12-31 MED ORDER — LOSARTAN POTASSIUM 50 MG PO TABS
50.0000 mg | ORAL_TABLET | Freq: Every day | ORAL | 3 refills | Status: DC
Start: 2023-12-31 — End: 2024-03-03

## 2023-12-31 MED ORDER — ISOSORBIDE MONONITRATE ER 30 MG PO TB24: 30.0000 mg | ORAL_TABLET | Freq: Two times a day (BID) | ORAL | 3 refills | Status: AC

## 2023-12-31 NOTE — Telephone Encounter (Signed)
 Scheduled for appt 12/31/23 w/Michelle Havery Lions, PA.

## 2023-12-31 NOTE — Telephone Encounter (Signed)
 Labs for review

## 2023-12-31 NOTE — Patient Instructions (Signed)
 Medication Instructions:  DECREASE LOSARTAN  TO 50 MG DAILY. INCREASE YOUR ISOSORBIDE  TO 30 MG TWICE DAILY.   Lab Work: NONE   Testing/Procedures: NONE  Follow-Up: At [facility], you and your health needs are our priority.  As part of our continuing mission to provide you with exceptional heart care, our providers are all part of one team.  This team includes your primary Cardiologist (physician) and Advanced Practice Providers or APPs (Physician Assistants and Nurse Practitioners) who all work together to provide you with the care you need, when you need it.  Your next appointment:   3-4 MONTHS AS NEW PATIENT WITH DR. O'NEAL.  Provider:   DR. Rolm Clos

## 2024-02-14 DIAGNOSIS — K080 Exfoliation of teeth due to systemic causes: Secondary | ICD-10-CM | POA: Diagnosis not present

## 2024-02-28 DIAGNOSIS — N182 Chronic kidney disease, stage 2 (mild): Secondary | ICD-10-CM | POA: Diagnosis not present

## 2024-02-28 DIAGNOSIS — M8000XD Age-related osteoporosis with current pathological fracture, unspecified site, subsequent encounter for fracture with routine healing: Secondary | ICD-10-CM | POA: Diagnosis not present

## 2024-02-28 DIAGNOSIS — M810 Age-related osteoporosis without current pathological fracture: Secondary | ICD-10-CM | POA: Diagnosis not present

## 2024-03-03 DIAGNOSIS — I209 Angina pectoris, unspecified: Secondary | ICD-10-CM

## 2024-03-03 MED ORDER — LOSARTAN POTASSIUM 100 MG PO TABS: 100.0000 mg | ORAL_TABLET | Freq: Every day | ORAL | 3 refills | Status: DC

## 2024-03-03 NOTE — Telephone Encounter (Signed)
 Parthenia Olivia HERO, PA-C to [facility] Triage (Selected Message)     03/03/24 12:49 PM Angina shouldn't be felt constantly. Can we get him in sooner with Dr. Barbaraann. We could also order a nuclear stress test if he's agreeable? Let me know thanks. I'm ok if he went back up on losartan  as long as he keeps check of his BP. Olivia

## 2024-03-10 ENCOUNTER — Emergency Department (HOSPITAL_BASED_OUTPATIENT_CLINIC_OR_DEPARTMENT_OTHER)
Admission: EM | Admit: 2024-03-10 | Discharge: 2024-03-11 | Disposition: A | Discharge status: Home / Self Care | Attending: Emergency Medicine | Admitting: Emergency Medicine

## 2024-03-10 ENCOUNTER — Encounter (HOSPITAL_BASED_OUTPATIENT_CLINIC_OR_DEPARTMENT_OTHER): Payer: Self-pay | Admitting: Emergency Medicine

## 2024-03-10 ENCOUNTER — Emergency Department (HOSPITAL_BASED_OUTPATIENT_CLINIC_OR_DEPARTMENT_OTHER): Admitting: Radiology

## 2024-03-10 DIAGNOSIS — I2510 Atherosclerotic heart disease of native coronary artery without angina pectoris: Secondary | ICD-10-CM | POA: Insufficient documentation

## 2024-03-10 DIAGNOSIS — R002 Palpitations: Secondary | ICD-10-CM | POA: Diagnosis not present

## 2024-03-10 DIAGNOSIS — Z79899 Other long term (current) drug therapy: Secondary | ICD-10-CM | POA: Insufficient documentation

## 2024-03-10 DIAGNOSIS — I160 Hypertensive urgency: Secondary | ICD-10-CM | POA: Diagnosis not present

## 2024-03-10 DIAGNOSIS — Z951 Presence of aortocoronary bypass graft: Secondary | ICD-10-CM | POA: Insufficient documentation

## 2024-03-10 DIAGNOSIS — I10 Essential (primary) hypertension: Secondary | ICD-10-CM | POA: Diagnosis not present

## 2024-03-10 DIAGNOSIS — R079 Chest pain, unspecified: Secondary | ICD-10-CM | POA: Diagnosis not present

## 2024-03-10 LAB — CBC
HCT: 45.1 % (ref 39.0–52.0)
Hemoglobin: 16.3 g/dL (ref 13.0–17.0)
MCH: 31.8 pg (ref 26.0–34.0)
MCHC: 36.1 g/dL — ABNORMAL HIGH (ref 30.0–36.0)
MCV: 88.1 fL (ref 80.0–100.0)
Platelets: 182 K/uL (ref 150–400)
RBC: 5.12 MIL/uL (ref 4.22–5.81)
RDW: 12.4 % (ref 11.5–15.5)
WBC: 8.1 K/uL (ref 4.0–10.5)
nRBC: 0 % (ref 0.0–0.2)

## 2024-03-10 LAB — BASIC METABOLIC PANEL WITH GFR
Anion gap: 14 (ref 5–15)
BUN: 17 mg/dL (ref 8–23)
CO2: 20 mmol/L — ABNORMAL LOW (ref 22–32)
Calcium: 9.9 mg/dL (ref 8.9–10.3)
Chloride: 105 mmol/L (ref 98–111)
Creatinine, Ser: 1.15 mg/dL (ref 0.61–1.24)
GFR, Estimated: >60 mL/min (ref >60)
Glucose, Bld: 136 mg/dL — ABNORMAL HIGH (ref 70–99)
Potassium: 3.6 mmol/L (ref 3.5–5.1)
Sodium: 139 mmol/L (ref 135–145)

## 2024-03-10 LAB — TROPONIN T, HIGH SENSITIVITY: Troponin T High Sensitivity: <15 ng/L (ref 0–19)

## 2024-03-10 NOTE — ED Triage Notes (Signed)
 Some heart palpitation Noted high BP today Took extra losartan  and propanolol and nitro Denies chest pain   Patient Most concerned with BP

## 2024-03-10 NOTE — ED Provider Notes (Signed)
 [place] EMERGENCY DEPARTMENT AT [facility] Provider Note   CSN: [id number] Arrival date & time: 03/10/24  2059     Patient presents with: Palpitations   Jared Roberts is a 78 y.o. male.  {Add pertinent medical, surgical, social history, OB history to HPI:32947} Patient is an 78 year old male with past medical history of coronary artery disease status post CABG 5 years ago, hypertension.  Patient presenting today for evaluation of elevated blood pressure and increased heart rate.  He noticed this earlier today while checking his blood pressure.  He got readings as high as 158 systolic and heart rate was in the upper 90s which was abnormal for him.  He denies to me he is having any chest pain or shortness of breath.  No fevers or chills.  No other complaints.       Prior to Admission medications   Medication Sig Start Date End Date Taking? Authorizing Provider  ALPHA LIPOIC ACID PO Take 1 tablet by mouth daily.    [provider]  Ascorbic Acid (VITAMIN C PO) Take 1 tablet by mouth daily.    [provider]  atorvastatin  (LIPITOR) 20 MG tablet Take 1 tablet (20 mg total) by mouth daily. 04/24/23   Nahser, Aleene PARAS, MD  calcium  carbonate (TUMS - DOSED IN MG ELEMENTAL CALCIUM ) 500 MG chewable tablet Chew 2 tablets by mouth as needed for indigestion or heartburn.     [provider]  Cholecalciferol (VITAMIN D ) 2000 units CAPS Take 2,000 Units by mouth 2 (two) times a day.     [provider]  CHROMIUM PO Take by mouth daily.    [provider]  clopidogrel  (PLAVIX ) 75 MG tablet TAKE 1 TABLET BY MOUTH DAILY 05/24/23   Nahser, Aleene PARAS, MD  Fish Oil-Cholecalciferol (FISH OIL + D3 PO) Take 1 capsule by mouth daily.    [provider]  hydrochlorothiazide  (HYDRODIURIL ) 25 MG tablet TAKE 1 TABLET BY MOUTH DAILY 06/19/23   Nahser, Aleene PARAS, MD  isosorbide  mononitrate (IMDUR ) 30 MG 24 hr tablet Take 1 tablet (30 mg total) by mouth in  the morning and at bedtime. 12/31/23   Parthenia Olivia HERO, PA-C  losartan  (COZAAR ) 100 MG tablet Take 1 tablet (100 mg total) by mouth daily. 03/03/24   Parthenia Olivia HERO, PA-C  MAGNESIUM  PO Take 300 mg by mouth daily.    [provider]  metoprolol  tartrate (LOPRESSOR ) 50 MG tablet Take 1 tablet (50 mg total) by mouth 2 (two) times daily. 07/23/23   Nahser, Aleene PARAS, MD  Multiple Vitamin (MULTIVITAMIN WITH MINERALS) TABS tablet Take 1 tablet by mouth daily.    [provider]  nitroGLYCERIN  (NITROSTAT ) 0.4 MG SL tablet DISSOLVE 1 TAB UNDER TONGUE FOR CHEST PAIN - IF PAIN REMAINS AFTER 5 MIN, CALL 911 AND REPEAT DOSE. MAX 3 TABS IN 15 MINUTES 08/16/20   Nahser, Aleene PARAS, MD  pantoprazole  (PROTONIX ) 20 MG tablet Take 20 mg by mouth every other day. 03/07/22   [provider]  potassium chloride  (KLOR-CON ) 10 MEQ tablet TAKE 1 TABLET BY MOUTH DAILY 04/24/23   Nahser, Aleene PARAS, MD  propranolol  (INDERAL ) 10 MG tablet Take 10 mg by mouth as needed (for rapid heatbeat). 06/07/22   [provider]    Allergies: Patient has no known allergies.    Review of Systems  All other systems reviewed and are negative.   Updated Vital Signs BP 114/75   Pulse 72   Temp 98.7 F (  37.1 C)   Resp 19   SpO2 90%   Physical Exam Vitals and nursing note reviewed.  Constitutional:      General: He is not in acute distress.    Appearance: He is well-developed. He is not diaphoretic.  HENT:     Head: Normocephalic and atraumatic.  Cardiovascular:     Rate and Rhythm: Normal rate and regular rhythm.     Heart sounds: No murmur heard.    No friction rub.  Pulmonary:     Effort: Pulmonary effort is normal. No respiratory distress.     Breath sounds: Normal breath sounds. No wheezing or rales.  Abdominal:     General: Bowel sounds are normal. There is no distension.     Palpations: Abdomen is soft.     Tenderness: There is no abdominal tenderness.  Musculoskeletal:         General: Normal range of motion.     Cervical back: Normal range of motion and neck supple.  Skin:    General: Skin is warm and dry.  Neurological:     Mental Status: He is alert and oriented to person, place, and time.     Coordination: Coordination normal.     (all labs ordered are listed, but only abnormal results are displayed) Labs Reviewed  BASIC METABOLIC PANEL WITH GFR - Abnormal; Notable for the following components:      Result Value   CO2 20 (*)    Glucose, Bld 136 (*)    All other components within normal limits  CBC - Abnormal; Notable for the following components:   MCHC 36.1 (*)    All other components within normal limits  TROPONIN T, HIGH SENSITIVITY  TROPONIN T, HIGH SENSITIVITY    EKG: EKG Interpretation Date/Time:  Monday March 10 2024 21:11:12 EDT Ventricular Rate:  99 PR Interval:  147 QRS Duration:  91 QT Interval:  335 QTC Calculation: 430 R Axis:   78  Text Interpretation: Sinus rhythm No significant change since 12/31/2023 Confirmed by Geroldine Berg ([id number]) on 03/10/2024 11:04:28 PM  Radiology: [facility] Chest 2 View Result Date: 03/10/2024 CLINICAL DATA:  Chest pain.  Palpitations.  Coronary artery disease. EXAM: CHEST - 2 VIEW COMPARISON:  08/10/2021 FINDINGS: The heart size and mediastinal contours are within normal limits. Prior CABG again noted. Both lungs are clear. The visualized skeletal structures are unremarkable. IMPRESSION: No active cardiopulmonary disease. Electronically Signed   By: Norleen DELENA Kil M.D.   On: 03/10/2024 21:31    {Document cardiac monitor, telemetry assessment procedure when appropriate:32947} Procedures   Medications Ordered in the ED - No data to display    {Click here for ABCD2, HEART and other calculators REFRESH Note before signing:1}                              Medical Decision Making Amount and/or Complexity of Data Reviewed Labs: ordered. Radiology: ordered.   ***  {Document critical care time when  appropriate  Document review of labs and clinical decision tools ie CHADS2VASC2, etc  Document your independent review of radiology images and any outside records  Document your discussion with family members, caretakers and with consultants  Document social determinants of health affecting pt's care  Document your decision making why or why not admission, treatments were needed:32947:::1}   Final diagnoses:  None    ED Discharge Orders     None

## 2024-03-10 NOTE — ED Notes (Signed)
 Pt states he has had palpitations, denies CP BP has been high today and he has taken extra meds

## 2024-03-11 LAB — TROPONIN T, HIGH SENSITIVITY: Troponin T High Sensitivity: <15 ng/L (ref 0–19)

## 2024-03-11 NOTE — Discharge Instructions (Signed)
 Keep a record of your blood pressures and follow-up with your primary doctor later this week.  Return to the ER if your symptoms significantly worsen or change.

## 2024-03-12 ENCOUNTER — Other Ambulatory Visit: Payer: Self-pay | Admitting: Cardiovascular Disease

## 2024-03-12 DIAGNOSIS — R0602 Shortness of breath: Secondary | ICD-10-CM

## 2024-03-12 NOTE — Progress Notes (Signed)
 Stress consent placed.   Signed, Darryle DASEN. Barbaraann, MD, [facility]  [facility]  [address] [place], [place] [address] [phone]  8:11 PM

## 2024-03-12 NOTE — Telephone Encounter (Signed)
 Called the patient and offered the patient an appointment today with an APP. He said that he plans to work with Dr Clarice on his BP/to get it straightened out.   He would just like us  to get him scheduled for the Nuclear Stress test.  Informed him that I have placed an order for stress test and he will be contacted by our scheduling team to set it up.   He has a f/u visit in October with Dr Barbaraann. After his results from Nuclear stress test, we can see if a sooner f/u visit is needed.  He feels he may have another blockage- may need a Coronary CT. Informed him that most times a stress test is first, due to insurance purposes- then if needed- move onto a Cardiac CT.   He verbalized understanding.   Will forward this to his provider.

## 2024-03-13 ENCOUNTER — Encounter (HOSPITAL_COMMUNITY): Payer: Self-pay | Admitting: Physician Assistant

## 2024-03-14 ENCOUNTER — Other Ambulatory Visit: Payer: Self-pay | Admitting: Physician Assistant

## 2024-03-14 ENCOUNTER — Telehealth (HOSPITAL_COMMUNITY): Payer: Self-pay | Admitting: *Deleted

## 2024-03-14 DIAGNOSIS — I209 Angina pectoris, unspecified: Secondary | ICD-10-CM

## 2024-03-14 NOTE — Telephone Encounter (Signed)

## 2024-03-17 ENCOUNTER — Ambulatory Visit (HOSPITAL_COMMUNITY)
Admission: RE | Admit: 2024-03-17 | Discharge: 2024-03-17 | Disposition: A | Discharge status: Home / Self Care | Source: Ambulatory Visit | Attending: Physician Assistant | Admitting: Physician Assistant

## 2024-03-17 ENCOUNTER — Other Ambulatory Visit: Payer: Self-pay

## 2024-03-17 DIAGNOSIS — I209 Angina pectoris, unspecified: Secondary | ICD-10-CM | POA: Diagnosis not present

## 2024-03-17 MED ORDER — TECHNETIUM TC 99M TETROFOSMIN IV KIT
31.5000 | PACK | Freq: Once | INTRAVENOUS | Status: AC | PRN
  Administered 2024-03-17: 31.5 via INTRAVENOUS

## 2024-03-17 MED ORDER — REGADENOSON 0.4 MG/5ML IV SOLN
INTRAVENOUS | Status: AC
  Filled 2024-03-17: qty 5

## 2024-03-17 MED ORDER — TECHNETIUM TC 99M TETROFOSMIN IV KIT
10.9000 | PACK | Freq: Once | INTRAVENOUS | Status: AC | PRN
  Administered 2024-03-17: 10.9 via INTRAVENOUS

## 2024-03-17 MED ORDER — REGADENOSON 0.4 MG/5ML IV SOLN
0.4000 mg | Freq: Once | INTRAVENOUS | Status: AC
  Administered 2024-03-17: 0.4 mg via INTRAVENOUS

## 2024-03-18 ENCOUNTER — Ambulatory Visit: Payer: Self-pay | Admitting: Physician Assistant

## 2024-03-18 DIAGNOSIS — L299 Pruritus, unspecified: Secondary | ICD-10-CM | POA: Diagnosis not present

## 2024-03-18 DIAGNOSIS — J392 Other diseases of pharynx: Secondary | ICD-10-CM | POA: Diagnosis not present

## 2024-03-18 DIAGNOSIS — K219 Gastro-esophageal reflux disease without esophagitis: Secondary | ICD-10-CM | POA: Diagnosis not present

## 2024-03-18 LAB — MYOCARDIAL PERFUSION IMAGING
LV dias vol: 70 mL (ref 62–150)
LV sys vol: 32 mL (ref 4.2–5.8)
Nuc Stress EF: 54 %
Peak HR: 83 {beats}/min
Rest HR: 65 {beats}/min
Rest Nuclear Isotope Dose: 10.9 mCi
SDS: 0
SRS: 6
SSS: 3
ST Depression (mm): 0 mm
Stress Nuclear Isotope Dose: 31.5 mCi
TID: 0.91

## 2024-03-19 MED ORDER — HYDROCHLOROTHIAZIDE 25 MG PO TABS: 25.0000 mg | ORAL_TABLET | Freq: Every day | ORAL | 3 refills | Status: AC

## 2024-03-21 ENCOUNTER — Ambulatory Visit: Payer: Self-pay | Admitting: Physician Assistant

## 2024-04-08 DIAGNOSIS — L821 Other seborrheic keratosis: Secondary | ICD-10-CM | POA: Diagnosis not present

## 2024-04-08 DIAGNOSIS — Z85820 Personal history of malignant melanoma of skin: Secondary | ICD-10-CM | POA: Diagnosis not present

## 2024-04-08 DIAGNOSIS — L570 Actinic keratosis: Secondary | ICD-10-CM | POA: Diagnosis not present

## 2024-04-08 DIAGNOSIS — D2371 Other benign neoplasm of skin of right lower limb, including hip: Secondary | ICD-10-CM | POA: Diagnosis not present

## 2024-04-08 DIAGNOSIS — L578 Other skin changes due to chronic exposure to nonionizing radiation: Secondary | ICD-10-CM | POA: Diagnosis not present

## 2024-04-14 ENCOUNTER — Other Ambulatory Visit: Payer: Self-pay

## 2024-04-14 DIAGNOSIS — H5213 Myopia, bilateral: Secondary | ICD-10-CM | POA: Diagnosis not present

## 2024-04-14 DIAGNOSIS — I6523 Occlusion and stenosis of bilateral carotid arteries: Secondary | ICD-10-CM

## 2024-04-15 MED ORDER — POTASSIUM CHLORIDE ER 10 MEQ PO TBCR: 10.0000 meq | EXTENDED_RELEASE_TABLET | Freq: Every day | ORAL | 2 refills | Status: AC

## 2024-04-16 ENCOUNTER — Other Ambulatory Visit: Payer: Self-pay

## 2024-04-16 MED ORDER — ATORVASTATIN CALCIUM 20 MG PO TABS: 20.0000 mg | ORAL_TABLET | Freq: Every day | ORAL | 3 refills | Status: AC

## 2024-04-22 DIAGNOSIS — R9430 Abnormal result of cardiovascular function study, unspecified: Secondary | ICD-10-CM | POA: Diagnosis not present

## 2024-04-22 DIAGNOSIS — I10 Essential (primary) hypertension: Secondary | ICD-10-CM | POA: Diagnosis not present

## 2024-04-24 NOTE — Progress Notes (Unsigned)
 Cardiology Office Note:  .   Date:  04/25/2024  ID:  Jared Roberts, DOB [birth year], MRN [id number] PCP: Clarice Nottingham, MD  [facility] Providers Cardiologist:  Aleene Passe, MD (Inactive) { History of Present Illness: .    Chief Complaint  Patient presents with   Follow-up    Jared Roberts is a 78 y.o. male with history of cad s/p CABG, HTN, HLD who presents for follow-up.    History of Present Illness   Jared Roberts is an 78 year old male with coronary artery disease status post CABG who presents for follow-up of hypertension and hyperlipidemia. He is accompanied by his wife. He was previously under the care of Dr. Robby.  He has a history of coronary artery disease and underwent coronary artery bypass grafting (CABG) in 2020. He experienced a cardiac arrest due to the occlusion of a vein graft to LCX, leading to a myocardial infarction (MI) several weeks after CABG. No implantable cardioverter-defibrillator (ICD) was recommended at that time. He is concerned about the potential for stroke and is vigilant about his blood pressure management. He is currently on Plavix , Imdur , and a statin for his cardiovascular health.  Hypertension has been difficult to manage. He was initially on losartan  100 mg daily, which was reduced to 50 mg. Subsequently, he switched to olmesartan  40 mg daily (taken as 20 mg twice daily) due to its longer half-life. He recently started amlodipine 5 mg daily. He also takes hydrochlorothiazide  25 mg daily and metoprolol  tartrate 50 mg twice daily. He uses propranolol  as needed for episodes of 'runaway heartbeat.'  He experienced an episode of elevated blood pressure and 'runaway heartbeat' on March 10, 2024, with blood pressure readings reaching 158/96 mmHg. He took additional losartan  and propranolol  and went to the ER, fearing a recurrence of his previous graft collapse. No chest pain during this episode. A nuclear stress test was performed.  He  has mild angina and shortness of breath with mild exertion, occurring randomly, including during sleep. He acknowledges not exercising enough but engages in activities like yard work.  He recalls a significant episode in 2019 when he felt extremely unwell without chest pain, leading to extensive testing, including for Lyme disease and brain tumor, before being referred to a cardiologist. A CT scan with angiogram eventually revealed multiple coronary artery blockages, leading to his CABG.          Problem List CAD s/p CABG 03/12/2019 -LIMA-LAD and SVG-PDA patent 08/31/2021 -occluded SVG-OM 2. VF arrest  -2/2 SVG-OM failure; no ICD 2. HTN 3. HLD -T chol 148, HDL 63, LDL 55, TG 150    ROS: All other ROS reviewed and negative. Pertinent positives noted in the HPI.     Studies Reviewed: .        NM Stress 03/18/2024   Findings are consistent with a small area of basal inferolateral infarction.  No ischemia. The study is overall low risk.   No ST deviation was noted.   LV perfusion is abnormal. There is no evidence of ischemia. There is evidence of infarction. Defect 1: There is a small defect with moderate reduction in uptake present in the basal inferolateral location(s) that is fixed. There is abnormal wall motion in the defect area. Consistent with infarction.   Left ventricular function is normal. Nuclear stress EF: 54%. End diastolic cavity size is normal. End systolic cavity size is normal. No evidence of transient ischemic dilation (TID) noted.   CT images  were obtained for attenuation correction and were examined for the presence of coronary calcium  when appropriate.   Coronary calcium  assessment not performed due to prior revascularization.   Prior study not available for comparison.  LHC 09/19/2021 1. Patent LIMA to LAD and vein graft to PDA; the vein graft to obtuse marginal was not imaged as it is known occluded. 2. Patent mid left circumflex stent.  3. Preserved LV function with  LVEDP of 6 to 8 mmHg.  Physical Exam:   VS:  BP 128/84   Pulse (!) 106   Ht 5' 10 (1.778 m)   Wt 184 lb 4.8 oz (83.6 kg)   SpO2 97%   BMI 26.44 kg/m    Wt Readings from Last 3 Encounters:  04/25/24 184 lb 4.8 oz (83.6 kg)  12/31/23 181 lb (82.1 kg)  04/11/23 181 lb 9.6 oz (82.4 kg)    GEN: Well nourished, well developed in no acute distress NECK: No JVD; No carotid bruits CARDIAC: RRR, no murmurs, rubs, gallops RESPIRATORY:  Clear to auscultation without rales, wheezing or rhonchi  ABDOMEN: Soft, non-tender, non-distended EXTREMITIES:  No edema; No deformity  ASSESSMENT AND PLAN: .   Assessment and Plan    Coronary artery disease status post CABG with prior MI and cardiac arrest Coronary artery disease with CABG in 2020, complicated by cardiac arrest due to occlusion of a vein graft to the circumflex artery. No ICD indicated post-MI. Recent nuclear stress test showed no new ischemia. Current management focuses on medication and monitoring. - Continue Plavix  75 mg daily. - Continue Imdur  30 mg daily. - Continue metoprolol  tartrate 50 mg BID. - Consider PET scan if reevaluation is needed in the future. He is suspicious of SPECT stress testing.   Mild angina and exertional dyspnea Reports mild angina and exertional dyspnea. No new ischemia noted on recent stress test. Symptoms managed medically. - Encourage regular exercise. - Monitor symptoms and report any changes.  Hypertension Long-standing hypertension with recent episodes of fluctuating blood pressure. Blood pressure management is ongoing with primary care involvement. - Continue olmesartan  40 mg daily (20 mg BID). - Continue amlodipine 5 mg daily. - Continue hydrochlorothiazide  25 mg daily. - Continue metoprolol  tartrate 50 mg BID. - Use propranolol  10 mg as needed. - Write letter to support insurance coverage for ER visit. - Monitor blood pressure closely.  Hyperlipidemia Cholesterol levels at goal with current  statin therapy. Recent LDL cholesterol was 68 mg/dL. - Continue current statin therapy (Lipitor 20 mg).              Follow-up: Return in about 6 months (around 10/24/2024).   Signed, Darryle DASEN. Barbaraann, MD, [facility]  [facility]  [address] [place], [place] [address] [phone]  4:54 PM

## 2024-04-25 ENCOUNTER — Other Ambulatory Visit (HOSPITAL_BASED_OUTPATIENT_CLINIC_OR_DEPARTMENT_OTHER): Payer: Self-pay

## 2024-04-25 ENCOUNTER — Encounter: Payer: Self-pay | Admitting: Cardiovascular Disease

## 2024-04-25 ENCOUNTER — Ambulatory Visit: Attending: Cardiovascular Disease | Admitting: Cardiovascular Disease

## 2024-04-25 VITALS — BP 128/84 | HR 106 | Ht 70.0 in | Wt 184.3 lb

## 2024-04-25 DIAGNOSIS — I150 Renovascular hypertension: Secondary | ICD-10-CM | POA: Diagnosis not present

## 2024-04-25 DIAGNOSIS — E782 Mixed hyperlipidemia: Secondary | ICD-10-CM

## 2024-04-25 DIAGNOSIS — I25810 Atherosclerosis of coronary artery bypass graft(s) without angina pectoris: Secondary | ICD-10-CM | POA: Diagnosis not present

## 2024-04-25 MED ORDER — COMIRNATY 30 MCG/0.3ML IM SUSY
0.3000 mL | PREFILLED_SYRINGE | Freq: Once | INTRAMUSCULAR | 0 refills | Status: AC
  Filled 2024-04-25: qty 0.3, 1d supply, fill #0

## 2024-04-25 MED ORDER — FLUZONE HIGH-DOSE 0.5 ML IM SUSY
0.5000 mL | PREFILLED_SYRINGE | Freq: Once | INTRAMUSCULAR | 0 refills | Status: AC
  Filled 2024-04-25: qty 0.5, 1d supply, fill #0

## 2024-04-25 NOTE — Patient Instructions (Addendum)
 Medication Instructions:  Your physician recommends that you continue on your current medications as directed. Please refer to the Current Medication list given to you today.  *If you need a refill on your cardiac medications before your next appointment, please call your pharmacy*  Lab Work: none If you have labs (blood work) drawn today and your tests are completely normal, you will receive your results only by: MyChart Message (if you have MyChart) OR A paper copy in the mail If you have any lab test that is abnormal or we need to change your treatment, we will call you to review the results.  Testing/Procedures: none  Follow-Up: At [facility], you and your health needs are our priority.  As part of our continuing mission to provide you with exceptional heart care, our providers are all part of one team.  This team includes your primary Cardiologist (physician) and Advanced Practice Providers or APPs (Physician Assistants and Nurse Practitioners) who all work together to provide you with the care you need, when you need it.  Your next appointment:   1 year  Provider:   Dr. Barbaraann  We recommend signing up for the patient portal called MyChart.  Sign up information is provided on this After Visit Summary.  MyChart is used to connect with patients for Virtual Visits (Telemedicine).  Patients are able to view lab/test results, encounter notes, upcoming appointments, etc.  Non-urgent messages can be sent to your provider as well.   To learn more about what you can do with MyChart, go to [email or web address].   Other Instructions none

## 2024-05-19 ENCOUNTER — Other Ambulatory Visit: Payer: Self-pay

## 2024-05-20 DIAGNOSIS — I10 Essential (primary) hypertension: Secondary | ICD-10-CM | POA: Diagnosis not present

## 2024-05-20 DIAGNOSIS — Z23 Encounter for immunization: Secondary | ICD-10-CM | POA: Diagnosis not present

## 2024-05-20 MED ORDER — CLOPIDOGREL BISULFATE 75 MG PO TABS: 75.0000 mg | ORAL_TABLET | Freq: Every day | ORAL | 3 refills | Status: AC

## 2024-05-23 ENCOUNTER — Ambulatory Visit (HOSPITAL_COMMUNITY)
Admission: RE | Admit: 2024-05-23 | Discharge: 2024-05-23 | Disposition: A | Discharge status: Home / Self Care | Source: Ambulatory Visit | Attending: Vascular Surgery | Admitting: Vascular Surgery

## 2024-05-23 ENCOUNTER — Ambulatory Visit (INDEPENDENT_AMBULATORY_CARE_PROVIDER_SITE_OTHER): Admitting: Physician Assistant

## 2024-05-23 VITALS — BP 122/76 | HR 59 | Temp 97.9°F | Wt 183.8 lb

## 2024-05-23 DIAGNOSIS — I6523 Occlusion and stenosis of bilateral carotid arteries: Secondary | ICD-10-CM | POA: Insufficient documentation

## 2024-05-23 NOTE — Progress Notes (Signed)
 Office Note     CC:  follow up Requesting Provider:  Clarice Nottingham, MD  HPI: Jared Roberts is a 78 y.o. (25-Jun-1946) male who presents for surveillance follow up of carotid artery stenosis. He has no history of TIA or stroke. We have been following his 1-39% carotid stenosis since 2020 that was identified on workup for his CABG.   Patient is here with his wife today. He overall says he has been feeling good. He recently was having labile blood pressures and heart rates but has had some adjustments in his antihypertensive regimen and it seems to have been resolved. He otherwise says he just has the normal aches and pains of ageing. He denies any amaurosis fugax or visual changes, speech difficulties, facial drooping, or weakness/numbness of his upper or lower extremities.   He is closely followed by Cardiology with history of CAD with CABG x 3 in August 2020. He has had multiple cardiac cath's with interventions since his CABG.   The pt is on a statin for cholesterol management.  The pt is on a daily aspirin . Other AC:  Plavix  The pt is on BB for hypertension.   The pt is not diabetic.   Tobacco hx:  never  Past Medical History:  Diagnosis Date   Carotid artery disease    CKD (chronic kidney disease), stage III (HCC)    Coronary artery disease    s/p CABG 02/2009 with  LIMA-LAD, SVG-OM, SVG-PDA, NSTEMI/VF arrest 03/2019 felt due to early graft failure s/p PTCA to distal Cx/OM2, then repeat PCI later 03/2019 with DES to [facility] and PTCA to OM branch   Cranial nerve VI palsy    DDD (degenerative disc disease), cervical    Depression    Dilated aortic root    ED (erectile dysfunction) of organic origin    Generalized osteoarthritis    GERD (gastroesophageal reflux disease)    Hiatal hernia    History of chronic bronchitis    History of gastritis    2003   History of Helicobacter pylori infection    2003   History of melanoma excision    2015--  right ear   History of prostate cancer  urologist-  dr renda   2007--  pT2a Nx Mx,  Gleason 3+3, S/P  PROSTATECTOMY   History of squamous cell carcinoma excision    2011  left knee   Hyperlipidemia    Hyperplastic colon polyp 2009   Hypertension    Male hypogonadism    Melanoma (HCC)    left 4th toe   Nephrolithiasis    Prostate cancer (HCC)    Sinus bradycardia    Ventricular fibrillation (HCC) 04/07/2019   VF, en route witnessed by EMS, defibrillated x 1    Past Surgical History:  Procedure Laterality Date   AMPUTATION TOE Left 09/07/2015   Procedure: PARTIAL AMPUTATION TOE 4TH LEFT;  Surgeon: Wendi Hai, MD;  Location: [facility];  Service: Podiatry;  Laterality: Left;   APPENDECTOMY  1970's   CORONARY ARTERY BYPASS GRAFT N/A 03/12/2019   Procedure: CORONARY ARTERY BYPASS GRAFTING (CABG) x 3 WITH ENDOSCOPIC HARVESTING OF RIGHT GREATER SAPHENOUS VEIN;  Surgeon: Shyrl Linnie KIDD, MD;  Location: [facility] OR;  Service: Open Heart Surgery;  Laterality: N/A;   CORONARY BALLOON ANGIOPLASTY N/A 04/07/2019   Procedure: CORONARY BALLOON ANGIOPLASTY;  Surgeon: Wonda Sharper, MD;  Location: [facility] INVASIVE CV LAB;  Service: Cardiovascular;  Laterality: N/A;   CORONARY STENT INTERVENTION N/A 04/21/2019   Procedure:  CORONARY STENT INTERVENTION;  Surgeon: Verlin Lonni BIRCH, MD;  Location: [facility] INVASIVE CV LAB;  Service: Cardiovascular;  Laterality: N/A;   CYSTO/  CLOT EVACUATION POST PROSTATECTOMY  11-19-2005   CYSTO/  REMOVAL URETHRAL STONE AND URETHRAL STAPLES  06-02-2010   CYSTO/  URETEROSCOPIC STONE EXTRACTION  X2  1990's   KIDNEY STONE SURGERY     LEFT HEART CATH AND CORONARY ANGIOGRAPHY N/A 02/25/2019   Procedure: LEFT HEART CATH AND CORONARY ANGIOGRAPHY;  Surgeon: Darron Deatrice LABOR, MD;  Location: [facility] INVASIVE CV LAB;  Service: Cardiovascular;  Laterality: N/A;   LEFT HEART CATH AND CORS/GRAFTS ANGIOGRAPHY N/A 04/07/2019   Procedure: LEFT HEART CATH AND CORS/GRAFTS ANGIOGRAPHY;  Surgeon: Wonda Sharper, MD;   Location: [facility] INVASIVE CV LAB;  Service: Cardiovascular;  Laterality: N/A;   LEFT HEART CATH AND CORS/GRAFTS ANGIOGRAPHY N/A 04/21/2019   Procedure: LEFT HEART CATH AND CORS/GRAFTS ANGIOGRAPHY;  Surgeon: Verlin Lonni BIRCH, MD;  Location: [facility] INVASIVE CV LAB;  Service: Cardiovascular;  Laterality: N/A;   LEFT HEART CATH AND CORS/GRAFTS ANGIOGRAPHY N/A 08/31/2021   Procedure: LEFT HEART CATH AND CORS/GRAFTS ANGIOGRAPHY;  Surgeon: Wendel Lurena POUR, MD;  Location: [facility] INVASIVE CV LAB;  Service: Cardiovascular;  Laterality: N/A;   MELANOMA EXCISION  2015   right ear   ROBOT ASSISTED LAPAROSCOPIC RADICAL PROSTATECTOMY  11-09-2005   SKIN CANCER EXCISION     TEE WITHOUT CARDIOVERSION N/A 03/12/2019   Procedure: TRANSESOPHAGEAL ECHOCARDIOGRAM (TEE);  Surgeon: Shyrl Linnie KIDD, MD;  Location: [facility] OR;  Service: Open Heart Surgery;  Laterality: N/A;   TONSILLECTOMY  child    Social History   Socioeconomic History   Marital status: Married    Spouse name: Candis   Number of children: 1   Years of education: 18   Highest education level: Master's degree (e.g., MA, MS, MEng, MEd, MSW, MBA)  Occupational History   Occupation: retired    Comment: from geographical information systems officer  Tobacco Use   Smoking status: Never   Smokeless tobacco: Never  Vaping Use   Vaping status: Never Used  Substance and Sexual Activity   Alcohol use: Yes    Comment: 1 glass of wine daily   Drug use: No   Sexual activity: Not on file    Comment: retired, married. 1 son  Other Topics Concern   Not on file  Social History Narrative   Not on file   Social Drivers of Health   Financial Resource Strain: Low Risk  (05/20/2019)   Overall Financial Resource Strain (CARDIA)    Difficulty of Paying Living Expenses: Not hard at all  Food Insecurity: No Food Insecurity (05/20/2019)   Hunger Vital Sign    Worried About Running Out of Food in the Last Year: Never true    Ran Out of Food in the Last Year: Never true  Transportation  Needs: No Transportation Needs (05/20/2019)   PRAPARE - Administrator, Civil Service (Medical): No    Lack of Transportation (Non-Medical): No  Physical Activity: Inactive (05/20/2019)   Exercise Vital Sign    Days of Exercise per Week: 0 days    Minutes of Exercise per Session: 0 min  Stress: No Stress Concern Present (05/20/2019)   [employer] of Occupational Health - Occupational Stress Questionnaire    Feeling of Stress : Not at all  Social Connections: Moderately Integrated (05/20/2019)   Social Connection and Isolation Panel    Frequency of Communication with Friends and Family: More than three times a week  Frequency of Social Gatherings with Friends and Family: Once a week    Attends Religious Services: 1 to 4 times per year    Active Member of [employer] or Organizations: No    Attends Banker Meetings: Never    Marital Status: Married  Catering Manager Violence: Unknown (05/20/2019)   Humiliation, Afraid, Rape, and Kick questionnaire    Fear of Current or Ex-Partner: Patient declined    Emotionally Abused: Patient declined    Physically Abused: Patient declined    Sexually Abused: Patient declined    Family History  Problem Relation Age of Onset   Arthritis Mother    Diabetes Mellitus II Mother    Congestive Heart Failure Mother    Osteoarthritis Mother        CHRONIC   Other Father        KILLED IN WWII    Current Outpatient Medications  Medication Sig Dispense Refill   ALPHA LIPOIC ACID PO Take 1 tablet by mouth daily.     amLODipine (NORVASC) 5 MG tablet Take 5 mg by mouth daily.     Ascorbic Acid (VITAMIN C PO) Take 1 tablet by mouth daily.     atorvastatin  (LIPITOR) 20 MG tablet Take 1 tablet (20 mg total) by mouth daily. 90 tablet 3   calcium  carbonate (TUMS - DOSED IN MG ELEMENTAL CALCIUM ) 500 MG chewable tablet Chew 2 tablets by mouth as needed for indigestion or heartburn.      Cholecalciferol (VITAMIN D ) 2000 units CAPS Take  2,000 Units by mouth 2 (two) times a day.      CHROMIUM PO Take by mouth daily.     clopidogrel  (PLAVIX ) 75 MG tablet Take 1 tablet (75 mg total) by mouth daily. 90 tablet 3   Fish Oil-Cholecalciferol (FISH OIL + D3 PO) Take 1 capsule by mouth daily.     hydrochlorothiazide  (HYDRODIURIL ) 25 MG tablet Take 1 tablet (25 mg total) by mouth daily. 90 tablet 3   isosorbide  mononitrate (IMDUR ) 30 MG 24 hr tablet Take 1 tablet (30 mg total) by mouth in the morning and at bedtime. 180 tablet 3   MAGNESIUM  PO Take 300 mg by mouth daily.     metoprolol  tartrate (LOPRESSOR ) 50 MG tablet Take 1 tablet (50 mg total) by mouth 2 (two) times daily. 180 tablet 3   Multiple Vitamin (MULTIVITAMIN WITH MINERALS) TABS tablet Take 1 tablet by mouth daily.     nitroGLYCERIN  (NITROSTAT ) 0.4 MG SL tablet DISSOLVE 1 TAB UNDER TONGUE FOR CHEST PAIN - IF PAIN REMAINS AFTER 5 MIN, CALL 911 AND REPEAT DOSE. MAX 3 TABS IN 15 MINUTES 25 tablet 7   olmesartan  (BENICAR ) 40 MG tablet Take 40 mg by mouth daily.     pantoprazole  (PROTONIX ) 20 MG tablet Take 20 mg by mouth every other day.     potassium chloride  (KLOR-CON ) 10 MEQ tablet Take 1 tablet (10 mEq total) by mouth daily. 90 tablet 2   propranolol  (INDERAL ) 10 MG tablet Take 10 mg by mouth as needed (for rapid heatbeat).     No current facility-administered medications for this visit.    No Known Allergies   REVIEW OF SYSTEMS:  Negative unless noted in HPI [X]  denotes positive finding, [ ]  denotes negative finding Cardiac  Comments:  Chest pain or chest pressure:    Shortness of breath upon exertion:    Short of breath when lying flat:    Irregular heart rhythm:  Vascular    Pain in calf, thigh, or hip brought on by ambulation:    Pain in feet at night that wakes you up from your sleep:     Blood clot in your veins:    Leg swelling:         Pulmonary    Oxygen  at home:    Productive cough:     Wheezing:         Neurologic    Sudden weakness in  arms or legs:     Sudden numbness in arms or legs:     Sudden onset of difficulty speaking or slurred speech:    Temporary loss of vision in one eye:     Problems with dizziness:         Gastrointestinal    Blood in stool:     Vomited blood:         Genitourinary    Burning when urinating:     Blood in urine:        Psychiatric    Major depression:         Hematologic    Bleeding problems:    Problems with blood clotting too easily:        Skin    Rashes or ulcers:        Constitutional    Fever or chills:      PHYSICAL EXAMINATION:  Vitals:   05/23/24 1250 05/23/24 1251  BP: 129/82 122/76  Pulse: (!) 59 (!) 59  Temp: 97.9 F (36.6 C)   TempSrc: Temporal   Weight: 183 lb 12.8 oz (83.4 kg)     General:  WDWN in NAD; vital signs documented above Gait: Normal HENT: WNL, normocephalic Pulmonary: normal non-labored breathing Cardiac: regular HR; no carotid bruit Abdomen: soft Vascular Exam/Pulses: 2+ radial, 2+ DP pulses bilaterally. Feet warm and well perfused Extremities: without ischemic changes, without Gangrene , without cellulitis; without open wounds;  Musculoskeletal: no muscle wasting or atrophy  Neurologic: A&O X 3 Psychiatric:  The pt has Normal affect.   Non-Invasive Vascular Imaging:   VAS US  Carotid: Summary:  Right Carotid: The extracranial vessels were near-normal with only minimal wall thickening or plaque.   Left Carotid: The extracranial vessels were near-normal with only minimal wall thickening or plaque.   Vertebrals:  Bilateral vertebral arteries demonstrate antegrade flow.  Subclavians: Normal flow hemodynamics were seen in bilateral subclavian arteries.    ASSESSMENT/PLAN:: 78 y.o. male here for follow up for carotid artery stenosis. He is without any TIA or Stroke like symptoms.  - Duplex shows normal carotid arteries bilaterally without stenosis. Normal vertebral and subclavian arteries - continue Aspirin , Statin and Plavix  - he  will follow up again in 2 years with repeat carotid duplex   Teretha Damme, PA-C [facility] [phone]  Clinic MD:   [place]
